# Patient Record
Sex: Female | Born: 1950 | Race: White | Hispanic: No | Smoking: Current every day smoker
Health system: Southern US, Community
[De-identification: ages and names within clinical notes are randomized; demographics above are authoritative.]

## PROBLEM LIST (undated history)

## (undated) ENCOUNTER — Emergency Department (HOSPITAL_COMMUNITY): Admission: EM | Payer: Medicare Other | Source: Home / Self Care

## (undated) DIAGNOSIS — Z22322 Carrier or suspected carrier of Methicillin resistant Staphylococcus aureus: Secondary | ICD-10-CM

## (undated) DIAGNOSIS — G4700 Insomnia, unspecified: Secondary | ICD-10-CM

## (undated) DIAGNOSIS — J410 Simple chronic bronchitis: Secondary | ICD-10-CM

## (undated) DIAGNOSIS — Z972 Presence of dental prosthetic device (complete) (partial): Secondary | ICD-10-CM

## (undated) DIAGNOSIS — R3915 Urgency of urination: Secondary | ICD-10-CM

## (undated) DIAGNOSIS — IMO0002 Reserved for concepts with insufficient information to code with codable children: Secondary | ICD-10-CM

## (undated) DIAGNOSIS — F419 Anxiety disorder, unspecified: Secondary | ICD-10-CM

## (undated) DIAGNOSIS — Z789 Other specified health status: Secondary | ICD-10-CM

## (undated) DIAGNOSIS — F1410 Cocaine abuse, uncomplicated: Secondary | ICD-10-CM

## (undated) DIAGNOSIS — K219 Gastro-esophageal reflux disease without esophagitis: Secondary | ICD-10-CM

## (undated) DIAGNOSIS — F329 Major depressive disorder, single episode, unspecified: Secondary | ICD-10-CM

## (undated) DIAGNOSIS — K259 Gastric ulcer, unspecified as acute or chronic, without hemorrhage or perforation: Secondary | ICD-10-CM

## (undated) DIAGNOSIS — K5730 Diverticulosis of large intestine without perforation or abscess without bleeding: Secondary | ICD-10-CM

## (undated) DIAGNOSIS — F39 Unspecified mood [affective] disorder: Secondary | ICD-10-CM

## (undated) DIAGNOSIS — R05 Cough: Secondary | ICD-10-CM

## (undated) DIAGNOSIS — M1990 Unspecified osteoarthritis, unspecified site: Secondary | ICD-10-CM

## (undated) DIAGNOSIS — J449 Chronic obstructive pulmonary disease, unspecified: Secondary | ICD-10-CM

## (undated) DIAGNOSIS — E785 Hyperlipidemia, unspecified: Secondary | ICD-10-CM

## (undated) DIAGNOSIS — F32A Depression, unspecified: Secondary | ICD-10-CM

## (undated) DIAGNOSIS — R300 Dysuria: Secondary | ICD-10-CM

## (undated) DIAGNOSIS — J189 Pneumonia, unspecified organism: Secondary | ICD-10-CM

## (undated) DIAGNOSIS — M87051 Idiopathic aseptic necrosis of right femur: Secondary | ICD-10-CM

## (undated) DIAGNOSIS — K2270 Barrett's esophagus without dysplasia: Secondary | ICD-10-CM

## (undated) DIAGNOSIS — R0981 Nasal congestion: Secondary | ICD-10-CM

## (undated) DIAGNOSIS — N3010 Interstitial cystitis (chronic) without hematuria: Secondary | ICD-10-CM

## (undated) DIAGNOSIS — G2581 Restless legs syndrome: Secondary | ICD-10-CM

## (undated) DIAGNOSIS — R351 Nocturia: Secondary | ICD-10-CM

## (undated) DIAGNOSIS — F1911 Other psychoactive substance abuse, in remission: Secondary | ICD-10-CM

## (undated) DIAGNOSIS — T884XXA Failed or difficult intubation, initial encounter: Secondary | ICD-10-CM

## (undated) DIAGNOSIS — Z0282 Encounter for adoption services: Secondary | ICD-10-CM

## (undated) DIAGNOSIS — E7800 Pure hypercholesterolemia, unspecified: Secondary | ICD-10-CM

## (undated) DIAGNOSIS — Z8719 Personal history of other diseases of the digestive system: Secondary | ICD-10-CM

## (undated) DIAGNOSIS — T4145XA Adverse effect of unspecified anesthetic, initial encounter: Secondary | ICD-10-CM

## (undated) DIAGNOSIS — R102 Pelvic and perineal pain: Secondary | ICD-10-CM

## (undated) DIAGNOSIS — R569 Unspecified convulsions: Secondary | ICD-10-CM

## (undated) DIAGNOSIS — R0600 Dyspnea, unspecified: Secondary | ICD-10-CM

## (undated) DIAGNOSIS — Z8711 Personal history of peptic ulcer disease: Secondary | ICD-10-CM

## (undated) DIAGNOSIS — Z915 Personal history of self-harm: Secondary | ICD-10-CM

## (undated) DIAGNOSIS — G40909 Epilepsy, unspecified, not intractable, without status epilepticus: Secondary | ICD-10-CM

## (undated) DIAGNOSIS — F1011 Alcohol abuse, in remission: Secondary | ICD-10-CM

## (undated) DIAGNOSIS — R058 Other specified cough: Secondary | ICD-10-CM

## (undated) DIAGNOSIS — Z87828 Personal history of other (healed) physical injury and trauma: Secondary | ICD-10-CM

## (undated) DIAGNOSIS — R350 Frequency of micturition: Secondary | ICD-10-CM

## (undated) DIAGNOSIS — J069 Acute upper respiratory infection, unspecified: Secondary | ICD-10-CM

## (undated) DIAGNOSIS — Z8614 Personal history of Methicillin resistant Staphylococcus aureus infection: Secondary | ICD-10-CM

## (undated) HISTORY — PX: INCISION AND DRAINAGE: SHX5863

## (undated) HISTORY — PX: MULTIPLE TOOTH EXTRACTIONS: SHX2053

## (undated) HISTORY — DX: Acute upper respiratory infection, unspecified: J06.9

## (undated) HISTORY — DX: Anxiety disorder, unspecified: F41.9

## (undated) HISTORY — PX: INTERSTIM IMPLANT REMOVAL: SHX5131

## (undated) HISTORY — DX: Gastro-esophageal reflux disease without esophagitis: K21.9

## (undated) HISTORY — DX: Pneumonia, unspecified organism: J18.9

## (undated) HISTORY — DX: Restless legs syndrome: G25.81

## (undated) HISTORY — PX: JOINT REPLACEMENT: SHX530

## (undated) HISTORY — PX: OTHER SURGICAL HISTORY: SHX169

## (undated) HISTORY — PX: SALPINGOOPHORECTOMY: SHX82

## (undated) HISTORY — PX: VAGINAL HYSTERECTOMY: SUR661

## (undated) HISTORY — PX: SINOSCOPY: SHX187

## (undated) HISTORY — PX: NEUROPLASTY BRACHIAL PLEXUS: SUR896

## (undated) HISTORY — PX: NASAL SEPTUM SURGERY: SHX37

---

## 1977-04-18 DIAGNOSIS — Z87828 Personal history of other (healed) physical injury and trauma: Secondary | ICD-10-CM

## 1977-04-18 HISTORY — DX: Personal history of other (healed) physical injury and trauma: Z87.828

## 1988-04-18 DIAGNOSIS — F519 Sleep disorder not due to a substance or known physiological condition, unspecified: Secondary | ICD-10-CM | POA: Insufficient documentation

## 1997-07-14 ENCOUNTER — Ambulatory Visit (HOSPITAL_COMMUNITY): Admission: RE | Admit: 1997-07-14 | Discharge: 1997-07-14 | Payer: Self-pay | Admitting: *Deleted

## 1997-08-29 ENCOUNTER — Other Ambulatory Visit: Admission: RE | Admit: 1997-08-29 | Discharge: 1997-08-29 | Payer: Self-pay | Admitting: *Deleted

## 1997-10-01 ENCOUNTER — Ambulatory Visit: Admission: RE | Admit: 1997-10-01 | Discharge: 1997-10-01 | Payer: Self-pay | Admitting: *Deleted

## 1997-10-12 ENCOUNTER — Inpatient Hospital Stay (HOSPITAL_COMMUNITY): Admission: AD | Admit: 1997-10-12 | Discharge: 1997-10-12 | Payer: Self-pay | Admitting: *Deleted

## 1997-12-31 ENCOUNTER — Observation Stay (HOSPITAL_COMMUNITY): Admission: RE | Admit: 1997-12-31 | Discharge: 1998-01-01 | Payer: Self-pay | Admitting: *Deleted

## 1998-05-15 ENCOUNTER — Other Ambulatory Visit: Admission: RE | Admit: 1998-05-15 | Discharge: 1998-05-15 | Payer: Self-pay | Admitting: *Deleted

## 1998-07-03 ENCOUNTER — Ambulatory Visit (HOSPITAL_COMMUNITY): Admission: RE | Admit: 1998-07-03 | Discharge: 1998-07-03 | Payer: Self-pay | Admitting: Psychiatry

## 1998-07-15 ENCOUNTER — Ambulatory Visit (HOSPITAL_COMMUNITY): Admission: RE | Admit: 1998-07-15 | Discharge: 1998-07-15 | Payer: Self-pay | Admitting: *Deleted

## 1998-07-15 ENCOUNTER — Encounter: Payer: Self-pay | Admitting: *Deleted

## 1998-08-26 ENCOUNTER — Emergency Department (HOSPITAL_COMMUNITY): Admission: EM | Admit: 1998-08-26 | Discharge: 1998-08-26 | Payer: Self-pay | Admitting: Emergency Medicine

## 1999-01-14 ENCOUNTER — Inpatient Hospital Stay (HOSPITAL_COMMUNITY): Admission: EM | Admit: 1999-01-14 | Discharge: 1999-01-14 | Payer: Self-pay | Admitting: Family Medicine

## 2000-11-08 ENCOUNTER — Encounter: Payer: Self-pay | Admitting: Emergency Medicine

## 2000-11-08 ENCOUNTER — Emergency Department (HOSPITAL_COMMUNITY): Admission: EM | Admit: 2000-11-08 | Discharge: 2000-11-08 | Payer: Self-pay | Admitting: Emergency Medicine

## 2001-01-09 ENCOUNTER — Emergency Department (HOSPITAL_COMMUNITY): Admission: EM | Admit: 2001-01-09 | Discharge: 2001-01-09 | Payer: Self-pay | Admitting: Emergency Medicine

## 2001-01-09 ENCOUNTER — Encounter: Payer: Self-pay | Admitting: Emergency Medicine

## 2001-01-22 ENCOUNTER — Emergency Department (HOSPITAL_COMMUNITY): Admission: EM | Admit: 2001-01-22 | Discharge: 2001-01-22 | Payer: Self-pay

## 2002-08-04 ENCOUNTER — Emergency Department (HOSPITAL_COMMUNITY): Admission: EM | Admit: 2002-08-04 | Discharge: 2002-08-04 | Payer: Self-pay | Admitting: Emergency Medicine

## 2002-11-01 ENCOUNTER — Emergency Department (HOSPITAL_COMMUNITY): Admission: EM | Admit: 2002-11-01 | Discharge: 2002-11-01 | Payer: Self-pay | Admitting: Emergency Medicine

## 2002-11-02 ENCOUNTER — Emergency Department (HOSPITAL_COMMUNITY): Admission: EM | Admit: 2002-11-02 | Discharge: 2002-11-02 | Payer: Self-pay | Admitting: Emergency Medicine

## 2002-11-03 ENCOUNTER — Inpatient Hospital Stay (HOSPITAL_COMMUNITY): Admission: EM | Admit: 2002-11-03 | Discharge: 2002-11-07 | Payer: Self-pay | Admitting: Emergency Medicine

## 2002-11-03 ENCOUNTER — Encounter: Payer: Self-pay | Admitting: Family Medicine

## 2002-11-12 ENCOUNTER — Emergency Department (HOSPITAL_COMMUNITY): Admission: EM | Admit: 2002-11-12 | Discharge: 2002-11-12 | Payer: Self-pay | Admitting: Emergency Medicine

## 2003-07-13 ENCOUNTER — Emergency Department (HOSPITAL_COMMUNITY): Admission: EM | Admit: 2003-07-13 | Discharge: 2003-07-13 | Payer: Self-pay | Admitting: Emergency Medicine

## 2003-07-14 ENCOUNTER — Emergency Department (HOSPITAL_COMMUNITY): Admission: EM | Admit: 2003-07-14 | Discharge: 2003-07-14 | Payer: Self-pay | Admitting: Emergency Medicine

## 2003-12-31 ENCOUNTER — Ambulatory Visit: Payer: Self-pay | Admitting: Family Medicine

## 2004-01-13 ENCOUNTER — Ambulatory Visit: Payer: Self-pay | Admitting: Family Medicine

## 2004-01-13 ENCOUNTER — Encounter (INDEPENDENT_AMBULATORY_CARE_PROVIDER_SITE_OTHER): Payer: Self-pay | Admitting: Internal Medicine

## 2004-01-13 LAB — CONVERTED CEMR LAB
HCT: 46.8 %
Hemoglobin: 15.9 g/dL

## 2004-02-08 ENCOUNTER — Emergency Department (HOSPITAL_COMMUNITY): Admission: EM | Admit: 2004-02-08 | Discharge: 2004-02-08 | Payer: Self-pay | Admitting: Emergency Medicine

## 2004-02-11 ENCOUNTER — Emergency Department (HOSPITAL_COMMUNITY): Admission: EM | Admit: 2004-02-11 | Discharge: 2004-02-11 | Payer: Self-pay | Admitting: Emergency Medicine

## 2004-02-18 ENCOUNTER — Emergency Department (HOSPITAL_COMMUNITY): Admission: EM | Admit: 2004-02-18 | Discharge: 2004-02-18 | Payer: Self-pay | Admitting: Emergency Medicine

## 2004-02-25 ENCOUNTER — Emergency Department (HOSPITAL_COMMUNITY): Admission: EM | Admit: 2004-02-25 | Discharge: 2004-02-25 | Payer: Self-pay | Admitting: Family Medicine

## 2004-04-06 ENCOUNTER — Encounter (INDEPENDENT_AMBULATORY_CARE_PROVIDER_SITE_OTHER): Payer: Self-pay | Admitting: *Deleted

## 2004-04-06 ENCOUNTER — Ambulatory Visit (HOSPITAL_COMMUNITY): Admission: RE | Admit: 2004-04-06 | Discharge: 2004-04-06 | Payer: Self-pay | Admitting: Gastroenterology

## 2004-05-05 ENCOUNTER — Encounter (INDEPENDENT_AMBULATORY_CARE_PROVIDER_SITE_OTHER): Payer: Self-pay | Admitting: Specialist

## 2004-05-05 ENCOUNTER — Ambulatory Visit (HOSPITAL_COMMUNITY): Admission: RE | Admit: 2004-05-05 | Discharge: 2004-05-05 | Payer: Self-pay | Admitting: Gastroenterology

## 2004-09-17 ENCOUNTER — Emergency Department (HOSPITAL_COMMUNITY): Admission: EM | Admit: 2004-09-17 | Discharge: 2004-09-17 | Payer: Self-pay | Admitting: Emergency Medicine

## 2004-10-25 ENCOUNTER — Emergency Department (HOSPITAL_COMMUNITY): Admission: EM | Admit: 2004-10-25 | Discharge: 2004-10-25 | Payer: Self-pay | Admitting: Emergency Medicine

## 2004-10-28 ENCOUNTER — Emergency Department (HOSPITAL_COMMUNITY): Admission: EM | Admit: 2004-10-28 | Discharge: 2004-10-28 | Payer: Self-pay | Admitting: Emergency Medicine

## 2005-02-16 ENCOUNTER — Emergency Department (HOSPITAL_COMMUNITY): Admission: EM | Admit: 2005-02-16 | Discharge: 2005-02-16 | Payer: Self-pay | Admitting: Emergency Medicine

## 2005-05-02 ENCOUNTER — Emergency Department (HOSPITAL_COMMUNITY): Admission: EM | Admit: 2005-05-02 | Discharge: 2005-05-02 | Payer: Self-pay | Admitting: Emergency Medicine

## 2005-05-05 ENCOUNTER — Emergency Department (HOSPITAL_COMMUNITY): Admission: EM | Admit: 2005-05-05 | Discharge: 2005-05-05 | Payer: Self-pay | Admitting: Emergency Medicine

## 2005-08-29 ENCOUNTER — Ambulatory Visit: Payer: Self-pay | Admitting: Internal Medicine

## 2005-09-09 ENCOUNTER — Ambulatory Visit: Payer: Self-pay | Admitting: Internal Medicine

## 2005-09-09 ENCOUNTER — Encounter: Payer: Self-pay | Admitting: Internal Medicine

## 2005-09-09 ENCOUNTER — Encounter (INDEPENDENT_AMBULATORY_CARE_PROVIDER_SITE_OTHER): Payer: Self-pay | Admitting: Internal Medicine

## 2005-09-09 LAB — CONVERTED CEMR LAB: Pap Smear: NORMAL

## 2005-10-04 ENCOUNTER — Ambulatory Visit (HOSPITAL_COMMUNITY): Admission: RE | Admit: 2005-10-04 | Discharge: 2005-10-04 | Payer: Self-pay | Admitting: Family Medicine

## 2005-10-05 ENCOUNTER — Emergency Department (HOSPITAL_COMMUNITY): Admission: EM | Admit: 2005-10-05 | Discharge: 2005-10-05 | Payer: Self-pay | Admitting: Emergency Medicine

## 2005-10-27 ENCOUNTER — Ambulatory Visit: Payer: Self-pay | Admitting: Family Medicine

## 2006-01-11 ENCOUNTER — Ambulatory Visit (HOSPITAL_BASED_OUTPATIENT_CLINIC_OR_DEPARTMENT_OTHER): Admission: RE | Admit: 2006-01-11 | Discharge: 2006-01-11 | Payer: Self-pay | Admitting: Urology

## 2006-02-13 ENCOUNTER — Emergency Department (HOSPITAL_COMMUNITY): Admission: EM | Admit: 2006-02-13 | Discharge: 2006-02-13 | Payer: Self-pay | Admitting: Emergency Medicine

## 2006-02-16 ENCOUNTER — Emergency Department (HOSPITAL_COMMUNITY): Admission: EM | Admit: 2006-02-16 | Discharge: 2006-02-16 | Payer: Self-pay | Admitting: Emergency Medicine

## 2006-05-29 ENCOUNTER — Ambulatory Visit: Payer: Self-pay | Admitting: Internal Medicine

## 2006-05-29 ENCOUNTER — Encounter (INDEPENDENT_AMBULATORY_CARE_PROVIDER_SITE_OTHER): Payer: Self-pay | Admitting: Internal Medicine

## 2006-05-29 LAB — CONVERTED CEMR LAB
Cholesterol: 239 mg/dL
Direct LDL: 160 mg/dL
HDL: 60 mg/dL
LDL Cholesterol: 125 mg/dL
Triglycerides: 268 mg/dL

## 2006-06-03 ENCOUNTER — Emergency Department (HOSPITAL_COMMUNITY): Admission: EM | Admit: 2006-06-03 | Discharge: 2006-06-03 | Payer: Self-pay | Admitting: Family Medicine

## 2006-06-13 ENCOUNTER — Emergency Department (HOSPITAL_COMMUNITY): Admission: EM | Admit: 2006-06-13 | Discharge: 2006-06-13 | Payer: Self-pay | Admitting: Emergency Medicine

## 2006-07-25 ENCOUNTER — Ambulatory Visit: Payer: Self-pay | Admitting: Family Medicine

## 2006-08-08 ENCOUNTER — Ambulatory Visit: Payer: Self-pay | Admitting: Family Medicine

## 2006-09-10 ENCOUNTER — Encounter (INDEPENDENT_AMBULATORY_CARE_PROVIDER_SITE_OTHER): Payer: Self-pay | Admitting: Internal Medicine

## 2006-09-10 LAB — CONVERTED CEMR LAB: Creatinine, Ser: 0.8 mg/dL

## 2006-10-02 ENCOUNTER — Ambulatory Visit: Payer: Self-pay | Admitting: Internal Medicine

## 2006-10-20 ENCOUNTER — Emergency Department (HOSPITAL_COMMUNITY): Admission: EM | Admit: 2006-10-20 | Discharge: 2006-10-20 | Payer: Self-pay | Admitting: Emergency Medicine

## 2006-12-11 ENCOUNTER — Encounter (INDEPENDENT_AMBULATORY_CARE_PROVIDER_SITE_OTHER): Payer: Self-pay | Admitting: Internal Medicine

## 2006-12-13 ENCOUNTER — Ambulatory Visit: Payer: Self-pay | Admitting: Internal Medicine

## 2006-12-13 DIAGNOSIS — N3010 Interstitial cystitis (chronic) without hematuria: Secondary | ICD-10-CM | POA: Insufficient documentation

## 2006-12-13 DIAGNOSIS — E782 Mixed hyperlipidemia: Secondary | ICD-10-CM | POA: Insufficient documentation

## 2006-12-13 DIAGNOSIS — F32A Depression, unspecified: Secondary | ICD-10-CM | POA: Insufficient documentation

## 2006-12-13 DIAGNOSIS — F329 Major depressive disorder, single episode, unspecified: Secondary | ICD-10-CM

## 2006-12-15 DIAGNOSIS — F1021 Alcohol dependence, in remission: Secondary | ICD-10-CM | POA: Insufficient documentation

## 2006-12-15 DIAGNOSIS — M5137 Other intervertebral disc degeneration, lumbosacral region: Secondary | ICD-10-CM | POA: Insufficient documentation

## 2006-12-15 DIAGNOSIS — Z860100 Personal history of colon polyps, unspecified: Secondary | ICD-10-CM | POA: Insufficient documentation

## 2006-12-15 DIAGNOSIS — K219 Gastro-esophageal reflux disease without esophagitis: Secondary | ICD-10-CM | POA: Insufficient documentation

## 2006-12-15 DIAGNOSIS — K5730 Diverticulosis of large intestine without perforation or abscess without bleeding: Secondary | ICD-10-CM | POA: Insufficient documentation

## 2006-12-15 DIAGNOSIS — J301 Allergic rhinitis due to pollen: Secondary | ICD-10-CM | POA: Insufficient documentation

## 2006-12-15 DIAGNOSIS — M51379 Other intervertebral disc degeneration, lumbosacral region without mention of lumbar back pain or lower extremity pain: Secondary | ICD-10-CM | POA: Insufficient documentation

## 2006-12-15 DIAGNOSIS — F17200 Nicotine dependence, unspecified, uncomplicated: Secondary | ICD-10-CM | POA: Insufficient documentation

## 2006-12-15 DIAGNOSIS — Z8719 Personal history of other diseases of the digestive system: Secondary | ICD-10-CM | POA: Insufficient documentation

## 2007-01-10 ENCOUNTER — Telehealth (INDEPENDENT_AMBULATORY_CARE_PROVIDER_SITE_OTHER): Payer: Self-pay | Admitting: Internal Medicine

## 2007-01-12 ENCOUNTER — Ambulatory Visit: Payer: Self-pay | Admitting: Internal Medicine

## 2007-01-17 ENCOUNTER — Emergency Department (HOSPITAL_COMMUNITY): Admission: EM | Admit: 2007-01-17 | Discharge: 2007-01-17 | Payer: Self-pay | Admitting: Emergency Medicine

## 2007-01-17 ENCOUNTER — Encounter (INDEPENDENT_AMBULATORY_CARE_PROVIDER_SITE_OTHER): Payer: Self-pay | Admitting: Internal Medicine

## 2007-01-21 ENCOUNTER — Telehealth (INDEPENDENT_AMBULATORY_CARE_PROVIDER_SITE_OTHER): Payer: Self-pay | Admitting: Internal Medicine

## 2007-01-22 LAB — CONVERTED CEMR LAB
ALT: 9 U/L
AST: 13 U/L
Albumin: 4.2 g/dL
Alkaline Phosphatase: 59 U/L
Bilirubin, Direct: 0.2 mg/dL
Cholesterol: 161 mg/dL
HDL: 62 mg/dL
Indirect Bilirubin: 0.2 mg/dL
LDL Cholesterol: 68 mg/dL
Total Bilirubin: 0.4 mg/dL
Total CHOL/HDL Ratio: 2.6
Total Protein: 6.7 g/dL
Triglycerides: 153 mg/dL — ABNORMAL HIGH
VLDL: 31 mg/dL

## 2007-02-06 ENCOUNTER — Encounter (INDEPENDENT_AMBULATORY_CARE_PROVIDER_SITE_OTHER): Payer: Self-pay | Admitting: Internal Medicine

## 2007-02-09 ENCOUNTER — Ambulatory Visit: Payer: Self-pay | Admitting: Internal Medicine

## 2007-02-09 DIAGNOSIS — N644 Mastodynia: Secondary | ICD-10-CM | POA: Insufficient documentation

## 2007-02-13 ENCOUNTER — Encounter (INDEPENDENT_AMBULATORY_CARE_PROVIDER_SITE_OTHER): Payer: Self-pay | Admitting: Internal Medicine

## 2007-02-14 ENCOUNTER — Ambulatory Visit (HOSPITAL_BASED_OUTPATIENT_CLINIC_OR_DEPARTMENT_OTHER): Admission: RE | Admit: 2007-02-14 | Discharge: 2007-02-14 | Payer: Self-pay | Admitting: Urology

## 2007-03-06 ENCOUNTER — Emergency Department (HOSPITAL_COMMUNITY): Admission: EM | Admit: 2007-03-06 | Discharge: 2007-03-06 | Payer: Self-pay | Admitting: Emergency Medicine

## 2007-03-07 ENCOUNTER — Telehealth (INDEPENDENT_AMBULATORY_CARE_PROVIDER_SITE_OTHER): Payer: Self-pay | Admitting: Internal Medicine

## 2007-03-08 ENCOUNTER — Emergency Department (HOSPITAL_COMMUNITY): Admission: EM | Admit: 2007-03-08 | Discharge: 2007-03-08 | Payer: Self-pay | Admitting: Emergency Medicine

## 2007-04-03 ENCOUNTER — Ambulatory Visit: Payer: Self-pay | Admitting: Internal Medicine

## 2007-04-03 DIAGNOSIS — L723 Sebaceous cyst: Secondary | ICD-10-CM | POA: Insufficient documentation

## 2007-04-03 DIAGNOSIS — L821 Other seborrheic keratosis: Secondary | ICD-10-CM | POA: Insufficient documentation

## 2007-04-06 ENCOUNTER — Telehealth (INDEPENDENT_AMBULATORY_CARE_PROVIDER_SITE_OTHER): Payer: Self-pay | Admitting: Internal Medicine

## 2007-04-10 ENCOUNTER — Encounter: Admission: RE | Admit: 2007-04-10 | Discharge: 2007-04-10 | Payer: Self-pay | Admitting: Obstetrics & Gynecology

## 2007-04-23 ENCOUNTER — Telehealth (INDEPENDENT_AMBULATORY_CARE_PROVIDER_SITE_OTHER): Payer: Self-pay | Admitting: Internal Medicine

## 2007-04-30 ENCOUNTER — Encounter (INDEPENDENT_AMBULATORY_CARE_PROVIDER_SITE_OTHER): Payer: Self-pay | Admitting: Nurse Practitioner

## 2007-05-10 ENCOUNTER — Encounter (INDEPENDENT_AMBULATORY_CARE_PROVIDER_SITE_OTHER): Payer: Self-pay | Admitting: Nurse Practitioner

## 2007-05-18 ENCOUNTER — Telehealth (INDEPENDENT_AMBULATORY_CARE_PROVIDER_SITE_OTHER): Payer: Self-pay | Admitting: *Deleted

## 2007-05-21 ENCOUNTER — Encounter (INDEPENDENT_AMBULATORY_CARE_PROVIDER_SITE_OTHER): Payer: Self-pay | Admitting: Nurse Practitioner

## 2007-05-24 ENCOUNTER — Encounter (INDEPENDENT_AMBULATORY_CARE_PROVIDER_SITE_OTHER): Payer: Self-pay | Admitting: *Deleted

## 2007-07-03 ENCOUNTER — Ambulatory Visit (HOSPITAL_BASED_OUTPATIENT_CLINIC_OR_DEPARTMENT_OTHER): Admission: RE | Admit: 2007-07-03 | Discharge: 2007-07-03 | Payer: Self-pay | Admitting: Urology

## 2007-07-17 ENCOUNTER — Ambulatory Visit (HOSPITAL_BASED_OUTPATIENT_CLINIC_OR_DEPARTMENT_OTHER): Admission: RE | Admit: 2007-07-17 | Discharge: 2007-07-17 | Payer: Self-pay | Admitting: Urology

## 2007-08-02 ENCOUNTER — Emergency Department (HOSPITAL_COMMUNITY): Admission: EM | Admit: 2007-08-02 | Discharge: 2007-08-02 | Payer: Self-pay | Admitting: Emergency Medicine

## 2008-02-14 ENCOUNTER — Inpatient Hospital Stay (HOSPITAL_COMMUNITY): Admission: EM | Admit: 2008-02-14 | Discharge: 2008-02-18 | Payer: Self-pay | Admitting: Emergency Medicine

## 2008-02-17 ENCOUNTER — Encounter (INDEPENDENT_AMBULATORY_CARE_PROVIDER_SITE_OTHER): Payer: Self-pay | Admitting: Internal Medicine

## 2008-02-18 ENCOUNTER — Ambulatory Visit: Payer: Self-pay | Admitting: Vascular Surgery

## 2008-02-18 ENCOUNTER — Encounter (INDEPENDENT_AMBULATORY_CARE_PROVIDER_SITE_OTHER): Payer: Self-pay | Admitting: Internal Medicine

## 2008-02-21 ENCOUNTER — Emergency Department (HOSPITAL_COMMUNITY): Admission: EM | Admit: 2008-02-21 | Discharge: 2008-02-22 | Payer: Self-pay | Admitting: Emergency Medicine

## 2008-03-03 ENCOUNTER — Ambulatory Visit (HOSPITAL_BASED_OUTPATIENT_CLINIC_OR_DEPARTMENT_OTHER): Admission: RE | Admit: 2008-03-03 | Discharge: 2008-03-03 | Payer: Self-pay | Admitting: Urology

## 2008-12-31 ENCOUNTER — Encounter: Admission: RE | Admit: 2008-12-31 | Discharge: 2008-12-31 | Payer: Self-pay | Admitting: Family Medicine

## 2009-05-01 ENCOUNTER — Emergency Department (HOSPITAL_COMMUNITY): Admission: EM | Admit: 2009-05-01 | Discharge: 2009-05-01 | Payer: Self-pay | Admitting: Emergency Medicine

## 2009-05-26 ENCOUNTER — Emergency Department (HOSPITAL_COMMUNITY): Admission: EM | Admit: 2009-05-26 | Discharge: 2009-05-26 | Payer: Self-pay | Admitting: Emergency Medicine

## 2009-09-19 ENCOUNTER — Emergency Department (HOSPITAL_COMMUNITY): Admission: EM | Admit: 2009-09-19 | Discharge: 2009-09-19 | Payer: Self-pay | Admitting: Emergency Medicine

## 2009-11-05 ENCOUNTER — Emergency Department (HOSPITAL_COMMUNITY): Admission: EM | Admit: 2009-11-05 | Discharge: 2009-11-05 | Payer: Self-pay | Admitting: Emergency Medicine

## 2009-11-07 ENCOUNTER — Emergency Department (HOSPITAL_COMMUNITY): Admission: EM | Admit: 2009-11-07 | Discharge: 2009-11-07 | Payer: Self-pay | Admitting: Emergency Medicine

## 2009-12-10 ENCOUNTER — Emergency Department (HOSPITAL_COMMUNITY): Admission: EM | Admit: 2009-12-10 | Discharge: 2009-12-10 | Payer: Self-pay | Admitting: Emergency Medicine

## 2009-12-20 ENCOUNTER — Emergency Department (HOSPITAL_COMMUNITY): Admission: EM | Admit: 2009-12-20 | Discharge: 2009-12-20 | Payer: Self-pay | Admitting: Emergency Medicine

## 2010-01-31 ENCOUNTER — Emergency Department (HOSPITAL_COMMUNITY): Admission: EM | Admit: 2010-01-31 | Discharge: 2010-01-31 | Payer: Self-pay | Admitting: Emergency Medicine

## 2010-04-18 HISTORY — PX: CATARACT EXTRACTION W/ INTRAOCULAR LENS  IMPLANT, BILATERAL: SHX1307

## 2010-05-09 ENCOUNTER — Encounter: Payer: Self-pay | Admitting: Obstetrics & Gynecology

## 2010-05-18 NOTE — Letter (Signed)
Summary: influenza vaccination  influenza vaccination   Imported By: Arta Bruce 02/13/2007 15:02:18  _____________________________________________________________________  External Attachment:    Type:   Image     Comment:   External Document

## 2010-05-18 NOTE — Letter (Signed)
Summary: TRIAGE CALL  TRIAGE CALL   Imported By: Arta Bruce 05/14/2007 09:24:05  _____________________________________________________________________  External Attachment:    Type:   Image     Comment:   External Document

## 2010-05-18 NOTE — Progress Notes (Signed)
  Phone Note Call from Patient   Summary of Call: On call triage nurse took call from pt.  for cough-call pt. and see if needs OV still--if still having symptoms. Initial call taken by: Julieanne Manson MD,  January 21, 2007 10:05 AM  Follow-up for Phone Call        Shanda Bumps spoke with pt.  She stated she is better.  She got a Z-pack and cough med from her doctor, Dr. Lloyd Huger.  She talked for about 20 minutes about how she was unhappy here at [facility] and she wanted her pain med refilled again.  Unable to fill as just got last week.  Pt has appt with Dr. Delrae Alfred on 10/24, encouraged her to keep that appt to address any concerns she has Follow-up by: Vesta Mixer CMA,  January 24, 2007 11:28 AM

## 2010-05-18 NOTE — Progress Notes (Signed)
Summary: cholesterol refill  Phone Note Call from Patient   Summary of Call: pt requesting refill of her cholesterol med that she forgot to ask for at her appt and did not want appt for derm.  Pt was quite rude on the phone. Initial call taken by: Vesta Mixer CMA,  April 06, 2007 4:40 PM  Follow-up for Phone Call        Patient speaking rudely to office staff--was also called to get set up with surgical consult, which she requested and pt. said she didn't want that, just wanted statin filled.  I called pt. back and discussed with her that her rudeness toward the staff was not tolerable--particularly since we were trying to set her up for something she requested.  Referral cancelled.  Will send in Rx. Follow-up by: Julieanne Manson MD,  April 06, 2007 6:10 PM  Additional Follow-up for Phone Call Additional follow up Details #1::        med called to [facility] pharmacy Additional Follow-up by: Vesta Mixer CMA,  April 09, 2007 12:00 PM      Prescriptions: SIMVASTATIN 20 MG  TABS (SIMVASTATIN) 1 tab by mouth daily  #30 x 4   Entered and Authorized by:   Julieanne Manson MD   Signed by:   Julieanne Manson MD on 04/06/2007   Method used:   Printed then faxed to ...         RxID:   [id number]

## 2010-05-18 NOTE — Letter (Signed)
Summary: [facility]  [facility]   Imported By: Arta Bruce 02/14/2007 10:50:56  _____________________________________________________________________  External Attachment:    Type:   Image     Comment:   External Document

## 2010-05-18 NOTE — Medication Information (Signed)
Summary: refill from dr. Reche Dixon  refill from dr. Reche Dixon   Imported By: Arta Bruce 01/10/2007 16:07:55  _____________________________________________________________________  External Attachment:    Type:   Image     Comment:   External Document

## 2010-05-18 NOTE — Miscellaneous (Signed)
Summary: f/u on [name]  Clinical Lists Changes  Medications: Added new medication of NEURONTIN 300 MG  CAPS (GABAPENTIN) 1 cap by mouth at bedtime for 3 days.  Increase by 300 mg q 3 days until reach dose of 600 mg two times a day and maintain. - Signed Rx of NEURONTIN 300 MG  CAPS (GABAPENTIN) 1 cap by mouth at bedtime for 3 days.  Increase by 300 mg q 3 days until reach dose of 600 mg two times a day and maintain.;  #120 x 2;  Signed;  Entered by: Julieanne Manson MD;  Authorized by: Julieanne Manson MD;  Method used: Print then Give to Patient    Prescriptions: NEURONTIN 300 MG  CAPS (GABAPENTIN) 1 cap by mouth at bedtime for 3 days.  Increase by 300 mg q 3 days until reach dose of 600 mg two times a day and maintain.  #120 x 2   Entered and Authorized by:   Julieanne Manson MD   Signed by:   Julieanne Manson MD on 02/13/2007   Method used:   Print then Give to Patient   RxID:   [id number]  After review of literature for interstitial cystitis, have found that Neurontin has been  helpful.  Will initiate pt. on Neurontin at 300 mg q hs for 3 days and increase by 300 mg q3 days to max of 600 mg two times a day. To follow up in 2 months.  Called pt. and left message to call for the information and to let us know where to fax the prescription.    Spoke with pt she uses [facility] Veterinary surgeon, however,  she wants to wait on it for now.  She has an appt with Dr. Isabel Caprice (she was on her way to him as I spoke with her) she said she was able to schedule surgery.  She also wanted to check with him to make sure she had not been on Neurontin before since she has been on so many before. ..................................................................Marland KitchenElmarie Shiley McCoy CMA  February 14, 2007 9:24 AM

## 2010-05-18 NOTE — Letter (Signed)
Summary: triage call  triage call   Imported By: Arta Bruce 01/17/2007 09:43:44  _____________________________________________________________________  External Attachment:    Type:   Image     Comment:   External Document

## 2010-05-18 NOTE — Progress Notes (Signed)
  Phone Note Call from Patient Call back at [facility] Phone [phone]   Caller: Patient Summary of Call: The pt is aware of her appoitment on Friday, but she had pneumonia twice in the past.  She is not feeling well and is contantly coughting and has chest congestion; therefore, she is requesting if the problem persit to be referral for a chest x-rays. Initial call taken by: Manon Hilding,  January 10, 2007 2:41 PM  Follow-up for Phone Call        please keep appt on friday Follow-up by: Vesta Mixer CMA,  January 10, 2007 5:14 PM

## 2010-05-18 NOTE — Progress Notes (Signed)
Summary: DERM REF  Phone Note Call from Patient   Summary of Call: [name] PT. WENT TO [place] YESTERDAY FOR A CYST ON BACK OF HER NECK AND THEY LANCED IT, ALSO THEY SAY THAT SHE HAS A MOLE ON HER LEFT SHOULDER AND THEY WANT [place] TO DO A REFERRAL TO A DERMATOLOGIST FOR BOTH ISSUES. THEY DID NOT GIVE HER ANY ANTIBIOTICS FOR THE CYST INFECTION. Initial call taken by: Leodis Rains,  March 07, 2007 10:18 AM  Follow-up for Phone Call        pt scheudle for f/u 12/5

## 2010-05-18 NOTE — Progress Notes (Signed)
  Phone Note Call from Patient Call back at Home Phone [phone]   Caller: Patient Summary of Call: [name] told me that in the past Dr. Dutch Quint suggested her to go to pain clinic but Dr Reche Dixon told her that he can prenscribed her hydrocodone for pelvic pain.  She is requesting to be transfer to contiune seen Dr. Reche Dixon but for right now she needs Dr. Delrae Alfred write down a prnescription for her.   Dr. Delrae Alfred Initial call taken by: Manon Hilding,  April 23, 2007 11:45 AM  Follow-up for Phone Call        rx for pain meds printed and readyfor pick up however pt will need to transfer if she desires to continue  seeing Dr. Reche Dixon for this rx. Will send refill for cholesterol meds to pharmacy as there is a med request in her chart however she needs lipids and LFT's. none since 2/08. Follow-up by: Lehman Prom FNP,  April 23, 2007 1:31 PM  Additional Follow-up for Phone Call Additional follow up Details #1::        Rx reprinted to include letterhead Additional Follow-up by: Lehman Prom FNP,  April 23, 2007 2:58 PM    Additional Follow-up for Phone Call Additional follow up Details #2::    pt notifed of rx's being sent in. Follow-up by: Vesta Mixer CMA,  April 23, 2007 3:28 PM  New/Updated Medications: VICODIN 5-500 MG  TABS (HYDROCODONE-ACETAMINOPHEN) One tablet every 8 hrs as needed for pain SIMVASTATIN 20 MG  TABS (SIMVASTATIN) 1 tab by mouth nightly for cholesterol   Prescriptions: VICODIN 5-500 MG  TABS (HYDROCODONE-ACETAMINOPHEN) One tablet every 8 hrs as needed for pain  #90 x 0   Entered and Authorized by:   Lehman Prom FNP   Signed by:   Lehman Prom FNP on 04/23/2007   Method used:   Printed then faxed to ...         RxID:   [id number] SIMVASTATIN 20 MG  TABS (SIMVASTATIN) 1 tab by mouth nightly for cholesterol  #30 x 1   Entered and Authorized by:   Lehman Prom FNP   Signed by:   Lehman Prom FNP on 04/23/2007   Method used:    Printed then faxed to ...         RxID:   [id number] VICODIN 5-500 MG  TABS (HYDROCODONE-ACETAMINOPHEN) One tablet every 8 hrs prn pain  #90 x 0   Entered and Authorized by:   Lehman Prom FNP   Signed by:   Lehman Prom FNP on 04/23/2007   Method used:   Print then Give to Patient   RxID:   [id number]

## 2010-05-18 NOTE — Progress Notes (Signed)
Summary: vicodin refill  Phone Note From Pharmacy   Caller: [facility] [phone] phone Summary of Call: vicodin 5/500 # 90  refill fax [phone] or  Phone [phone] Initial call taken by: Vesta Mixer CMA,  May 18, 2007 5:05 PM  Follow-up for Phone Call        To call back for refill next Thursday--too early. Follow-up by: Julieanne Manson MD,  May 19, 2007 12:40 PM  Additional Follow-up for Phone Call Additional follow up Details #1::        Recieved request from pharmacy.  It states last fill date as 05-01-07.  Pt should be getting a month supply, so she will not be able to get refills until 05/31/07. notify pt.   Additional Follow-up by: Lehman Prom FNP,  May 23, 2007 10:08 AM    Additional Follow-up for Phone Call Additional follow up Details #2::    pharmacy did mess up pt's rx last month.  She only got #20 the first time, I called pharmacy and fixed the error and they gave her the other 70 to make out the whole 90. The actual day was 04/23/07 for her script.  [facility] ..................................................................Marland KitchenElmarie Shiley McCoy CMA  May 24, 2007 3:21 PM  Follow-up by: Vesta Mixer CMA,  May 24, 2007 11:45 AM  Additional Follow-up for Phone Call Additional follow up Details #3:: Details for Additional Follow-up Action Taken: Did speak with patient about her tone and disrespect towards staff. We will not tolerate this and she has been told before by provder as well. A behavioral Guideline sheet has been mailed to patient for her to sign and return to clinic by her next appt.  rx faxed by tiffany 05/24/07 Additional Follow-up by: Mikey College CMA,  May 24, 2007 3:28 PM    Prescriptions: VICODIN 5-500 MG  TABS (HYDROCODONE-ACETAMINOPHEN) One tablet every 8 hrs as needed for pain  #90 x 0   Entered and Authorized by:   Julieanne Manson MD   Signed by:   Julieanne Manson MD on 05/24/2007   Method  used:   Printed then faxed to ...         RxID:   [id number]

## 2010-05-18 NOTE — Letter (Signed)
Summary: Generic Letter  [facility]  [address] [place], [place] [address]   Phone: [phone]  Fax: [phone]    05/24/2007     [facility] [address] [place], [place]  [address]     Dear Ms. BERHE,   We want to continue providing you with the best healthcare possible but we can only due it with your help. We have enclosed a Behavioral Guidelines form for you to sign due to verbal abuse toward our staff members at [facility]. This is your warning that our office will not tolerate verbal abuse from patients. Please return by mail or bring to the office at your next appointment. We look forward in providing you the best healthcare in the future.    Sincerely,   Mikey College CMA [facility]

## 2010-05-18 NOTE — Medication Information (Signed)
Summary[facility]   Imported By: Arta Bruce 05/01/2007 09:17:13  _____________________________________________________________________  External Attachment:    Type:   Image     Comment:   External Document

## 2010-05-18 NOTE — Assessment & Plan Note (Signed)
Summary: 1 MONTH FU PER [facility]///KT   Vital Signs:  Patient Profile:   60 Years Old Female Weight:      137 pounds Temp:     98.9 degrees F Pulse rate:   84 / minute Pulse rhythm:   regular Resp:     18 per minute BP sitting:   118 / 72  (right arm) Cuff size:   regular  Pt. in pain?   yes    Location:   due to interstical cyctitis    Intensity:   6  Vitals Entered By: Vesta Mixer CMA (February 09, 2007 2:05 PM)              Is Patient Diabetic? No     Chief Complaint:  wants pain meds refilled and to meet Dr. Delrae Alfred.  History of Present Illness: 1.  Left breast tenderness--to be seen next Tuesday with Dr. Jennette Kettle, her gynecologist.  Noted this about a month ago after having yearly breast exam in his office.  She is not due for Mammogram until January.  Just wanted to bring to out attention . No axillary soreness.  No nipple discharge.  Has 2-3 servings of caffeine daily.  No chocolate.  No erythema or swelling.    2.  Interstitial cystitis:  Followed by Dr. Isabel Caprice.  Has microscopic hematuria.  Had urodynamics 2 months ago.  Not clear exactly what this showed.  Apparently, urine cytology was negative and renal ultrasound was normal.  Had repeat cystoscopy about 1 1/2 years ago per pt.  Planning to undergo hydraulic distention of bladder shortly based on pt's description.  Pt. describes urinary pressure and chronic pelvic pain.  Has been on Flomax, Vesicare, Elmiron  and ?Centura without improvement.   We do not have any of her urology records since 8/07  Current Allergies (reviewed today): ! HALDOL DECANOATE (HALOPERIDOL DECANOATE)    Risk Factors:  Tobacco use:  current    Cigarettes:  Yes -- 1 pack(s) per day    Physical Exam  General:     Well-developed,well-nourished,in no acute distress; alert,appropriate and cooperative throughout examination Lungs:     Normal respiratory effort, chest expands symmetrically. Lungs are clear to auscultation, no crackles or  wheezes. Heart:     Normal rate and regular rhythm. S1 and S2 normal without gallop, murmur, click, rub or other extra sounds. Abdomen:     Bowel sounds positive,abdomen soft and non-tender without masses, organomegaly or hernias noted.    Impression & Recommendations:  Problem # 1:  INTERSTITIAL CYSTITIS (ICD-595.1) Discussed with pt. do not feel hydrocodone is a good long term pain control for this problem.  Will fill one more month, but need Dr. Ellin Goodie records and recommendations.  To check literature to see if Lyrica an option. Also--when distracted, does not appear to have much pelvic discomfort on exam. Encouraged her to make simple changes like discontinuing caffeinated beverages and smoking  Problem # 2:  BREAST TENDERNESS (ICD-611.71) To address with Dr. Jennette Kettle  Problem # 3:  Preventive Health Care (ICD-V70.0) Flu and Pneumovax  Complete Medication List: 1)  Lamictal 200 Mg Tabs (Lamotrigine) .Marland Kitchen.. 1 tablet by mouth once a day 2)  Premarin 0.625 Mg/gm Crea (Estrogens, conjugated) .Marland Kitchen.. 1 tab po daily 3)  Vicodin 5-500 Mg Tabs (Hydrocodone-acetaminophen) .... One tablet every 8 hrs prn pain 4)  Simvastatin 20 Mg Tabs (Simvastatin) .Marland Kitchen.. 1 tab by mouth daily 5)  Prilosec 20 Mg Cpdr (Omeprazole) .... 2 caps by mouth daily  Other Orders: Flu Vaccine 36yrs + (01027) Admin 1st Vaccine (25366) Pneumococcal Vaccine (44034) Admin of Any Addtl Vaccine (74259)   Patient Instructions: 1)  Stop smoking 2)  Stop caffeine. 3)  Push PLAIN OLD water 4)  Sign release of info for Dr. Isabel Caprice going back to 11/2005 to present.    Prescriptions: VICODIN 5-500 MG  TABS (HYDROCODONE-ACETAMINOPHEN) One tablet every 8 hrs prn pain  #90 x 0   Entered and Authorized by:   Julieanne Manson MD   Signed by:   Julieanne Manson MD on 02/09/2007   Method used:   Print then Give to Patient   RxID:   [id number]  ]  Pneumovax Vaccine    Vaccine Type: Pneumovax    Site: right deltoid     Mfr: Merck    Dose: 0.5 ml    Route: IM    Given by: Vesta Mixer CMA    Exp. Date: 09/30/2007    Lot #: [id number]    VIS given: 11/14/95 version given February 09, 2007.  Influenza Vaccine    Vaccine Type: Fluvax 3+    Site: left deltoid    Mfr: Sanofi Pasteur    Dose: 0.5 ml    Route: IM    Given by: Vesta Mixer CMA    Exp. Date: 09/15/2007    Lot #: [id number]    VIS given: 10/15/04 version given February 09, 2007.  Flu Vaccine Consent Questions    Do you have a history of severe allergic reactions to this vaccine? no    Any prior history of allergic reactions to egg and/or gelatin? no    Do you have a sensitivity to the preservative Thimersol? no    Do you have a past history of Guillan-Barre Syndrome? no    Do you currently have an acute febrile illness? no    Have you ever had a severe reaction to latex? no    Vaccine information given and explained to patient? yes    Are you currently pregnant? no

## 2010-05-18 NOTE — Medication Information (Signed)
Summary: [facility] Folder[facility]  RX Folder[facility]   Imported By: Arta Bruce 05/28/2007 15:20:03  _____________________________________________________________________  External Attachment:    Type:   Image     Comment:   External Document

## 2010-05-18 NOTE — Assessment & Plan Note (Signed)
Summary: NEEDS DERM REFERRAL//KT   Vital Signs:  Patient Profile:   60 Years Old Female Height:     66 inches Weight:      137 pounds BMI:     22.19 Temp:     97.1 degrees F Pulse rate:   80 / minute Pulse rhythm:   regular Resp:     18 per minute BP sitting:   120 / 78  (left arm) Cuff size:   regular  Pt. in pain?   yes    Location:   bladder    Intensity:   4  Vitals Entered By: Vesta Mixer CMA (April 03, 2007 8:53 AM)              Is Patient Diabetic? No  Does patient need assistance? Ambulation Normal Comments never started neurontin     Chief Complaint:  requesting derm referral for cyst on back of neck and seen in ed for it.  Also has some moles she wants checked out.Marland Kitchen  History of Present Illness: 1.  Has mole on left shoulder and one on forearm that she thinks might be darker--wonders if should be seen by derm.  2.  Had infected cyst lanced at [facility] about a month ago--wants to have removed.  3.  Dealing with stress of son going AWOL from [employer]. [place] in [place] at home.  Mom not sure what will happen.  4.  Reportedly  on PE with Dr. Jennette Kettle, breast mass found on left, she has not gone for mammogram yet--missed appt.  5.  Interstitial Cystitis--had another treatment--doesn't feel it helped too much.  Did not obtain Neurontin.  States was on way to hospital when our office called.  Current Allergies (reviewed today): ! HALDOL DECANOATE (HALOPERIDOL DECANOATE)      Physical Exam  Skin:     Several well circumsribed lesions, raised, varying in color from skin tone to light brown, on left forearm and shoulder, varying in size from 1/2 cm to 1 cm.  Healed I and D wound, right posterior neck/shoulder, with thickening beneath--pt describes a pinhole opening before I and D.    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 Observe for now--to call if significant  change.  Problem # 2:  EPIDERMOID CYST, BACK (ICD-706.2) Pt. requests removal. Orders: Surgical Referral (Surgery)   Problem # 3:  INTERSTITIAL CYSTITIS (ICD-595.1) Neurontin represcribed--to follow up in 3 months.  Complete Medication List: 1)  Lamictal 200 Mg Tabs (Lamotrigine) .Marland Kitchen.. 1 tablet by mouth once a day 2)  Premarin 0.625 Mg/gm Crea (Estrogens, conjugated) .Marland Kitchen.. 1 tab po daily 3)  Vicodin 5-500 Mg Tabs (Hydrocodone-acetaminophen) .... One tablet every 8 hrs prn pain 4)  Simvastatin 20 Mg Tabs (Simvastatin) .Marland Kitchen.. 1 tab by mouth daily 5)  Prilosec 20 Mg Cpdr (Omeprazole) .... 2 caps by mouth daily 6)  Neurontin 300 Mg Caps (Gabapentin) .Marland Kitchen.. 1 cap by mouth at bedtime for 3 days.  increase by 300 mg q 3 days until reach dose of 600 mg two times a day and maintain.   Patient Instructions: 1)  Call if you do not hear about Surgery consult in next 2-3 weeks. 2)  Start Neurontin at 1 cap at bedtime.  In 3 days, increase to 1 cap two times a day , in 3 more days increase to 1 cap three times a day .  Wait 3 more days and begin 2 caps at bedtime, the  other doses during day at 1 cap.  In 3 more days, increase morning dose to 2 caps as well and then in another 3 days increase to 3 caps three times a day and remain on that dose. 3)  Call for follow up in 3 months.    Prescriptions: NEURONTIN 300 MG  CAPS (GABAPENTIN) 1 cap by mouth at bedtime for 3 days.  Increase by 300 mg q 3 days until reach dose of 600 mg two times a day and maintain.  #120 x 2   Entered and Authorized by:   Julieanne Manson MD   Signed by:   Julieanne Manson MD on 04/03/2007   Method used:   Print then Give to Patient   RxID:   [id number] NEURONTIN 300 MG  CAPS (GABAPENTIN) 1 cap by mouth at bedtime for 3 days.  Increase by 300 mg q 3 days until reach dose of 600 mg two times a day and maintain.  #120 x 2   Entered and Authorized by:   Julieanne Manson MD   Signed by:   Julieanne Manson MD on  04/03/2007   Method used:   Print then Give to Patient   RxID:   [id number]  Reprinted on controlled paper the second time--only one prescription given.]

## 2010-06-12 ENCOUNTER — Emergency Department (HOSPITAL_COMMUNITY)
Admission: EM | Admit: 2010-06-12 | Discharge: 2010-06-12 | Disposition: A | Discharge status: Home / Self Care | Payer: Medicare Other | Attending: Emergency Medicine | Admitting: Emergency Medicine

## 2010-06-12 DIAGNOSIS — E7800 Pure hypercholesterolemia, unspecified: Secondary | ICD-10-CM | POA: Insufficient documentation

## 2010-06-12 DIAGNOSIS — N390 Urinary tract infection, site not specified: Secondary | ICD-10-CM | POA: Insufficient documentation

## 2010-06-12 DIAGNOSIS — Z8742 Personal history of other diseases of the female genital tract: Secondary | ICD-10-CM | POA: Insufficient documentation

## 2010-06-12 DIAGNOSIS — F329 Major depressive disorder, single episode, unspecified: Secondary | ICD-10-CM | POA: Insufficient documentation

## 2010-06-12 DIAGNOSIS — F3289 Other specified depressive episodes: Secondary | ICD-10-CM | POA: Insufficient documentation

## 2010-06-12 DIAGNOSIS — R300 Dysuria: Secondary | ICD-10-CM | POA: Insufficient documentation

## 2010-06-12 LAB — URINALYSIS, ROUTINE W REFLEX MICROSCOPIC
Bilirubin Urine: NEGATIVE
Ketones, ur: NEGATIVE mg/dL
Nitrite: NEGATIVE
Protein, ur: NEGATIVE mg/dL
Specific Gravity, Urine: 1.009 (ref 1.005–1.030)
Urine Glucose, Fasting: NEGATIVE mg/dL
Urobilinogen, UA: 0.2 mg/dL (ref 0.0–1.0)
pH: 6.5 (ref 5.0–8.0)

## 2010-06-12 LAB — URINE MICROSCOPIC-ADD ON

## 2010-06-14 LAB — URINE CULTURE: Culture  Setup Time: 201202252105

## 2010-07-21 ENCOUNTER — Emergency Department (HOSPITAL_COMMUNITY)
Admission: EM | Admit: 2010-07-21 | Discharge: 2010-07-21 | Disposition: A | Discharge status: Home / Self Care | Payer: Medicare Other | Attending: Emergency Medicine | Admitting: Emergency Medicine

## 2010-07-21 DIAGNOSIS — M25519 Pain in unspecified shoulder: Secondary | ICD-10-CM | POA: Insufficient documentation

## 2010-07-21 DIAGNOSIS — H269 Unspecified cataract: Secondary | ICD-10-CM | POA: Insufficient documentation

## 2010-07-21 DIAGNOSIS — E7800 Pure hypercholesterolemia, unspecified: Secondary | ICD-10-CM | POA: Insufficient documentation

## 2010-07-25 ENCOUNTER — Emergency Department (HOSPITAL_COMMUNITY): Payer: Medicare Other

## 2010-07-25 ENCOUNTER — Emergency Department (HOSPITAL_COMMUNITY)
Admission: EM | Admit: 2010-07-25 | Discharge: 2010-07-25 | Disposition: A | Discharge status: Home / Self Care | Payer: Medicare Other | Attending: Emergency Medicine | Admitting: Emergency Medicine

## 2010-07-25 DIAGNOSIS — E7800 Pure hypercholesterolemia, unspecified: Secondary | ICD-10-CM | POA: Insufficient documentation

## 2010-07-25 DIAGNOSIS — Y92009 Unspecified place in unspecified non-institutional (private) residence as the place of occurrence of the external cause: Secondary | ICD-10-CM | POA: Insufficient documentation

## 2010-07-25 DIAGNOSIS — W1789XA Other fall from one level to another, initial encounter: Secondary | ICD-10-CM | POA: Insufficient documentation

## 2010-07-25 DIAGNOSIS — S298XXA Other specified injuries of thorax, initial encounter: Secondary | ICD-10-CM | POA: Insufficient documentation

## 2010-07-25 DIAGNOSIS — R079 Chest pain, unspecified: Secondary | ICD-10-CM | POA: Insufficient documentation

## 2010-08-25 ENCOUNTER — Emergency Department (HOSPITAL_BASED_OUTPATIENT_CLINIC_OR_DEPARTMENT_OTHER)
Admission: EM | Admit: 2010-08-25 | Discharge: 2010-08-25 | Disposition: A | Discharge status: Home / Self Care | Payer: Medicare Other | Attending: Emergency Medicine | Admitting: Emergency Medicine

## 2010-08-25 ENCOUNTER — Emergency Department (INDEPENDENT_AMBULATORY_CARE_PROVIDER_SITE_OTHER): Payer: Medicare Other

## 2010-08-25 DIAGNOSIS — Z79899 Other long term (current) drug therapy: Secondary | ICD-10-CM | POA: Insufficient documentation

## 2010-08-25 DIAGNOSIS — R569 Unspecified convulsions: Secondary | ICD-10-CM

## 2010-08-25 DIAGNOSIS — M542 Cervicalgia: Secondary | ICD-10-CM

## 2010-08-25 DIAGNOSIS — R51 Headache: Secondary | ICD-10-CM

## 2010-08-25 DIAGNOSIS — W19XXXA Unspecified fall, initial encounter: Secondary | ICD-10-CM

## 2010-08-25 DIAGNOSIS — K219 Gastro-esophageal reflux disease without esophagitis: Secondary | ICD-10-CM | POA: Insufficient documentation

## 2010-08-25 DIAGNOSIS — F1410 Cocaine abuse, uncomplicated: Secondary | ICD-10-CM | POA: Insufficient documentation

## 2010-08-25 DIAGNOSIS — M549 Dorsalgia, unspecified: Secondary | ICD-10-CM

## 2010-08-25 LAB — DIFFERENTIAL
Basophils Absolute: 0 10*3/uL (ref 0.0–0.1)
Basophils Relative: 1 % (ref 0–1)
Eosinophils Absolute: 0.2 10*3/uL (ref 0.0–0.7)
Eosinophils Relative: 3 % (ref 0–5)
Lymphocytes Relative: 25 % (ref 12–46)
Lymphs Abs: 1.6 10*3/uL (ref 0.7–4.0)
Monocytes Absolute: 0.4 10*3/uL (ref 0.1–1.0)
Monocytes Relative: 6 % (ref 3–12)
Neutro Abs: 4.3 10*3/uL (ref 1.7–7.7)
Neutrophils Relative %: 66 % (ref 43–77)

## 2010-08-25 LAB — CBC
HCT: 40.5 % (ref 36.0–46.0)
Hemoglobin: 14.2 g/dL (ref 12.0–15.0)
MCH: 31.2 pg (ref 26.0–34.0)
MCHC: 35.1 g/dL (ref 30.0–36.0)
MCV: 89 fL (ref 78.0–100.0)
Platelets: 230 10*3/uL (ref 150–400)
RBC: 4.55 MIL/uL (ref 3.87–5.11)
RDW: 13.6 % (ref 11.5–15.5)
WBC: 6.5 10*3/uL (ref 4.0–10.5)

## 2010-08-25 LAB — URINALYSIS, ROUTINE W REFLEX MICROSCOPIC
Bilirubin Urine: NEGATIVE
Glucose, UA: NEGATIVE mg/dL
Ketones, ur: NEGATIVE mg/dL
Leukocytes, UA: NEGATIVE
Nitrite: NEGATIVE
Protein, ur: NEGATIVE mg/dL
Specific Gravity, Urine: 1.015 (ref 1.005–1.030)
Urobilinogen, UA: 0.2 mg/dL (ref 0.0–1.0)
pH: 6.5 (ref 5.0–8.0)

## 2010-08-25 LAB — RAPID URINE DRUG SCREEN, HOSP PERFORMED
Amphetamines: NOT DETECTED
Barbiturates: NOT DETECTED
Benzodiazepines: NOT DETECTED
Cocaine: POSITIVE — AB
Opiates: NOT DETECTED
Tetrahydrocannabinol: NOT DETECTED

## 2010-08-25 LAB — BASIC METABOLIC PANEL WITH GFR
BUN: 12 mg/dL (ref 6–23)
CO2: 24 meq/L (ref 19–32)
Calcium: 9.7 mg/dL (ref 8.4–10.5)
Chloride: 102 meq/L (ref 96–112)
Creatinine, Ser: 0.8 mg/dL (ref 0.4–1.2)
GFR calc non Af Amer: >60 mL/min
Glucose, Bld: 128 mg/dL — ABNORMAL HIGH (ref 70–99)
Potassium: 3.4 meq/L — ABNORMAL LOW (ref 3.5–5.1)
Sodium: 141 meq/L (ref 135–145)

## 2010-08-25 LAB — URINE MICROSCOPIC-ADD ON

## 2010-08-31 NOTE — Op Note (Signed)
NAMELELIA, JONS                ACCOUNT NO.:  [id number]   MEDICAL RECORD NO.:  [id number]           PATIENT TYPE:   LOCATION:                                 FACILITY:   PHYSICIAN:  Jamison Neighbor, M.D.  DATE OF BIRTH:  November 03, 1950   DATE OF PROCEDURE:  07/17/2007  DATE OF DISCHARGE:                               OPERATIVE REPORT   PREOPERATIVE DIAGNOSIS:  Urge incontinence.   POSTOPERATIVE DIAGNOSIS:  Urge incontinence.   PROCEDURE:  Second stage InterStim.   ATTENDING PHYSICIAN:  Jamison Neighbor, M.D.   RESIDENT PHYSICIAN:  Gerome [name].   ANESTHESIA:  MAC.   INDICATIONS FOR PROCEDURE:  Ms. Jarvie is a 60 year old white female  with past medical history positive for urge incontinence.  She recently  had first stage InterStim placement and has had good clinical results  with this.  She was so pleased that she elected to undergo second-stage  formal generator implantation.  Preoperatively, she was counseled on her  risks, consequences and benefits from the procedure and informed consent  was obtained.   PROCEDURE IN DETAIL:  The patient was brought to the operating room and  placed in the supine position.  She was clinically identified by wrist  band and appropriate time-out was taken.  MAC anesthesia was delivered  and then was placed in the prone position.  Her entire back was prepped  and draped sterilely.  Her temporary generator connections were in the  left upper buttock area with the temporary lead exiting the right  posterior hip area.  This was all prepped into the field.  The temporary  lead was secured with a hemostat.  This was done outside of the sterile  field.  We began our procedure by sharply incising over the previous  scar in the left posterior buttock.  This was done to the level of the  temporary lead/sleeve in the left upper buttock with electrocautery.  All bleeding sites were taken care of.  The temporary lead and sleeve  were then delivered  outside of the wound.  The protective boot was  incised as well as the securing sutures sharply.  This was removed.  The  screwdriver was employed to loosen the screws going to the permanent  lead.  The sleeve and temporary leads were then removed and discarded,  leaving the permanent lead behind.  We then dried the lead and cleaned  it thoroughly.  Being sure that it was dry, we then placed it in the  generator pack and tightened down the screw until we heard clicking.  We  then bluntly dissected a small pocket in the adipose tissue in her left  upper buttock.  We copiously irrigated this with antibiotic solution.  We placed the generator pack with righting side up into the pocket and  secured the wire underneath the generator.  We then closed the  subcutaneous/adipose layer with 2-0 Vicryl, taking great care not to  damage the lead with the needle.  Once this was closed, we then tested  our generator pack with our [employer] representative.  There  were good  results  with this and subsequently we then closed the skin with multiple  vertical mattress sutures using Ethilon.  We then recleaned,  reinspected, and dressed the wound.  This marked the end of our  procedure.  There were no complications.  She tolerated the procedure  well.  Dr. Logan Bores was present and participated in all aspects of the  case.      Jamison Neighbor, M.D.  Electronically Signed     Jamison Neighbor, M.D.  Electronically Signed    RJE/MEDQ  D:  07/17/2007  T:  07/17/2007  Job:  [id number]

## 2010-08-31 NOTE — Discharge Summary (Signed)
[name], [name]                ACCOUNT NO.:  [id number]   MEDICAL RECORD NO.:  [id number]          PATIENT TYPE:  INP   LOCATION:  3704                         FACILITY:  [facility]   PHYSICIAN:  Elliot Cousin, M.D.    DATE OF BIRTH:  12/01/1950   DATE OF ADMISSION:  02/14/2008  DATE OF DISCHARGE:  02/18/2008                               DISCHARGE SUMMARY   DISCHARGE DIAGNOSES:  1. The patient left against medical advice.  2. Altered mental status, thought to be secondary to a combination of      alcohol and cocaine, concomitant with psychotropic medications.  3. Polysubstance abuse (alcohol, tobacco, and cocaine).  4. Probable aspiration pneumonia.  5. Hypotension secondary to intravenous morphine.  6. Right upper extremity edema, consistent with contusion from fall at      home.  7. Transaminitis.  8. Mild hematuria with a history of interstitial cystitis.  9. Normocytic anemia.  (Iron studies revealed a ferritin of 223, total      iron of 44, TIBC of 238, percent saturation of 18, folate of 15.2,      and vitamin B12 of 560).  10.History of depression.   MEDICATIONS:  None were prescribed at the time of hospital discharge as  the patient left AMA.  However, she had been previously treated with  Lamictal, Premarin, Lunesta and amitriptyline.   CONSULTATIONS:  Clinical Child psychotherapist.   PROCEDURES PERFORMED:  1. Right upper extremity venous Doppler on February 18, 2008.  The      preliminary results revealed no right upper extremity DVT.  2. Chest x-ray on February 18, 2008.  The results revealed small      bilateral pleural effusions.  Bibasilar air space disease has      increased slightly in the interval.  3. CT scan of the right upper extremity on February 16, 2008.  The      results revealed negative for hematoma with edematous change about      the left shoulder and base of the right neck.  No fracture.  4. Chest x-ray on February 16, 2008.  The results revealed  improving      right basilar air space disease.  5. Ultrasound of the abdomen on February 15, 2008.  The results      revealed gallbladder distention without specific evidence of      gallstones or acute cholecystitis, common bile duct at the upper      limits of normal without intrahepatic biliary ductal dilatation,      1.6 cm hyperechoic liver lesion, most likely a hemangioma.  6. X-ray of the right shoulder on February 14, 2008.  The results      revealed no acute findings.  7. CT scan of the head without contrast on February 14, 2008.  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  No acute      intracranial abnormality.   HISTORY OF PRESENT ILLNESS:  The patient is a 60 year old woman with a  past medical history significant for interstitial cystitis and  depression,  who presented to the emergency department on February 14, 2008, with altered mental status.  According to record, the patient was  found at home by her son.  The patient was unconscious or semi-conscious  at the time.  When she arrived to the emergency department, she was  still a little confused; however, she was able to provide some history.  Accordingly, the patient only remembered drinking a couple of beers and  eating a hot dog.  She denied any illicit drug use.  She denied taking  more of her medications than prescribed.  She denied trying to harm  herself.  When the patient was evaluated in the emergency department,  her chest x-ray revealed an infiltrate in the right lung base.  Her lab  data were significant for an elevated WBC of 15.2, an elevated AST of  292, and an elevated ALT of 144.  The CT scan of her head revealed no  acute intracranial abnormality.  The patient was admitted for further  evaluation and management.   For additional details, please see the dictated history and physical.   HOSPITAL COURSE:  1. ALTERED MENTAL STATUS.  As indicated above, the CT scan of the       patient's head revealed no acute intracranial abnormalities.  For      further evaluation, a urine pregnancy test, urine drug screen,      tricyclic screen, salicylate screen, and an ammonia level were      ordered.  The urine pregnancy test was negative.  The urine drug      screen was positive for benzodiazepines and cocaine.  The tricyclic      screen was positive; however, the patient is chronically treated      with amitriptyline.  The salicylate level was less than 4.      Acetaminophen and alcohol levels were assessed as well and the      results were unremarkable.  Her ammonia level was within normal      limits at 27.  Additional studies were ordered and the results were      not remarkable.  Her RPR was nonreactive.  Her TSH was within      normal limits at 4.169.  Her vitamin B12 was 560.   The patient was monitored throughout the hospitalization.  She became  much more alert and oriented.  Because of the positive urine drug  screen, the social worker was consulted to provide counseling and  outpatient resources for assistance.  The patient felt that she did not  need assistance as she believed that there was no problem with her  alcohol and/or cocaine use.  Lamictal, Lunesta and amitriptyline were  all withheld during the hospitalization.  Prior to the patient leaving  AMA, she was neurologically intact.  She decided to leave against  medical advice because of her inability to smoke and because intravenous  morphine was withheld as a consequence of hypotension.  1. PNEUMONIA, POSSIBLY SECONDARY TO ASPIRATION.  At the time of the      initial hospital assessment, the patient's chest x-ray revealed      right basilar pneumonia.  Given her history of semi-consciousness      at home following eating the hot dog, she was started empirically      on intravenous clindamycin.  A follow-up chest x-ray revealed      partial clearing.  The patient became febrile and therefore       clindamycin was  discontinued and she was started instead on      treatment with Zosyn.  Of note, the patient's white blood cell      count was elevated at 15.2 at the time of the initial hospital      assessment.  After several days of Zosyn, the patient's WBC      normalized to 6.0 and she became afebrile.  A follow-up chest x-ray      revealed bibasilar opacities.  The patient does smoke and she was      advised to stop.  A nicotine patch was placed.  Prior to hospital      discharge, she was oxygenating 95% on room air.  2. RIGHT UPPER EXTREMITY EDEMA CONSISTENT WITH CONTUSION.  The patient      complained of right upper extremity pain and swelling.  She later      admitted becoming drunk and falling on her arm at home.  For      further evaluation, an x-ray of the right shoulder was ordered.  It      revealed no acute findings.  Subsequently, a CT scan and venous      doppler of the right arm were ordered.  The CT scan revealed no      evidence of hematoma; however, there were edematous changes about      the left shoulder.  There were no acute fractures.  The ultrasound      revealed no evidence of DVT.  The patient was treated      symptomatically with ice packs and as-needed morphine, as-needed      oxycodone, and as-needed Ultram.  There appeared to be no evidence      of compartment syndrome.  The patient was able to raise her arm and      bend her arm.  Her pulses were intact.  3. TRANSAMINITIS.  The patient's liver transaminases were elevated at      the time of the initial hospital assessment.  She did admit      drinking alcohol.  For further evaluation, an ultrasound of the      abdomen was ordered.  The ultrasound revealed gallbladder      distention without evidence of gallstones or acute cholecystitis.      There was a 1.6 cm liver lesion, consistent with a hemangioma.  Her      transaminases were monitored during the hospitalization.  Her SGOT      did improve slightly  to 139 and her SGPT decreased slightly to 113.      Given the patient's history, more than likely, the transaminitis      was secondary to chronic alcohol use.  4. NORMOCYTIC ANEMIA.  The patient's hemoglobin at the time of the      initial hospital assessment was 16.2.  With volume repletion, it      fell to a nadir of 9.6.  The patient did have some mild hematuria;      however, the hematuria did not explain the dramatic fall in her      hemoglobin.  The patient had no evidence of bright red blood per      rectum or black tarry stools.  More than likely, the patient was      volume contracted at the time of the initial hospital assessment      and with IV fluid hydration, her hemoglobin fell because of the      dilutional effect.  Iron studies were ordered and the results are      above.  5. HYPOTENSION (ASYMPTOMATIC).  The patient's systolic blood pressure      decreased to the upper 80s to low 90s.  The hypotension was felt to      be secondary to the effects of intravenous morphine.  The patient      was completely asymptomatic.  The morphine was withheld.  She was      subsequently started on treatment with as-needed Ultram along with      as-needed oxycodone.  When the decision was made to withhold      morphine, the patient became upset.  Nevertheless, she decided to      leave against medical advice.      Elliot Cousin, M.D.  Electronically Signed     DF/MEDQ  D:  02/19/2008  T:  02/19/2008  Job:  [id number]

## 2010-08-31 NOTE — Op Note (Signed)
NAMESHANAIYA, [name]                ACCOUNT NO.:  [id number]   MEDICAL RECORD NO.:  [id number]          PATIENT TYPE:  AMB   LOCATION:  [facility]                         FACILITY:  [facility]   PHYSICIAN:  Valetta Fuller, M.D.  DATE OF BIRTH:  10/02/1950   DATE OF PROCEDURE:  DATE OF DISCHARGE:                               OPERATIVE REPORT   PREOPERATIVE DIAGNOSIS:  Interstitial cystitis/chronic pelvic pain.   POSTOPERATIVE DIAGNOSIS:  Interstitial cystitis/chronic pelvic pain.   PROCEDURE PERFORMED:  Urethral dilation, cystoscopy with hydraulic  overdistention of the bladder and installation of Clorpactin and  Marcaine.   ANESTHESIA:  General.   INDICATIONS:  [name] is a 60 year old female who has had some very  longstanding problems with chronic pelvic pain and voiding dysfunction.  She has been felt to have chronic interstitial cystitis/chronic pelvic  pain syndrome.  She has also had a history of intermittent  microhematuria.  Upper tract imaging studies as well as several  cystoscopy, cytologies and retrograde pyelograms have been negative.  Previous hydraulic overdistention of the bladder was done about a year  ago.  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  She  typically has had some improvement with HODs in the past.  Recently, she  had video urodynamics which showed relatively good bladder capacity  without instability, but quite a bit of hypersensitivity.  She has  persisted in having severe pelvic pain as well as significant frequency.  We talked about several options and she elected to have a repeat  endoscopic attempt at managing things.   TECHNIQUE AND FINDINGS:  The patient was brought to the operating room  where she had successful induction of general anesthesia.  She was  placed in the mid lithotomy position and prepped and draped in the usual  manner.  The urethra was dilated to 65 [place].  The cystoscopy initially  showed no evidence of bladder  abnormalities with normal mucosa and clear  efflux of urine from both sides.  The patient underwent hydraulic  distention of her bladder for 5 minutes under 80-100 cm water pressure.  Capacity was measured at right around 1,000 mL.  The patient did,  however, have diffuse 3+ glomerular hemorrhaging.  A repeat hydraulic  overdistention for 2 minutes was performed and the bladder was then  drained.  With the red Robinson catheter, we instilled some Clorpactin  in a standard concentration a few hundred mL at a time just under low  gravity drainage.  The bladder was then irrigated clear and some  Marcaine was placed in her bladder.  She appeared to tolerate the  procedure well and was brought to recovery room in stable condition.           ______________________________  Valetta Fuller, M.D.  Electronically Signed     DSG/MEDQ  D:  02/14/2007  T:  02/14/2007  Job:  [id number]

## 2010-08-31 NOTE — Op Note (Signed)
[name], [name]                ACCOUNT NO.:  [id number]   MEDICAL RECORD NO.:  [id number]          PATIENT TYPE:  AMB   LOCATION:  [facility]                         [facility]AC[facility]   PHYSICIAN:  Jamison Neighbor, M.D.  DATE OF BIRTH:  Sep 16, 1950   DATE OF PROCEDURE:  03/03/2008  DATE OF DISCHARGE:                               OPERATIVE REPORT   PREOPERATIVE DIAGNOSIS:  1. Interstitial cystitis.  2. Urgency and frequency.   POSTOPERATIVE DIAGNOSIS:  1. Interstitial cystitis.  2. Urgency and frequency.   PROCEDURE:  Cystoscopy, urethral calibration, hydrodistention of the  bladder, Marcaine and Pyridium installation, Marcaine and Kenalog  injection, Botox injections times one.   SURGEON:  Jamison Neighbor, M.D.   ANESTHESIA:  General.   COMPLICATIONS:  None.   DRAINS:  None.   BRIEF HISTORY:  This 60 year old female has a complex situation.  She  has had problems with chronic painful bladder syndrome, felt to be  consistent with interstitial cystitis.  She also has severe urgency and  frequency caused at least in part by that condition.  The patient  previously had an InterStim implant placed which worked well, although  recently that has stopped working.  She had been offered the possibility  of having this revised as this may have become problematic when she took  a fall.  However, an x-ray shows this appears to be in good position.  The patient has requested that we do something different for the control  of her urgency and frequency.  We have used anticholinergic therapies in  various forms with her currently using Gelnique.  The patient is  interested in having a hydrodistention with Botox.  She does understand  that there is the possibility that she will have problems with retention  and is willing to do either self-catheterization or wear a catheter  short-term.  Full informed consent was obtained.   PROCEDURE:  After successful induction of general anesthesia, the  patient was placed in the dorsal lithotomy position, prepped with  Betadine and draped in the usual sterile fashion.  Careful bimanual  examination revealed we had reasonably good support for urethra with no  evidence of urethrocele, there was no cystocele.  The vault was somewhat  shortened due to previous vaginal surgery.  She has a modest rectocele  which is asymptomatic.  Urethra was calibrated 32-French with female  urethral sounds with no signs of stenosis or stricture.  The cystoscope  was inserted.  The bladder was carefully inspected.  No tumors or stones  could be seen.  Both ureteral orifices were normal in configuration and  location.  Hydrodistention of the bladder was performed.  The bladder  was distended at a pressure of 100 cmH2O for 5 minutes. When the bladder  was drained, no glomerulations could be seen in the bladder capacity was  near normal at 900 mL.  This is a major improvement from what the  patient had previously and would suggest that she has had some  improvement on the medications.  The patient underwent Botox injection.  Based on recent report from  Dr. Ernesta Amble, only 1 ampule of Botox  was injected as it has been demonstrated that that seems to work just as  well as larger dosages with less problems with retention as a side  effect.  The patient was injected across the base of bladder including  the trigone.  The bladder was drained.  A mixture of Marcaine and  Pyridium was left in the bladder.  Marcaine and Kenalog were injected  periurethral as a pudendal block.  The patient tolerated the procedure  well and was taken to recovery in good condition.  She will be sent home  with Pyridium Plus and doxycycline.  She already has Lorcet.  The  patient will return in follow-up.  If she is having retention issues,  she will go home on the Foley catheter and will be taught self-  catheterization.  If urgency and frequency are still problematic, we  will  reprogram her and if there are clear-cut problems with the  InterStim, revision can be considered although that is less likely,  given a normal appearance of the wires.      Jamison Neighbor, M.D.  Electronically Signed     RJE/MEDQ  D:  03/03/2008  T:  03/03/2008  Job:  [id number]

## 2010-08-31 NOTE — Op Note (Signed)
[name], [name]                ACCOUNT NO.:  [id number]   MEDICAL RECORD NO.:  [id number]          PATIENT TYPE:  AMB   LOCATION:  [facility]                         FACILITY:  [facility]   PHYSICIAN:  Jamison Neighbor, M.D.  DATE OF BIRTH:  1951-03-11   DATE OF PROCEDURE:  07/03/2007  DATE OF DISCHARGE:                               OPERATIVE REPORT   SERVICE:  Urology.   PREOPERATIVE DIAGNOSIS:  Urge incontinence.   POSTOPERATIVE DIAGNOSIS:  Urge incontinence.   PROCEDURES:  First stage InterStim implantation.   SURGEON:  Dr. Marcelyn Bruins.   ANESTHESIA:  Local plus IV sedation.   COMPLICATIONS:  None.   DRAINS:  None.   BRIEF HISTORY:  This 60 year old female has severe urgency incontinence  voiding approximately 30 times a day.  She has had as many as 8 voids in  1 hour with very poor control.  She has failed to respond to oral  therapy.  She is now to undergo first stage InterStim implantation to  determine if she is a candidate for permanent InterStim.  She  understands the risks and benefits of the procedure.  She had been told  that it is mandatory that she continue to keep her voiding diary.  She  also realized that there is no guarantee that this first stage approach  will work and that the second stage is necessary for definitive care.  She gave full informed consent.   PROCEDURE:  After successful induction of adequate IV sedation, the  patient was placed in the prone position.  She was prepped with Betadine  for a full 10 minutes including both scrub and paint.  The buttocks were  pulled apart with tape and the entire area was covered with a Vi-Drape.  She was then draped in the usual fashion. The patient had received  preoperative Ancef and gentamicin. Fluoroscopy is used to identify the  approximate level of the third sacral foramen, the area over top was  infiltrated. A needle was passed into the foramen and testing was  performed. Needle position was moved around  until the optimal effect was  obtained.  It was a better response on the right than on the left but a  response was obtained bilaterally. With the needle in the optimal  position, there was a good bellows effect at low amplitude as well as  dorsiflexion of the great toe.  A small incision was made and the  dilating trocar was then passed down over a guidewire.  The quadripolar  lead was placed down within the sheath and positioned so that there was  good stimulation on leads 1, 2 and 3. A small incision was made in the  upper aspect of the left buttock.  The lead was then passed over to that  small incision.  The lead extender was then connected in the usual  fashion and tied down with silk ties.  The pocket was that created was  then used to place the connecter down within the tissue. The area was  closed with 3-0 nylon and the paramedian was closed in identical  fashion. The patient tolerated the  procedure well and was taken to the recovery room in good condition.  She will be sent home with a small prescription for Tylox as well as  Keflex and return to see me in 1 week for review. If she does well at  that point she will have her second stage implant placed.      Jamison Neighbor, M.D.  Electronically Signed     RJE/MEDQ  D:  07/03/2007  T:  07/03/2007  Job:  [id number]
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
## 2010-09-03 NOTE — Discharge Summary (Signed)
NAMEKERSTIN, [name]                          ACCOUNT NO.:  [id number]   MEDICAL RECORD NO.:  [id number]                   PATIENT TYPE:  INP   LOCATION:  5041                                 FACILITY:  [facility]   PHYSICIAN:  Talmadge Coventry, M.D.            DATE OF BIRTH:  1950/12/28   DATE OF ADMISSION:  11/03/2002  DATE OF DISCHARGE:  11/07/2002                                 DISCHARGE SUMMARY   DISCHARGE DIAGNOSES:  1. Cellulitis of right foot secondary to ulcer of right heel.  2. Remote history of drug abuse.  3. History of peptic ulcer disease.  4. Remote history of psychosis.   CONSULTATIONS:  1. Orthopedics, Dr. Chaney Malling.  2. Infectious disease, Dr. Lina Sayre.   HISTORY OF PRESENT ILLNESS:  This is a 60 year old female with no local  medical doctor who presented to the emergency room on November 03, 2002, after  being seen several times in the emergency room for a blister that developed  on the right heel after wearing new footwear.  She had been seen in the  emergency room several times.  She has had a blister which a friend lanced,  developed pain and a recurrent blister had been treated orally with Keflex  and given Rocephin and subsequently developed a fever to 103.  In the  emergency room she had a temperature of 101.8, right tender inguinal lymph  nodes, a large bullous in the right heel, erythema, warmth and swelling of  the right foot and ankle.  A white count of 13.5.  Initial impression was  that of cellulitis of the right foot, not responsive to outpatient  treatment.  Plan was to admit the patient, elevate the foot and treat with  Rocephin and consult orthopedics.   HOSPITAL COURSE:  The patient was seen by orthopedics.  The area was opened.  Orthopedics recommended infectious disease consult who felt that Rocephin  should be changed to oral Ancef.  She was treated initially with p.o. pain  medications which were then switched to Demerol and subsequently  to p.o.  morphine.  The next few days were taken up with treatment of pain and  debridement and antibiotic treatment.  By November 06, 2002, the patient had  defervesced and the decision was made for her to be discharged the next day  with 14-day course total of antibiotics.   DISCHARGE MEDICATIONS:  1. Keflex 500 q.i.d. for a total of two weeks.  2. Vicodin #60 for pain.   FOLLOW UP:  Discharge follow-up with Dr. Chaney Malling.                                                Talmadge Coventry, M.D.    AM/MEDQ  D:  12/08/2002  T:  12/09/2002  Job:  [id number]  

## 2010-09-03 NOTE — Op Note (Signed)
[name], Claire                ACCOUNT NO.:  [id number]   MEDICAL RECORD NO.:  [id number]          PATIENT TYPE:  AMB   LOCATION:  ENDO                         FACILITY:  [facility]   PHYSICIAN:  John C. Madilyn Fireman, M.D.    DATE OF BIRTH:  11/14/1950   DATE OF PROCEDURE:  05/05/2004  DATE OF DISCHARGE:                                 OPERATIVE REPORT   PROCEDURE:  Colonoscopy.   INDICATIONS FOR PROCEDURE:  History of adenomatous colon polyps.   PROCEDURE:  The patient was placed in the left lateral decubitus position  and placed on the pulse monitor with continuous low-flow oxygen delivered by  nasal cannula.  She was sedated with 125 mcg IV fentanyl and 12 mg IV  Versed.  The Olympus video colonoscope was inserted into the rectum and  advanced to the cecum, confirmed by transillumination of McBurney's point  and visualization of the ileocecal valve and appendiceal orifice.  The prep  was excellent.  The cecum, ascending, transverse, descending, and sigmoid  colon all appeared normal with no masses, polyps, diverticula, or other  mucosal abnormalities.  Within the rectum, there was a 5 mm polyp that was  fulgurated by hot biopsy.  There were a few scattered diverticula seen in  the sigmoid colon.  The scope was then withdrawn, and the patient returned  to the recovery room in stable condition.  She tolerated the procedure well,  and there were no immediate complications.   IMPRESSION:  1.  Small rectal polyp  2.  Sigmoid diverticulosis.   PLAN:  Repeat colonoscopy in 5 years.      JCH/MEDQ  D:  05/05/2004  T:  05/05/2004  Job:  [id number]   cc:   Tresa Endo L. Philipp Deputy, M.D.  [phone] [address]  [place] [address]  Fax: [phone]

## 2010-09-03 NOTE — Op Note (Signed)
NAMEFAREEDAH, MAHLER                ACCOUNT NO.:  [id number]   MEDICAL RECORD NO.:  [id number]          PATIENT TYPE:  AMB   LOCATION:  NESC                         FACILITY:  [facility]   PHYSICIAN:  Valetta Fuller, M.D.  DATE OF BIRTH:  09/24/1950   DATE OF PROCEDURE:  01/11/2006  DATE OF DISCHARGE:  01/11/2006                                 OPERATIVE REPORT   ADDENDUM:  At the end of the procedure and prior to waking up the patient from  anesthesia, a B&O suppository was inserted.     ______________________________  Cornelious Bryant, MD      Valetta Fuller, M.D.  Electronically Signed    SK/MEDQ  D:  01/11/2006  T:  01/13/2006  Job:  [id number]

## 2010-09-03 NOTE — Op Note (Signed)
[name], [name]                ACCOUNT NO.:  [id number]   MEDICAL RECORD NO.:  [id number]          PATIENT TYPE:  AMB   LOCATION:  NESC                         [facility]ACILITY:  [facility]   PHYSICIAN:  Valetta Fuller, M.D.  DATE OF BIRTH:  07/09/1950   DATE OF PROCEDURE:  01/11/2006  DATE OF DISCHARGE:  01/11/2006                                 OPERATIVE REPORT   PREOPERATIVE DIAGNOSIS:  Ongoing interstitial cystitis with microscopic  hematuria.   POSTOPERATIVE DIAGNOSIS:  Ongoing interstitial cystitis with microscopic  hematuria.   PROCEDURES PERFORMED:  Cystoscopy, hydrodistention and bilateral retrograde  pyelography.   SURGEON:  Dr. Barron Alvine   ASSISTANT:  Cornelious Bryant, MD.   ANESTHESIA:  General.   SPECIMENS:  None.   ESTIMATED BLOOD LOSS:  Minimal.   COMPLICATIONS:  None.   INDICATIONS:  This is a 60 year old lady who has been evaluated and treated  for interstitial cystitis.  The patient has a longstanding history of  irritative voiding symptoms, pelvic pain and dyspareunia.  The patient  presents to Dr. Ellin Goodie office recently with persistent pelvic pain.  Microscopic urinalysis did reveal microscopic hematuria.  Due to the chronic  status of her disease and the fact that she has a 35-year pack history of  smoking with a microscopic hematuria, cystoscopy, bilateral retrograde  pyelography, and hydrodistention  are warranted at this point as a  diagnostic and therapeutic options.   DESCRIPTION OF PROCEDURE:  The patient was brought to the operating room.  Preoperative antibiotics were given.  Time-out was taken to properly  identify the patient, procedure going to be done.  The patient was placed  and the dorsal lithotomy position after general anesthesia was induced.  She  was prepped and draped in normal sterile fashion.  Care was taken to avoid  neuropathy, compartment syndrome, and DVT.  A 22-French cystoscope was then  used to access the bladder.  Pan  cystoscopy of the bladder did not reveal  any masses, lesions or stones.  There were no evidence of glomerulations  prior to hydrodistention.   Bilateral retrograde pyelography:  A cone tip ureteral catheter was inserted  into the right ureteral orifice and contrast was injected.  Retrograde  pyelography was obtained on the right side.  The entire right collecting  system was opacified with no evidence of any suspicious masses or filling  defects.  The cone-tip was then removed and there was inserted into the left  ureteral orifice.  Contrast was injected and retrograde pyelography was  obtained on the left entire collecting system.  There were no evidence of  any suspicious masses or filling defects.   Hydrodistention:  Following the bilateral retrograde pyelography, the  patient was then hydrodistended with saline up to 1 liter.  The  hydrodistended bladder was kept at its capacity for about 5 minutes.  Following emptying of the bladder, there were numerous bleeding  glomerulations scattered throughout the bladder.  Multiple pictures were  taken and were put in the chart.  The bladder was then distended again for  about 30 seconds.  It was emptied  again.  There was no evidence of Hunter's  ulcers anywhere.  The bladder was then emptied and the scope was then taken  out.  Please note that the urethral mucosa was devoid of any masses, lesions  or stones.  A red rubber catheter was then inserted into the bladder and  Clorpactin was irrigated into the bladder.  About 300 mL of Clorpactin was  used.  The bladder was then emptied and was rinsed off with some saline.  50  mL of Marcaine were then injected into the bladder after the bladder was  emptied.  The red rubber was then taken out.  The procedure terminated with  no complication.  The patient was extubated in the operating room and taken  in stable condition to PACU.  Please note that Dr. Isabel Caprice was present and  participated in the  entire procedure as he was the responsible surgeon.     ______________________________  Terie Purser, MD      Valetta Fuller, M.D.  Electronically Signed    JH/MEDQ  D:  01/11/2006  T:  01/13/2006  Job:  [id number]

## 2010-09-03 NOTE — Op Note (Signed)
[name], [name]                ACCOUNT NO.:  [id number]   MEDICAL RECORD NO.:  [id number]          PATIENT TYPE:  AMB   LOCATION:  ENDO                         FACILITY:  [facility]   PHYSICIAN:  John C. Madilyn Fireman, M.D.    DATE OF BIRTH:  Aug 25, 1950   DATE OF PROCEDURE:  04/06/2004  DATE OF DISCHARGE:                                 OPERATIVE REPORT   PROCEDURE:  Esophagogastroduodenoscopy with biopsy.   INDICATIONS FOR PROCEDURE:  Reported history of Barrett's esophagus with  last surveillance endoscopy about five years ago.   DESCRIPTION OF PROCEDURE:  The patient was placed in the left lateral  decubitus position then placed on the pulse monitor with continuous low flow  oxygen delivered by nasal cannula. She was sedated with 100 mcg IV fentanyl  and 10 mg IV Versed. The Olympus video endoscope was advanced under direct  vision into the oropharynx and esophagus. The esophagus was straight and of  normal caliber with the squamocolumnar line at 38 cm. There was no visible  hiatal hernia, ring or stricture, however, the squamocolumnar line was  somewhat irregular and did appear to be on average about 3-4 cm above the  true lower esophageal sphincter consistent with Barrett's esophagus. There  were no focal erosions, ulcers or any visible suspicion of neoplasm. Four  biopsy specimens were taken. The stomach was entered and a small amount of  liquid secretions were suctioned from the fundus. Retroflexed view of the  cardia was unremarkable. The fundus, body, antrum and pylorus all appeared  normal. The duodenum was entered and both the bulb and second portion are  well inspected and appear to be within normal limits. The scope was then  withdrawn and the patient returned to the recovery room in stable condition.  She tolerated the procedure well and there were no immediate complications.   IMPRESSION:  Short segment Barrett's esophagus.   PLAN:  Await biopsy results to determine interval  for next surveillance  endoscopy.      JCH/MEDQ  D:  04/06/2004  T:  04/06/2004  Job:  [id number]   cc:   Tresa Endo L. Philipp Deputy, M.D.  [phone] [address] [place]  [place] [address]  Fax: [phone]

## 2010-12-15 ENCOUNTER — Ambulatory Visit
Admission: RE | Admit: 2010-12-15 | Discharge: 2010-12-15 | Disposition: A | Discharge status: Home / Self Care | Payer: Medicare Other | Source: Ambulatory Visit | Attending: Family Medicine | Admitting: Family Medicine

## 2010-12-15 ENCOUNTER — Other Ambulatory Visit: Payer: Self-pay | Admitting: Family Medicine

## 2010-12-15 DIAGNOSIS — R059 Cough, unspecified: Secondary | ICD-10-CM

## 2010-12-15 DIAGNOSIS — R05 Cough: Secondary | ICD-10-CM

## 2010-12-25 ENCOUNTER — Emergency Department (HOSPITAL_COMMUNITY)
Admission: EM | Admit: 2010-12-25 | Discharge: 2010-12-25 | Disposition: A | Discharge status: Home / Self Care | Payer: Medicare Other | Attending: Emergency Medicine | Admitting: Emergency Medicine

## 2010-12-25 DIAGNOSIS — G40909 Epilepsy, unspecified, not intractable, without status epilepticus: Secondary | ICD-10-CM | POA: Insufficient documentation

## 2010-12-25 DIAGNOSIS — L0231 Cutaneous abscess of buttock: Secondary | ICD-10-CM | POA: Insufficient documentation

## 2010-12-25 DIAGNOSIS — L03317 Cellulitis of buttock: Secondary | ICD-10-CM | POA: Insufficient documentation

## 2010-12-25 DIAGNOSIS — E7800 Pure hypercholesterolemia, unspecified: Secondary | ICD-10-CM | POA: Insufficient documentation

## 2011-01-10 LAB — POCT I-STAT 4, (NA,K, GLUC, HGB,HCT)
Glucose, Bld: 89
HCT: 44
Hemoglobin: 15
Operator id: 268271
Potassium: 4.2
Sodium: 138

## 2011-01-10 LAB — POCT HEMOGLOBIN-HEMACUE
Hemoglobin: 15.7 — ABNORMAL HIGH
Operator id: 268271

## 2011-01-17 LAB — COMPREHENSIVE METABOLIC PANEL WITH GFR
ALT: 117 — ABNORMAL HIGH
ALT: 143 — ABNORMAL HIGH
AST: 162 — ABNORMAL HIGH
AST: 237 — ABNORMAL HIGH
Albumin: 2.7 — ABNORMAL LOW
Albumin: 3.1 — ABNORMAL LOW
Alkaline Phosphatase: 45
Alkaline Phosphatase: 58
BUN: 12
BUN: 20
CO2: 22
CO2: 23
Calcium: 8 — ABNORMAL LOW
Calcium: 8.2 — ABNORMAL LOW
Chloride: 103
Chloride: 105
Creatinine, Ser: 1
Creatinine, Ser: 1.14
GFR calc non Af Amer: 49 — ABNORMAL LOW
GFR calc non Af Amer: 58 — ABNORMAL LOW
Glucose, Bld: 118 — ABNORMAL HIGH
Glucose, Bld: 123 — ABNORMAL HIGH
Potassium: 4
Potassium: 4.8
Sodium: 133 — ABNORMAL LOW
Sodium: 135
Total Bilirubin: 0.3
Total Bilirubin: 0.6
Total Protein: 5.2 — ABNORMAL LOW
Total Protein: 5.9 — ABNORMAL LOW

## 2011-01-17 LAB — CBC
HCT: 39.9
HCT: 46.9 — ABNORMAL HIGH
HCT: 48.4 — ABNORMAL HIGH
Hemoglobin: 13.4
Hemoglobin: 16.2 — ABNORMAL HIGH
Hemoglobin: 16.4 — ABNORMAL HIGH
MCHC: 33.7
MCHC: 33.9
MCHC: 34.6
MCV: 96.1
MCV: 96.4
MCV: 97.3
Platelets: 171
Platelets: 251
Platelets: 261
RBC: 4.1
RBC: 4.86
RBC: 5.04
RDW: 13.6
RDW: 13.7
RDW: 14
WBC: 11.6 — ABNORMAL HIGH
WBC: 15.1 — ABNORMAL HIGH
WBC: 15.2 — ABNORMAL HIGH

## 2011-01-17 LAB — URINE MICROSCOPIC-ADD ON

## 2011-01-17 LAB — BLOOD GAS, ARTERIAL
Acid-base deficit: 2
Bicarbonate: 21.5
FIO2: 0.21
Patient temperature: 98.6
TCO2: 22.5
pCO2 arterial: 32.3 — ABNORMAL LOW
pH, Arterial: 7.438 — ABNORMAL HIGH
pO2, Arterial: 69.2 — ABNORMAL LOW

## 2011-01-17 LAB — HEPATIC FUNCTION PANEL
ALT: 144 — ABNORMAL HIGH
AST: 292 — ABNORMAL HIGH
Albumin: 3.8
Alkaline Phosphatase: 61
Bilirubin, Direct: 0.1
Indirect Bilirubin: 0.8
Total Bilirubin: 0.9
Total Protein: 6.9

## 2011-01-17 LAB — CULTURE, BLOOD (ROUTINE X 2): Culture: NO GROWTH

## 2011-01-17 LAB — URINALYSIS, ROUTINE W REFLEX MICROSCOPIC
Glucose, UA: NEGATIVE
Ketones, ur: 15 — AB
Nitrite: NEGATIVE
Protein, ur: 100 — AB
Specific Gravity, Urine: 1.022
Urobilinogen, UA: 0.2
pH: 5

## 2011-01-17 LAB — AMMONIA: Ammonia: 27

## 2011-01-17 LAB — DIFFERENTIAL
Basophils Absolute: 0.1
Basophils Relative: 1
Eosinophils Absolute: 0
Eosinophils Relative: 0
Lymphocytes Relative: 9 — ABNORMAL LOW
Lymphs Abs: 1.3
Monocytes Absolute: 1.6 — ABNORMAL HIGH
Monocytes Relative: 10
Neutro Abs: 12.2 — ABNORMAL HIGH
Neutrophils Relative %: 81 — ABNORMAL HIGH

## 2011-01-17 LAB — URINE CULTURE
Colony Count: NO GROWTH
Culture: NO GROWTH

## 2011-01-17 LAB — TSH: TSH: 4.169

## 2011-01-17 LAB — POCT I-STAT, CHEM 8
BUN: 21
Calcium, Ion: 1.07 — ABNORMAL LOW
Chloride: 109
Creatinine, Ser: 1.1
Glucose, Bld: 131 — ABNORMAL HIGH
HCT: 50 — ABNORMAL HIGH
Hemoglobin: 17 — ABNORMAL HIGH
Potassium: 5.3 — ABNORMAL HIGH
Sodium: 138
TCO2: 23

## 2011-01-17 LAB — TRICYCLICS SCREEN, URINE: TCA Scrn: POSITIVE — AB

## 2011-01-17 LAB — POCT PREGNANCY, URINE: Preg Test, Ur: NEGATIVE

## 2011-01-17 LAB — RAPID URINE DRUG SCREEN, HOSP PERFORMED
Amphetamines: NOT DETECTED
Barbiturates: NOT DETECTED
Benzodiazepines: POSITIVE — AB
Cocaine: POSITIVE — AB
Opiates: NOT DETECTED
Tetrahydrocannabinol: NOT DETECTED

## 2011-01-17 LAB — FOLATE: Folate: 15.2

## 2011-01-17 LAB — SALICYLATE LEVEL: Salicylate Lvl: <4

## 2011-01-17 LAB — SYPHILIS: RPR W/REFLEX TO RPR TITER AND TREPONEMAL ANTIBODIES, TRADITIONAL SCREENING AND DIAGNOSIS ALGORITHM: RPR Ser Ql: NONREACTIVE

## 2011-01-17 LAB — ACETAMINOPHEN LEVEL

## 2011-01-17 LAB — ETHANOL

## 2011-01-17 LAB — GLUCOSE, CAPILLARY: Glucose-Capillary: 128 — ABNORMAL HIGH

## 2011-01-17 LAB — VITAMIN B12: Vitamin B-12: 560 (ref 211–911)

## 2011-01-18 LAB — POCT I-STAT, CHEM 8
BUN: 4 — ABNORMAL LOW
Calcium, Ion: 1.16
Chloride: 99
Creatinine, Ser: 1
Glucose, Bld: 90
HCT: 34 — ABNORMAL LOW
Hemoglobin: 11.6 — ABNORMAL LOW
Potassium: 3.7
Sodium: 136
TCO2: 28

## 2011-01-18 LAB — BASIC METABOLIC PANEL WITH GFR
BUN: 10
CO2: 23
Calcium: 8.4
Chloride: 101
Creatinine, Ser: 0.81
GFR calc non Af Amer: >60
Glucose, Bld: 111 — ABNORMAL HIGH
Potassium: 4.3
Sodium: 132 — ABNORMAL LOW

## 2011-01-18 LAB — RAPID URINE DRUG SCREEN, HOSP PERFORMED
Amphetamines: NOT DETECTED
Barbiturates: NOT DETECTED
Benzodiazepines: POSITIVE — AB
Cocaine: POSITIVE — AB
Opiates: NOT DETECTED
Tetrahydrocannabinol: NOT DETECTED

## 2011-01-18 LAB — URINALYSIS, ROUTINE W REFLEX MICROSCOPIC
Bilirubin Urine: NEGATIVE
Bilirubin Urine: NEGATIVE
Glucose, UA: NEGATIVE
Glucose, UA: NEGATIVE
Ketones, ur: NEGATIVE
Ketones, ur: NEGATIVE
Leukocytes, UA: NEGATIVE
Leukocytes, UA: NEGATIVE
Nitrite: NEGATIVE
Nitrite: NEGATIVE
Protein, ur: NEGATIVE
Protein, ur: NEGATIVE
Specific Gravity, Urine: 1.008
Specific Gravity, Urine: 1.004 — ABNORMAL LOW
Urobilinogen, UA: 0.2
Urobilinogen, UA: 0.2
pH: 6
pH: 6.5

## 2011-01-18 LAB — COMPREHENSIVE METABOLIC PANEL WITH GFR
ALT: 113 — ABNORMAL HIGH
AST: 139 — ABNORMAL HIGH
Albumin: 2.2 — ABNORMAL LOW
Alkaline Phosphatase: 57
BUN: 11
CO2: 26
Calcium: 8.1 — ABNORMAL LOW
Chloride: 109
Creatinine, Ser: 0.71
GFR calc non Af Amer: >60
Glucose, Bld: 94
Potassium: 4
Sodium: 139
Total Bilirubin: 0.3
Total Protein: 4.5 — ABNORMAL LOW

## 2011-01-18 LAB — URINE MICROSCOPIC-ADD ON

## 2011-01-18 LAB — CULTURE, BLOOD (ROUTINE X 2)
Culture: NO GROWTH
Culture: NO GROWTH

## 2011-01-18 LAB — CBC
HCT: 27.9 — ABNORMAL LOW
HCT: 30.2 — ABNORMAL LOW
Hemoglobin: 10.4 — ABNORMAL LOW
Hemoglobin: 9.6 — ABNORMAL LOW
MCHC: 34.4
MCHC: 34.4
MCV: 97.5
MCV: 97.9
Platelets: 157
Platelets: 177
RBC: 2.85 — ABNORMAL LOW
RBC: 3.1 — ABNORMAL LOW
RDW: 13.5
RDW: 13.5
WBC: 6
WBC: 9.8

## 2011-01-18 LAB — URINE CULTURE
Colony Count: NO GROWTH
Culture: NO GROWTH
Special Requests: NEGATIVE

## 2011-01-18 LAB — IRON AND TIBC
Iron: 44
Saturation Ratios: 18 — ABNORMAL LOW
TIBC: 238 — ABNORMAL LOW
UIBC: 194

## 2011-01-18 LAB — POCT HEMOGLOBIN-HEMACUE: Hemoglobin: 14.5

## 2011-01-18 LAB — FERRITIN: Ferritin: 223 (ref 10–291)

## 2011-01-18 LAB — CORTISOL: Cortisol, Plasma: 7.3

## 2011-01-26 LAB — POCT HEMOGLOBIN-HEMACUE
Hemoglobin: 15.1 — ABNORMAL HIGH
Operator id: 268271

## 2011-02-11 ENCOUNTER — Emergency Department (HOSPITAL_COMMUNITY)
Admission: EM | Admit: 2011-02-11 | Discharge: 2011-02-11 | Disposition: A | Discharge status: Home / Self Care | Payer: Medicare Other | Attending: Emergency Medicine | Admitting: Emergency Medicine

## 2011-02-11 DIAGNOSIS — T23029A Burn of unspecified degree of unspecified single finger (nail) except thumb, initial encounter: Secondary | ICD-10-CM | POA: Insufficient documentation

## 2011-02-11 DIAGNOSIS — X19XXXA Contact with other heat and hot substances, initial encounter: Secondary | ICD-10-CM | POA: Insufficient documentation

## 2011-02-11 DIAGNOSIS — T310 Burns involving less than 10% of body surface: Secondary | ICD-10-CM | POA: Insufficient documentation

## 2011-03-13 ENCOUNTER — Encounter: Payer: Self-pay | Admitting: *Deleted

## 2011-03-13 ENCOUNTER — Emergency Department (HOSPITAL_COMMUNITY)
Admission: EM | Admit: 2011-03-13 | Discharge: 2011-03-14 | Disposition: A | Discharge status: Home / Self Care | Payer: Medicare Other | Attending: Emergency Medicine | Admitting: Emergency Medicine

## 2011-03-13 DIAGNOSIS — M79609 Pain in unspecified limb: Secondary | ICD-10-CM | POA: Insufficient documentation

## 2011-03-13 DIAGNOSIS — M79646 Pain in unspecified finger(s): Secondary | ICD-10-CM

## 2011-03-13 HISTORY — DX: Unspecified convulsions: R56.9

## 2011-03-13 MED ORDER — OXYCODONE-ACETAMINOPHEN 5-325 MG PO TABS
1.0000 | ORAL_TABLET | Freq: Once | ORAL | Status: AC
  Administered 2011-03-13: 1 via ORAL
  Filled 2011-03-13: qty 1
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
## 2011-03-14 ENCOUNTER — Encounter (HOSPITAL_COMMUNITY): Payer: Self-pay | Admitting: *Deleted

## 2011-03-14 ENCOUNTER — Emergency Department (HOSPITAL_COMMUNITY)
Admission: EM | Admit: 2011-03-14 | Discharge: 2011-03-14 | Discharge status: Against Medical Advice | Payer: Medicare Other | Attending: Emergency Medicine | Admitting: Emergency Medicine

## 2011-03-14 DIAGNOSIS — M79609 Pain in unspecified limb: Secondary | ICD-10-CM | POA: Insufficient documentation

## 2011-03-14 MED ORDER — HYDROCODONE-ACETAMINOPHEN 5-500 MG PO TABS: 1.0000 | ORAL_TABLET | Freq: Four times a day (QID) | ORAL | Status: AC | PRN

## 2011-03-14 MED ORDER — DOXYCYCLINE HYCLATE 100 MG PO CAPS: 100.0000 mg | ORAL_CAPSULE | Freq: Two times a day (BID) | ORAL | Status: AC

## 2011-03-14 NOTE — Discharge Instructions (Signed)
 Take antibiotic in completion keeping finger clean with mild soap and water. Use Vicodin for pain. Follow up with Dr. Shari in the next few days for recheck of finger but return to ER for emergent changing or worsening of symptoms.
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
## 2011-03-14 NOTE — ED Provider Notes (Signed)
Medical screening examination/treatment/procedure(s) were performed by non-physician practitioner and as supervising physician I was immediately available for consultation/collaboration. 60 year old female complains of painful lesion on her index finger.  She states that she injured it yesterday.  On examination, on the medial aspect of her index finger.  She has a 1/2 cm vesicle.  There is no surrounding erythema or signs of infection.  We will aspirate fluid and release  Nicholes Stairs, MD 03/14/11 2306

## 2011-03-14 NOTE — ED Provider Notes (Signed)
Medical screening examination/treatment/procedure(s) were conducted as a shared visit with non-physician practitioner(s) and myself.  I personally evaluMedical screening examination/treatment/procedure(s) were performed by non-physician practitioner and as supervising physician I was immediately available for consultation/collaboration. 60 year old female complains of painful lesion on her index finger.  She states that she injured it yesterday.  On examination, on the medial aspect of her index finger.  She has a 1/2 cm vesicle.  There is no surrounding erythema or signs of infection.  We will aspirate fluid and release ated the patient during the encounter  Nicholes Stairs, MD 03/14/11 2258

## 2011-03-23 DIAGNOSIS — G540 Brachial plexus disorders: Secondary | ICD-10-CM | POA: Insufficient documentation

## 2011-04-19 DIAGNOSIS — Z8614 Personal history of Methicillin resistant Staphylococcus aureus infection: Secondary | ICD-10-CM

## 2011-04-19 HISTORY — DX: Personal history of Methicillin resistant Staphylococcus aureus infection: Z86.14

## 2011-05-13 ENCOUNTER — Encounter (HOSPITAL_COMMUNITY): Payer: Self-pay | Admitting: Emergency Medicine

## 2011-05-13 ENCOUNTER — Emergency Department (HOSPITAL_COMMUNITY)
Admission: EM | Admit: 2011-05-13 | Discharge: 2011-05-14 | Disposition: A | Discharge status: Home / Self Care | Payer: Medicare Other | Attending: Psychiatry | Admitting: Psychiatry

## 2011-05-13 DIAGNOSIS — T8140XA Infection following a procedure, unspecified, initial encounter: Secondary | ICD-10-CM | POA: Insufficient documentation

## 2011-05-13 DIAGNOSIS — Z79899 Other long term (current) drug therapy: Secondary | ICD-10-CM | POA: Insufficient documentation

## 2011-05-13 DIAGNOSIS — Y838 Other surgical procedures as the cause of abnormal reaction of the patient, or of later complication, without mention of misadventure at the time of the procedure: Secondary | ICD-10-CM | POA: Insufficient documentation

## 2011-05-13 DIAGNOSIS — T8149XA Infection following a procedure, other surgical site, initial encounter: Secondary | ICD-10-CM

## 2011-05-13 LAB — POCT I-STAT, CHEM 8
BUN: 13 mg/dL (ref 6–23)
Calcium, Ion: 1.16 mmol/L (ref 1.12–1.32)
Chloride: 106 meq/L (ref 96–112)
Creatinine, Ser: 0.8 mg/dL (ref 0.50–1.10)
Glucose, Bld: 86 mg/dL (ref 70–99)
HCT: 38 % (ref 36.0–46.0)
Hemoglobin: 12.9 g/dL (ref 12.0–15.0)
Potassium: 4 meq/L (ref 3.5–5.1)
Sodium: 141 meq/L (ref 135–145)
TCO2: 26 mmol/L (ref 0–100)

## 2011-05-13 LAB — CBC
HCT: 37.7 % (ref 36.0–46.0)
Hemoglobin: 13.1 g/dL (ref 12.0–15.0)
MCH: 31.2 pg (ref 26.0–34.0)
MCHC: 34.7 g/dL (ref 30.0–36.0)
MCV: 89.8 fL (ref 78.0–100.0)
Platelets: 297 10*3/uL (ref 150–400)
RBC: 4.2 MIL/uL (ref 3.87–5.11)
RDW: 13.9 % (ref 11.5–15.5)
WBC: 8.7 10*3/uL (ref 4.0–10.5)

## 2011-05-13 MED ORDER — SODIUM CHLORIDE 0.9 % IV SOLN
Freq: Once | INTRAVENOUS | Status: AC
  Administered 2011-05-13: 22:00:00 via INTRAVENOUS

## 2011-05-13 MED ORDER — MORPHINE SULFATE 4 MG/ML IJ SOLN
4.0000 mg | Freq: Once | INTRAMUSCULAR | Status: AC
  Administered 2011-05-13: 4 mg via INTRAVENOUS
  Filled 2011-05-13: qty 1

## 2011-05-13 MED ORDER — CLINDAMYCIN PHOSPHATE 600 MG/50ML IV SOLN
600.0000 mg | Freq: Once | INTRAVENOUS | Status: AC
  Administered 2011-05-13: 600 mg via INTRAVENOUS
  Filled 2011-05-13: qty 50

## 2011-05-13 MED ORDER — ONDANSETRON HCL 4 MG/2ML IJ SOLN
4.0000 mg | Freq: Once | INTRAMUSCULAR | Status: AC
  Administered 2011-05-13: 4 mg via INTRAVENOUS
  Filled 2011-05-13: qty 2

## 2011-05-13 NOTE — ED Notes (Signed)
Patient CO pain and itching to right shoulder. Hx of rotator cuff surgery 2 weeks ago. Patient has swollen, red incision approx. 6x8cm with yellow pus draining from right shoulder. Site redressed at this time with new ABD pad.

## 2011-05-13 NOTE — ED Notes (Signed)
Pt presents to ED, c/o wound infection to right upper back, brought per EMS, ambulates to triage.

## 2011-05-13 NOTE — ED Provider Notes (Addendum)
Medical screening examination/treatment/procedure(s) were performed by non-physician practitioner and as supervising physician I was immediately available for consultation/collaboration.   Benny Lennert, MD                                                    Pt has an infection in her incission in right shoulder.  pe swollen tender right shoulder.  Medical screening examination/treatment/procedure(s) were conducted as a shared visit with non-physician practitioner(s) and myself.  I personally evaluated the patient during the encounter  05/13/11 2132  Benny Lennert, MD 05/13/11 2308

## 2011-05-13 NOTE — ED Provider Notes (Addendum)
History     CSN: [id number]  Arrival date & time 05/13/11  1958   First MD Initiated Contact with Patient 05/13/11 2108      Chief Complaint  Patient presents with  . Drainage from Incision    Right    (Consider location/radiation/quality/duration/timing/severity/associated sxs/prior treatment) HPI Comments: Patient had rotator cuff surgery at [facility] 3 weeks ago.  Wound was healing well until 2 days ago, when she had increased pain, who is changing the dressings that last night.  It looked red and swollen.  Today.  There was a large amount of purulent drainage on the dressing.  She has not contacted her surgeon, Dr. Nedra Hai  at [facility]  The history is provided by the patient.    Past Medical History  Diagnosis Date  . Seizures     Past Surgical History  Procedure Date  . Shoulder arthroscopy     No family history on file.  History  Substance Use Topics  . Smoking status: Not on file  . Smokeless tobacco: Not on file  . Alcohol Use: No    OB History    Grav Para Term Preterm Abortions TAB SAB Ect Mult Living                  Review of Systems  Constitutional: Negative for fever and chills.  Respiratory: Negative for shortness of breath.   Gastrointestinal: Negative for nausea and vomiting.  Musculoskeletal: Negative for joint swelling.  Neurological: Negative for dizziness and weakness.    Allergies  Haloperidol decanoate  Home Medications   Current Outpatient Rx  Name Route Sig Dispense Refill  . AMITRIPTYLINE HCL 150 MG PO TABS Oral Take 150 mg by mouth at bedtime.    Marland Kitchen HYDROCODONE-ACETAMINOPHEN 10-500 MG PO TABS Oral Take 1 tablet by mouth every 6 (six) hours as needed. pain    . HYDROXYZINE HCL 25 MG PO TABS Oral Take 25 mg by mouth 3 (three) times daily as needed. itching    . LAMOTRIGINE 100 MG PO TABS Oral Take 100 mg by mouth daily.      Marland Kitchen SIMVASTATIN 20 MG PO TABS Oral Take 20 mg by mouth every evening.      BP 111/68  Pulse 79  Temp(Src)  98.3 F (36.8 C) (Oral)  Resp 18  Wt 130 lb (58.968 kg)  SpO2 98%  Physical Exam  Constitutional: She is oriented to person, place, and time. She appears well-developed and well-nourished.  HENT:  Head: Normocephalic.  Neck: Normal range of motion.  Cardiovascular: Normal rate.   Pulmonary/Chest: Effort normal.  Musculoskeletal:       Right shoulder: She exhibits tenderness and swelling. She exhibits normal range of motion, no effusion and no crepitus.       Arms: Neurological: She is alert and oriented to person, place, and time.  Skin: Skin is warm and dry.    ED Course  Procedures (including critical care time)   Labs Reviewed  WOUND CULTURE  CBC  I-STAT, CHEM 8   No results found.   No diagnosis found.    MDM  Wound, infection.  We'll culture the site.  Check CBC i-STAT 8 IV clindamycin and reevaluate Spoke with Dr. Tera Helper at [facility] orthopedic resident, who agrees patient most likely will need to be admitted for IV antibiotics.  He asked me to please call Dr. Josefine Class, his attending physician for official accepting this is up with Dr. Andrey Campanile who instructed me  to send the patient to the emergency department. Transported to [facility] has been delayed to to increment weather.  She is on the list for transport first thing in the morning        Arman Filter, NP 05/13/11 2122  Arman Filter, NP 05/13/11 4098  Arman Filter, NP 05/14/11 0400

## 2011-05-13 NOTE — ED Notes (Signed)
Patient provided with sandwich tray and soda at this time.

## 2011-05-13 NOTE — ED Notes (Signed)
Pt alert, presents to ED, c/o drainage from wound, right shoulder, "pinch nerve", sx performed by Dr Nedra Hai, resp even unlabored, skin pwd

## 2011-05-14 DIAGNOSIS — T8149XA Infection following a procedure, other surgical site, initial encounter: Secondary | ICD-10-CM | POA: Insufficient documentation

## 2011-05-14 MED ORDER — MORPHINE SULFATE 4 MG/ML IJ SOLN
4.0000 mg | Freq: Once | INTRAMUSCULAR | Status: AC
  Administered 2011-05-14: 4 mg via INTRAVENOUS
  Filled 2011-05-14: qty 1

## 2011-05-14 MED ORDER — NICOTINE 21 MG/24HR TD PT24
21.0000 mg | MEDICATED_PATCH | Freq: Once | TRANSDERMAL | Status: DC
  Administered 2011-05-14: 21 mg via TRANSDERMAL
  Filled 2011-05-14: qty 1

## 2011-05-14 MED ORDER — CLINDAMYCIN PHOSPHATE 600 MG/50ML IV SOLN: 600.0000 mg | Freq: Once | INTRAVENOUS | Status: DC

## 2011-05-14 MED ORDER — MORPHINE SULFATE 4 MG/ML IJ SOLN
4.0000 mg | Freq: Once | INTRAMUSCULAR | Status: AC
  Administered 2011-05-14: 4 mg via INTRAVENOUS

## 2011-05-14 MED ORDER — ONDANSETRON HCL 4 MG/2ML IJ SOLN
INTRAMUSCULAR | Status: AC
  Filled 2011-05-14: qty 2

## 2011-05-14 MED ORDER — MORPHINE SULFATE 4 MG/ML IJ SOLN
INTRAMUSCULAR | Status: AC
  Administered 2011-05-14: 4 mg via INTRAVENOUS
  Filled 2011-05-14: qty 1

## 2011-05-14 NOTE — ED Notes (Signed)
Pt discharged with carelink to [facility] Emergency Department. Pt received 4mg  morphine for pain control before leaving. Pt in NAD, VSS.

## 2011-05-14 NOTE — ED Notes (Signed)
Pt. Given a snack and 2 sodas.

## 2011-05-15 NOTE — ED Provider Notes (Signed)
Medical screening examination/treatment/procedure(s) were performed by non-physician practitioner and as supervising physician I was immediately available for consultation/collaboration.   Elleana Stillson L Lakita Sahlin, MD 05/15/11 1545 

## 2011-05-16 LAB — WOUND CULTURE: Special Requests: NORMAL

## 2011-05-16 NOTE — ED Notes (Signed)
Lab called wound culture + MRSA. Per chart patient was transferred to [facility]. Called [facility] and spoke with patient nurse Alleen Borne, RN and informed her of positive result. Copy of wound culture result faxed to [phone] attention Alleen Borne, RN.

## 2011-06-15 DIAGNOSIS — R350 Frequency of micturition: Secondary | ICD-10-CM | POA: Insufficient documentation

## 2011-07-10 ENCOUNTER — Encounter (HOSPITAL_COMMUNITY): Payer: Self-pay | Admitting: *Deleted

## 2011-07-10 ENCOUNTER — Emergency Department (HOSPITAL_COMMUNITY)
Admission: EM | Admit: 2011-07-10 | Discharge: 2011-07-10 | Disposition: A | Discharge status: Home / Self Care | Payer: Medicare Other | Attending: Emergency Medicine | Admitting: Emergency Medicine

## 2011-07-10 DIAGNOSIS — Z760 Encounter for issue of repeat prescription: Secondary | ICD-10-CM | POA: Insufficient documentation

## 2011-07-10 DIAGNOSIS — M25519 Pain in unspecified shoulder: Secondary | ICD-10-CM | POA: Insufficient documentation

## 2011-07-10 MED ORDER — HYDROCODONE-ACETAMINOPHEN 10-500 MG PO TABS: 1.0000 | ORAL_TABLET | Freq: Four times a day (QID) | ORAL | Status: AC | PRN

## 2011-07-10 MED ORDER — HYDROCODONE-ACETAMINOPHEN 5-325 MG PO TABS
2.0000 | ORAL_TABLET | Freq: Once | ORAL | Status: AC
  Administered 2011-07-10: 2 via ORAL
  Filled 2011-07-10: qty 2

## 2011-07-10 NOTE — ED Notes (Signed)
Patient is alert and oriented x3.  She was given DC instructions and follow up visit instructions.  Patient gave verbal understanding. She was DC ambulatory under his own power to home.  V/S stable.  He was not showing any signs of distress on DC 

## 2011-07-10 NOTE — ED Provider Notes (Signed)
History     CSN: [id number]  Arrival date & time 07/10/11  0114   First MD Initiated Contact with Patient 07/10/11 0159      Chief Complaint  Patient presents with  . Shoulder Pain    Rotator cuff repair 05/13/11, R side    (Consider location/radiation/quality/duration/timing/severity/associated sxs/prior treatment) HPI Comments: Patient history of right shoulder reconstruction in January consultation for MRSA infection is followed at [facility] for this.  At this time is well-healed, but she is out of her Vicodin, which she uses on a regular basis  Patient is a 61 y.o. female presenting with shoulder pain. The history is provided by the patient.  Shoulder Pain This is a chronic problem. The current episode started today. The problem occurs constantly. The problem has been unchanged. Associated symptoms include arthralgias. Pertinent negatives include no fever, rash or weakness.    Past Medical History  Diagnosis Date  . Seizures     Past Surgical History  Procedure Date  . Shoulder arthroscopy   . Rotator cuff repair     R side    History reviewed. No pertinent family history.  History  Substance Use Topics  . Smoking status: Not on file  . Smokeless tobacco: Not on file  . Alcohol Use: No    OB History    Grav Para Term Preterm Abortions TAB SAB Ect Mult Living                  Review of Systems  Constitutional: Negative for fever.  Musculoskeletal: Positive for arthralgias.  Skin: Negative for rash and wound.  Neurological: Negative for weakness.    Allergies  Haloperidol decanoate  Home Medications   Current Outpatient Rx  Name Route Sig Dispense Refill  . AMITRIPTYLINE HCL 150 MG PO TABS Oral Take 150 mg by mouth at bedtime.    Marland Kitchen CLONAZEPAM 0.5 MG PO TABS Oral Take 0.5 mg by mouth 2 (two) times daily as needed.    Marland Kitchen HYDROCODONE-ACETAMINOPHEN 10-500 MG PO TABS Oral Take 1 tablet by mouth every 6 (six) hours as needed. pain    . HYDROXYZINE HCL 25 MG  PO TABS Oral Take 25 mg by mouth 3 (three) times daily as needed. itching    . LAMOTRIGINE 100 MG PO TABS Oral Take 100 mg by mouth daily.     Marland Kitchen SIMVASTATIN 20 MG PO TABS Oral Take 20 mg by mouth every evening.    Marland Kitchen HYDROCODONE-ACETAMINOPHEN 10-500 MG PO TABS Oral Take 1 tablet by mouth every 6 (six) hours as needed for pain. 20 tablet 0    BP 112/56  Pulse 87  Temp(Src) 98.5 F (36.9 C) (Oral)  Resp 16  SpO2 96%  Physical Exam  Constitutional: She appears well-developed and well-nourished.  HENT:  Head: Normocephalic.  Eyes: Pupils are equal, round, and reactive to light.  Cardiovascular: Normal rate.   Pulmonary/Chest: Effort normal.  Musculoskeletal: She exhibits no edema and no tenderness.       Well-healed suture line, posterior right shoulder, full range of motion of the shoulder joint.  Distal pulses strong and bounding.  Color and temperature of extremity within normal limits  Neurological: She is alert.  Skin: Skin is warm.    ED Course  Procedures (including critical care time)  Labs Reviewed - No data to display No results found.   1. Medication refill       MDM  Medication refill        Arman Filter,  NP 07/10/11 0309  Arman Filter, NP 07/10/11 [phone]

## 2011-07-10 NOTE — ED Notes (Signed)
Pt c/o chronic R shoulder pain, unchanged since surgical repair in Jan of this year. Pt is out of vicodin.

## 2011-07-10 NOTE — Discharge Instructions (Signed)
Please make an appointment to follow up with Dr. Bruna Potter or your surgeons at [facility] for further refills of your Vicodin

## 2011-07-10 NOTE — ED Provider Notes (Signed)
Medical screening examination/treatment/procedure(s) were performed by non-physician practitioner and as supervising physician I was immediately available for consultation/collaboration.   Simran Bomkamp, MD 07/10/11 0704 

## 2011-08-17 ENCOUNTER — Other Ambulatory Visit: Payer: Self-pay | Admitting: Cardiovascular Disease

## 2011-08-17 ENCOUNTER — Ambulatory Visit: Payer: Medicare Other

## 2011-08-22 ENCOUNTER — Ambulatory Visit
Admission: RE | Admit: 2011-08-22 | Discharge: 2011-08-22 | Disposition: A | Discharge status: Home / Self Care | Payer: Medicare Other | Source: Ambulatory Visit | Attending: Cardiovascular Disease | Admitting: Cardiovascular Disease

## 2011-09-06 ENCOUNTER — Emergency Department (HOSPITAL_COMMUNITY)
Admission: EM | Admit: 2011-09-06 | Discharge: 2011-09-07 | Disposition: A | Discharge status: Home / Self Care | Payer: PRIVATE HEALTH INSURANCE | Attending: Emergency Medicine | Admitting: Emergency Medicine

## 2011-09-06 ENCOUNTER — Encounter (HOSPITAL_COMMUNITY): Payer: Self-pay

## 2011-09-06 DIAGNOSIS — F329 Major depressive disorder, single episode, unspecified: Secondary | ICD-10-CM | POA: Insufficient documentation

## 2011-09-06 DIAGNOSIS — F3289 Other specified depressive episodes: Secondary | ICD-10-CM | POA: Insufficient documentation

## 2011-09-06 DIAGNOSIS — F1410 Cocaine abuse, uncomplicated: Secondary | ICD-10-CM | POA: Insufficient documentation

## 2011-09-06 DIAGNOSIS — R4182 Altered mental status, unspecified: Secondary | ICD-10-CM

## 2011-09-06 DIAGNOSIS — Z79899 Other long term (current) drug therapy: Secondary | ICD-10-CM | POA: Insufficient documentation

## 2011-09-06 LAB — BLOOD GAS, ARTERIAL
Acid-Base Excess: 2.3 mmol/L — ABNORMAL HIGH (ref 0.0–2.0)
Bicarbonate: 26.1 meq/L — ABNORMAL HIGH (ref 20.0–24.0)
Drawn by: 331471
O2 Content: 2 L/min
O2 Saturation: 93.4 %
Patient temperature: 37
TCO2: 22.2 mmol/L (ref 0–100)
pCO2 arterial: 39.6 mmHg (ref 35.0–45.0)
pH, Arterial: 7.434 — ABNORMAL HIGH (ref 7.350–7.400)
pO2, Arterial: 61.2 mmHg — ABNORMAL LOW (ref 80.0–100.0)

## 2011-09-06 LAB — URINALYSIS, ROUTINE W REFLEX MICROSCOPIC
Bilirubin Urine: NEGATIVE
Glucose, UA: NEGATIVE mg/dL
Hgb urine dipstick: NEGATIVE
Ketones, ur: NEGATIVE mg/dL
Nitrite: NEGATIVE
Protein, ur: NEGATIVE mg/dL
Specific Gravity, Urine: 1.01 (ref 1.005–1.030)
Urobilinogen, UA: 0.2 mg/dL (ref 0.0–1.0)
pH: 7 (ref 5.0–8.0)

## 2011-09-06 LAB — DIFFERENTIAL
Basophils Absolute: 0.1 10*3/uL (ref 0.0–0.1)
Basophils Relative: 1 % (ref 0–1)
Eosinophils Absolute: 0.3 10*3/uL (ref 0.0–0.7)
Eosinophils Relative: 5 % (ref 0–5)
Lymphocytes Relative: 34 % (ref 12–46)
Lymphs Abs: 2.2 10*3/uL (ref 0.7–4.0)
Monocytes Absolute: 0.6 10*3/uL (ref 0.1–1.0)
Monocytes Relative: 9 % (ref 3–12)
Neutro Abs: 3.2 10*3/uL (ref 1.7–7.7)
Neutrophils Relative %: 51 % (ref 43–77)

## 2011-09-06 LAB — CBC
HCT: 36 % (ref 36.0–46.0)
Hemoglobin: 12.4 g/dL (ref 12.0–15.0)
MCH: 30.9 pg (ref 26.0–34.0)
MCHC: 34.4 g/dL (ref 30.0–36.0)
MCV: 89.8 fL (ref 78.0–100.0)
Platelets: 208 10*3/uL (ref 150–400)
RBC: 4.01 MIL/uL (ref 3.87–5.11)
RDW: 13.4 % (ref 11.5–15.5)
WBC: 6.4 10*3/uL (ref 4.0–10.5)

## 2011-09-06 LAB — COMPREHENSIVE METABOLIC PANEL WITH GFR
ALT: 15 U/L (ref 0–35)
AST: 16 U/L (ref 0–37)
Albumin: 3.3 g/dL — ABNORMAL LOW (ref 3.5–5.2)
Alkaline Phosphatase: 74 U/L (ref 39–117)
BUN: 7 mg/dL (ref 6–23)
CO2: 23 meq/L (ref 19–32)
Calcium: 8.8 mg/dL (ref 8.4–10.5)
Chloride: 100 meq/L (ref 96–112)
Creatinine, Ser: 0.94 mg/dL (ref 0.50–1.10)
GFR calc Af Amer: 76 mL/min — ABNORMAL LOW
GFR calc non Af Amer: 66 mL/min — ABNORMAL LOW
Glucose, Bld: 151 mg/dL — ABNORMAL HIGH (ref 70–99)
Potassium: 3.3 meq/L — ABNORMAL LOW (ref 3.5–5.1)
Sodium: 135 meq/L (ref 135–145)
Total Bilirubin: 0.2 mg/dL — ABNORMAL LOW (ref 0.3–1.2)
Total Protein: 6.1 g/dL (ref 6.0–8.3)

## 2011-09-06 LAB — RAPID URINE DRUG SCREEN, HOSP PERFORMED
Amphetamines: NOT DETECTED
Barbiturates: NOT DETECTED
Benzodiazepines: NOT DETECTED
Cocaine: POSITIVE — AB
Opiates: NOT DETECTED
Tetrahydrocannabinol: NOT DETECTED

## 2011-09-06 LAB — ACETAMINOPHEN LEVEL: Acetaminophen (Tylenol), Serum: <15 ug/mL (ref 10–30)

## 2011-09-06 LAB — SALICYLATE LEVEL: Salicylate Lvl: <2 mg/dL — ABNORMAL LOW (ref 2.8–20.0)

## 2011-09-06 LAB — URINE MICROSCOPIC-ADD ON

## 2011-09-06 LAB — LACTIC ACID, PLASMA: Lactic Acid, Venous: 1.4 mmol/L (ref 0.5–2.2)

## 2011-09-06 LAB — GLUCOSE, CAPILLARY: Glucose-Capillary: 115 mg/dL — ABNORMAL HIGH (ref 70–99)

## 2011-09-06 LAB — PROCALCITONIN: Procalcitonin: <0.1 ng/mL

## 2011-09-06 LAB — AMMONIA: Ammonia: 45 umol/L (ref 11–60)

## 2011-09-06 LAB — ETHANOL: Alcohol, Ethyl (B): <11 mg/dL (ref 0–11)

## 2011-09-06 MED ORDER — NALOXONE HCL 1 MG/ML IJ SOLN
1.0000 mg | Freq: Once | INTRAMUSCULAR | Status: AC
  Administered 2011-09-06: 1 mg via INTRAVENOUS

## 2011-09-06 MED ORDER — AMMONIA AROMATIC IN INHA
RESPIRATORY_TRACT | Status: AC
  Administered 2011-09-06: 18:00:00
  Filled 2011-09-06: qty 20

## 2011-09-06 MED ORDER — NALOXONE HCL 1 MG/ML IJ SOLN
INTRAMUSCULAR | Status: AC
  Administered 2011-09-06: 1 mg via INTRAVENOUS
  Filled 2011-09-06: qty 2

## 2011-09-06 MED ORDER — SODIUM CHLORIDE 0.9 % IV SOLN
1000.0000 mL | INTRAVENOUS | Status: DC
  Administered 2011-09-06 – 2011-09-07 (×2): 1000 mL via INTRAVENOUS

## 2011-09-06 NOTE — ED Notes (Signed)
Pt went to sleep while talking with me several times

## 2011-09-06 NOTE — ED Notes (Addendum)
Pt unable to follow commands. Will answer basic questions. States she takes Hydrocodone.  Clonazepam 0.5 mg,  Simvastatin 20 mg, Lamotrigine 100 mg in patient's bag (open) 4 Klonopin remaining in container (90 pill prescription) filled on 08/27/11. MD notified.

## 2011-09-06 NOTE — ED Provider Notes (Signed)
History     CSN: [id number]  Arrival date & time 09/06/11  1631   First MD Initiated Contact with Patient 09/06/11 1710      Chief Complaint  Patient presents with  . Altered Mental Status    (Consider location/radiation/quality/duration/timing/severity/associated sxs/prior treatment) HPI Comments: Naydene Kamrowski is a 61 y.o. Female who presents byprivate vehicle with altered mental status. Initial vital signs were normal. The patient was unwilling or unable to give history.  Level V Caveat-  Altered Mental Status  Patient is a 61 y.o. female presenting with altered mental status. The history is provided by the patient.  Altered Mental Status    Past Medical History  Diagnosis Date  . Seizures     Past Surgical History  Procedure Date  . Shoulder arthroscopy   . Rotator cuff repair     R side    History reviewed. No pertinent family history.  History  Substance Use Topics  . Smoking status: Not on file  . Smokeless tobacco: Not on file  . Alcohol Use: No    OB History    Grav Para Term Preterm Abortions TAB SAB Ect Mult Living                  Review of Systems  Unable to perform ROS Psychiatric/Behavioral: Positive for altered mental status.  All other systems reviewed and are negative.    Allergies  Haloperidol decanoate  Home Medications   Current Outpatient Rx  Name Route Sig Dispense Refill  . AMITRIPTYLINE HCL 150 MG PO TABS Oral Take 150 mg by mouth at bedtime.    Marland Kitchen CLONAZEPAM 0.5 MG PO TABS Oral Take 0.5 mg by mouth 2 (two) times daily as needed.    Marland Kitchen HYDROCODONE-ACETAMINOPHEN 10-500 MG PO TABS Oral Take 1 tablet by mouth every 6 (six) hours as needed. pain    . HYDROXYZINE HCL 25 MG PO TABS Oral Take 25 mg by mouth 3 (three) times daily as needed. itching    . LAMOTRIGINE 100 MG PO TABS Oral Take 100 mg by mouth daily.     Marland Kitchen SIMVASTATIN 20 MG PO TABS Oral Take 20 mg by mouth every evening.      BP 106/68  Pulse 67  Temp(Src) 98.6 F  (37 C) (Oral)  Resp 18  SpO2 96%  Physical Exam  Nursing note and vitals reviewed. Constitutional: She appears well-developed and well-nourished.  HENT:  Head: Normocephalic and atraumatic.       Mucous membranes are dry  Eyes: Conjunctivae and EOM are normal. Pupils are equal, round, and reactive to light.       Pupils 2-3 mm bilaterally, and minimally reactive  Neck: Normal range of motion and phonation normal. Neck supple.  Cardiovascular: Normal rate, regular rhythm and intact distal pulses.   Pulmonary/Chest: Effort normal and breath sounds normal. She exhibits no tenderness.  Abdominal: Soft. She exhibits no distension. There is no tenderness. There is no guarding.  Musculoskeletal: Normal range of motion.  Neurological: She is alert. She has normal strength. No cranial nerve deficit. She exhibits normal muscle tone. Coordination normal.       Patient somnolent; after ammonia stimulation she was able to talk, but had dysarthria. She then began to fall back asleep after 5 minutes  Skin: Skin is warm and dry.  Psychiatric:       Appears depressed    ED Course  Procedures (including critical care time)  Lethargy, treated first with ammonia challenge to  the bilateral nares. This was transiently helpful. Narcan 1 mg was given with improvement, and lasting wakeful state of 20 minutes as of 17:45  Further evaluation for sources of altered mental status is undertaken  CRITICAL CARE Performed by: Flint Melter   Total critical care time: 40  Critical care time was exclusive of separately billable procedures and treating other patients.  Critical care was necessary to treat or prevent imminent or life-threatening deterioration.  Critical care was time spent personally by me on the following activities: development of treatment plan with patient and/or surrogate as well as nursing, discussions with consultants, evaluation of patient's response to treatment, examination of  patient, obtaining history from patient or surrogate, ordering and performing treatments and interventions, ordering and review of laboratory studies, ordering and review of radiographic studies, pulse oximetry and re-evaluation of patient's condition.  23:50- patient is now asleep, but arouses easily and is alert, calm, cooperative. She states that she took too much cocaine earlier today. She denies an intent to harm herself. She states she lives alone in an apartment. Repeat vital signs are normal.    Labs Reviewed  GLUCOSE, CAPILLARY - Abnormal; Notable for the following:    Glucose-Capillary 115 (*)    All other components within normal limits  COMPREHENSIVE METABOLIC PANEL - Abnormal; Notable for the following:    Potassium 3.3 (*)    Glucose, Bld 151 (*)    Albumin 3.3 (*)    Total Bilirubin 0.2 (*)    GFR calc non Af Amer 66 (*)    GFR calc Af Amer 76 (*)    All other components within normal limits  URINE RAPID DRUG SCREEN (HOSP PERFORMED) - Abnormal; Notable for the following:    Cocaine POSITIVE (*)    All other components within normal limits  URINALYSIS, ROUTINE W REFLEX MICROSCOPIC - Abnormal; Notable for the following:    Leukocytes, UA TRACE (*)    All other components within normal limits  BLOOD GAS, ARTERIAL - Abnormal; Notable for the following:    pH, Arterial 7.434 (*)    pO2, Arterial 61.2 (*)    Bicarbonate 26.1 (*)    Acid-Base Excess 2.3 (*)    All other components within normal limits  SALICYLATE LEVEL - Abnormal; Notable for the following:    Salicylate Lvl <2.0 (*)    All other components within normal limits  AMMONIA  CBC  DIFFERENTIAL  ETHANOL  LACTIC ACID, PLASMA  PROCALCITONIN  ACETAMINOPHEN LEVEL  URINE MICROSCOPIC-ADD ON  URINE CULTURE   No results found.  Date: 09/07/2011  Rate: 77  Rhythm: normal sinus rhythm  QRS Axis: normal  Intervals: normal  ST/T Wave abnormalities: normal  Conduction Disutrbances:nonspecific intraventricular  conduction delay  Narrative Interpretation:   Old EKG Reviewed: unchanged   1. Cocaine abuse   2. Altered mental status       MDM  Altered mental status, secondary to substance abuse. Doubt suicidal drug overdose, metabolic instability serious bacterial infection or impending vascular collapse.   Plan: Home Medications- usual; Home Treatments- none; Recommended follow up- PCP  of choice prn        Flint Melter, MD 09/09/11 1640

## 2011-09-06 NOTE — ED Notes (Addendum)
Pt in presenting with altered mental status, pt with slurred speech and falling asleep while being assessed, pt answers to questions are inappropriate, pt alert to person, not to place or time, unsure how patient arrived or how long symptoms have been going on, pt denies ETOH or drug use, unsure of original complaint that brought patient to ED (Assessment and triage completed by Nathaniel Man RN)

## 2011-09-06 NOTE — ED Notes (Signed)
MD Effie Shy notified of patient status

## 2011-09-06 NOTE — ED Notes (Signed)
Pt reports using cocaine

## 2011-09-06 NOTE — ED Notes (Signed)
Into assess vitals, the patient voiced needing to to go the restroom. Pt ambulatory to restroom with standby assist. Pt voices that she had not slept in days and took the pills "Narcotics" to help her sleep. She voices falling at some point.

## 2011-09-06 NOTE — ED Notes (Signed)
Pt able to state her name and that she is at the hospital. States she "fell down stairs" today

## 2011-09-07 LAB — POCT I-STAT, CHEM 8
BUN: 7 mg/dL (ref 6–23)
Calcium, Ion: 1.14 mmol/L (ref 1.12–1.32)
Chloride: 105 meq/L (ref 96–112)
Creatinine, Ser: 1.1 mg/dL (ref 0.50–1.10)
Glucose, Bld: 95 mg/dL (ref 70–99)
HCT: 36 % (ref 36.0–46.0)
Hemoglobin: 12.2 g/dL (ref 12.0–15.0)
Potassium: 3.8 meq/L (ref 3.5–5.1)
Sodium: 142 meq/L (ref 135–145)
TCO2: 27 mmol/L (ref 0–100)

## 2011-09-07 MED ORDER — CLONAZEPAM 0.5 MG PO TABS
0.5000 mg | ORAL_TABLET | Freq: Two times a day (BID) | ORAL | Status: DC
  Administered 2011-09-07: 0.5 mg via ORAL
  Filled 2011-09-07: qty 1

## 2011-09-07 NOTE — Discharge Instructions (Signed)
Cocaine Abuse  PROBLEMS FROM USING COCAINE:    Highly addictive.   Illegal.   Risk of sudden death.   Heart disease.   Irregular heart beat.   High blood pressure.   Damage to nose and lungs.   Severe agitation.   Hallucinations.   Violent behavior.   Paranoia.   Sexual dysfunction.  Most cocaine users deny that they have a problem with addiction. The biggest problem is admitting that you are dependent on cocaine. Those trying to quit using it may experience depression and withdrawal symptoms.  Other withdrawal symptoms include fatigue, suicidal thoughts, sleepiness, restlessness, anxiety, and increased craving for cocaine.  There are medications available to help prevent depression associated with stopping cocaine. Most users will find a support group or treatment program helpful in coming off and staying off cocaine. The best chance to cure cocaine addiction is to go into group therapy and to be in a drug-free environment. It is very important to develop healthy relationships and avoid socializing with people who use or deal drugs.  Eat well, and give your body the proper rest and healthy exercise it needs. You may need medication to help treat withdrawal symptoms. Call your caregiver or a drug treatment center for more help.   You may also want to call the National Institute on Drug Abuse at 800-662-HELP in the [place]  SEEK IMMEDIATE MEDICAL CARE IF:   You develop severe chest pain.   You develop shortness of breath.   You develop extreme agitation.  Document Released: 05/12/2004 Document Revised: 03/24/2011 Document Reviewed: 02/04/2009  [employer] Patient Information 2012 [employer].

## 2011-09-07 NOTE — ED Provider Notes (Signed)
History     CSN: [id number]  Arrival date & time 09/06/11  1631   First MD Initiated Contact with Patient 09/06/11 1710      Chief Complaint  Patient presents with  . Altered Mental Status    (Consider location/radiation/quality/duration/timing/severity/associated sxs/prior treatment) HPI Patient with history of mrsa after rotator cuff surgery living at home alone.  Patient unclear how she got here but arrived here via ems with altered mental status.  She states smoked cocaine two days ago and taking hydrocodone and klonopin.  Thinks she took an accidental overdose of klonopin.  Denies suicidal ideation.  Denies alcohol intake.  History of depression and states history of suicidal attempts last at age 43 and due to alcohol abuse.  Seizure three days ago- states history of seizures.  Trated by Dr. Demetrius Charity at [facility].  PMD is Dr.Blount.   Past Medical History  Diagnosis Date  . Seizures     Past Surgical History  Procedure Date  . Shoulder arthroscopy   . Rotator cuff repair     R side    History reviewed. No pertinent family history.  History  Substance Use Topics  . Smoking status: Not on file  . Smokeless tobacco: Not on file  . Alcohol Use: No    OB History    Grav Para Term Preterm Abortions TAB SAB Ect Mult Living                  Review of Systems  Allergies  Haloperidol decanoate  Home Medications   Current Outpatient Rx  Name Route Sig Dispense Refill  . AMITRIPTYLINE HCL 150 MG PO TABS Oral Take 150 mg by mouth at bedtime.    Marland Kitchen VITAMIN D 1000 UNITS PO TABS Oral Take 2,000 Units by mouth See admin instructions. Patient states that she takes up to 3 times per day.    Marland Kitchen CLONAZEPAM 0.5 MG PO TABS Oral Take 0.5 mg by mouth 2 (two) times daily as needed. For anxiety    . HYDROCODONE-ACETAMINOPHEN 10-500 MG PO TABS Oral Take 1 tablet by mouth every 6 (six) hours as needed. pain    . HYDROXYZINE HCL 25 MG PO TABS Oral Take 25 mg by mouth 3 (three) times  daily as needed. itching    . LAMOTRIGINE 100 MG PO TABS Oral Take 100 mg by mouth daily.     Marland Kitchen SIMVASTATIN 20 MG PO TABS Oral Take 20 mg by mouth every evening.      BP 109/72  Pulse 74  Temp(Src) 98.7 F (37.1 C) (Oral)  Resp 18  SpO2 95%  Physical Exam  ED Course  Procedures (including critical care time)  Labs Reviewed  GLUCOSE, CAPILLARY - Abnormal; Notable for the following:    Glucose-Capillary 115 (*)    All other components within normal limits  COMPREHENSIVE METABOLIC PANEL - Abnormal; Notable for the following:    Potassium 3.3 (*)    Glucose, Bld 151 (*)    Albumin 3.3 (*)    Total Bilirubin 0.2 (*)    GFR calc non Af Amer 66 (*)    GFR calc Af Amer 76 (*)    All other components within normal limits  URINE RAPID DRUG SCREEN (HOSP PERFORMED) - Abnormal; Notable for the following:    Cocaine POSITIVE (*)    All other components within normal limits  URINALYSIS, ROUTINE W REFLEX MICROSCOPIC - Abnormal; Notable for the following:    Leukocytes, UA TRACE (*)  All other components within normal limits  BLOOD GAS, ARTERIAL - Abnormal; Notable for the following:    pH, Arterial 7.434 (*)    pO2, Arterial 61.2 (*)    Bicarbonate 26.1 (*)    Acid-Base Excess 2.3 (*)    All other components within normal limits  SALICYLATE LEVEL - Abnormal; Notable for the following:    Salicylate Lvl <2.0 (*)    All other components within normal limits  AMMONIA  CBC  DIFFERENTIAL  ETHANOL  LACTIC ACID, PLASMA  PROCALCITONIN  ACETAMINOPHEN LEVEL  URINE MICROSCOPIC-ADD ON  URINE CULTURE   No results found.   1. Cocaine abuse   2. Altered mental status       MDM  Patient awake and alert. She denies having had any history of suicidal ideation. She requests to go home. She will be discharged to home and advised to followup for substance abuse.       Hilario Quarry, MD 09/10/11 [phone]

## 2011-09-08 LAB — URINE CULTURE
Colony Count: NO GROWTH
Culture  Setup Time: 201305220408
Culture: NO GROWTH

## 2011-09-23 DIAGNOSIS — M7550 Bursitis of unspecified shoulder: Secondary | ICD-10-CM | POA: Insufficient documentation

## 2011-09-23 DIAGNOSIS — M25519 Pain in unspecified shoulder: Secondary | ICD-10-CM | POA: Insufficient documentation

## 2011-11-01 ENCOUNTER — Emergency Department (HOSPITAL_COMMUNITY)
Admission: EM | Admit: 2011-11-01 | Discharge: 2011-11-01 | Disposition: A | Discharge status: Home / Self Care | Payer: PRIVATE HEALTH INSURANCE | Attending: Emergency Medicine | Admitting: Emergency Medicine

## 2011-11-01 ENCOUNTER — Encounter (HOSPITAL_COMMUNITY): Payer: Self-pay | Admitting: *Deleted

## 2011-11-01 DIAGNOSIS — F1410 Cocaine abuse, uncomplicated: Secondary | ICD-10-CM | POA: Insufficient documentation

## 2011-11-01 DIAGNOSIS — G40909 Epilepsy, unspecified, not intractable, without status epilepticus: Secondary | ICD-10-CM

## 2011-11-01 DIAGNOSIS — Z79899 Other long term (current) drug therapy: Secondary | ICD-10-CM | POA: Insufficient documentation

## 2011-11-01 LAB — URINALYSIS, ROUTINE W REFLEX MICROSCOPIC
Bilirubin Urine: NEGATIVE
Glucose, UA: NEGATIVE mg/dL
Hgb urine dipstick: NEGATIVE
Ketones, ur: NEGATIVE mg/dL
Nitrite: NEGATIVE
Protein, ur: NEGATIVE mg/dL
Specific Gravity, Urine: 1.014 (ref 1.005–1.030)
Urobilinogen, UA: 0.2 mg/dL (ref 0.0–1.0)
pH: 6.5 (ref 5.0–8.0)

## 2011-11-01 LAB — POCT I-STAT, CHEM 8
BUN: 6 mg/dL (ref 6–23)
Calcium, Ion: 1.13 mmol/L (ref 1.12–1.23)
Chloride: 99 meq/L (ref 96–112)
Creatinine, Ser: 0.9 mg/dL (ref 0.50–1.10)
Glucose, Bld: 97 mg/dL (ref 70–99)
HCT: 37 % (ref 36.0–46.0)
Hemoglobin: 12.6 g/dL (ref 12.0–15.0)
Potassium: 3.4 meq/L — ABNORMAL LOW (ref 3.5–5.1)
Sodium: 139 meq/L (ref 135–145)
TCO2: 23 mmol/L (ref 0–100)

## 2011-11-01 LAB — RAPID URINE DRUG SCREEN, HOSP PERFORMED
Amphetamines: NOT DETECTED
Barbiturates: NOT DETECTED
Benzodiazepines: POSITIVE — AB
Cocaine: POSITIVE — AB
Opiates: NOT DETECTED
Tetrahydrocannabinol: NOT DETECTED

## 2011-11-01 LAB — URINE MICROSCOPIC-ADD ON

## 2011-11-01 NOTE — ED Notes (Addendum)
Pt admitted to taking Klonopin and Cocaine when she realized that we were going to take urine sample. Stated that she would pee when she got done eating.

## 2011-11-01 NOTE — ED Provider Notes (Signed)
History     CSN: [id number]  Arrival date & time 11/01/11  1554   First MD Initiated Contact with Patient 11/01/11 1711      Chief Complaint  Patient presents with  . Seizures    (Consider location/radiation/quality/duration/timing/severity/associated sxs/prior treatment) HPI Comments: [name] is a 61 y.o. Female was here for evaluation of seizures. She states that she has had 2 seizures in the last 3 days. She denies using cocaine recently, and states that she has been sleeping well. She has no weakness, dizziness, nausea, vomiting, chest pain, shortness of breath, abdominal pain, or change in bowel and urinary habits; there are no aggravating or palliative factors.  Patient is a 61 y.o. female presenting with seizures. The history is provided by the patient.  Seizures     Past Medical History  Diagnosis Date  . Seizures     Past Surgical History  Procedure Date  . Shoulder arthroscopy   . Rotator cuff repair     R side    No family history on file.  History  Substance Use Topics  . Smoking status: Not on file  . Smokeless tobacco: Not on file  . Alcohol Use: No    OB History    Grav Para Term Preterm Abortions TAB SAB Ect Mult Living                  Review of Systems  Neurological: Positive for seizures.  All other systems reviewed and are negative.    Allergies  Haloperidol decanoate  Home Medications   Current Outpatient Rx  Name Route Sig Dispense Refill  . AMITRIPTYLINE HCL 150 MG PO TABS Oral Take 150 mg by mouth at bedtime.    Marland Kitchen VITAMIN D 1000 UNITS PO TABS Oral Take 2,000 Units by mouth See admin instructions.     . CLONAZEPAM 0.5 MG PO TABS Oral Take 0.5 mg by mouth 2 (two) times daily as needed. For anxiety    . HYDROXYZINE HCL 25 MG PO TABS Oral Take 25 mg by mouth 3 (three) times daily as needed. itching    . LAMOTRIGINE 100 MG PO TABS Oral Take 100 mg by mouth 2 (two) times daily.     Marland Kitchen SIMVASTATIN 20 MG PO TABS Oral Take 20 mg by  mouth every evening.      BP 102/59  Pulse 78  Temp 98 F (36.7 C) (Oral)  Resp 16  SpO2 94%  Physical Exam  Nursing note and vitals reviewed. Constitutional: She is oriented to person, place, and time. She appears well-developed and well-nourished.       Patient sleepy, required, shaking, to arouse her.  HENT:  Head: Normocephalic and atraumatic.  Eyes: Conjunctivae and EOM are normal. Pupils are equal, round, and reactive to light.  Neck: Normal range of motion and phonation normal. Neck supple.  Cardiovascular: Normal rate, regular rhythm and intact distal pulses.   Pulmonary/Chest: Effort normal and breath sounds normal. She exhibits no tenderness.  Abdominal: Soft. She exhibits no distension. There is no tenderness. There is no guarding.  Musculoskeletal: Normal range of motion.  Neurological: She is alert and oriented to person, place, and time. She has normal strength. She exhibits normal muscle tone.  Skin: Skin is warm and dry.  Psychiatric: She has a normal mood and affect. Her behavior is normal. Judgment and thought content normal.    ED Course  Procedures (including critical care time)  Labs Reviewed  URINALYSIS, ROUTINE W REFLEX MICROSCOPIC -  Abnormal; Notable for the following:    APPearance CLOUDY (*)     Leukocytes, UA SMALL (*)     All other components within normal limits  URINE RAPID DRUG SCREEN (HOSP PERFORMED) - Abnormal; Notable for the following:    Cocaine POSITIVE (*)     Benzodiazepines POSITIVE (*)     All other components within normal limits  POCT I-STAT, CHEM 8 - Abnormal; Notable for the following:    Potassium 3.4 (*)     All other components within normal limits  URINE MICROSCOPIC-ADD ON - Abnormal; Notable for the following:    Squamous Epithelial / LPF FEW (*)     Bacteria, UA FEW (*)     All other components within normal limits   No results found.   1. Cocaine abuse   2. Seizure disorder       MDM   Possible seizure, with  known seizure disorder. Abuse of cocaine raises her potential for seizures. No significant metabolic abnormality. Patient stable for discharge.   Plan: Home Medications- usual; Home Treatments- rest, stop cocaine; Recommended follow up- PCP prn      Flint Melter, MD 11/01/11 2341

## 2011-11-01 NOTE — Discharge Instructions (Signed)
 Stop using cocaine. It will cause you to have seizures.    Cocaine Abuse and Chemical Dependency WHEN IS DRUG USE A PROBLEM? Anytime drug use is interfering with normal living activities it has become abuse. This includes problems with family and friends. Psychological dependence has developed when your mind tells you that the drug is needed. This is usually followed by physical dependence which has developed when continuing increases of drug are required to get the same feeling or high. This is known as addiction or chemical dependency. A person's risk is much higher if there is a history of chemical dependency in the family. SIGNS OF CHEMICAL DEPENDENCY:  Been told by friends or family that drugs have become a problem.   Fighting when using drugs.   Having blackouts (not remembering what you do while using).   Feel sick from using drugs but continue using.   Lie about use or amounts of drugs (chemicals) used.   Need chemicals to get you going.   Suffer in work international aid/development worker or school because of drug use.   Get sick from use of drugs but continue to use anyway.   Need drugs to relate to people or feel comfortable in social situations.   Use drugs to forget problems.  Yes answered to any of the above signs of chemical dependency indicates there are problems. The longer the use of drugs continues, the greater the problems will become. If there is a family history of drug or alcohol  use it is best not to experiment with these drugs. Experimentation leads to tolerance and needing to use more of the drug to get the same feeling. This is followed by addiction where drugs become the most important part of life. It becomes more important to take drugs than participate in the other usual activities of life including relating to friends and family. Addiction is followed by dependency where drugs are now needed not just to get high but to feel normal. Addiction cannot be cured but it can be  stopped. This often requires outside help and the care of professionals. Treatment centers are listed in the yellow pages under: Cocaine, Narcotics, and Alcoholics Anonymous. Most hospitals and clinics can refer you to a specialized care center. WHAT IS COCAINE? Cocaine is a strong nervous system stimulant which speeds up the body and gives the user the feeling that they have increased energy, loss of appetite and feelings of great pleasure. This high which begins within several minutes and lasts for less than an hour is followed by a crash. The crash and depressed feelings that come with it cause a craving for the drug to regain the high. HOW IS COCANINE USED? Cocaine is snorted, injected, and smoked as free- base or crack. Because smoking the drug produces a greater high it is also associated with a greater low. It is therefore more rapidly addicting. WHAT ARE THE EFFECTS OF COCAINE? It is an anesthetic (pain killer) and a stimulant (it causes a high which gives a false feeling of well being). It increases heart and breathing rates with increases in body temperature and blood pressure. It removes appetite. It causes seizures (convulsions) along with nausea (feeling sick to your stomach), vomiting and stomach pain. This dangerous combination can lead to death. Trying to keep the high feeling leads to greater and greater drug use and this leads to addiction. Addiction can only be helped by stopping use of all chemicals. This is hard but may save your life. If the addiction is  continued, the only possible outcome is loss of self respect and self esteem, violence, death, and eventually prison if the addict is fortunate enough to be caught and able to receive help prior to this last life ending event. OTHER HEALTH RISKS OF COCAINE AND ALL DRUG USE ARE:  The increased possibility of getting AIDS or hepatitis (liver inflammation).   Having a baby born which is addicted to cocaine and must go through  painful withdrawal including shaking, jerking, and crying in pain. Many of the babies die. Other babies go through life with lifelong disabilities and learning problems.  HOW TO STAY DRUG FREE ONCE YOU HAVE QUIT USING:  Develop healthy activities and form friends who do not use drugs.   Stay away from the drug scene.   Tell the pusher or former friend you have other better things to do.   Have ready excuses available about why you cannot use.  For more help or information contact your local physician, clinic, hospital or dial 1-800-cocaine (763)870-4300). Document Released: 04/01/2000 Document Revised: 03/24/2011 Document Reviewed: 11/21/2007 [facility] Patient Information 2012 [place], [place].

## 2011-11-01 NOTE — ED Notes (Signed)
Pt reports here due to seizures. States had seizure yesterday and day before. Reports pain to left eye. A&O x 4.

## 2012-01-03 ENCOUNTER — Emergency Department (HOSPITAL_COMMUNITY)
Admission: EM | Admit: 2012-01-03 | Discharge: 2012-01-03 | Disposition: A | Discharge status: Home / Self Care | Payer: PRIVATE HEALTH INSURANCE | Attending: Emergency Medicine | Admitting: Emergency Medicine

## 2012-01-03 ENCOUNTER — Encounter (HOSPITAL_COMMUNITY): Payer: Self-pay | Admitting: Family Medicine

## 2012-01-03 DIAGNOSIS — X58XXXA Exposure to other specified factors, initial encounter: Secondary | ICD-10-CM | POA: Insufficient documentation

## 2012-01-03 DIAGNOSIS — S0502XA Injury of conjunctiva and corneal abrasion without foreign body, left eye, initial encounter: Secondary | ICD-10-CM

## 2012-01-03 DIAGNOSIS — S058X9A Other injuries of unspecified eye and orbit, initial encounter: Secondary | ICD-10-CM | POA: Insufficient documentation

## 2012-01-03 DIAGNOSIS — G40909 Epilepsy, unspecified, not intractable, without status epilepticus: Secondary | ICD-10-CM | POA: Insufficient documentation

## 2012-01-03 MED ORDER — TETRACAINE HCL 0.5 % OP SOLN
2.0000 [drp] | Freq: Once | OPHTHALMIC | Status: DC
  Filled 2012-01-03: qty 2

## 2012-01-03 MED ORDER — HYDROCODONE-ACETAMINOPHEN 5-325 MG PO TABS: 1.0000 | ORAL_TABLET | ORAL | Status: DC | PRN

## 2012-01-03 MED ORDER — ERYTHROMYCIN 5 MG/GM OP OINT: TOPICAL_OINTMENT | OPHTHALMIC | Status: DC

## 2012-01-03 NOTE — ED Notes (Signed)
Pt states she woke up this morning and having left eye irritation/pain. States she thinks she scratched her eye due to she sleeps with her eyes not all the way closed.

## 2012-01-03 NOTE — Discharge Instructions (Signed)
 Read the information below.  Please follow up with your eye doctor or the eye doctor listed above.  If you develop worsening eye pain, new eye discharge, or change in your vision, see the eye doctor or return to the ER for a recheck.  Use the prescribed medication as directed.  Please discuss all new medications with your pharmacist.  Do not take additional tylenol  while taking the prescribed pain medication to avoid overdose.  You may return to the Emergency Department at any time for worsening condition or any new symptoms that concern you.    Corneal Abrasion The cornea is the clear covering at the front and center of the eye. It is a thin tissue made up of layers. The top layer is the most sensitive layer. A corneal abrasion happens if this layer is scratched or an injury causes it to come off.  HOME CARE  You may be given drops or a medicated cream. Use the medicine as told by your doctor.   A pressure patch may be put over the eye. If this is done, follow your doctor's instructions for when to remove the patch. Do not drive or use machines while the eye patch is on. Judging distances is hard to do with a patch on.   See your doctor for a follow-up exam if you are told to do so.  GET HELP RIGHT AWAY IF:   The pain is getting worse or is very bad.   The eye is very sensitive to light.   Any liquid comes out of the injured eye after treatment.   Your vision suddenly gets worse.   You have a sudden loss of vision or blindness.  MAKE SURE YOU:   Understand these instructions.   Will watch your condition.   Will get help right away if you are not doing well or get worse.  Document Released: 09/21/2007 Document Revised: 03/24/2011 Document Reviewed: 09/21/2007 [facility] Patient Information 2012 [place], [place].

## 2012-01-04 NOTE — ED Provider Notes (Signed)
Medical screening examination/treatment/procedure(s) were performed by non-physician practitioner and as supervising physician I was immediately available for consultation/collaboration.  Sharlette Jansma, MD 01/04/12 1505 

## 2012-01-04 NOTE — ED Provider Notes (Signed)
History     CSN: [id number]  Arrival date & time 01/03/12  1549   First MD Initiated Contact with Patient 01/03/12 1902      Chief Complaint  Patient presents with  . Eye Pain    (Consider location/radiation/quality/duration/timing/severity/associated sxs/prior treatment) HPI Comments: Patient reports she has had pain in her left eye since she woke up this morning that feels like prior corneal abrasion.  States that she has had corneal abrasions in the past because she sleeps with her eyes partially open and gets scratched by her bedding and pillows.  Reports her left eye is red, feels "like it has sand in it."  Denies fevers, drainage, change in vision, pain with EOMs.  No known trauma to the eye.  Pt has remote hx cataract surgery in both eyes.   Patient is a 61 y.o. female presenting with eye pain. The history is provided by the patient.  Eye Pain Pertinent negatives include no chills or fever.    Past Medical History  Diagnosis Date  . Seizures     Past Surgical History  Procedure Date  . Shoulder arthroscopy   . Rotator cuff repair     R side    History reviewed. No pertinent family history.  History  Substance Use Topics  . Smoking status: Not on file  . Smokeless tobacco: Not on file  . Alcohol Use: No    OB History    Grav Para Term Preterm Abortions TAB SAB Ect Mult Living                  Review of Systems  Constitutional: Negative for fever and chills.  Eyes: Positive for photophobia, pain and redness. Negative for discharge and visual disturbance.    Allergies  Haloperidol decanoate  Home Medications   Current Outpatient Rx  Name Route Sig Dispense Refill  . AMITRIPTYLINE HCL 150 MG PO TABS Oral Take 150 mg by mouth at bedtime.    Marland Kitchen VITAMIN D 1000 UNITS PO TABS Oral Take 2,000 Units by mouth See admin instructions.     . CLONAZEPAM 0.5 MG PO TABS Oral Take 0.5 mg by mouth 2 (two) times daily as needed. For anxiety    . HYDROXYZINE HCL 25 MG  PO TABS Oral Take 25 mg by mouth 3 (three) times daily as needed. itching    . LAMOTRIGINE 100 MG PO TABS Oral Take 100 mg by mouth 2 (two) times daily. TAKES ONE IN THE MORNING AND TWO AND NIGHT    . SIMVASTATIN 20 MG PO TABS Oral Take 20 mg by mouth every evening.    . ERYTHROMYCIN 5 MG/GM OP OINT  Place a 1/2 inch ribbon of ointment into the lower eyelid. Left eye, four times daily x 5 days. 1 g 0  . HYDROCODONE-ACETAMINOPHEN 5-325 MG PO TABS Oral Take 1 tablet by mouth every 4 (four) hours as needed for pain. 8 tablet 0    BP 101/69  Pulse 88  Temp 97.4 F (36.3 C) (Oral)  Resp 18  SpO2 98%  Physical Exam  Nursing note and vitals reviewed. Constitutional: She appears well-developed and well-nourished. No distress.  HENT:  Head: Normocephalic and atraumatic.  Eyes: EOM and lids are normal. Pupils are equal, round, and reactive to light. No foreign bodies found. Right eye exhibits no discharge. Left eye exhibits no discharge. No foreign body present in the left eye. Right conjunctiva is not injected. Left conjunctiva is injected. No scleral icterus.  Slit  lamp exam:      The left eye shows corneal abrasion. The left eye shows no corneal flare, no corneal ulcer, no foreign body, no hyphema and no hypopyon.    Neck: Neck supple.  Pulmonary/Chest: Effort normal.  Neurological: She is alert.  Skin: She is not diaphoretic.  Visual acuity 20/50 in left eye, 20/50 in right eye  ED Course  Procedures (including critical care time)  Labs Reviewed - No data to display No results found.   1. Corneal abrasion, left     MDM  Pt with mild corneal abrasion of left eye, hx same states from bed linens.  Pt does not wear contact lenses.  No FB found, no e/o ulcer. No cellulitis.  Pt d/c home with erythromycin ointment and vicodin, ophthlamology follow up.  Discussed findings and care plan with patient.  Pt given return precautions.  Pt verbalizes understanding and agrees with plan.            [place], [place] 01/04/12 [phone]

## 2012-01-15 ENCOUNTER — Encounter (HOSPITAL_COMMUNITY): Payer: Self-pay

## 2012-01-15 ENCOUNTER — Emergency Department (HOSPITAL_COMMUNITY)
Admission: EM | Admit: 2012-01-15 | Discharge: 2012-01-15 | Disposition: A | Discharge status: Home / Self Care | Payer: PRIVATE HEALTH INSURANCE | Attending: Emergency Medicine | Admitting: Emergency Medicine

## 2012-01-15 DIAGNOSIS — J069 Acute upper respiratory infection, unspecified: Secondary | ICD-10-CM | POA: Insufficient documentation

## 2012-01-15 DIAGNOSIS — F17200 Nicotine dependence, unspecified, uncomplicated: Secondary | ICD-10-CM | POA: Insufficient documentation

## 2012-01-15 DIAGNOSIS — Z888 Allergy status to other drugs, medicaments and biological substances status: Secondary | ICD-10-CM | POA: Insufficient documentation

## 2012-01-15 HISTORY — DX: Carrier or suspected carrier of methicillin resistant Staphylococcus aureus: Z22.322

## 2012-01-15 HISTORY — DX: Gastric ulcer, unspecified as acute or chronic, without hemorrhage or perforation: K25.9

## 2012-01-15 MED ORDER — AZITHROMYCIN 250 MG PO TABS: 250.0000 mg | ORAL_TABLET | Freq: Every day | ORAL | Status: DC

## 2012-01-15 NOTE — ED Notes (Signed)
Symptoms x 1 month, nasal congestion, productive cough with green mucus, and general feeling of sickness. No N/V, has good appetite.

## 2012-01-15 NOTE — ED Provider Notes (Signed)
History     CSN: [id number]  Arrival date & time 01/15/12  1256   First MD Initiated Contact with Patient 01/15/12 1307      Chief Complaint  Patient presents with  . Nasal Congestion  . Cough    (Consider location/radiation/quality/duration/timing/severity/associated sxs/prior treatment) Patient is a 61 y.o. female presenting with cough. The history is provided by the patient. No language interpreter was used.  Cough This is a chronic problem. The current episode started more than 1 week ago. The problem occurs constantly. The problem has been gradually worsening. The cough is productive of sputum. Associated symptoms include headaches and rhinorrhea. Pertinent negatives include no chest pain, no chills, no ear congestion, no ear pain, no sore throat, no shortness of breath and no wheezing. The treatment provided mild relief. She is a smoker. Her past medical history does not include bronchitis, pneumonia or COPD.   61 year old female with complaint of her respiratory symptoms times more than 3 weeks. The patient states that she does have a productive cough but no fever. He denies shortness of breath, chest pain.   Past Medical History  Diagnosis Date  . Seizures   . Gastric ulcer   . MRSA (methicillin resistant staph aureus) culture positive     Past Surgical History  Procedure Date  . Shoulder arthroscopy   . Rotator cuff repair     R side    No family history on file.  History  Substance Use Topics  . Smoking status: Current Every Day Smoker  . Smokeless tobacco: Not on file  . Alcohol Use: No    OB History    Grav Para Term Preterm Abortions TAB SAB Ect Mult Living                  Review of Systems  Constitutional: Negative.  Negative for chills.  HENT: Positive for congestion, rhinorrhea, postnasal drip and sinus pressure. Negative for ear pain, sore throat, neck pain and neck stiffness.   Eyes: Negative.   Respiratory: Positive for cough. Negative for  shortness of breath and wheezing.   Cardiovascular: Negative.  Negative for chest pain.  Gastrointestinal: Negative.  Negative for nausea and vomiting.  Musculoskeletal: Negative for gait problem.  Neurological: Positive for headaches.  Psychiatric/Behavioral: Negative.   All other systems reviewed and are negative.    Allergies  Haloperidol decanoate  Home Medications   Current Outpatient Rx  Name Route Sig Dispense Refill  . AMITRIPTYLINE HCL 150 MG PO TABS Oral Take 150 mg by mouth at bedtime.    Marland Kitchen VITAMIN D 1000 UNITS PO TABS Oral Take 2,000 Units by mouth daily.     Marland Kitchen CLONAZEPAM 0.5 MG PO TABS Oral Take 0.5 mg by mouth 2 (two) times daily as needed. For anxiety    . HYDROXYZINE HCL 25 MG PO TABS Oral Take 25 mg by mouth 3 (three) times daily as needed. itching    . LAMOTRIGINE 100 MG PO TABS Oral Take 100-200 mg by mouth 2 (two) times daily. 1 tab in am, 2 tab in pm    . ADULT MULTIVITAMIN W/MINERALS CH Oral Take 1 tablet by mouth daily.    Marland Kitchen SIMVASTATIN 20 MG PO TABS Oral Take 20 mg by mouth every evening.    Marland Kitchen TRAMADOL HCL 50 MG PO TABS Oral Take 50 mg by mouth every 6 (six) hours as needed. For pain.    Marland Kitchen AZITHROMYCIN 250 MG PO TABS Oral Take 1 tablet (250 mg total)  by mouth daily. 6 tablet 0    BP 126/80  Pulse 78  Temp 98.1 F (36.7 C) (Oral)  Resp 16  SpO2 100%  Physical Exam  Nursing note and vitals reviewed. Constitutional: She is oriented to person, place, and time. She appears well-developed and well-nourished.  HENT:  Head: Normocephalic and atraumatic.  Right Ear: Tympanic membrane normal.  Left Ear: Tympanic membrane normal.  Nose: Rhinorrhea present. No sinus tenderness.  Mouth/Throat: Uvula is midline, oropharynx is clear and moist and mucous membranes are normal.  Eyes: Conjunctivae normal and EOM are normal. Pupils are equal, round, and reactive to light.  Neck: Normal range of motion. Neck supple.  Cardiovascular: Normal rate.   Pulmonary/Chest:  Effort normal and breath sounds normal. No respiratory distress. She has no wheezes. She has no rales.  Abdominal: Soft.  Musculoskeletal: Normal range of motion. She exhibits no edema and no tenderness.  Neurological: She is alert and oriented to person, place, and time. She has normal reflexes.  Skin: Skin is warm and dry.  Psychiatric: She has a normal mood and affect.    ED Course  Procedures (including critical care time)  Labs Reviewed - No data to display No results found.   1. URI (upper respiratory infection)       MDM  61 year old smoker with complaint of upper respiratory symptoms and productive cough for more than 2 weeks. Patient was put on a Z-Pak and instructed to take over-the-counter medications such as Benadryl Mucinex. She will follow with PCP of her choice. She will return to the ER if she has uncontrolled fever nausea vomiting or any other concerns.       Remi Haggard, NP 01/15/12 1614

## 2012-01-15 NOTE — Discharge Instructions (Signed)
 Ms Nowakowski we will put you on a Z-Pak since you've had this infection for over 2 weeks. You can take Benadryl  at night to help you sleep and help head congestion. Also take ibuprofen  600 mg every 6 hours with foodnfor 24 hours since and obstain from smoking. Return to the ER for uncontrolled fever, nausea vomiting, or shortness of breath.

## 2012-01-16 NOTE — ED Provider Notes (Signed)
Medical screening examination/treatment/procedure(s) were performed by non-physician practitioner and as supervising physician I was immediately available for consultation/collaboration.  Cheri Guppy, MD 01/16/12 [id number]

## 2012-01-20 DIAGNOSIS — F419 Anxiety disorder, unspecified: Secondary | ICD-10-CM | POA: Insufficient documentation

## 2012-01-20 DIAGNOSIS — T8859XA Other complications of anesthesia, initial encounter: Secondary | ICD-10-CM

## 2012-01-20 DIAGNOSIS — Z9189 Other specified personal risk factors, not elsewhere classified: Secondary | ICD-10-CM | POA: Insufficient documentation

## 2012-01-20 HISTORY — DX: Other complications of anesthesia, initial encounter: T88.59XA

## 2012-01-28 ENCOUNTER — Encounter (HOSPITAL_COMMUNITY): Payer: Self-pay | Admitting: *Deleted

## 2012-01-28 ENCOUNTER — Emergency Department (HOSPITAL_COMMUNITY)
Admission: EM | Admit: 2012-01-28 | Discharge: 2012-01-28 | Disposition: A | Discharge status: Home / Self Care | Payer: PRIVATE HEALTH INSURANCE | Attending: Emergency Medicine | Admitting: Emergency Medicine

## 2012-01-28 DIAGNOSIS — Z8614 Personal history of Methicillin resistant Staphylococcus aureus infection: Secondary | ICD-10-CM | POA: Insufficient documentation

## 2012-01-28 DIAGNOSIS — Z4889 Encounter for other specified surgical aftercare: Secondary | ICD-10-CM

## 2012-01-28 DIAGNOSIS — Z888 Allergy status to other drugs, medicaments and biological substances status: Secondary | ICD-10-CM | POA: Insufficient documentation

## 2012-01-28 DIAGNOSIS — F17200 Nicotine dependence, unspecified, uncomplicated: Secondary | ICD-10-CM | POA: Insufficient documentation

## 2012-01-28 DIAGNOSIS — Z09 Encounter for follow-up examination after completed treatment for conditions other than malignant neoplasm: Secondary | ICD-10-CM | POA: Insufficient documentation

## 2012-01-28 MED ORDER — TRAMADOL HCL 50 MG PO TABS: 50.0000 mg | ORAL_TABLET | Freq: Four times a day (QID) | ORAL | Status: DC | PRN

## 2012-01-28 MED ORDER — SULFAMETHOXAZOLE-TRIMETHOPRIM 800-160 MG PO TABS: 1.0000 | ORAL_TABLET | Freq: Two times a day (BID) | ORAL | Status: AC

## 2012-01-28 MED ORDER — MUPIROCIN CALCIUM 2 % EX CREA: TOPICAL_CREAM | Freq: Three times a day (TID) | CUTANEOUS | Status: DC

## 2012-01-28 MED ORDER — HYDROCODONE-ACETAMINOPHEN 5-325 MG PO TABS
2.0000 | ORAL_TABLET | Freq: Once | ORAL | Status: AC
  Administered 2012-01-28: 2 via ORAL
  Filled 2012-01-28: qty 2

## 2012-01-28 NOTE — ED Notes (Signed)
Patient given discharge instructions, information, prescriptions, and diet order. Patient states that they adequately understand discharge information given and to return to ED if symptoms return or worsen.     

## 2012-01-28 NOTE — ED Provider Notes (Signed)
Medical screening examination/treatment/procedure(s) were performed by non-physician practitioner and as supervising physician I was immediately available for consultation/collaboration.  Jones Skene, M.D.     Jones Skene, MD 01/28/12 2308

## 2012-01-28 NOTE — ED Notes (Signed)
Pt reports Dr. Conley Rolls placed sutures in her right shoulder in Jan for a pinched nerve which came loose a week after the procedure.  Pt is concerned about the same thing for her incision due to pressure placed when placing and removing clothing.

## 2012-01-28 NOTE — ED Notes (Signed)
Patient is alert and oriented x3.  She is complaining of lower left flank pain post surgery For stent removal.  She states her pain is rated at 5 of 10.  She denies any nausea or vomiting.

## 2012-01-28 NOTE — Discharge Instructions (Signed)
 Apply bactroban  topically to the wound. Keep wound covered. Bactrim  to prevent infection. Ultram  for pain. Continue oxycodone , if too strong cut in half. Follow up with your doctor on Tuesday as scheduled.   Incision Care An incision is when a surgeon cuts into your body tissues. After surgery, the incision needs to be cared for properly to prevent infection.  HOME CARE INSTRUCTIONS   Take all medicine as directed by your caregiver. Only take over-the-counter or prescription medicines for pain, discomfort, or fever as directed by your caregiver.  Do not remove your bandage (dressing) or get your incision wet until your surgeon gives you permission. In the event that your dressing becomes wet, dirty, or starts to smell, change the dressing and call your surgeon for instructions as soon as possible.  Take showers. Do not take tub baths, swim, or do anything that may soak the wound until it is healed.  Resume your normal diet and activities as directed or allowed.  Avoid lifting any weight until you are instructed otherwise.  Use anti-itch antihistamine medicine as directed by your caregiver. The wound may itch when it is healing. Do not pick or scratch at the wound.  Follow up with your caregiver for stitch (suture) or staple removal as directed.  Drink enough fluids to keep your urine clear or pale yellow. SEEK MEDICAL CARE IF:   You have redness, swelling, or increasing pain in the wound that is not controlled with medicine.  You have drainage, blood, or pus coming from the wound that lasts longer than 1 day.  You develop muscle aches, chills, or a general ill feeling.  You notice a bad smell coming from the wound or dressing.  Your wound edges separate after the sutures, staples, or skin adhesive strips have been removed.  You develop persistent nausea or vomiting. SEEK IMMEDIATE MEDICAL CARE IF:   You have a fever.  You develop a rash.  You develop dizzy episodes or faint  while standing.  You have difficulty breathing.  You develop any reaction or side effects to medicine given. MAKE SURE YOU:   Understand these instructions.  Will watch your condition.  Will get help right away if you are not doing well or get worse. Document Released: 10/22/2004 Document Revised: 06/27/2011 Document Reviewed: 08/08/2010 [facility] Patient Information 2013 [place], [place].

## 2012-01-28 NOTE — ED Notes (Signed)
Pt has incision in posterior left lower back for Interstem performed last Friday. Area of incision is reddened, but has no drainage or foul odor. Pt reports left flank pain that started last night. Pt denies hx of kidney stones. Denies blood in urine.

## 2012-01-28 NOTE — ED Provider Notes (Signed)
History     CSN: [id number]  Arrival date & time 01/28/12  2057   First MD Initiated Contact with Patient 01/28/12 2127      Chief Complaint  Patient presents with  . Wound Check    lower left flank   . Flank Pain    (Consider location/radiation/quality/duration/timing/severity/associated sxs/prior treatment) HPI Comments: [name] is a 61 y.o. Female who presents for recheck of a surgical incision. States 1 week ago, had a "?stent/stimulator"? Removed at [facility]. States it was a device that would help her go to the bathroom. States she now has a surgical incision to the back. States area is irritated, red, since yesterday. States worrried that it may be infected. 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 Pt denies fever, chills, nausea, vomiting, malaise.    Past Medical History  Diagnosis Date  . Seizures   . Gastric ulcer   . MRSA (methicillin resistant staph aureus) culture positive     Past Surgical History  Procedure Date  . Shoulder arthroscopy   . Rotator cuff repair     R side    History reviewed. No pertinent family history.  History  Substance Use Topics  . Smoking status: Current Every Day Smoker  . Smokeless tobacco: Not on file  . Alcohol Use: No    OB History    Grav Para Term Preterm Abortions TAB SAB Ect Mult Living                  Review of Systems  Skin: Positive for wound.  All other systems reviewed and are negative.    Allergies  Haloperidol decanoate  Home Medications   Current Outpatient Rx  Name Route Sig Dispense Refill  . AMITRIPTYLINE HCL 150 MG PO TABS Oral Take 150 mg by mouth at bedtime.    Marland Kitchen VITAMIN D 1000 UNITS PO TABS Oral Take 2,000 Units by mouth daily.     Marland Kitchen CLONAZEPAM 0.5 MG PO TABS Oral Take 0.5 mg by mouth 2 (two) times daily as needed. For anxiety    . HYDROXYZINE HCL 25 MG PO TABS Oral Take 25 mg by mouth 3 (three) times daily as needed. itching    . IBUPROFEN  200 MG PO TABS Oral Take 400 mg by mouth every 6 (six) hours as needed. Mild pain    . LAMOTRIGINE 100 MG PO TABS Oral Take 100-200 mg by mouth 2 (two) times daily. 1 tab in am, 2 tab in pm    . ADULT MULTIVITAMIN W/MINERALS CH Oral Take 1 tablet by mouth daily.    Marland Kitchen SIMVASTATIN 20 MG PO TABS Oral Take 20 mg by mouth every evening.    Marland Kitchen TRAMADOL HCL 50 MG PO TABS Oral Take 50 mg by mouth every 6 (six) hours as needed. For pain.      BP 106/78  Pulse 81  Temp 98.1 F (36.7 C) (Oral)  Resp 18  SpO2 96%  Physical Exam  Nursing note and vitals reviewed. Constitutional: She is oriented to person, place, and time. She appears well-developed and well-nourished. No distress.       Pt appears intoxicated/under influence, with poor coordination  Eyes: Conjunctivae normal are normal.  Neck: Neck supple.  Cardiovascular: Normal rate, regular rhythm and normal heart sounds.   Pulmonary/Chest: Effort normal and breath sounds normal. No respiratory distress. She has no wheezes. She has no rales.  Abdominal: Soft. Bowel sounds are normal. She  exhibits no distension. There is no tenderness. There is no rebound.  Neurological: She is alert and oriented to person, place, and time.  Skin: Skin is warm.       4cm post surgical incision to the left lower back. Edges approximated, with sub Q stitches. No drainage, swelling noted. There is surrounding erythema and irritation with excoriation marks. Pt is scratching the area  As she is speaking with me    ED Course  Procedures (including critical care time)  Pt with normal appearing post surgical wound to the left lower back with exception of some irritation and excoriations around the area. Wound covered with sterile dressing, bacitracin. Will start on bactrim apophylactically and topical Bactroban given pt has hx of MRSA infection in the past requiring more surgeries. Pt otherwise non toxic. She is asking for pain medications. Explained not comfortable  providing her with those given she still has percocet and ultram. Pt states she ran out of ultram, will give enough till Tuesday when she has a follow up with her doctor at [facility].   1. Encounter for postoperative wound check       MDM         Lottie Mussel, PA 01/28/12 2252

## 2012-02-06 ENCOUNTER — Emergency Department (HOSPITAL_COMMUNITY): Payer: PRIVATE HEALTH INSURANCE

## 2012-02-06 ENCOUNTER — Emergency Department (HOSPITAL_COMMUNITY)
Admission: EM | Admit: 2012-02-06 | Discharge: 2012-02-06 | Disposition: A | Discharge status: Home / Self Care | Payer: PRIVATE HEALTH INSURANCE | Attending: Emergency Medicine | Admitting: Emergency Medicine

## 2012-02-06 ENCOUNTER — Encounter (HOSPITAL_COMMUNITY): Payer: Self-pay | Admitting: *Deleted

## 2012-02-06 DIAGNOSIS — S93402A Sprain of unspecified ligament of left ankle, initial encounter: Secondary | ICD-10-CM

## 2012-02-06 DIAGNOSIS — F17200 Nicotine dependence, unspecified, uncomplicated: Secondary | ICD-10-CM | POA: Insufficient documentation

## 2012-02-06 DIAGNOSIS — Y9301 Activity, walking, marching and hiking: Secondary | ICD-10-CM | POA: Insufficient documentation

## 2012-02-06 DIAGNOSIS — Y929 Unspecified place or not applicable: Secondary | ICD-10-CM | POA: Insufficient documentation

## 2012-02-06 DIAGNOSIS — S93409A Sprain of unspecified ligament of unspecified ankle, initial encounter: Secondary | ICD-10-CM | POA: Insufficient documentation

## 2012-02-06 DIAGNOSIS — W1789XA Other fall from one level to another, initial encounter: Secondary | ICD-10-CM | POA: Insufficient documentation

## 2012-02-06 DIAGNOSIS — Z79899 Other long term (current) drug therapy: Secondary | ICD-10-CM | POA: Insufficient documentation

## 2012-02-06 DIAGNOSIS — Z8614 Personal history of Methicillin resistant Staphylococcus aureus infection: Secondary | ICD-10-CM | POA: Insufficient documentation

## 2012-02-06 MED ORDER — OXYCODONE-ACETAMINOPHEN 5-325 MG PO TABS
1.0000 | ORAL_TABLET | Freq: Once | ORAL | Status: AC
  Administered 2012-02-06: 1 via ORAL
  Filled 2012-02-06: qty 1

## 2012-02-06 MED ORDER — HYDROCODONE-ACETAMINOPHEN 5-500 MG PO TABS: 1.0000 | ORAL_TABLET | Freq: Four times a day (QID) | ORAL | Status: DC | PRN

## 2012-02-06 NOTE — ED Provider Notes (Signed)
Medical screening examination/treatment/procedure(s) were performed by non-physician practitioner and as supervising physician I was immediately available for consultation/collaboration.  Ryla Cauthon, MD 02/06/12 2204 

## 2012-02-06 NOTE — Discharge Instructions (Signed)
Ankle Sprain  An ankle sprain is an injury to the strong, fibrous tissues (ligaments) that hold the bones of your ankle joint together.   CAUSES  An ankle sprain is usually caused by a fall or by twisting your ankle. Ankle sprains most commonly occur when you step on the outer edge of your foot, and your ankle turns inward. People who participate in sports are more prone to these types of injuries.   SYMPTOMS    Pain in your ankle. The pain may be present at rest or only when you are trying to stand or walk.   Swelling.   Bruising. Bruising may develop immediately or within 1 to 2 days after your injury.   Difficulty standing or walking, particularly when turning corners or changing directions.  DIAGNOSIS   Your caregiver will ask you details about your injury and perform a physical exam of your ankle to determine if you have an ankle sprain. During the physical exam, your caregiver will press on and apply pressure to specific areas of your foot and ankle. Your caregiver will try to move your ankle in certain ways. An X-ray exam may be done to be sure a bone was not broken or a ligament did not separate from one of the bones in your ankle (avulsion fracture).   TREATMENT   Certain types of braces can help stabilize your ankle. Your caregiver can make a recommendation for this. Your caregiver may recommend the use of medicine for pain. If your sprain is severe, your caregiver may refer you to a surgeon who helps to restore function to parts of your skeletal system (orthopedist) or a physical therapist.  HOME CARE INSTRUCTIONS    Apply ice to your injury for 1 to 2 days or as directed by your caregiver. Applying ice helps to reduce inflammation and pain.   Put ice in a plastic bag.   Place a towel between your skin and the bag.   Leave the ice on for 15 to 20 minutes at a time, every 2 hours while you are awake.   Only take over-the-counter or prescription medicines for pain, discomfort, or fever as directed  by your caregiver.   Keep your injured leg elevated, when possible, to lessen swelling.   If your caregiver recommends crutches, use them as instructed. Gradually put weight on the affected ankle. Continue to use crutches or a cane until you can walk without feeling pain in your ankle.   If you have a plaster splint, wear the splint as directed by your caregiver. Do not rest it on anything harder than a pillow for the first 24 hours. Do not put weight on it. Do not get it wet. You may take it off to take a shower or bath.   You may have been given an elastic bandage to wear around your ankle to provide support. If the elastic bandage is too tight (you have numbness or tingling in your foot or your foot becomes cold and blue), adjust the bandage to make it comfortable.   If you have an air splint, you may blow more air into it or let air out to make it more comfortable. You may take your splint off at night and before taking a shower or bath.   Wiggle your toes in the splint several times per day to decrease swelling.  SEEK MEDICAL CARE IF:    You have an increase in bruising, swelling, or pain.   Your toes feel extremely cold   or you lose feeling in your foot.   Your pain is not relieved with medicine.  SEEK IMMEDIATE MEDICAL CARE IF:   Your toes are numb or blue.   You have severe pain.  MAKE SURE YOU:    Understand these instructions.   Will watch your condition.   Will get help right away if you are not doing well or get worse.  Document Released: 04/04/2005 Document Revised: 06/27/2011 Document Reviewed: 04/16/2011  [employer] Patient Information 2013 [employer].

## 2012-02-06 NOTE — ED Notes (Signed)
Pt states that she tripped and twisted left ankle 4 days ago. States that she saw Dr. Bruna Potter and he prescribed pain medicine. States that she is out of pain medicine and never received an xray of her ankle. States pain is 7/10. Pt able to move ankle with some difficulty. Ankle is swollen and painful to touch. Good pedal pulse.

## 2012-02-06 NOTE — ED Provider Notes (Signed)
History     CSN: [id number]  Arrival date & time 02/06/12  1349   First MD Initiated Contact with Patient 02/06/12 1356      Chief Complaint  Patient presents with  . Ankle Pain    (Consider location/radiation/quality/duration/timing/severity/associated sxs/prior treatment) HPI  61 year old female presents complaining of left ankle injury. Patient reports she was walking on some uneven surface 4 days ago and twisted her ankle inward.  Pt fell to the ground.  Denies hitting head or LOC.  Was having difficulty standing up but was able to ambulate afterward.  C/o acute onset of sharp throbbing pain to L ankle and L foot with moderate severity, worse with walking and improves with rest.  Was seen by her PCP and was given pain medication which has helped but she ran out of pain meds.  Sts she did not received an xray and is afraid she may have broken her bone.  Pt request for xray.  Pt denies numbness or weakness.  Denies knee or hip pain.  Has tried RICE therapy which has helped.    Past Medical History  Diagnosis Date  . Seizures   . Gastric ulcer   . MRSA (methicillin resistant staph aureus) culture positive     Past Surgical History  Procedure Date  . Shoulder arthroscopy   . Rotator cuff repair     R side    No family history on file.  History  Substance Use Topics  . Smoking status: Current Every Day Smoker  . Smokeless tobacco: Not on file  . Alcohol Use: No    OB History    Grav Para Term Preterm Abortions TAB SAB Ect Mult Living                  Review of Systems  Constitutional: Negative for fever.  Musculoskeletal: Positive for joint swelling. Negative for back pain.  Skin: Negative for rash and wound.  Neurological: Negative for numbness.    Allergies  Haloperidol decanoate  Home Medications   Current Outpatient Rx  Name Route Sig Dispense Refill  . AMITRIPTYLINE HCL 150 MG PO TABS Oral Take 150 mg by mouth at bedtime.    Marland Kitchen VITAMIN D 1000 UNITS  PO TABS Oral Take 2,000 Units by mouth daily.     Marland Kitchen CLONAZEPAM 0.5 MG PO TABS Oral Take 0.5 mg by mouth 2 (two) times daily as needed. For anxiety    . HYDROXYZINE HCL 25 MG PO TABS Oral Take 25 mg by mouth 3 (three) times daily as needed. itching    . IBUPROFEN 200 MG PO TABS Oral Take 400 mg by mouth every 6 (six) hours as needed. Mild pain    . LAMOTRIGINE 100 MG PO TABS Oral Take 100-200 mg by mouth 2 (two) times daily. 1 tab in am, 2 tab in pm    . ADULT MULTIVITAMIN W/MINERALS CH Oral Take 1 tablet by mouth daily.    Marland Kitchen MUPIROCIN CALCIUM 2 % EX CREA Topical Apply topically 3 (three) times daily. 15 g 0  . SIMVASTATIN 20 MG PO TABS Oral Take 20 mg by mouth every evening.    Marland Kitchen TRAMADOL HCL 50 MG PO TABS Oral Take 50 mg by mouth every 6 (six) hours as needed. For pain.    Marland Kitchen TRAMADOL HCL 50 MG PO TABS Oral Take 1 tablet (50 mg total) by mouth every 6 (six) hours as needed for pain. 15 tablet 0    BP 141/69  Pulse  90  Temp 98.8 F (37.1 C) (Oral)  Resp 16  Ht 5\' 6"  (1.676 m)  Wt 147 lb (66.679 kg)  BMI 23.73 kg/m2  SpO2 97%  Physical Exam  Nursing note and vitals reviewed. Constitutional: She is oriented to person, place, and time. She appears well-developed and well-nourished. No distress.  HENT:  Head: Atraumatic.  Eyes: Conjunctivae normal are normal.  Neck: Neck supple.  Musculoskeletal:       Left hip: Normal.       Left knee: Normal.       Left ankle: She exhibits decreased range of motion and swelling. She exhibits no ecchymosis, no deformity, no laceration and normal pulse. tenderness. Lateral malleolus and head of 5th metatarsal tenderness found. No medial malleolus, no AITFL, no CF ligament, no posterior TFL and no proximal fibula tenderness found. Achilles tendon normal.       Left foot: She exhibits decreased range of motion, tenderness and bony tenderness. She exhibits no swelling, normal capillary refill, no crepitus, no deformity and no laceration.  Neurological: She  is alert and oriented to person, place, and time.  Skin: Skin is warm. No rash noted.  Psychiatric: She has a normal mood and affect.    ED Course  Procedures (including critical care time)  Dg Ankle Complete Left  02/06/2012  *RADIOLOGY REPORT*  Clinical Data: Twisting injury with lateral pain and swelling.  LEFT ANKLE COMPLETE - 3+ VIEW  Comparison: 02/13/2006.  Findings: There is mild soft tissue swelling over the lateral malleolus.  Remote fracture of the distal fibula is seen without evidence of an acute superimposed fracture.  IMPRESSION: Lateral soft tissue swelling without acute fracture.   Original Report Authenticated By: Reyes Ivan, M.D.    Dg Foot Complete Left  02/06/2012  *RADIOLOGY REPORT*  Clinical Data: Fall with pain.  LEFT FOOT - COMPLETE 3+ VIEW  Comparison: None.  Findings: No acute osseous or joint abnormality.  There is some flattening of the heads of the third, fourth and fifth proximal phalanges.  Tiny calcaneal spur.  IMPRESSION:  1.  No acute osseous or joint abnormality. 2.  Question post-traumatic versus chronic inflammatory arthritic changes involving the heads of the third, fourth and fifth proximal phalanges.   Original Report Authenticated By: Reyes Ivan, M.D.    1. L ankle sprain   MDM  L ankle and L foot injury.  Xray ordered.    3:01 PM Xray shows no acute fx or dislocation.  Will offer ASO for support.  Crutches offered, pt declined.  Pain medication prescribed.  Ortho referral given.  Continue RICE therapy.  Pt voice understanding and agrees with plan.  Pt able to ambulate.  BP 141/69  Pulse 90  Temp 98.8 F (37.1 C) (Oral)  Resp 16  Ht 5\' 6"  (1.676 m)  Wt 147 lb (66.679 kg)  BMI 23.73 kg/m2  SpO2 97%   I have reviewed nursing notes and vital signs. I personally reviewed the imaging tests through PACS system  I reviewed available ER/hospitalization records thought the EMR        Fayrene Helper, [place] 02/06/12 1503

## 2012-02-28 ENCOUNTER — Encounter (HOSPITAL_COMMUNITY): Payer: Self-pay | Admitting: *Deleted

## 2012-02-28 ENCOUNTER — Emergency Department (HOSPITAL_COMMUNITY)
Admission: EM | Admit: 2012-02-28 | Discharge: 2012-02-28 | Disposition: A | Discharge status: Home / Self Care | Payer: PRIVATE HEALTH INSURANCE | Attending: Emergency Medicine | Admitting: Emergency Medicine

## 2012-02-28 DIAGNOSIS — G8929 Other chronic pain: Secondary | ICD-10-CM

## 2012-02-28 DIAGNOSIS — N949 Unspecified condition associated with female genital organs and menstrual cycle: Secondary | ICD-10-CM | POA: Insufficient documentation

## 2012-02-28 DIAGNOSIS — F17200 Nicotine dependence, unspecified, uncomplicated: Secondary | ICD-10-CM | POA: Insufficient documentation

## 2012-02-28 DIAGNOSIS — Z8614 Personal history of Methicillin resistant Staphylococcus aureus infection: Secondary | ICD-10-CM | POA: Insufficient documentation

## 2012-02-28 DIAGNOSIS — Z8719 Personal history of other diseases of the digestive system: Secondary | ICD-10-CM | POA: Insufficient documentation

## 2012-02-28 DIAGNOSIS — G40909 Epilepsy, unspecified, not intractable, without status epilepticus: Secondary | ICD-10-CM | POA: Insufficient documentation

## 2012-02-28 DIAGNOSIS — Z79899 Other long term (current) drug therapy: Secondary | ICD-10-CM | POA: Insufficient documentation

## 2012-02-28 DIAGNOSIS — F1410 Cocaine abuse, uncomplicated: Secondary | ICD-10-CM

## 2012-02-28 HISTORY — DX: Interstitial cystitis (chronic) without hematuria: N30.10

## 2012-02-28 LAB — RAPID URINE DRUG SCREEN, HOSP PERFORMED
Amphetamines: NOT DETECTED
Barbiturates: NOT DETECTED
Benzodiazepines: NOT DETECTED
Cocaine: POSITIVE — AB
Opiates: NOT DETECTED
Tetrahydrocannabinol: NOT DETECTED

## 2012-02-28 LAB — URINALYSIS, ROUTINE W REFLEX MICROSCOPIC
Bilirubin Urine: NEGATIVE
Glucose, UA: NEGATIVE mg/dL
Hgb urine dipstick: NEGATIVE
Ketones, ur: NEGATIVE mg/dL
Leukocytes, UA: NEGATIVE
Nitrite: NEGATIVE
Protein, ur: NEGATIVE mg/dL
Specific Gravity, Urine: 1.006 (ref 1.005–1.030)
Urobilinogen, UA: 0.2 mg/dL (ref 0.0–1.0)
pH: 7 (ref 5.0–8.0)

## 2012-02-28 NOTE — ED Notes (Signed)
Accompanied Dr. Read Drivers in room to discuss urine drug screen. Positive for cocaine, informed pt she would not be prescribed additional narcotics d/t abuse of other drugs. Pt told to get dressed she would be discharged.

## 2012-02-28 NOTE — ED Notes (Signed)
Pt c/o chronic pain; worse x 2 hours; pelvic pain; also c/o left ankle sprain that needs to be checked

## 2012-02-28 NOTE — ED Notes (Signed)
Left dept w/o receiving discharge instructions.

## 2012-02-28 NOTE — Discharge Instructions (Signed)

## 2012-02-28 NOTE — ED Provider Notes (Signed)
History     CSN: [id number]  Arrival date & time 02/28/12  3086   First MD Initiated Contact with Patient 02/28/12 0510      Chief Complaint  Patient presents with  . Abdominal Pain    (Consider location/radiation/quality/duration/timing/severity/associated sxs/prior treatment) HPI This is a 61 year old female who states she has a history of interstitial cystitis which she is treated with amitriptyline and hydroxyzine. She states she had been on hydrocodone but her Urologist has discontinued writing this for her, having referred her to pain management. She states she also had a spinal stimulator removed a month ago. She states she has yet to meet with pain management. She states the amitriptyline and hydroxyzine do not adequately treat her pain and make her constipated. She is here because her chronic interstitial cystitis pain worsened this morning.  The symptoms are moderate. She states she is having difficulty voiding and voiding small amounts. She denies fever or chills. She denies burning with urination. She states she's never had a urinary tract infection. She states she is not currently taking hydrocodone. She states she sprained her left ankle about a month ago and that continues to be swollen despite use of an ASO; she states x-ray showed no fracture. She admits she has not been using the ASO regularly.  Past Medical History  Diagnosis Date  . Seizures   . Gastric ulcer   . MRSA (methicillin resistant staph aureus) culture positive     Past Surgical History  Procedure Date  . Shoulder arthroscopy   . Rotator cuff repair     R side    No family history on file.  History  Substance Use Topics  . Smoking status: Current Every Day Smoker  . Smokeless tobacco: Not on file  . Alcohol Use: No    OB History    Grav Para Term Preterm Abortions TAB SAB Ect Mult Living                  Review of Systems  All other systems reviewed and are negative.    Allergies    Haloperidol decanoate  Home Medications   Current Outpatient Rx  Name  Route  Sig  Dispense  Refill  . AMITRIPTYLINE HCL 150 MG PO TABS   Oral   Take 150 mg by mouth at bedtime.         Marland Kitchen VITAMIN D 1000 UNITS PO TABS   Oral   Take 2,000 Units by mouth daily.          Marland Kitchen CLONAZEPAM 0.5 MG PO TABS   Oral   Take 0.5 mg by mouth 2 (two) times daily as needed. For anxiety         . HYDROCODONE-ACETAMINOPHEN 5-500 MG PO TABS   Oral   Take 1-2 tablets by mouth every 6 (six) hours as needed for pain.   15 tablet   0   . HYDROXYZINE HCL 25 MG PO TABS   Oral   Take 25 mg by mouth 3 (three) times daily as needed. itching         . IBUPROFEN 200 MG PO TABS   Oral   Take 400 mg by mouth every 6 (six) hours as needed. Mild pain         . LAMOTRIGINE 100 MG PO TABS   Oral   Take 100-200 mg by mouth 2 (two) times daily. 1 tab in am, 2 tab in pm         .  ADULT MULTIVITAMIN W/MINERALS CH   Oral   Take 1 tablet by mouth daily.         Marland Kitchen MUPIROCIN CALCIUM 2 % EX CREA   Topical   Apply topically 3 (three) times daily.   15 g   0   . SIMVASTATIN 20 MG PO TABS   Oral   Take 20 mg by mouth every evening.         Marland Kitchen TRAMADOL HCL 50 MG PO TABS   Oral   Take 50 mg by mouth every 6 (six) hours as needed. For pain.           BP 114/69  Pulse 85  Temp 98.5 F (36.9 C)  Resp 20  SpO2 97%  Physical Exam General: Well-developed, well-nourished female in no acute distress; appears comfortable; appearance consistent with age of record HENT: normocephalic, atraumatic Eyes: pupils equal round and reactive to light; extraocular muscles intact Neck: supple Heart: regular rate and rhythm Lungs: clear to auscultation bilaterally Abdomen: soft; nondistended; nontender; no masses or hepatosplenomegaly; bowel sounds present Extremities: No deformity; full range of motion; pulses normal; swelling of left lateral malleolus Neurologic: Awake, alert; motor function intact  in all extremities and symmetric; no facial droop; ambulates without difficulty Skin: Warm and dry     ED Course  Procedures (including critical care time)     MDM   Nursing notes and vitals signs, including pulse oximetry, reviewed.  Summary of this visit's results, reviewed by myself:  Labs:  Results for orders placed during the hospital encounter of 02/28/12  URINALYSIS, ROUTINE W REFLEX MICROSCOPIC      Component Value Range   Color, Urine YELLOW  YELLOW   APPearance CLEAR  CLEAR   Specific Gravity, Urine 1.006  1.005 - 1.030   pH 7.0  5.0 - 8.0   Glucose, UA NEGATIVE  NEGATIVE mg/dL   Hgb urine dipstick NEGATIVE  NEGATIVE   Bilirubin Urine NEGATIVE  NEGATIVE   Ketones, ur NEGATIVE  NEGATIVE mg/dL   Protein, ur NEGATIVE  NEGATIVE mg/dL   Urobilinogen, UA 0.2  0.0 - 1.0 mg/dL   Nitrite NEGATIVE  NEGATIVE   Leukocytes, UA NEGATIVE  NEGATIVE  URINE RAPID DRUG SCREEN (HOSP PERFORMED)      Component Value Range   Opiates NONE DETECTED  NONE DETECTED   Cocaine POSITIVE (*) NONE DETECTED   Benzodiazepines NONE DETECTED  NONE DETECTED   Amphetamines NONE DETECTED  NONE DETECTED   Tetrahydrocannabinol NONE DETECTED  NONE DETECTED   Barbiturates NONE DETECTED  NONE DETECTED   6:46 AM Patient sleeping comfortably, arousable. Patient advised of her urine drug screen findings. She admits to using cocaine about a week ago. She was advised that I do not feel comfortable prescribing narcotics for people abusing drugs. She was advised to pursue her pain management plan.        Hanley Seamen, MD 02/28/12 [phone]

## 2012-04-30 ENCOUNTER — Emergency Department (HOSPITAL_COMMUNITY): Payer: PRIVATE HEALTH INSURANCE

## 2012-04-30 ENCOUNTER — Encounter (HOSPITAL_COMMUNITY): Payer: Self-pay | Admitting: Emergency Medicine

## 2012-04-30 ENCOUNTER — Emergency Department (HOSPITAL_COMMUNITY)
Admission: EM | Admit: 2012-04-30 | Discharge: 2012-05-01 | Disposition: A | Discharge status: Home / Self Care | Payer: PRIVATE HEALTH INSURANCE | Attending: Emergency Medicine | Admitting: Emergency Medicine

## 2012-04-30 DIAGNOSIS — IMO0001 Reserved for inherently not codable concepts without codable children: Secondary | ICD-10-CM | POA: Insufficient documentation

## 2012-04-30 DIAGNOSIS — F1410 Cocaine abuse, uncomplicated: Secondary | ICD-10-CM | POA: Insufficient documentation

## 2012-04-30 DIAGNOSIS — R5383 Other fatigue: Secondary | ICD-10-CM | POA: Insufficient documentation

## 2012-04-30 DIAGNOSIS — M79609 Pain in unspecified limb: Secondary | ICD-10-CM | POA: Insufficient documentation

## 2012-04-30 DIAGNOSIS — Z8614 Personal history of Methicillin resistant Staphylococcus aureus infection: Secondary | ICD-10-CM | POA: Insufficient documentation

## 2012-04-30 DIAGNOSIS — R51 Headache: Secondary | ICD-10-CM | POA: Insufficient documentation

## 2012-04-30 DIAGNOSIS — Z79899 Other long term (current) drug therapy: Secondary | ICD-10-CM | POA: Insufficient documentation

## 2012-04-30 DIAGNOSIS — R5381 Other malaise: Secondary | ICD-10-CM | POA: Insufficient documentation

## 2012-04-30 DIAGNOSIS — M791 Myalgia, unspecified site: Secondary | ICD-10-CM

## 2012-04-30 DIAGNOSIS — R059 Cough, unspecified: Secondary | ICD-10-CM | POA: Insufficient documentation

## 2012-04-30 DIAGNOSIS — R42 Dizziness and giddiness: Secondary | ICD-10-CM | POA: Insufficient documentation

## 2012-04-30 DIAGNOSIS — Z87448 Personal history of other diseases of urinary system: Secondary | ICD-10-CM | POA: Insufficient documentation

## 2012-04-30 DIAGNOSIS — Z8719 Personal history of other diseases of the digestive system: Secondary | ICD-10-CM | POA: Insufficient documentation

## 2012-04-30 DIAGNOSIS — G40909 Epilepsy, unspecified, not intractable, without status epilepticus: Secondary | ICD-10-CM | POA: Insufficient documentation

## 2012-04-30 DIAGNOSIS — R05 Cough: Secondary | ICD-10-CM | POA: Insufficient documentation

## 2012-04-30 DIAGNOSIS — R6883 Chills (without fever): Secondary | ICD-10-CM | POA: Insufficient documentation

## 2012-04-30 MED ORDER — SODIUM CHLORIDE 0.9 % IV BOLUS (SEPSIS)
1000.0000 mL | Freq: Once | INTRAVENOUS | Status: AC
  Administered 2012-05-01: 1000 mL via INTRAVENOUS

## 2012-04-30 NOTE — ED Notes (Signed)
Brought in by EMS from home with c/o chills and leg pain.  Pt presents to ED with c/o of same but now c/o "headache and blurred vision".  Pt reports that she took "crack" 3 hours ago.

## 2012-04-30 NOTE — ED Notes (Signed)
NWG:[id number]> Expected date:04/30/12<BR> Expected time: 9:34 PM<BR> Means of arrival:Ambulance<BR> Comments:<BR> Chills, leg pain

## 2012-05-01 LAB — URINALYSIS, ROUTINE W REFLEX MICROSCOPIC
Bilirubin Urine: NEGATIVE
Glucose, UA: NEGATIVE mg/dL
Ketones, ur: NEGATIVE mg/dL
Leukocytes, UA: NEGATIVE
Nitrite: NEGATIVE
Protein, ur: NEGATIVE mg/dL
Specific Gravity, Urine: 1.012 (ref 1.005–1.030)
Urobilinogen, UA: 0.2 mg/dL (ref 0.0–1.0)
pH: 5.5 (ref 5.0–8.0)

## 2012-05-01 LAB — COMPREHENSIVE METABOLIC PANEL WITH GFR
ALT: 11 U/L (ref 0–35)
AST: 17 U/L (ref 0–37)
Albumin: 3.8 g/dL (ref 3.5–5.2)
Alkaline Phosphatase: 73 U/L (ref 39–117)
BUN: 16 mg/dL (ref 6–23)
CO2: 23 meq/L (ref 19–32)
Calcium: 9.1 mg/dL (ref 8.4–10.5)
Chloride: 98 meq/L (ref 96–112)
Creatinine, Ser: 0.91 mg/dL (ref 0.50–1.10)
GFR calc Af Amer: 78 mL/min — ABNORMAL LOW
GFR calc non Af Amer: 68 mL/min — ABNORMAL LOW
Glucose, Bld: 102 mg/dL — ABNORMAL HIGH (ref 70–99)
Potassium: 4.1 meq/L (ref 3.5–5.1)
Sodium: 134 meq/L — ABNORMAL LOW (ref 135–145)
Total Bilirubin: 0.5 mg/dL (ref 0.3–1.2)
Total Protein: 6.7 g/dL (ref 6.0–8.3)

## 2012-05-01 LAB — CBC WITH DIFFERENTIAL/PLATELET
Basophils Absolute: 0 10*3/uL (ref 0.0–0.1)
Basophils Relative: 0 % (ref 0–1)
Eosinophils Absolute: 0.1 10*3/uL (ref 0.0–0.7)
Eosinophils Relative: 1 % (ref 0–5)
HCT: 36.7 % (ref 36.0–46.0)
Hemoglobin: 12.8 g/dL (ref 12.0–15.0)
Lymphocytes Relative: 8 % — ABNORMAL LOW (ref 12–46)
Lymphs Abs: 1.1 10*3/uL (ref 0.7–4.0)
MCH: 31.9 pg (ref 26.0–34.0)
MCHC: 34.9 g/dL (ref 30.0–36.0)
MCV: 91.5 fL (ref 78.0–100.0)
Monocytes Absolute: 0.8 10*3/uL (ref 0.1–1.0)
Monocytes Relative: 6 % (ref 3–12)
Neutro Abs: 11 10*3/uL — ABNORMAL HIGH (ref 1.7–7.7)
Neutrophils Relative %: 85 % — ABNORMAL HIGH (ref 43–77)
Platelets: 216 10*3/uL (ref 150–400)
RBC: 4.01 MIL/uL (ref 3.87–5.11)
RDW: 14 % (ref 11.5–15.5)
WBC: 13 10*3/uL — ABNORMAL HIGH (ref 4.0–10.5)

## 2012-05-01 LAB — URINE MICROSCOPIC-ADD ON

## 2012-05-01 LAB — LACTIC ACID, PLASMA: Lactic Acid, Venous: 1.8 mmol/L (ref 0.5–2.2)

## 2012-05-01 MED ORDER — ACETAMINOPHEN 325 MG PO TABS
650.0000 mg | ORAL_TABLET | Freq: Once | ORAL | Status: AC
  Administered 2012-05-01: 650 mg via ORAL
  Filled 2012-05-01: qty 2

## 2012-05-01 NOTE — ED Notes (Signed)
Pt has been ambulating well down the hall, has also been ambulating well  to the BR.

## 2012-05-01 NOTE — ED Notes (Signed)
Pt ambulated well to the bathroom without assistance. 

## 2012-05-01 NOTE — ED Provider Notes (Signed)
History     CSN: [id number]  Arrival date & time 04/30/12  2143   First MD Initiated Contact with Patient 04/30/12 2319      Chief Complaint  Patient presents with  . Chills  . Leg Pain  . Headache    (Consider location/radiation/quality/duration/timing/severity/associated sxs/prior treatment) HPI 62 year old female presents emergency department via EMS with complaints of chills, bilateral lower leg pain, persistent cough, headache, and vertigo and difficulties walking. Patient reports she has been smoking crack this afternoon, and cannot specifically say when her symptoms started. She reports she has a history of head injury and seizures, reports she's been seizure-free for 5 months. She is followed by Dr. Danae Orleans.  Patient is requesting help with getting off crack in addition to her medical complaints today. Patient reports she was smoking crack around 5 PM and felt fine. She got up to make herself a snack, and then began to feel unwell with chills and body aches. She reports she did get a flu shot this year. She has not checked her temperature at home. She reports she's had ongoing coughing for the last 3 or 4 months. She is a heavy smoker of tobacco as well as crack.  Past Medical History  Diagnosis Date  . Seizures   . Gastric ulcer   . MRSA (methicillin resistant staph aureus) culture positive   . Interstitial cystitis     Past Surgical History  Procedure Date  . Shoulder arthroscopy   . Rotator cuff repair     R side    History reviewed. No pertinent family history.  History  Substance Use Topics  . Smoking status: Current Every Day Smoker  . Smokeless tobacco: Not on file  . Alcohol Use: No    OB History    Grav Para Term Preterm Abortions TAB SAB Ect Mult Living                  Review of Systems  Constitutional: Positive for chills, activity change, appetite change and fatigue. Negative for fever and unexpected weight change.  Respiratory: Positive for  cough.   Gastrointestinal: Negative.   Musculoskeletal: Positive for myalgias, arthralgias and gait problem.  Skin: Positive for wound.  Neurological: Positive for dizziness, weakness and headaches.    Allergies  Haloperidol decanoate  Home Medications   Current Outpatient Rx  Name  Route  Sig  Dispense  Refill  . AMITRIPTYLINE HCL 100 MG PO TABS   Oral   Take 100 mg by mouth at bedtime as needed. sleep         . CLONAZEPAM 0.5 MG PO TABS   Oral   Take 0.5 mg by mouth 2 (two) times daily as needed. For anxiety         . IBUPROFEN 200 MG PO TABS   Oral   Take 400 mg by mouth every 6 (six) hours as needed. Pain         . LAMOTRIGINE 100 MG PO TABS   Oral   Take 50 mg by mouth 4 (four) times daily.          . ADULT MULTIVITAMIN W/MINERALS CH   Oral   Take 1 tablet by mouth daily.         Marland Kitchen RISPERIDONE 1 MG PO TABS   Oral   Take 2 mg by mouth 2 (two) times daily.         Marland Kitchen SIMVASTATIN 20 MG PO TABS   Oral   Take 20  mg by mouth every evening.         Marland Kitchen TRAMADOL HCL 50 MG PO TABS   Oral   Take 50 mg by mouth every 6 (six) hours as needed. For pain.           BP 95/58  Pulse 94  Temp 99 F (37.2 C) (Oral)  Resp 18  SpO2 96%  Physical Exam  Constitutional: She is oriented to person, place, and time. No distress.       Patient is chronically ill-appearing, older than stated age  HENT:  Head: Normocephalic and atraumatic.  Right Ear: External ear normal.  Left Ear: External ear normal.  Nose: Nose normal.  Mouth/Throat: Oropharynx is clear and moist. No oropharyngeal exudate.  Eyes: Conjunctivae normal and EOM are normal. Pupils are equal, round, and reactive to light. Right eye exhibits no discharge. Left eye exhibits no discharge. No scleral icterus.  Neck: Normal range of motion. Neck supple. No JVD present. No tracheal deviation present. No thyromegaly present.  Cardiovascular: Normal rate and regular rhythm.  Exam reveals no gallop and no  friction rub.   Murmur (loud systolic murmur noted) heard. Pulmonary/Chest: Effort normal and breath sounds normal. No stridor. No respiratory distress. She has no wheezes. She has no rales. She exhibits no tenderness.  Abdominal: Soft. Bowel sounds are normal. She exhibits no distension and no mass. There is no tenderness. There is no rebound and no guarding.  Musculoskeletal: She exhibits no edema and no tenderness.       Abrasion to left shin  Lymphadenopathy:    She has no cervical adenopathy.  Neurological: She is alert and oriented to person, place, and time. She displays normal reflexes. No cranial nerve deficit. She exhibits normal muscle tone. 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 Romberg is normal) abnormal.  Skin: Skin is warm and dry. No rash noted. She is not diaphoretic. No erythema. No pallor.  Psychiatric: She has a normal mood and affect. Her behavior is normal. Judgment and thought content normal.    ED Course  Procedures (including critical care time)  Labs Reviewed  CBC WITH DIFFERENTIAL - Abnormal; Notable for the following:    WBC 13.0 (*)     Neutrophils Relative 85 (*)     Neutro Abs 11.0 (*)     Lymphocytes Relative 8 (*)     All other components within normal limits  COMPREHENSIVE METABOLIC PANEL - Abnormal; Notable for the following:    Sodium 134 (*)     Glucose, Bld 102 (*)     GFR calc non Af Amer 68 (*)     GFR calc Af Amer 78 (*)     All other components within normal limits  URINALYSIS, ROUTINE W REFLEX MICROSCOPIC - Abnormal; Notable for the following:    Hgb urine dipstick SMALL (*)     All other components within normal limits  LACTIC ACID, PLASMA  URINE MICROSCOPIC-ADD ON  CULTURE, BLOOD (ROUTINE X 2)  CULTURE, BLOOD (ROUTINE X 2)   Dg Chest 2 View  05/01/2012  *RADIOLOGY REPORT*  Clinical Data: Cough, headache.  CHEST - 2 VIEW  Comparison: 12/15/2010  Findings: New coarse linear subsegmental  atelectasis, infiltrate, or scarring laterally in both lung bases.  Mild perihilar and bibasilar interstitial edema or infiltrate.  There is stable bilateral infrahilar scarring or atelectasis.  No effusion.  Heart size normal.  Regional bones unremarkable.  IMPRESSION:  1.  Mild perihilar and bibasilar interstitial  edema or infiltrates.   Original Report Authenticated By: D. Andria Rhein, MD    Ct Head Wo Contrast  05/01/2012  *RADIOLOGY REPORT*  Clinical Data: Headache, vertigo.  CT HEAD WITHOUT CONTRAST  Technique:  Contiguous axial images were obtained from the base of the skull through the vertex without contrast.  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 There is no evidence of acute intracranial hemorrhage, brain edema, mass lesion, acute infarction,   mass effect, or midline shift. Acute infarct may be inapparent on noncontrast CT.  No other intra-axial abnormalities are seen, and the ventricles and sulci are within normal limits in size and symmetry.   No abnormal extra-axial fluid collections or masses are identified.  No significant calvarial abnormality.  IMPRESSION: 1. Negative for bleed or other acute intracranial process. 2.  Stable left frontal encephalomalacia.   Original Report Authenticated By: D. Andria Rhein, MD      1. Cocaine abuse   2. Headache   3. Chills (without fever)   4. Myalgia       MDM  62 year old female with vertigo, headache, myalgias and new murmur on today's exam with recent use of crack, reported chills and myalgias.  Pt is very unreliable historian, cannot gauge a time of onset of vertigo/ataxia.  Will get labs, CT head, chest xray.    2:51 AM Patient has had resolution of all of her symptoms, no longer has dizziness or ataxia. I do not feel this is secondary to a TIA, most likely secondary to her crack cocaine use earlier in the evening, possible atypical migraine. Workup has been unremarkable. We'll refer her to her  primary care Dr. she no longer wants help for her crack cocaine abuse        Olivia Mackie, MD 05/01/12 [phone]

## 2012-05-01 NOTE — Discharge Instructions (Signed)
 Try to avoid using cocaine. Drink plan fluids. Rest. Return emergency room for worsening condition or new concerning symptoms  Chemical Dependency Chemical dependency is an addiction to drugs or alcohol . It is characterized by the repeated behavior of seeking out and using drugs and alcohol  despite harmful consequences to the health and safety of ones self and others.  RISK FACTORS There are certain situations or behaviors that increase a person's risk for chemical dependency. These include:  A family history of chemical dependency.  A history of mental health issues, including depression and anxiety.  A home environment where drugs and alcohol  are easily available to you.  Drug or alcohol  use at a young age. SYMPTOMS  The following symptoms can indicate chemical dependency:  Inability to limit the use of drugs or alcohol .  Nausea, sweating, shakiness, and anxiety that occurs when alcohol  or drugs are not being used.  An increase in amount of drugs or alcohol  that is necessary to get drunk or high. People who experience these symptoms can assess their use of drugs and alcohol  by asking themselves the following questions:  Have you been told by friends or family that they are worried about your use of alcohol  or drugs?  Do friends and family ever tell you about things you did while drinking alcohol  or using drugs that you do not remember?  Do you lie about using alcohol  or drugs or about the amounts you use?  Do you have difficulty completing daily tasks unless you use alcohol  or drugs?  Is the level of your work or school performance lower because of your drug or alcohol  use?  Do you get sick from using drugs or alcohol  but keep using anyway?  Do you feel uncomfortable in social situations unless you use alcohol  or drugs?  Do you use drugs or alcohol  to help forget problems? An answer of yes to any of these questions may indicate chemical dependency. Professional evaluation is  suggested. Document Released: 03/29/2001 Document Revised: 06/27/2011 Document Reviewed: 06/10/2010 [facility] Patient Information 2013 [place], [place]. Cocaine Abuse PROBLEMS FROM USING COCAINE:   Highly addictive.  Illegal.  Risk of sudden death.  Heart disease.  Irregular heart beat.  High blood pressure.  Damage to nose and lungs.  Severe agitation.  Hallucinations.  Violent behavior.  Paranoia.  Sexual dysfunction. Most cocaine users deny that they have a problem with addiction. The biggest problem is admitting that you are dependent on cocaine. Those trying to quit using it may experience depression and withdrawal symptoms. Other withdrawal symptoms include fatigue, suicidal thoughts, sleepiness, restlessness, anxiety, and increased craving for cocaine. There are medications available to help prevent depression associated with stopping cocaine. Most users will find a support group or treatment program helpful in coming off and staying off cocaine. The best chance to cure cocaine addiction is to go into group therapy and to be in a drug-free environment. It is very important to develop healthy relationships and avoid socializing with people who use or deal drugs. Eat well, and give your body the proper rest and healthy exercise it needs. You may need medication to help treat withdrawal symptoms. Call your caregiver or a drug treatment center for more help.  You may also want to call the [facility] on Drug Abuse at 800-662-HELP in the [place] SEEK IMMEDIATE MEDICAL CARE IF:  You develop severe chest pain.  You develop shortness of breath.  You develop extreme agitation. Document Released: 05/12/2004 Document Revised: 06/27/2011 Document Reviewed: 02/04/2009 [employer] Patient  Information 2013 [place], [place]. General Headache Without Cause A headache is pain or discomfort felt around the head or neck area. The specific cause of a headache may not be found. There are  many causes and types of headaches. A few common ones are:  Tension headaches.  Migraine headaches.  Cluster headaches.  Chronic daily headaches. HOME CARE INSTRUCTIONS   Keep all follow-up appointments with your caregiver or any specialist referral.  Only take over-the-counter or prescription medicines for pain or discomfort as directed by your caregiver.  Lie down in a dark, quiet room when you have a headache.  Keep a headache journal to find out what may trigger your migraine headaches. For example, write down:  What you eat and drink.  How much sleep you get.  Any change to your diet or medicines.  Try massage or other relaxation techniques.  Put ice packs or heat on the head and neck. Use these 3 to 4 times per day for 15 to 20 minutes each time, or as needed.  Limit stress.  Sit up straight, and do not tense your muscles.  Quit smoking if you smoke.  Limit alcohol  use.  Decrease the amount of caffeine  you drink, or stop drinking caffeine .  Eat and sleep on a regular schedule.  Get 7 to 9 hours of sleep, or as recommended by your caregiver.  Keep lights dim if bright lights bother you and make your headaches worse. SEEK MEDICAL CARE IF:   You have problems with the medicines you were prescribed.  Your medicines are not working.  You have a change from the usual headache.  You have nausea or vomiting. SEEK IMMEDIATE MEDICAL CARE IF:   Your headache becomes severe.  You have a fever.  You have a stiff neck.  You have loss of vision.  You have muscular weakness or loss of muscle control.  You start losing your balance or have trouble walking.  You feel faint or pass out.  You have severe symptoms that are different from your first symptoms. MAKE SURE YOU:   Understand these instructions.  Will watch your condition.  Will get help right away if you are not doing well or get worse. Document Released: 04/04/2005 Document Revised: 06/27/2011  Document Reviewed: 04/20/2011 [facility] Patient Information 2013 [place], [place]. Musculoskeletal Pain Musculoskeletal pain is muscle and boney aches and pains. These pains can occur in any part of the body. Your caregiver may treat you without knowing the cause of the pain. They may treat you if blood or urine tests, X-rays, and other tests were normal.  CAUSES There is often not a definite cause or reason for these pains. These pains may be caused by a type of germ (virus). The discomfort may also come from overuse. Overuse includes working out too hard when your body is not fit. Boney aches also come from weather changes. Bone is sensitive to atmospheric pressure changes. HOME CARE INSTRUCTIONS   Ask when your test results will be ready. Make sure you get your test results.  Only take over-the-counter or prescription medicines for pain, discomfort, or fever as directed by your caregiver. If you were given medications for your condition, do not drive, operate machinery or power tools, or sign legal documents for 24 hours. Do not drink alcohol . Do not take sleeping pills or other medications that may interfere with treatment.  Continue all activities unless the activities cause more pain. When the pain lessens, slowly resume normal activities. Gradually increase the intensity  and duration of the activities or exercise.  During periods of severe pain, bed rest may be helpful. Lay or sit in any position that is comfortable.  Putting ice on the injured area.  Put ice in a bag.  Place a towel between your skin and the bag.  Leave the ice on for 15 to 20 minutes, 3 to 4 times a day.  Follow up with your caregiver for continued problems and no reason can be found for the pain. If the pain becomes worse or does not go away, it may be necessary to repeat tests or do additional testing. Your caregiver may need to look further for a possible cause. SEEK IMMEDIATE MEDICAL CARE IF:  You have pain that  is getting worse and is not relieved by medications.  You develop chest pain that is associated with shortness or breath, sweating, feeling sick to your stomach (nauseous), or throw up (vomit).  Your pain becomes localized to the abdomen.  You develop any new symptoms that seem different or that concern you. MAKE SURE YOU:   Understand these instructions.  Will watch your condition.  Will get help right away if you are not doing well or get worse. Document Released: 04/04/2005 Document Revised: 06/27/2011 Document Reviewed: 11/23/2007 [facility] Patient Information 2013 [place], [place].

## 2012-05-01 NOTE — ED Notes (Signed)
Blood cultures x 2 obtained from right forearm and left hand.

## 2012-05-07 LAB — CULTURE, BLOOD (ROUTINE X 2)
Culture: NO GROWTH
Culture: NO GROWTH

## 2012-07-07 ENCOUNTER — Other Ambulatory Visit: Payer: Self-pay | Admitting: Diagnostic Neuroimaging

## 2012-08-15 ENCOUNTER — Encounter (HOSPITAL_COMMUNITY): Payer: Self-pay | Admitting: *Deleted

## 2012-08-15 ENCOUNTER — Emergency Department (HOSPITAL_COMMUNITY)
Admission: EM | Admit: 2012-08-15 | Discharge: 2012-08-15 | Disposition: A | Discharge status: Home / Self Care | Payer: PRIVATE HEALTH INSURANCE | Attending: Emergency Medicine | Admitting: Emergency Medicine

## 2012-08-15 DIAGNOSIS — F17200 Nicotine dependence, unspecified, uncomplicated: Secondary | ICD-10-CM | POA: Insufficient documentation

## 2012-08-15 DIAGNOSIS — F329 Major depressive disorder, single episode, unspecified: Secondary | ICD-10-CM | POA: Insufficient documentation

## 2012-08-15 DIAGNOSIS — F1910 Other psychoactive substance abuse, uncomplicated: Secondary | ICD-10-CM

## 2012-08-15 DIAGNOSIS — G40909 Epilepsy, unspecified, not intractable, without status epilepticus: Secondary | ICD-10-CM | POA: Insufficient documentation

## 2012-08-15 DIAGNOSIS — Z87448 Personal history of other diseases of urinary system: Secondary | ICD-10-CM | POA: Insufficient documentation

## 2012-08-15 DIAGNOSIS — Z8614 Personal history of Methicillin resistant Staphylococcus aureus infection: Secondary | ICD-10-CM | POA: Insufficient documentation

## 2012-08-15 DIAGNOSIS — F1410 Cocaine abuse, uncomplicated: Secondary | ICD-10-CM | POA: Insufficient documentation

## 2012-08-15 DIAGNOSIS — F1310 Sedative, hypnotic or anxiolytic abuse, uncomplicated: Secondary | ICD-10-CM | POA: Insufficient documentation

## 2012-08-15 DIAGNOSIS — Z8659 Personal history of other mental and behavioral disorders: Secondary | ICD-10-CM | POA: Insufficient documentation

## 2012-08-15 DIAGNOSIS — Z8711 Personal history of peptic ulcer disease: Secondary | ICD-10-CM | POA: Insufficient documentation

## 2012-08-15 DIAGNOSIS — F32A Depression, unspecified: Secondary | ICD-10-CM

## 2012-08-15 DIAGNOSIS — F3289 Other specified depressive episodes: Secondary | ICD-10-CM | POA: Insufficient documentation

## 2012-08-15 DIAGNOSIS — Z79899 Other long term (current) drug therapy: Secondary | ICD-10-CM | POA: Insufficient documentation

## 2012-08-15 HISTORY — DX: Unspecified mood (affective) disorder: F39

## 2012-08-15 HISTORY — DX: Depression, unspecified: F32.A

## 2012-08-15 HISTORY — DX: Major depressive disorder, single episode, unspecified: F32.9

## 2012-08-15 LAB — COMPREHENSIVE METABOLIC PANEL WITH GFR
ALT: 14 U/L (ref 0–35)
AST: 16 U/L (ref 0–37)
Albumin: 3.5 g/dL (ref 3.5–5.2)
Alkaline Phosphatase: 70 U/L (ref 39–117)
BUN: 16 mg/dL (ref 6–23)
CO2: 26 meq/L (ref 19–32)
Calcium: 9.4 mg/dL (ref 8.4–10.5)
Chloride: 99 meq/L (ref 96–112)
Creatinine, Ser: 1.04 mg/dL (ref 0.50–1.10)
GFR calc Af Amer: 67 mL/min — ABNORMAL LOW
GFR calc non Af Amer: 58 mL/min — ABNORMAL LOW
Glucose, Bld: 55 mg/dL — ABNORMAL LOW (ref 70–99)
Potassium: 3.2 meq/L — ABNORMAL LOW (ref 3.5–5.1)
Sodium: 136 meq/L (ref 135–145)
Total Bilirubin: 0.3 mg/dL (ref 0.3–1.2)
Total Protein: 6.3 g/dL (ref 6.0–8.3)

## 2012-08-15 LAB — URINALYSIS, ROUTINE W REFLEX MICROSCOPIC
Bilirubin Urine: NEGATIVE
Glucose, UA: NEGATIVE mg/dL
Hgb urine dipstick: NEGATIVE
Ketones, ur: NEGATIVE mg/dL
Leukocytes, UA: NEGATIVE
Nitrite: NEGATIVE
Protein, ur: NEGATIVE mg/dL
Specific Gravity, Urine: 1.012 (ref 1.005–1.030)
Urobilinogen, UA: 0.2 mg/dL (ref 0.0–1.0)
pH: 5 (ref 5.0–8.0)

## 2012-08-15 LAB — CBC WITH DIFFERENTIAL/PLATELET
Basophils Absolute: 0 10*3/uL (ref 0.0–0.1)
Basophils Relative: 0 % (ref 0–1)
Eosinophils Absolute: 0.3 10*3/uL (ref 0.0–0.7)
Eosinophils Relative: 3 % (ref 0–5)
HCT: 36.5 % (ref 36.0–46.0)
Hemoglobin: 12.7 g/dL (ref 12.0–15.0)
Lymphocytes Relative: 33 % (ref 12–46)
Lymphs Abs: 2.5 10*3/uL (ref 0.7–4.0)
MCH: 31.5 pg (ref 26.0–34.0)
MCHC: 34.8 g/dL (ref 30.0–36.0)
MCV: 90.6 fL (ref 78.0–100.0)
Monocytes Absolute: 0.3 10*3/uL (ref 0.1–1.0)
Monocytes Relative: 4 % (ref 3–12)
Neutro Abs: 4.7 10*3/uL (ref 1.7–7.7)
Neutrophils Relative %: 60 % (ref 43–77)
Platelets: 236 10*3/uL (ref 150–400)
RBC: 4.03 MIL/uL (ref 3.87–5.11)
RDW: 13.4 % (ref 11.5–15.5)
WBC: 7.8 10*3/uL (ref 4.0–10.5)

## 2012-08-15 LAB — RAPID URINE DRUG SCREEN, HOSP PERFORMED
Amphetamines: NOT DETECTED
Barbiturates: NOT DETECTED
Benzodiazepines: NOT DETECTED
Cocaine: POSITIVE — AB
Opiates: NOT DETECTED
Tetrahydrocannabinol: NOT DETECTED

## 2012-08-15 LAB — ETHANOL: Alcohol, Ethyl (B): 15 mg/dL — ABNORMAL HIGH (ref 0–11)

## 2012-08-15 LAB — ACETAMINOPHEN LEVEL: Acetaminophen (Tylenol), Serum: <15 ug/mL (ref 10–30)

## 2012-08-15 MED ORDER — SIMVASTATIN 20 MG PO TABS
20.0000 mg | ORAL_TABLET | Freq: Every evening | ORAL | Status: DC
  Filled 2012-08-15: qty 1

## 2012-08-15 MED ORDER — LORAZEPAM 1 MG PO TABS: 1.0000 mg | ORAL_TABLET | Freq: Four times a day (QID) | ORAL | Status: DC | PRN

## 2012-08-15 MED ORDER — ADULT MULTIVITAMIN W/MINERALS CH
1.0000 | ORAL_TABLET | Freq: Every day | ORAL | Status: DC
  Administered 2012-08-15: 1 via ORAL
  Filled 2012-08-15: qty 1

## 2012-08-15 MED ORDER — LAMOTRIGINE 200 MG PO TABS
200.0000 mg | ORAL_TABLET | Freq: Every day | ORAL | Status: DC
  Filled 2012-08-15: qty 1

## 2012-08-15 MED ORDER — FOLIC ACID 1 MG PO TABS
1.0000 mg | ORAL_TABLET | Freq: Every day | ORAL | Status: DC
  Administered 2012-08-15: 1 mg via ORAL
  Filled 2012-08-15: qty 1

## 2012-08-15 MED ORDER — LORAZEPAM 2 MG/ML IJ SOLN: 1.0000 mg | Freq: Four times a day (QID) | INTRAMUSCULAR | Status: DC | PRN

## 2012-08-15 MED ORDER — AMITRIPTYLINE HCL 25 MG PO TABS: 100.0000 mg | ORAL_TABLET | Freq: Every evening | ORAL | Status: DC | PRN

## 2012-08-15 MED ORDER — ALUM & MAG HYDROXIDE-SIMETH 200-200-20 MG/5ML PO SUSP: 30.0000 mL | ORAL | Status: DC | PRN

## 2012-08-15 MED ORDER — THIAMINE HCL 100 MG/ML IJ SOLN: 100.0000 mg | Freq: Every day | INTRAMUSCULAR | Status: DC

## 2012-08-15 MED ORDER — LORAZEPAM 1 MG PO TABS: 0.0000 mg | ORAL_TABLET | Freq: Two times a day (BID) | ORAL | Status: DC

## 2012-08-15 MED ORDER — LAMOTRIGINE 100 MG PO TABS
100.0000 mg | ORAL_TABLET | Freq: Every morning | ORAL | Status: DC
  Administered 2012-08-15: 100 mg via ORAL
  Filled 2012-08-15 (×2): qty 1

## 2012-08-15 MED ORDER — VITAMIN B-1 100 MG PO TABS
100.0000 mg | ORAL_TABLET | Freq: Every day | ORAL | Status: DC
  Administered 2012-08-15: 100 mg via ORAL
  Filled 2012-08-15: qty 1

## 2012-08-15 MED ORDER — RISPERIDONE 2 MG PO TABS
2.0000 mg | ORAL_TABLET | Freq: Two times a day (BID) | ORAL | Status: DC
  Administered 2012-08-15: 2 mg via ORAL
  Filled 2012-08-15: qty 1

## 2012-08-15 MED ORDER — NICOTINE 21 MG/24HR TD PT24: 21.0000 mg | MEDICATED_PATCH | Freq: Every day | TRANSDERMAL | Status: DC

## 2012-08-15 MED ORDER — LORAZEPAM 1 MG PO TABS
0.0000 mg | ORAL_TABLET | Freq: Four times a day (QID) | ORAL | Status: DC
  Administered 2012-08-15: 1 mg via ORAL
  Filled 2012-08-15: qty 1

## 2012-08-15 NOTE — ED Notes (Signed)
Patient is alert and oriented x3.  She is complaining of depression and being off her regular medication. She states that she has been "bouncing off the walls"  And this started about a week and a half ago.   She states that she has been unable to sleep during this time.  She states she has a bad headache but  No nausea or vomiting.  She denies SI/HI

## 2012-08-15 NOTE — BH Assessment (Signed)
Assessment Note   [name] is an 62 y.o. female with history of depression and mood disorder. She presents to [facility] today with complaints of depression. She denies any specific stressors. Also says that she feels increasingly anxious "bouncing off the walls". Patient also unable to sleep. Symptoms started approx. 2 weeks ago. Patient today denies SI, HI, and AVH's. Sts that she has a history of self mutilating (cutting) as a teenager. No current or recent issues with self mutilating. Patient does not have any outpatient providers-psychiatrist/therapist at this time. However, sts that her PCP was prescribing medications to her in the past to help with anxiety and depression.   Patient using opiates-vicodin, alcohol, and crack cocaine starting at age 37. Patient explains that her opiates are prescribed as "take 1 pill every 4 hours". Pt sts, "I purposely take them the opposite .Marland KitchenMarland Kitchen"I take 4 pills every 1 hour". She last took opiates several weeks ago.   Patient reports alcohol use approximately 2x's per week. She typically drinks a 24oz can of fruit flavored beer with 12% alcohol. She drank alcohol last night. The BAL is 15.   Patient also using crack cocaine. Sts that the use varies with her social environment stating that her neighbors come over periodically and bring the drugs. The amount varies but averages $20 worth with each use.   Patient denies withdrawal symptoms at this time. She denies history of black outs. She does have a history of seizures. Her reported last seizure was 8-9 months ago.   Patient has received treatment in the past at the following facilities: [facility], [facility] in [facility], [facility] in [place]-1999, and [facility] [phone]. Patient was hospitalized for cutting, depression, anxiety, and mostly substance related issues.   Patient referred to CD-IOP at [facility]. She will meet with Daneil Dolin Thursday 08/16/2012 at 10 am for orientation. She  will then officially start the program Friday 08/17/12 @ 1pm-4pm.   Axis I: Polysubstance abuse, Depressive Disorder and Anxiety Disorder NOS Axis II: Deferred Axis III:  Past Medical History  Diagnosis Date  . Seizures   . Gastric ulcer   . MRSA (methicillin resistant staph aureus) culture positive   . Interstitial cystitis   . Depression   . Mood disorder    Axis IV: other psychosocial or environmental problems, problems related to social environment, problems with access to health care services and problems with primary support group Axis V: 31-40 impairment in reality testing   Past Medical History:  Past Medical History  Diagnosis Date  . Seizures   . Gastric ulcer   . MRSA (methicillin resistant staph aureus) culture positive   . Interstitial cystitis   . Depression   . Mood disorder     Past Surgical History  Procedure Laterality Date  . Shoulder arthroscopy    . Rotator cuff repair      R side    Family History: History reviewed. No pertinent family history.  Social History:  reports that she has been smoking.  She does not have any smokeless tobacco history on file. She reports that she uses illicit drugs (Cocaine). She reports that she does not drink alcohol.  Additional Social History:  Alcohol / Drug Use Pain Medications: SEE MAR Prescriptions: SEE MAR Over the Counter: SEE MAR History of alcohol / drug use?: Yes Negative Consequences of Use: Personal relationships Substance #1 Name of Substance 1: Opiates-Vicodin 1 - Age of First Use: 62 yrs old  1 -  Amount (size/oz): Patient takes 4 pills every hour, however; the prescription reads 1 pill every 4 hours 1 - Frequency: daily  1 - Duration: on-going since age 6 1 - Last Use / Amount: "Several weeks ago" Substance #2 Name of Substance 2: Alcohol  2 - Age of First Use: 62 yrs old  2 - Amount (size/oz): (1-2) 24oz "fruit flavored beers" 12% alcohol  2 - Frequency: 2x's per week 2 - Duration: on-going  since age 75 2 - Last Use / Amount: 08/14/2012 "last night" Substance #3 Name of Substance 3: Crack Cocaine 3 - Age of First Use: "I started in 1999" 3 - Amount (size/oz): varies-averages $20 with each use 3 - Frequency: varies; "socially uses with neighbors and friends" 3 - Duration: since 1999 3 - Last Use / Amount: 08/14/12  CIWA: CIWA-Ar BP: 108/71 mmHg Pulse Rate: 72 Nausea and Vomiting: no nausea and no vomiting Tactile Disturbances: none Tremor: no tremor Auditory Disturbances: not present Paroxysmal Sweats: no sweat visible Visual Disturbances: not present Anxiety: three Headache, Fullness in Head: none present Agitation: three Orientation and Clouding of Sensorium: oriented and can do serial additions CIWA-Ar Total: 6 COWS:    Allergies:  Allergies  Allergen Reactions  . Haloperidol Decanoate Anaphylaxis    Home Medications:  (Not in a hospital admission)  OB/GYN Status:  No LMP recorded. Patient is postmenopausal.  General Assessment Data Location of Assessment: [facility] Living Arrangements: Alone Can pt return to current living arrangement?: Yes Admission Status: Voluntary Is patient capable of signing voluntary admission?: Yes Transfer from: Acute Hospital Referral Source: Self/Family/Friend     Risk to self Suicidal Ideation: No Suicidal Intent: No Is patient at risk for suicide?: No Suicidal Plan?: No Access to Means: No What has been your use of drugs/alcohol within the last 12 months?:  (patient reports narcotic-Vicodin, THC, alcohol, crack cocain) Previous Attempts/Gestures: No How many times?:  (n/a) Other Self Harm Risks:  (n/a) Triggers for Past Attempts: Other (Comment) (no previous attempts and/or gestures) Intentional Self Injurious Behavior: None (no current self injurious behaviors; hx of cutting -teenager) Family Suicide History: No Recent stressful life event(s): Other (Comment) (on-going substance abuse issues) Persecutory  voices/beliefs?: No Depression: Yes Depression Symptoms: Feeling angry/irritable;Feeling worthless/self pity;Loss of interest in usual pleasures;Fatigue;Isolating;Tearfulness;Despondent Substance abuse history and/or treatment for substance abuse?: No Suicide prevention information given to non-admitted patients: Not applicable  Risk to Others Homicidal Ideation: No Thoughts of Harm to Others: No Current Homicidal Intent: No Current Homicidal Plan: No Access to Homicidal Means: No Identified Victim:  (n/a) History of harm to others?: No Assessment of Violence: None Noted Violent Behavior Description:  (patient is calm and cooperative) Does patient have access to weapons?: No Criminal Charges Pending?: No Does patient have a court date: No  Psychosis Hallucinations: None noted Delusions: None noted  Mental Status Report Appear/Hygiene: Disheveled Eye Contact: Good Motor Activity: Freedom of movement Speech: Logical/coherent Level of Consciousness: Alert Mood: Depressed Affect: Appropriate to circumstance Anxiety Level: None Thought Processes: Coherent Judgement: Unimpaired Orientation: Person;Place;Time;Situation Obsessive Compulsive Thoughts/Behaviors: None  Cognitive Functioning Concentration: Decreased Memory: Recent Intact;Remote Intact IQ: Average Insight: Good Impulse Control: Fair Appetite: Fair Weight Loss:  (none reported) Weight Gain:  (none reported) Sleep: Decreased Total Hours of Sleep:  (unk) Vegetative Symptoms: None  ADLScreening [facility] Assessment Services) Patient's cognitive ability adequate to safely complete daily activities?: Yes Patient able to express need for assistance with ADLs?: Yes Independently performs ADLs?: Yes (appropriate for developmental age)  Abuse/Neglect [facility])  Physical Abuse: Denies Verbal Abuse: Denies Sexual Abuse: Denies  Prior Inpatient Therapy Prior Inpatient Therapy: Yes Prior Therapy Dates:  Loleta Dicker  y/o, [address] [facility], [facility] Prior Therapy Facilty/Provider(s):  (John Umstead, [address] [facility], [facility] ) Reason for Treatment:  (substance abuse, depression, cutting (self mutilation))  Prior Outpatient Therapy Prior Outpatient Therapy: No Prior Therapy Dates:  (n/a) Prior Therapy Facilty/Provider(s):  (n/a) Reason for Treatment:  (n/a)  ADL Screening (condition at time of admission) Patient's cognitive ability adequate to safely complete daily activities?: Yes Patient able to express need for assistance with ADLs?: Yes Independently performs ADLs?: Yes (appropriate for developmental age) Weakness of Legs: None Weakness of Arms/Hands: None  Home Assistive Devices/Equipment Home Assistive Devices/Equipment: None    Abuse/Neglect Assessment (Assessment to be complete while patient is alone) Physical Abuse: Denies Verbal Abuse: Denies Sexual Abuse: Denies Exploitation of patient/patient's resources: Denies Self-Neglect: Denies Values / Beliefs Cultural Requests During Hospitalization: None Spiritual Requests During Hospitalization: None   Advance Directives (For Healthcare) Advance Directive: Patient does not have advance directive Nutrition Screen- [facility] Adult/[facility] Patient's home diet: Regular  Additional Information 1:1 In Past 12 Months?: Yes CIRT Risk: No Elopement Risk: No Does patient have medical clearance?: No     Disposition:  Disposition Initial Assessment Completed for this Encounter: Yes Disposition of Patient: Referred to;Outpatient treatment (CD[facility]IOP at [facility] ) Type of inpatient treatment program: Adult Patient referred to: Other (Comment) (CD-IOP at [facility] starts 10am 5/1 & 5/2 starts group)  On Site Evaluation by:   Reviewed with Physician:     Melynda Ripple [facility] 08/15/2012 11:49 AM

## 2012-08-15 NOTE — ED Provider Notes (Signed)
History     CSN: [id number]  Arrival date & time 08/15/12  0027   First MD Initiated Contact with Patient 08/15/12 0345      Chief Complaint  Patient presents with  .  Polysubstance Abuse     (Consider location/radiation/quality/duration/timing/severity/associated sxs/prior treatment) HPI This is a 62 year old female with a long-standing history of polysubstance abuse. She has had periods of sobriety in the past. She has been abusing cocaine, alcohol and tranquilizers. She was with some friends last night who were drinking and she became depressed over the state of her life. She decided that she wanted to make another attempt at sobriety. She denies suicidal or homicidal ideation. She denies withdrawal symptoms at this time. Her symptoms are moderate. She denies tremulousness, nausea, vomiting, diarrhea, abdominal pain, lightheadedness or dizziness.   Past Medical History  Diagnosis Date  . Seizures   . Gastric ulcer   . MRSA (methicillin resistant staph aureus) culture positive   . Interstitial cystitis   . Depression   . Mood disorder     Past Surgical History  Procedure Laterality Date  . Shoulder arthroscopy    . Rotator cuff repair      R side    History reviewed. No pertinent family history.  History  Substance Use Topics  . Smoking status: Current Every Day Smoker  . Smokeless tobacco: Not on file  . Alcohol Use: No    OB History   Grav Para Term Preterm Abortions TAB SAB Ect Mult Living                  Review of Systems  All other systems reviewed and are negative.    Allergies  Haloperidol decanoate  Home Medications   Current Outpatient Rx  Name  Route  Sig  Dispense  Refill  . amitriptyline (ELAVIL) 100 MG tablet   Oral   Take 100 mg by mouth at bedtime as needed. sleep         . clonazePAM (KLONOPIN) 0.5 MG tablet   Oral   Take 0.5 mg by mouth 2 (two) times daily as needed. For anxiety         . ibuprofen (ADVIL,MOTRIN) 200 MG  tablet   Oral   Take 400 mg by mouth every 6 (six) hours as needed. Pain         . lamoTRIgine (LAMICTAL) 100 MG tablet      take 1 tablet by mouth every morning and 2 tablets every evening   90 tablet   5   . Multiple Vitamin (MULTIVITAMIN WITH MINERALS) TABS   Oral   Take 1 tablet by mouth daily.         . risperiDONE (RISPERDAL) 1 MG tablet   Oral   Take 2 mg by mouth 2 (two) times daily.         . simvastatin (ZOCOR) 20 MG tablet   Oral   Take 20 mg by mouth every evening.         . traMADol (ULTRAM) 50 MG tablet   Oral   Take 50 mg by mouth every 6 (six) hours as needed. For pain.           BP 93/64  Pulse 86  Temp(Src) 98.1 F (36.7 C) (Oral)  SpO2 94%  Physical Exam General: Well-developed, well-nourished female in no acute distress; appearance consistent with age of record HENT: normocephalic, atraumatic Eyes: pupils equal round and reactive to light; extraocular muscles intact Neck: supple  Heart: regular rate and rhythm Lungs: clear to auscultation bilaterally Abdomen: soft; nondistended; nontender; no masses or hepatosplenomegaly; bowel sounds present Extremities: No deformity; full range of motion Neurologic: Awake, alert and oriented; motor function intact in all extremities and symmetric; no facial droop; tremulous Skin: Warm and dry Psychiatric: Depressed mood with congruent affect    ED Course  Procedures (including critical care time)     MDM   Nursing notes and vitals signs, including pulse oximetry, reviewed.  Summary of this visit's results, reviewed by myself:  Labs:  Results for orders placed during the hospital encounter of 08/15/12 (from the past 24 hour(s))  URINALYSIS, ROUTINE W REFLEX MICROSCOPIC     Status: None   Collection Time    08/15/12  2:17 AM      Result Value Range   Color, Urine YELLOW  YELLOW   APPearance CLEAR  CLEAR   Specific Gravity, Urine 1.012  1.005 - 1.030   pH 5.0  5.0 - 8.0   Glucose, UA  NEGATIVE  NEGATIVE mg/dL   Hgb urine dipstick NEGATIVE  NEGATIVE   Bilirubin Urine NEGATIVE  NEGATIVE   Ketones, ur NEGATIVE  NEGATIVE mg/dL   Protein, ur NEGATIVE  NEGATIVE mg/dL   Urobilinogen, UA 0.2  0.0 - 1.0 mg/dL   Nitrite NEGATIVE  NEGATIVE   Leukocytes, UA NEGATIVE  NEGATIVE  CBC WITH DIFFERENTIAL     Status: None   Collection Time    08/15/12  2:20 AM      Result Value Range   WBC 7.8  4.0 - 10.5 K/uL   RBC 4.03  3.87 - 5.11 MIL/uL   Hemoglobin 12.7  12.0 - 15.0 g/dL   HCT 30.8  65.7 - 84.6 %   MCV 90.6  78.0 - 100.0 fL   MCH 31.5  26.0 - 34.0 pg   MCHC 34.8  30.0 - 36.0 g/dL   RDW 96.2  95.2 - 84.1 %   Platelets 236  150 - 400 K/uL   Neutrophils Relative 60  43 - 77 %   Neutro Abs 4.7  1.7 - 7.7 K/uL   Lymphocytes Relative 33  12 - 46 %   Lymphs Abs 2.5  0.7 - 4.0 K/uL   Monocytes Relative 4  3 - 12 %   Monocytes Absolute 0.3  0.1 - 1.0 K/uL   Eosinophils Relative 3  0 - 5 %   Eosinophils Absolute 0.3  0.0 - 0.7 K/uL   Basophils Relative 0  0 - 1 %   Basophils Absolute 0.0  0.0 - 0.1 K/uL  COMPREHENSIVE METABOLIC PANEL     Status: Abnormal   Collection Time    08/15/12  2:20 AM      Result Value Range   Sodium 136  135 - 145 mEq/L   Potassium 3.2 (*) 3.5 - 5.1 mEq/L   Chloride 99  96 - 112 mEq/L   CO2 26  19 - 32 mEq/L   Glucose, Bld 55 (*) 70 - 99 mg/dL   BUN 16  6 - 23 mg/dL   Creatinine, Ser 3.24  0.50 - 1.10 mg/dL   Calcium 9.4  8.4 - 40.1 mg/dL   Total Protein 6.3  6.0 - 8.3 g/dL   Albumin 3.5  3.5 - 5.2 g/dL   AST 16  0 - 37 U/L   ALT 14  0 - 35 U/L   Alkaline Phosphatase 70  39 - 117 U/L   Total Bilirubin 0.3  0.3 - 1.2 mg/dL   GFR calc non Af Amer 58 (*) >90 mL/min   GFR calc Af Amer 67 (*) >90 mL/min  ACETAMINOPHEN LEVEL     Status: None   Collection Time    08/15/12  2:20 AM      Result Value Range   Acetaminophen (Tylenol), Serum <15.0  10 - 30 ug/mL  ETHANOL     Status: Abnormal   Collection Time    08/15/12  2:20 AM      Result Value  Range   Alcohol, Ethyl (B) 15 (*) 0 - 11 mg/dL           Hanley Seamen, MD 08/15/12 [phone]

## 2012-08-15 NOTE — [facility] Suicide Risk Assessment (Signed)
Suicide Risk Assessment  Discharge Assessment     Demographic Factors:  Adolescent or young adult, Caucasian, Low socioeconomic status, Living alone and Unemployed  Mental Status Per Nursing Assessment::   On Admission:     Current Mental Status by Physician: NA  Loss Factors: Financial problems/change in socioeconomic status  Historical Factors: Impulsivity  Risk Reduction Factors:   Sense of responsibility to family, Religious beliefs about death, Positive social support and Positive therapeutic relationship  Continued Clinical Symptoms:  Depression:   Comorbid alcohol abuse/dependence Impulsivity Insomnia Recent sense of peace/wellbeing Severe Alcohol/Substance Abuse/Dependencies Previous Psychiatric Diagnoses and Treatments Medical Diagnoses and Treatments/Surgeries  Cognitive Features That Contribute To Risk:  Closed-mindedness Polarized thinking    Suicide Risk:  Minimal: No identifiable suicidal ideation.  Patients presenting with no risk factors but with morbid ruminations; may be classified as minimal risk based on the severity of the depressive symptoms  Discharge Diagnoses:   AXIS I:  Alcohol Abuse and Substance Induced Mood Disorder AXIS II:  Borderline Personality Dis. AXIS III:   Past Medical History  Diagnosis Date  . Seizures   . Gastric ulcer   . MRSA (methicillin resistant staph aureus) culture positive   . Interstitial cystitis   . Depression   . Mood disorder    AXIS IV:  economic problems, occupational problems, other psychosocial or environmental problems, problems related to social environment and problems with primary support group AXIS V:  41-50 serious symptoms  Plan Of Care/Follow-up recommendations:  Activity:  as tolerated Diet:  regular  Is patient on multiple antipsychotic therapies at discharge:  No   Has Patient had three or more failed trials of antipsychotic monotherapy by history:  No  Recommended Plan for Multiple  Antipsychotic Therapies: Not applicable  Myan Locatelli,JANARDHAHA R. 08/15/2012, 12:23 PM

## 2012-08-15 NOTE — Discharge Instructions (Signed)
 Date Medication as prescribed and [facility]

## 2012-08-15 NOTE — Consult Note (Signed)
Reason for Consult: cocaine intoxication and polysubstance abuse Referring Physician: Dr. Clayborn Bigness [name] is an 62 y.o. female.  HPI: Patient was seen and chart reviewed. Patient reported that she need treatment for polysubstance abuse especially crack cocaine, alcohol and narcotic prescription drugs. Patient stated she has been feeling tired off getting high and feels unhappy about lies to her family about getting high and also said that she is spending too much money on drugs even though she is on disability. Patient brother who lives in [place] has been helping her with her budget as per the Department of Social Security arranged. Patient has been in disagreemetnt with that arrangement. She has a 89 years old son in [place] but not seen or contacted over 4 years. The patient reportedly was adopted when she was 72 days old baby and her adopted mother (100) was in a friend's home which a nursing home. Patient has been taking medication management from a [facility]. Patient has a previous treatment in [facility] [place] [place] in [facility] for 28 days program and also [facility] in [place], [place] for 28 days program in 1995. Patient reported she has no substance abuse treatment over several years. 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 She has been seizure free over seven months.  Mental Status Examination: Patient appeared as per his stated age and fairly groomed, and maintaining good eye contact. Patient has anxious mood and his affect was constricted. He has normal rate, rhythm, and volume of speech. His thought process is linear and goal directed. Patient has denied suicidal, homicidal ideations, intentions or plans. Patient has no evidence of auditory or visual hallucinations, delusions, and paranoia. Patient has fair to poor insight judgment and impulse control.  Past Medical History  Diagnosis Date  . Seizures   .  Gastric ulcer   . MRSA (methicillin resistant staph aureus) culture positive   . Interstitial cystitis   . Depression   . Mood disorder     Past Surgical History  Procedure Laterality Date  . Shoulder arthroscopy    . Rotator cuff repair      R side    History reviewed. No pertinent family history.  Social History:  reports that she has been smoking.  She does not have any smokeless tobacco history on file. She reports that she uses illicit drugs (Cocaine). She reports that she does not drink alcohol.  Allergies:  Allergies  Allergen Reactions  . Haloperidol Decanoate Anaphylaxis    Medications: I have reviewed the patient's current medications.  Results for orders placed during the hospital encounter of 08/15/12 (from the past 48 hour(s))  URINALYSIS, ROUTINE W REFLEX MICROSCOPIC     Status: None   Collection Time    08/15/12  2:17 AM      Result Value Range   Color, Urine YELLOW  YELLOW   APPearance CLEAR  CLEAR   Specific Gravity, Urine 1.012  1.005 - 1.030   pH 5.0  5.0 - 8.0   Glucose, UA NEGATIVE  NEGATIVE mg/dL   Hgb urine dipstick NEGATIVE  NEGATIVE   Bilirubin Urine NEGATIVE  NEGATIVE   Ketones, ur NEGATIVE  NEGATIVE mg/dL   Protein, ur NEGATIVE  NEGATIVE mg/dL   Urobilinogen, UA 0.2  0.0 - 1.0 mg/dL   Nitrite NEGATIVE  NEGATIVE   Leukocytes, UA NEGATIVE  NEGATIVE   Comment: MICROSCOPIC NOT DONE ON URINES WITH NEGATIVE PROTEIN, BLOOD, LEUKOCYTES,  NITRITE, OR GLUCOSE <1000 mg/dL.  URINE RAPID DRUG SCREEN (HOSP PERFORMED)     Status: Abnormal   Collection Time    08/15/12  2:17 AM      Result Value Range   Opiates NONE DETECTED  NONE DETECTED   Cocaine POSITIVE (*) NONE DETECTED   Benzodiazepines NONE DETECTED  NONE DETECTED   Amphetamines NONE DETECTED  NONE DETECTED   Tetrahydrocannabinol NONE DETECTED  NONE DETECTED   Barbiturates NONE DETECTED  NONE DETECTED   Comment:            DRUG SCREEN FOR MEDICAL PURPOSES     ONLY.  IF CONFIRMATION IS NEEDED      FOR ANY PURPOSE, NOTIFY LAB     WITHIN 5 DAYS.                LOWEST DETECTABLE LIMITS     FOR URINE DRUG SCREEN     Drug Class       Cutoff (ng/mL)     Amphetamine      1000     Barbiturate      200     Benzodiazepine   200     Tricyclics       300     Opiates          300     Cocaine          300     THC              50  CBC WITH DIFFERENTIAL     Status: None   Collection Time    08/15/12  2:20 AM      Result Value Range   WBC 7.8  4.0 - 10.5 K/uL   RBC 4.03  3.87 - 5.11 MIL/uL   Hemoglobin 12.7  12.0 - 15.0 g/dL   HCT 16.1  09.6 - 04.5 %   MCV 90.6  78.0 - 100.0 fL   MCH 31.5  26.0 - 34.0 pg   MCHC 34.8  30.0 - 36.0 g/dL   RDW 40.9  81.1 - 91.4 %   Platelets 236  150 - 400 K/uL   Neutrophils Relative 60  43 - 77 %   Neutro Abs 4.7  1.7 - 7.7 K/uL   Lymphocytes Relative 33  12 - 46 %   Lymphs Abs 2.5  0.7 - 4.0 K/uL   Monocytes Relative 4  3 - 12 %   Monocytes Absolute 0.3  0.1 - 1.0 K/uL   Eosinophils Relative 3  0 - 5 %   Eosinophils Absolute 0.3  0.0 - 0.7 K/uL   Basophils Relative 0  0 - 1 %   Basophils Absolute 0.0  0.0 - 0.1 K/uL  COMPREHENSIVE METABOLIC PANEL     Status: Abnormal   Collection Time    08/15/12  2:20 AM      Result Value Range   Sodium 136  135 - 145 mEq/L   Potassium 3.2 (*) 3.5 - 5.1 mEq/L   Chloride 99  96 - 112 mEq/L   CO2 26  19 - 32 mEq/L   Glucose, Bld 55 (*) 70 - 99 mg/dL   BUN 16  6 - 23 mg/dL   Creatinine, Ser 7.82  0.50 - 1.10 mg/dL   Calcium 9.4  8.4 - 95.6 mg/dL   Total Protein 6.3  6.0 - 8.3 g/dL   Albumin 3.5  3.5 - 5.2 g/dL   AST 16  0 -  37 U/L   ALT 14  0 - 35 U/L   Alkaline Phosphatase 70  39 - 117 U/L   Total Bilirubin 0.3  0.3 - 1.2 mg/dL   GFR calc non Af Amer 58 (*) >90 mL/min   GFR calc Af Amer 67 (*) >90 mL/min   Comment:            The eGFR has been calculated     using the CKD EPI equation.     This calculation has not been     validated in all clinical     situations.     eGFR's persistently     <90  mL/min signify     possible Chronic Kidney Disease.  ACETAMINOPHEN LEVEL     Status: None   Collection Time    08/15/12  2:20 AM      Result Value Range   Acetaminophen (Tylenol), Serum <15.0  10 - 30 ug/mL   Comment:            THERAPEUTIC CONCENTRATIONS VARY     SIGNIFICANTLY. A RANGE OF 10-30     ug/mL MAY BE AN EFFECTIVE     CONCENTRATION FOR MANY PATIENTS.     HOWEVER, SOME ARE BEST TREATED     AT CONCENTRATIONS OUTSIDE THIS     RANGE.     ACETAMINOPHEN CONCENTRATIONS     >150 ug/mL AT 4 HOURS AFTER     INGESTION AND >50 ug/mL AT 12     HOURS AFTER INGESTION ARE     OFTEN ASSOCIATED WITH TOXIC     REACTIONS.  ETHANOL     Status: Abnormal   Collection Time    08/15/12  2:20 AM      Result Value Range   Alcohol, Ethyl (B) 15 (*) 0 - 11 mg/dL   Comment:            LOWEST DETECTABLE LIMIT FOR     SERUM ALCOHOL IS 11 mg/dL     FOR MEDICAL PURPOSES ONLY    No results found.  Positive for bad mood, behavior problems, borderline personality disorder, excessive alcohol consumption, illegal drug usage, mood swings and sleep disturbance Blood pressure 108/71, pulse 72, temperature 98.2 F (36.8 C), temperature source Oral, resp. rate 16, SpO2 100.00%.   Assessment/Plan: Polysubstance abuse Cocaine intoxication   Recommendation: Referred to [facility] at [facility] and has made scheduled intake tomorrow morning and start treatment on Friday. No medication changes in [facility].  Truman Aceituno,JANARDHAHA R. 08/15/2012, 10:58 AM

## 2012-08-15 NOTE — ED Notes (Signed)
Pt changed into scrubs.  Seen by security.  3 bags of belongings.

## 2012-11-07 IMAGING — CR DG RIBS W/ CHEST 3+V*L*
4 series · 4 of 4 positions shown · non-contrast
Comparison: 05/26/2009.

CLINICAL DATA: Chest pain.  Left lower rib pain after fall.  Prior
rib fractures.

LEFT RIBS AND CHEST - 3+ VIEW

[w chest pa]
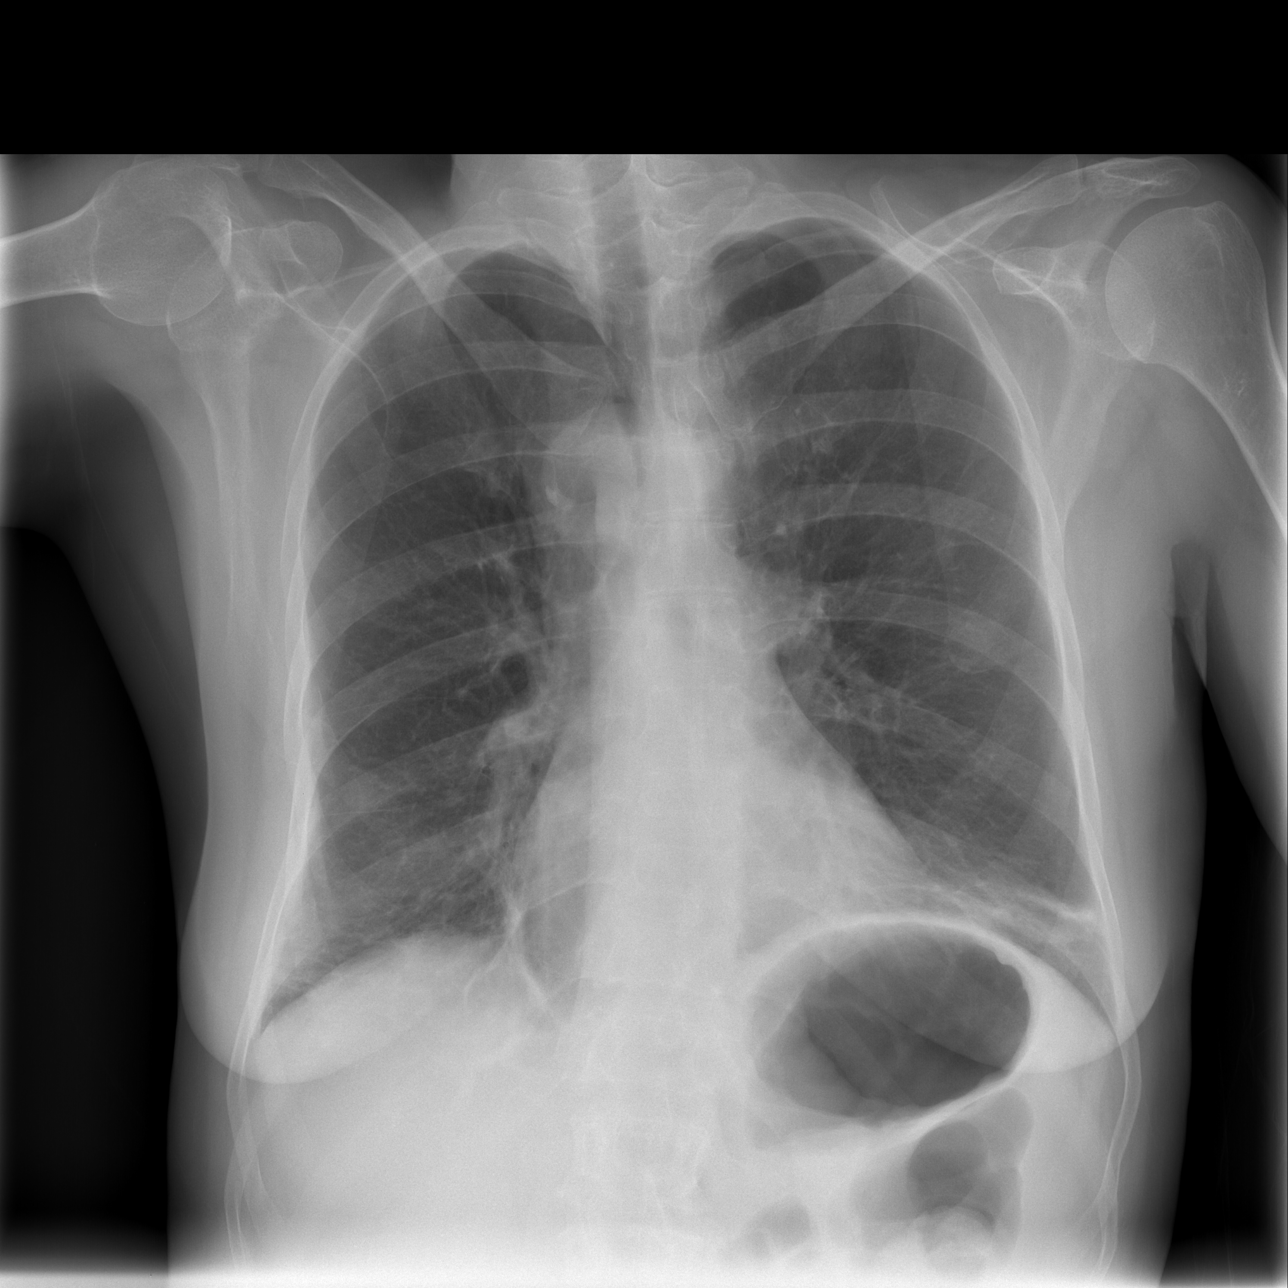

[w ribs ap/pa upper left *]
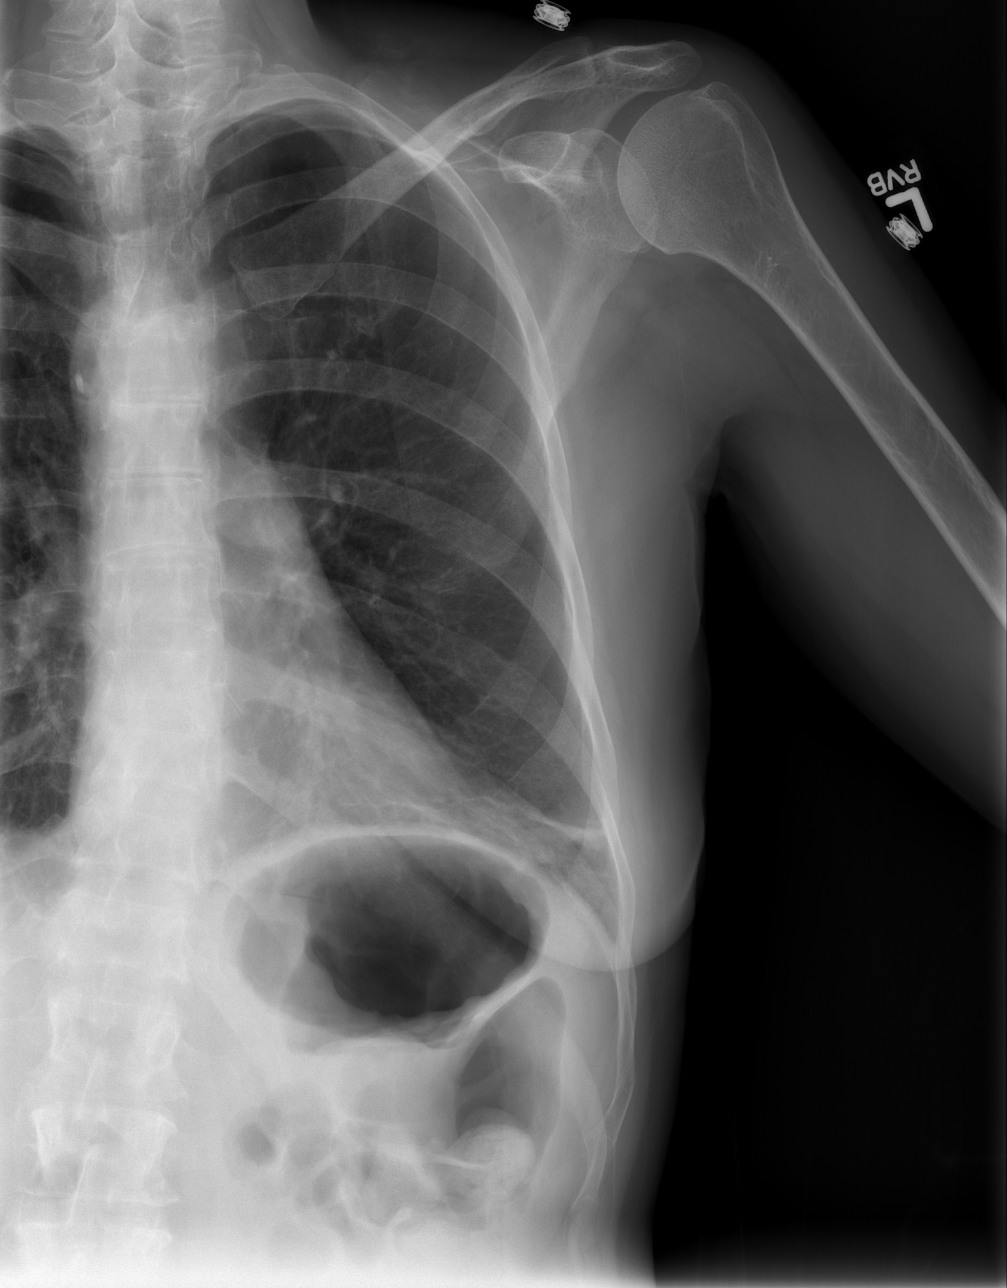

[w ribs ap/pa lower left *]
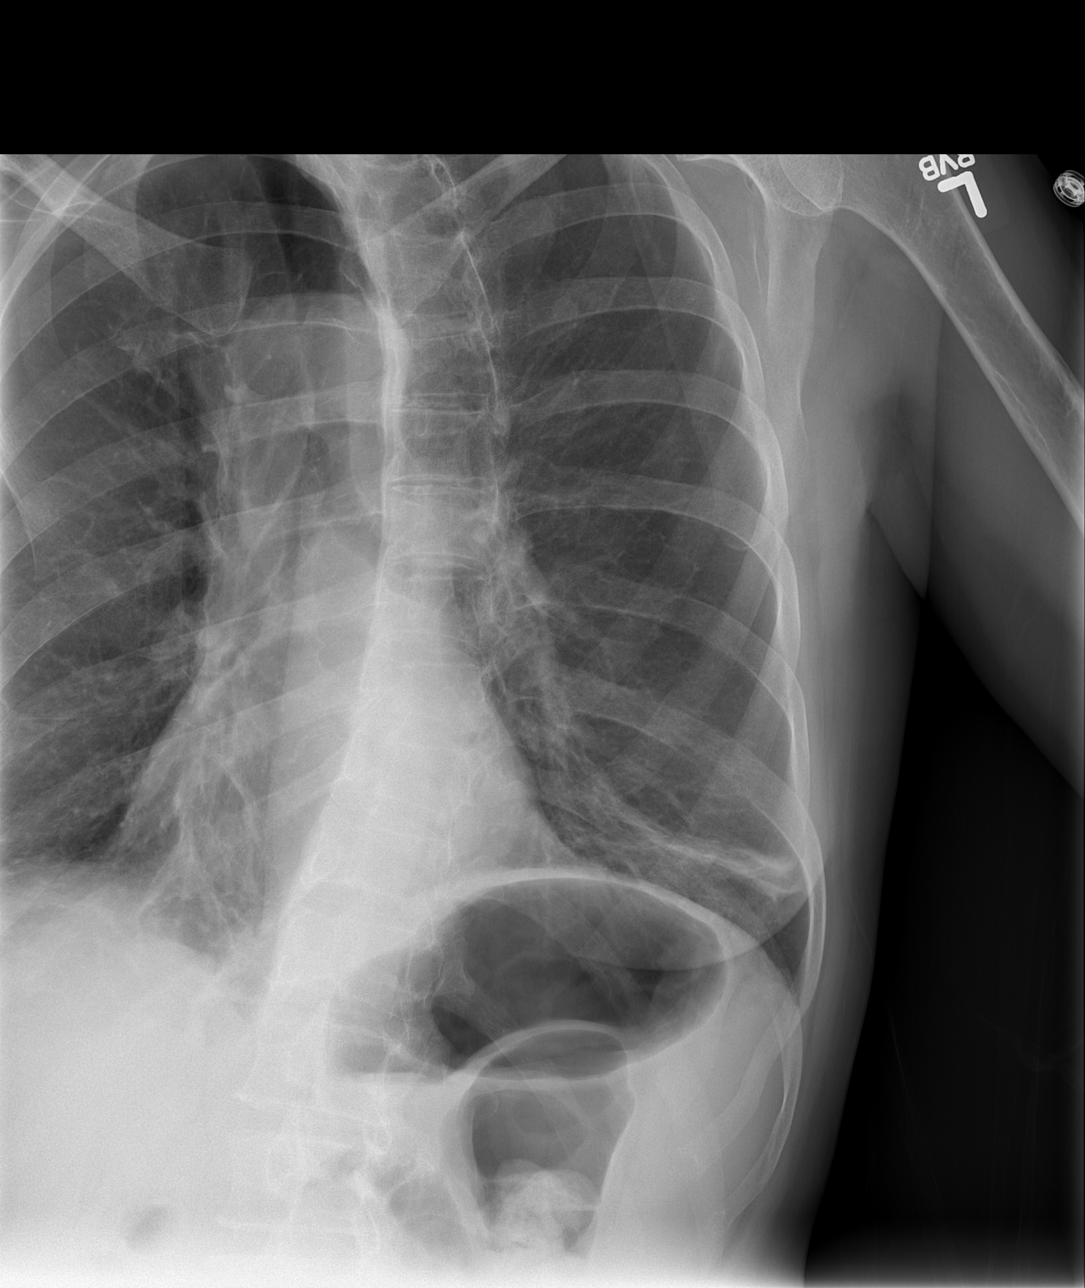

[w ribs oblique left *]
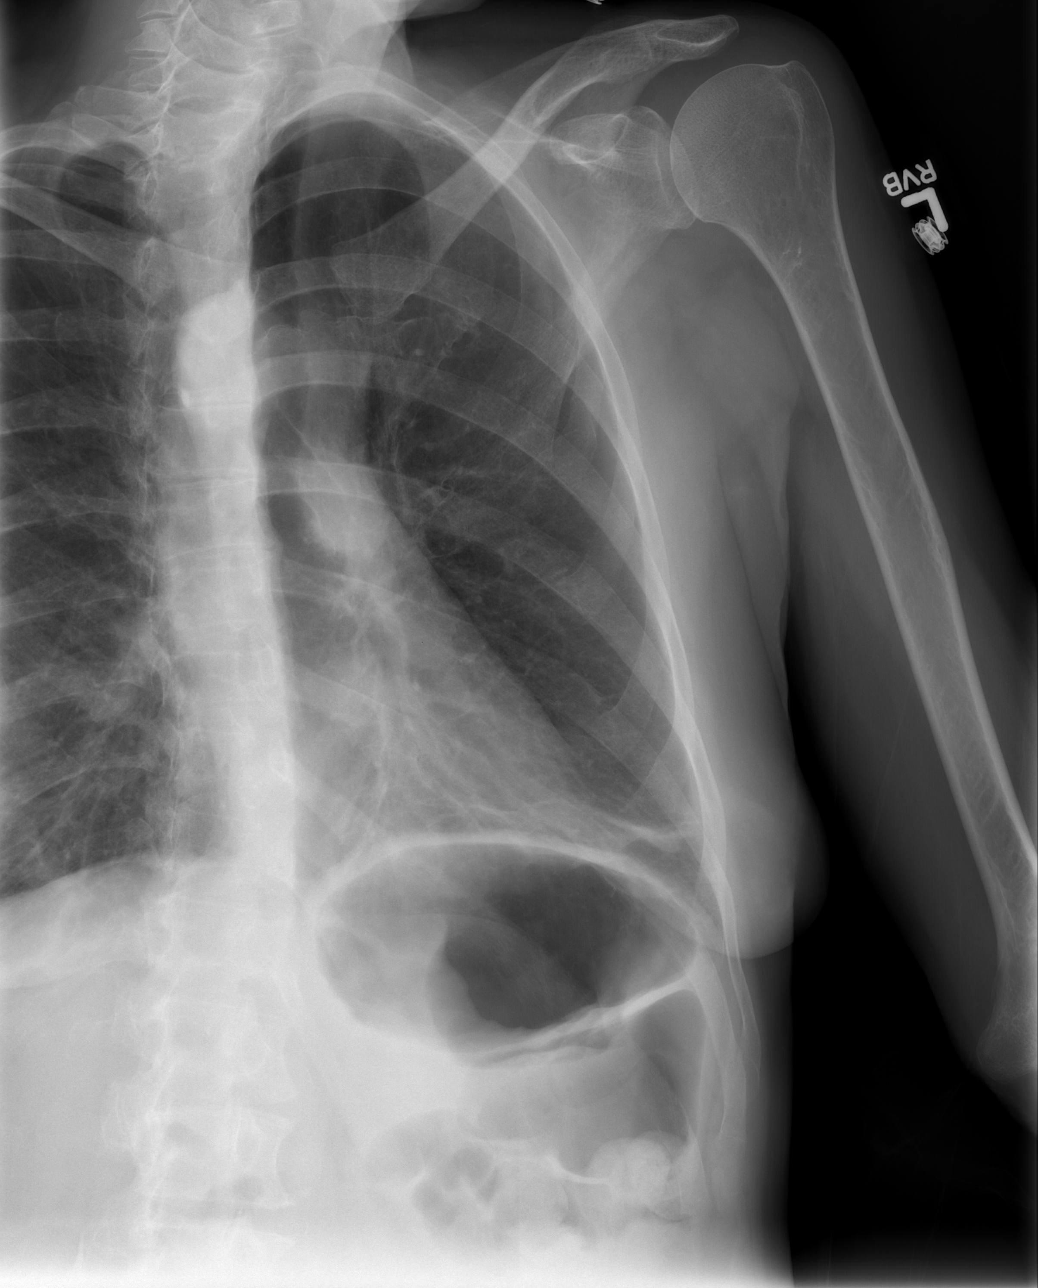

[4 of 4 positions shown; findings below may reference images not displayed]

FINDINGS: Healed right-sided rib fractures are present.  No acute
left-sided rib fractures are identified.  No pneumothorax.
Emphysema.  Left greater than right basilar scarring and / or
atelectasis.  Please note that plain film rib series have limited
sensitivity for nondisplaced rib fractures.  If the patient
continues to have pain, consider repeat examination in 1-2 weeks
with marker over area of maximal tenderness.  Often periosteal
reaction will be present at the site of occult rib fracture.
IMPRESSION: No acute abnormality.  Emphysema and bilateral basilar scarring and
/ or atelectasis.  Healed right rib fractures.

## 2012-11-27 ENCOUNTER — Emergency Department (HOSPITAL_COMMUNITY)
Admission: EM | Admit: 2012-11-27 | Discharge: 2012-11-27 | Disposition: A | Discharge status: Home / Self Care | Payer: PRIVATE HEALTH INSURANCE | Attending: Emergency Medicine | Admitting: Emergency Medicine

## 2012-11-27 ENCOUNTER — Encounter (HOSPITAL_COMMUNITY): Payer: Self-pay | Admitting: *Deleted

## 2012-11-27 DIAGNOSIS — Z79899 Other long term (current) drug therapy: Secondary | ICD-10-CM | POA: Insufficient documentation

## 2012-11-27 DIAGNOSIS — F3289 Other specified depressive episodes: Secondary | ICD-10-CM | POA: Insufficient documentation

## 2012-11-27 DIAGNOSIS — Z8742 Personal history of other diseases of the female genital tract: Secondary | ICD-10-CM | POA: Insufficient documentation

## 2012-11-27 DIAGNOSIS — Z8711 Personal history of peptic ulcer disease: Secondary | ICD-10-CM | POA: Insufficient documentation

## 2012-11-27 DIAGNOSIS — W503XXA Accidental bite by another person, initial encounter: Secondary | ICD-10-CM | POA: Insufficient documentation

## 2012-11-27 DIAGNOSIS — F329 Major depressive disorder, single episode, unspecified: Secondary | ICD-10-CM | POA: Insufficient documentation

## 2012-11-27 DIAGNOSIS — S99929A Unspecified injury of unspecified foot, initial encounter: Secondary | ICD-10-CM | POA: Insufficient documentation

## 2012-11-27 DIAGNOSIS — S01502A Unspecified open wound of oral cavity, initial encounter: Secondary | ICD-10-CM | POA: Insufficient documentation

## 2012-11-27 DIAGNOSIS — F39 Unspecified mood [affective] disorder: Secondary | ICD-10-CM | POA: Insufficient documentation

## 2012-11-27 DIAGNOSIS — S8990XA Unspecified injury of unspecified lower leg, initial encounter: Secondary | ICD-10-CM | POA: Insufficient documentation

## 2012-11-27 DIAGNOSIS — S6990XA Unspecified injury of unspecified wrist, hand and finger(s), initial encounter: Secondary | ICD-10-CM | POA: Insufficient documentation

## 2012-11-27 DIAGNOSIS — Y929 Unspecified place or not applicable: Secondary | ICD-10-CM | POA: Insufficient documentation

## 2012-11-27 DIAGNOSIS — S01512A Laceration without foreign body of oral cavity, initial encounter: Secondary | ICD-10-CM

## 2012-11-27 DIAGNOSIS — F17200 Nicotine dependence, unspecified, uncomplicated: Secondary | ICD-10-CM | POA: Insufficient documentation

## 2012-11-27 DIAGNOSIS — Y9389 Activity, other specified: Secondary | ICD-10-CM | POA: Insufficient documentation

## 2012-11-27 MED ORDER — MAGIC MOUTHWASH W/LIDOCAINE: 5.0000 mL | Freq: Three times a day (TID) | ORAL | Status: DC | PRN

## 2012-11-27 MED ORDER — HYDROCODONE-ACETAMINOPHEN 7.5-325 MG/15ML PO SOLN
10.0000 mL | Freq: Once | ORAL | Status: AC
  Administered 2012-11-27: 10 mL via ORAL
  Filled 2012-11-27: qty 15

## 2012-11-27 MED ORDER — MAGIC MOUTHWASH
5.0000 mL | Freq: Once | ORAL | Status: AC
  Administered 2012-11-27: 5 mL via ORAL
  Filled 2012-11-27: qty 5

## 2012-11-27 MED ORDER — HYDROCODONE-ACETAMINOPHEN 7.5-325 MG/15ML PO SOLN: 15.0000 mL | Freq: Four times a day (QID) | ORAL | Status: DC | PRN

## 2012-11-27 NOTE — ED Notes (Signed)
Pt has a ride home.  

## 2012-11-27 NOTE — ED Provider Notes (Signed)
CSN: [id number]     Arrival date & time 11/27/12  2021 History    This chart was scribed for a non-physician practitioner, Fayrene Helper, PA-C, working with Claudean Kinds, MD by Frederik Pear, ED Scribe. This patient was seen in room WTR7/WTR7 and the patient's care was started at 2108.   First MD Initiated Contact with Patient 11/27/12 2108     Chief Complaint  Patient presents with  . Mouth Injury   (Consider location/radiation/quality/duration/timing/severity/associated sxs/prior Treatment) The history is provided by the patient and medical records. No language interpreter was used.    HPI Comments: [name] is a 62 y.o. female with a h/o of seizures and ulcers who presents to the Emergency Department complaining of a mouth injury that occurred 2-3 days after she had a breakthrough seizure during which she bit her tongue. She states she takes Lamictal as prescribed by her neurologist Dr. Marjory Lies. She was not evaluated  following the seizure. In the ED, she complains of constant, severe swelling to her upper and lower lips and right-sided mouth pain with swelling that is aggravated with drinking and eating and alleviated by nothing. She has treated the pain at home with Anbesol and 600 mg of ibuprofen every 3 hours without relief. She reports decreased food and fluid intake over the past 2 days due to the pain. Allergic to haldol  Past Medical History  Diagnosis Date  . Seizures   . Gastric ulcer   . MRSA (methicillin resistant staph aureus) culture positive   . Interstitial cystitis   . Depression   . Mood disorder    Past Surgical History  Procedure Laterality Date  . Shoulder arthroscopy    . Rotator cuff repair      R side   No family history on file. History  Substance Use Topics  . Smoking status: Current Every Day Smoker  . Smokeless tobacco: Not on file  . Alcohol Use: No   OB History   Grav Para Term Preterm Abortions TAB SAB Ect Mult Living                  Review of Systems  HENT: Positive for facial swelling.        Mouth pain  All other systems reviewed and are negative.   Allergies  Haloperidol decanoate  Home Medications   Current Outpatient Rx  Name  Route  Sig  Dispense  Refill  . amitriptyline (ELAVIL) 100 MG tablet   Oral   Take 100 mg by mouth at bedtime as needed for sleep.          . clonazePAM (KLONOPIN) 0.5 MG tablet   Oral   Take 0.5 mg by mouth 2 (two) times daily as needed for anxiety.          Marland Kitchen ibuprofen (ADVIL,MOTRIN) 200 MG tablet   Oral   Take 600 mg by mouth every 2 (two) hours as needed for pain.          Marland Kitchen lamoTRIgine (LAMICTAL) 100 MG tablet   Oral   Take 100-200 mg by mouth 2 (two) times daily. 1 tab in am, 2 tabs in pm         . Multiple Vitamin (MULTIVITAMIN WITH MINERALS) TABS   Oral   Take 1 tablet by mouth daily.         . risperiDONE (RISPERDAL) 1 MG tablet   Oral   Take 2 mg by mouth 2 (two) times daily.         Marland Kitchen  simvastatin (ZOCOR) 20 MG tablet   Oral   Take 20 mg by mouth every evening.         . traMADol (ULTRAM) 50 MG tablet   Oral   Take 50 mg by mouth every 6 (six) hours as needed for pain.           BP 111/77  Pulse 85  Temp(Src) 99.7 F (37.6 C) (Oral)  Resp 16  Ht 5\' 6"  (1.676 m)  Wt 150 lb (68.04 kg)  BMI 24.22 kg/m2  SpO2 95% Physical Exam  Nursing note and vitals reviewed. Constitutional: She is oriented to person, place, and time. She appears well-developed and well-nourished. No distress.  HENT:  Head: Normocephalic and atraumatic.  Bite marks on lateral aspect of left side of tongue. No deep lacerations. Several bite marks on the mucus mucosal on the right side of mouth.   Eyes: EOM are normal.  Neck: Neck supple. No tracheal deviation present.  Cardiovascular: Normal rate.   Pulmonary/Chest: Effort normal. No respiratory distress.  Musculoskeletal: Normal range of motion.  Neurological: She is alert and oriented to person, place, and  time.  Skin: Skin is warm and dry.  Psychiatric: She has a normal mood and affect. Her behavior is normal.    ED Course   Procedures (including critical care time)  DIAGNOSTIC STUDIES: Oxygen Saturation is 95% on room air, adequate by my interpretation.    COORDINATION OF CARE:  21:23- Discussed planned course of treatment in the ED with the patient, including magic mouthwash and Hycet, who is agreeable at this time. Will discharge the pt with a prescription for magic mouthwash. She is agreeable at this time.  Labs Reviewed - No data to display No results found. 1. Laceration of tongue without complication, initial encounter     MDM  BP 111/77  Pulse 85  Temp(Src) 99.7 F (37.6 C) (Oral)  Resp 16  Ht 5\' 6"  (1.676 m)  Wt 150 lb (68.04 kg)  BMI 24.22 kg/m2  SpO2 95%  I personally performed the services described in this documentation, which was scribed in my presence. The recorded information has been reviewed and is accurate.     Fayrene Helper, PA-C 11/27/12 2132

## 2012-11-27 NOTE — ED Notes (Signed)
Pt reports that she had a seizure 2-3 days ago and bit her tongue; pt reports that her mouth is sore and she has been unable to eat due to discomfort.

## 2012-11-27 NOTE — Discharge Instructions (Signed)
Mouth Injury  Cuts and scrapes inside the mouth are common from falls or bites. They tend to bleed a lot. Most mouth injuries heal quickly.   HOME CARE   See your dentist right away if teeth are broken. Take all broken pieces with you to the dentist.   Press on the bleeding site with a germ free (sterile) gauze or piece of clean cloth. This will help stop the bleeding.   Cold drinks or ice will help keep the puffiness (swelling) down.   Gargle with warm salt water after 1 day. Put 1 teaspoon of salt into 1 cup of warm water.   Only take medicine as told by your doctor.   Eat soft foods until healing is complete.   Avoid any salty or citrus foods. They may sting your mouth.   Rinse your mouth with warm water after meals.  GET HELP RIGHT AWAY IF:    You have a large amount of bleeding that will not stop.   You have severe pain.   You have trouble swallowing.   Your mouth becomes infected.   You have a fever.  MAKE SURE YOU:    Understand these instructions.   Will watch your condition.   Will get help right away if you are not doing well or get worse.  Document Released: 06/29/2009 Document Revised: 06/27/2011 Document Reviewed: 06/29/2009  [employer] Patient Information 2014 [employer].

## 2012-11-30 ENCOUNTER — Emergency Department (HOSPITAL_COMMUNITY)
Admission: EM | Admit: 2012-11-30 | Discharge: 2012-11-30 | Disposition: A | Discharge status: Home / Self Care | Payer: PRIVATE HEALTH INSURANCE | Attending: Emergency Medicine | Admitting: Emergency Medicine

## 2012-11-30 DIAGNOSIS — F3289 Other specified depressive episodes: Secondary | ICD-10-CM | POA: Insufficient documentation

## 2012-11-30 DIAGNOSIS — Z79899 Other long term (current) drug therapy: Secondary | ICD-10-CM | POA: Insufficient documentation

## 2012-11-30 DIAGNOSIS — S199XXA Unspecified injury of neck, initial encounter: Secondary | ICD-10-CM | POA: Insufficient documentation

## 2012-11-30 DIAGNOSIS — F17200 Nicotine dependence, unspecified, uncomplicated: Secondary | ICD-10-CM | POA: Insufficient documentation

## 2012-11-30 DIAGNOSIS — Z8614 Personal history of Methicillin resistant Staphylococcus aureus infection: Secondary | ICD-10-CM | POA: Insufficient documentation

## 2012-11-30 DIAGNOSIS — Z8719 Personal history of other diseases of the digestive system: Secondary | ICD-10-CM | POA: Insufficient documentation

## 2012-11-30 DIAGNOSIS — W503XXA Accidental bite by another person, initial encounter: Secondary | ICD-10-CM | POA: Insufficient documentation

## 2012-11-30 DIAGNOSIS — Y9389 Activity, other specified: Secondary | ICD-10-CM | POA: Insufficient documentation

## 2012-11-30 DIAGNOSIS — Y929 Unspecified place or not applicable: Secondary | ICD-10-CM | POA: Insufficient documentation

## 2012-11-30 DIAGNOSIS — F329 Major depressive disorder, single episode, unspecified: Secondary | ICD-10-CM | POA: Insufficient documentation

## 2012-11-30 DIAGNOSIS — Z8742 Personal history of other diseases of the female genital tract: Secondary | ICD-10-CM | POA: Insufficient documentation

## 2012-11-30 DIAGNOSIS — G40909 Epilepsy, unspecified, not intractable, without status epilepticus: Secondary | ICD-10-CM | POA: Insufficient documentation

## 2012-11-30 DIAGNOSIS — S0993XA Unspecified injury of face, initial encounter: Secondary | ICD-10-CM | POA: Insufficient documentation

## 2012-11-30 MED ORDER — HYDROCODONE-ACETAMINOPHEN 5-325 MG PO TABS: 1.0000 | ORAL_TABLET | ORAL | Status: DC | PRN

## 2012-11-30 MED ORDER — PENICILLIN V POTASSIUM 500 MG PO TABS: 500.0000 mg | ORAL_TABLET | Freq: Four times a day (QID) | ORAL | Status: AC

## 2012-11-30 NOTE — ED Notes (Signed)
Pt c/o injury to tongue after having a seizure about one week ago. Pt states she has pain on the R side of her tongue. Pt seen for same in ED last week. Pt states she ran out of magic mouthwash. Pt also states she is out of Hycet also. Pt states she wants some stronger pain medicine. Pt with no acute distress. Pt ambulatory to exam room with steady gait.

## 2012-11-30 NOTE — Discharge Instructions (Signed)
 Please followup with your doctor for a dental specialist for continued evaluation of your tongue or mouth injuries. Continue to use normal oral hygiene with regular brushing of your teeth and mouth wash.    Mouth Injury, Generic Cuts and scrapes inside the mouth are common from bites and falls. They often look much worse than they really are and tend to bleed a lot. Small cuts and scrapes inside the mouth usually heal in 3 or 4 days.  HOME CARE INSTRUCTIONS   If any of your teeth are broken, see your dentist. If baby teeth are knocked out, ask your dentist if further treatment is needed. If permanent teeth are knocked out, put them into a glass of cold milk until they can be immediately re-implanted. This should be done as soon as possible.  Cold drinks or popsicles will help keep swelling down and lessen discomfort.  After 1 day, gargle with warm salt water. Put  teaspoon (tsp) of salt into 8 ounces (oz) of warm water. Cuts in the mouth often look very grey or whitish and infected and that is because they are. The mouth is full of bacteria but injuries heal very well. Cuts that look quite bad cannot be noticed after a week or so.  For bleeding of the inner lip or tissue that connects it to the gum, press the bleeding site against the teeth or jaw for 10 minutes. Once bleeding from inside the lip stops, do not pull the lip out again or the bleeding will start again.  For bleeding from the tongue, squeeze or press the bleeding site with a sterile gauze or piece of clean cloth for 10 minutes.  Only take over-the-counter or prescription medicines for pain, discomfort, or fever as directed by your caregiver. Do not take aspirin  or you may bleed more.  Eat a soft diet until healing is complete.  Avoid any salty or citrus foods. They may sting your mouth.  Rinse the wound with warm water immediately after meals. SEEK MEDICAL CARE IF:  You have increasing pain or swelling. SEEK IMMEDIATE MEDICAL  CARE IF:   You have a large amount of bleeding that cannot be stopped.  You have minor bleeding that will not stop after 10 minutes of direct pressure.  You have severe pain.  You cannot swallow, or you start to drool.  You have a fever. MAKE SURE YOU:   Understand these instructions.  Will watch your condition.  Will get help right away if you are not doing well or get worse. Document Released: 11/20/2003 Document Revised: 06/27/2011 Document Reviewed: 08/09/2007 [facility] Patient Information 2014 [place], [place].

## 2012-11-30 NOTE — ED Provider Notes (Signed)
Medical screening examination/treatment/procedure(s) were performed by non-physician practitioner and as supervising physician I was immediately available for consultation/collaboration.   Vondra Aldredge Joseph Charlotta Lapaglia, MD 11/30/12 1106 

## 2012-11-30 NOTE — ED Provider Notes (Signed)
CSN: [id number]     Arrival date & time 11/30/12  1955 History  This chart was scribed for non-physician practitioner, Ivonne Andrew, PA-C working with Audree Camel, MD by Greggory Stallion, ED scribe. This patient was seen in room WTR5/WTR5 and the patient's care was started at 8:42 PM.   Chief Complaint  Patient presents with  . Tongue Injury    The history is provided by the patient. No language interpreter was used.    HPI Comments: [name] is a 62 y.o. female with h/o seizures who presents to the Emergency Department complaining of tongue injury that occurred about one week ago when she was having a seizure. She states she has pain on the right side of her tongue. Pt states eating worsens the pain. Pt was seen in the ED last week for the same symptoms and was given magic mouthwash with some relief but is now out of it. Pt states she was also given liquid Vicodin with some relief but is now out of it as well. She denies numbness, tingling, fever, chills and sweats as associated symptoms.   Past Medical History  Diagnosis Date  . Seizures   . Gastric ulcer   . MRSA (methicillin resistant staph aureus) culture positive   . Interstitial cystitis   . Depression   . Mood disorder    Past Surgical History  Procedure Laterality Date  . Shoulder arthroscopy    . Rotator cuff repair      R side   No family history on file. History  Substance Use Topics  . Smoking status: Current Every Day Smoker  . Smokeless tobacco: Not on file  . Alcohol Use: No   OB History   Grav Para Term Preterm Abortions TAB SAB Ect Mult Living                 Review of Systems  Constitutional: Negative for fever and chills.  HENT:       Tongue pain  Neurological: Negative for numbness.  All other systems reviewed and are negative.    Allergies  Haloperidol decanoate  Home Medications   Current Outpatient Rx  Name  Route  Sig  Dispense  Refill  . Alum & Mag Hydroxide-Simeth (MAGIC  MOUTHWASH W/LIDOCAINE) SOLN   Oral   Take 5 mLs by mouth 3 (three) times daily as needed.   120 mL   0     DIPHENHYDRAMINE HCL (ANTIHISTAMINES - ETHANOLAMINE .Marland Kitchen.   . amitriptyline (ELAVIL) 100 MG tablet   Oral   Take 100 mg by mouth at bedtime as needed for sleep.          . clonazePAM (KLONOPIN) 0.5 MG tablet   Oral   Take 0.5 mg by mouth 2 (two) times daily as needed for anxiety.          Marland Kitchen HYDROcodone-acetaminophen (HYCET) 7.5-325 mg/15 ml solution   Oral   Take 15 mLs by mouth 4 (four) times daily as needed for pain.   120 mL   0   . ibuprofen (ADVIL,MOTRIN) 200 MG tablet   Oral   Take 600 mg by mouth every 2 (two) hours as needed for pain.          Marland Kitchen lamoTRIgine (LAMICTAL) 100 MG tablet   Oral   Take 100-200 mg by mouth 2 (two) times daily. 1 tab in am, 2 tabs in pm         . Multiple Vitamin (MULTIVITAMIN WITH MINERALS) TABS  Oral   Take 1 tablet by mouth daily.         . risperiDONE (RISPERDAL) 1 MG tablet   Oral   Take 2 mg by mouth 2 (two) times daily.         . simvastatin (ZOCOR) 20 MG tablet   Oral   Take 20 mg by mouth every evening.          BP 108/83  Pulse 75  Temp(Src) 98.5 F (36.9 C) (Oral)  Resp 16  SpO2 96%  Physical Exam  Nursing note and vitals reviewed. Constitutional: She is oriented to person, place, and time. She appears well-developed and well-nourished.  HENT:  Head: Normocephalic and atraumatic.  Mouth/Throat: Oropharynx is clear and moist.  Small healing laceration to buccal mucosa of left cheek. 2 cm area of right lateral tongue that is healing. No pustules noted.   Eyes: Conjunctivae and EOM are normal. Pupils are equal, round, and reactive to light.  Neck: Normal range of motion.  Cardiovascular: Normal rate, regular rhythm and normal heart sounds.   Pulmonary/Chest: Effort normal and breath sounds normal.  Abdominal: Soft. Bowel sounds are normal.  Musculoskeletal: Normal range of motion.   Lymphadenopathy:    She has no cervical adenopathy.  Neurological: She is alert and oriented to person, place, and time.  Skin: Skin is warm and dry.  Psychiatric: She has a normal mood and affect.    ED Course   Procedures   DIAGNOSTIC STUDIES: Oxygen Saturation is 96% on RA, normal by my interpretation.    COORDINATION OF CARE: 9:16 PM-Discussed treatment plan which includes topical numbing medication, antibiotic and a short course of pain medication with pt at bedside and pt agreed to plan.     1. Tongue injury, initial encounter     MDM  Patient seen and evaluated. She appears well in no acute distress discomforts patient tongue. Injury appears to be healing well. No swelling.       I personally performed the services described in this documentation, which was scribed in my presence. The recorded information has been reviewed and is accurate.   Angus Seller, PA-C 11/30/12 2127

## 2012-12-01 NOTE — ED Provider Notes (Signed)
Medical screening examination/treatment/procedure(s) were performed by non-physician practitioner and as supervising physician I was immediately available for consultation/collaboration.   Audree Camel, MD 12/01/12 1130

## 2012-12-09 ENCOUNTER — Emergency Department (HOSPITAL_COMMUNITY): Admission: EM | Admit: 2012-12-09 | Payer: PRIVATE HEALTH INSURANCE | Source: Home / Self Care

## 2012-12-13 ENCOUNTER — Encounter (HOSPITAL_COMMUNITY): Payer: Self-pay | Admitting: *Deleted

## 2012-12-13 ENCOUNTER — Emergency Department (HOSPITAL_COMMUNITY)
Admission: EM | Admit: 2012-12-13 | Discharge: 2012-12-13 | Disposition: A | Discharge status: Home / Self Care | Payer: PRIVATE HEALTH INSURANCE | Attending: Emergency Medicine | Admitting: Emergency Medicine

## 2012-12-13 DIAGNOSIS — F3289 Other specified depressive episodes: Secondary | ICD-10-CM | POA: Insufficient documentation

## 2012-12-13 DIAGNOSIS — K1370 Unspecified lesions of oral mucosa: Secondary | ICD-10-CM | POA: Insufficient documentation

## 2012-12-13 DIAGNOSIS — Z79899 Other long term (current) drug therapy: Secondary | ICD-10-CM | POA: Insufficient documentation

## 2012-12-13 DIAGNOSIS — Z8711 Personal history of peptic ulcer disease: Secondary | ICD-10-CM | POA: Insufficient documentation

## 2012-12-13 DIAGNOSIS — K148 Other diseases of tongue: Secondary | ICD-10-CM

## 2012-12-13 DIAGNOSIS — Z8659 Personal history of other mental and behavioral disorders: Secondary | ICD-10-CM | POA: Insufficient documentation

## 2012-12-13 DIAGNOSIS — Z8614 Personal history of Methicillin resistant Staphylococcus aureus infection: Secondary | ICD-10-CM | POA: Insufficient documentation

## 2012-12-13 DIAGNOSIS — G40909 Epilepsy, unspecified, not intractable, without status epilepticus: Secondary | ICD-10-CM | POA: Insufficient documentation

## 2012-12-13 DIAGNOSIS — F17200 Nicotine dependence, unspecified, uncomplicated: Secondary | ICD-10-CM | POA: Insufficient documentation

## 2012-12-13 DIAGNOSIS — F329 Major depressive disorder, single episode, unspecified: Secondary | ICD-10-CM | POA: Insufficient documentation

## 2012-12-13 DIAGNOSIS — Z8719 Personal history of other diseases of the digestive system: Secondary | ICD-10-CM | POA: Insufficient documentation

## 2012-12-13 MED ORDER — MAGIC MOUTHWASH
5.0000 mL | Freq: Four times a day (QID) | ORAL | Status: DC
  Administered 2012-12-13: 5 mL via ORAL
  Filled 2012-12-13: qty 5

## 2012-12-13 MED ORDER — MAGIC MOUTHWASH W/LIDOCAINE: 5.0000 mL | Freq: Three times a day (TID) | ORAL | Status: DC | PRN

## 2012-12-13 NOTE — Discharge Instructions (Signed)
 Use the Magic mouthwash prescribed, and provided the emergency department.  3 times a day Minshew mouth with water after all try to avoid the foods that cause pain, such as citrus or with grainy, seeds, and continue taking Tylenol  or ibuprofen  for any discomfort

## 2012-12-13 NOTE — ED Provider Notes (Signed)
Medical screening examination/treatment/procedure(s) were performed by non-physician practitioner and as supervising physician I was immediately available for consultation/collaboration.   Anetra Czerwinski M Kaiyah Eber, MD 12/13/12 0420 

## 2012-12-13 NOTE — ED Provider Notes (Signed)
CSN: [id number]     Arrival date & time 12/13/12  0008 History   First MD Initiated Contact with Patient 12/13/12 0020     Chief Complaint  Patient presents with  . tongue pain    (Consider location/radiation/quality/duration/timing/severity/associated sxs/prior Treatment) HPI Comments: Patient states, about a month ago.  She had a seizure, and she to the side of her tongue, and sore ever since, but healing.  She initially was given Percocet for the pain, but she since, used all of that and is been taking ibuprofen on a regular basis.  She states, when she eats certain foods, like citrus or things with feeds.  It hurts, also, she is having difficulty brushing her teeth because she gets bristles caught in her tongue.  She is here tonight requesting additional narcotics for pain relief  The history is provided by the patient.    Past Medical History  Diagnosis Date  . Seizures   . Gastric ulcer   . MRSA (methicillin resistant staph aureus) culture positive   . Interstitial cystitis   . Depression   . Mood disorder    Past Surgical History  Procedure Laterality Date  . Shoulder arthroscopy    . Rotator cuff repair      R side   No family history on file. History  Substance Use Topics  . Smoking status: Current Every Day Smoker  . Smokeless tobacco: Not on file  . Alcohol Use: No   OB History   Grav Para Term Preterm Abortions TAB SAB Ect Mult Living                 Review of Systems  Constitutional: Negative for fever and chills.  HENT: Positive for mouth sores. Negative for sore throat, drooling and trouble swallowing.   Skin: Positive for wound.  All other systems reviewed and are negative.    Allergies  Haloperidol decanoate  Home Medications   Current Outpatient Rx  Name  Route  Sig  Dispense  Refill  . Alum & Mag Hydroxide-Simeth (MAGIC MOUTHWASH W/LIDOCAINE) SOLN   Oral   Take 5 mLs by mouth 3 (three) times daily as needed.   120 mL   0    DIPHENHYDRAMINE HCL (ANTIHISTAMINES - ETHANOLAMINE .Marland Kitchen.   . amitriptyline (ELAVIL) 100 MG tablet   Oral   Take 100 mg by mouth at bedtime as needed for sleep.          . clonazePAM (KLONOPIN) 0.5 MG tablet   Oral   Take 0.5 mg by mouth 2 (two) times daily as needed for anxiety.          Marland Kitchen HYDROcodone-acetaminophen (HYCET) 7.5-325 mg/15 ml solution   Oral   Take 15 mLs by mouth 4 (four) times daily as needed for pain.   120 mL   0   . HYDROcodone-acetaminophen (NORCO) 5-325 MG per tablet   Oral   Take 1 tablet by mouth every 4 (four) hours as needed for pain.   10 tablet   0   . ibuprofen (ADVIL,MOTRIN) 200 MG tablet   Oral   Take 600 mg by mouth every 2 (two) hours as needed for pain.          Marland Kitchen lamoTRIgine (LAMICTAL) 100 MG tablet   Oral   Take 100-200 mg by mouth 2 (two) times daily. 1 tab in am, 2 tabs in pm         . Multiple Vitamin (MULTIVITAMIN WITH MINERALS) TABS   Oral  Take 1 tablet by mouth daily.         . risperiDONE (RISPERDAL) 1 MG tablet   Oral   Take 2 mg by mouth 2 (two) times daily.         . simvastatin (ZOCOR) 20 MG tablet   Oral   Take 20 mg by mouth every evening.          BP 120/82  Pulse 95  Temp(Src) 98 F (36.7 C) (Oral)  Resp 18  Ht 5\' 6"  (1.676 m)  Wt 140 lb (63.504 kg)  BMI 22.61 kg/m2  SpO2 98% Physical Exam  Nursing note and vitals reviewed. Constitutional: She is oriented to person, place, and time. She appears well-developed and well-nourished.  HENT:  Head: Normocephalic.  Mouth/Throat: Uvula is midline.  Patient has a 3 mm x 2 mm superficial reddened area to the lateral aspect on the right side of her there is no active bleeding  Eyes: Pupils are equal, round, and reactive to light.  Neck: Normal range of motion.  Cardiovascular: Normal rate.   Pulmonary/Chest: Effort normal.  Musculoskeletal: Normal range of motion.  Neurological: She is alert and oriented to person, place, and time.  Skin: Skin is  warm and dry.    ED Course  Procedures (including critical care time) Labs Review Labs Reviewed - No data to display Imaging Review No results found.  MDM   1. Tongue lesion    Have not prescribed any narcotic pain relief, as this injury happened a month ago, and she has a history of polysubstance abuse.  She does have a primary care physician, that she can followup with    Arman Filter, NP 12/13/12 0056  Arman Filter, NP 12/13/12 0057  Arman Filter, NP 12/13/12 [phone]

## 2012-12-13 NOTE — ED Notes (Signed)
Pt states had a seizure about a month ago when she bit her tongue, states it's healing fine, but when she eats/drinks anything acidic or eats/chews ice it hurts on that side of her tongue.

## 2012-12-25 ENCOUNTER — Other Ambulatory Visit: Payer: Self-pay | Admitting: Diagnostic Neuroimaging

## 2013-01-15 ENCOUNTER — Telehealth: Payer: Self-pay | Admitting: Diagnostic Neuroimaging

## 2013-01-16 NOTE — Telephone Encounter (Signed)
I called pt and she c/o progressive memory loss, but then stated having 2 mo ago breakthru sz. (bit thru tongue).  Needs lamictal refill.  Made f/u with LL/NP 01-22-13 at 1130.  Pt stated ok.

## 2013-01-17 ENCOUNTER — Encounter: Payer: Self-pay | Admitting: Nurse Practitioner

## 2013-01-23 ENCOUNTER — Ambulatory Visit: Payer: Self-pay | Admitting: Nurse Practitioner

## 2013-02-06 ENCOUNTER — Other Ambulatory Visit: Payer: Self-pay | Admitting: Diagnostic Neuroimaging

## 2013-02-06 ENCOUNTER — Telehealth: Payer: Self-pay | Admitting: Diagnostic Neuroimaging

## 2013-02-06 NOTE — Telephone Encounter (Signed)
Rx has been sent  

## 2013-02-06 NOTE — Telephone Encounter (Signed)
Rx has already been sent

## 2013-02-09 ENCOUNTER — Observation Stay (HOSPITAL_COMMUNITY)
Admission: EM | Admit: 2013-02-09 | Discharge: 2013-02-10 | Disposition: A | Discharge status: Home / Self Care | Payer: PRIVATE HEALTH INSURANCE | Attending: Internal Medicine | Admitting: Internal Medicine

## 2013-02-09 ENCOUNTER — Encounter (HOSPITAL_COMMUNITY): Payer: Self-pay | Admitting: Emergency Medicine

## 2013-02-09 ENCOUNTER — Emergency Department (HOSPITAL_COMMUNITY): Payer: PRIVATE HEALTH INSURANCE

## 2013-02-09 DIAGNOSIS — K5730 Diverticulosis of large intestine without perforation or abscess without bleeding: Secondary | ICD-10-CM

## 2013-02-09 DIAGNOSIS — E782 Mixed hyperlipidemia: Secondary | ICD-10-CM

## 2013-02-09 DIAGNOSIS — F1010 Alcohol abuse, uncomplicated: Secondary | ICD-10-CM | POA: Insufficient documentation

## 2013-02-09 DIAGNOSIS — F1021 Alcohol dependence, in remission: Secondary | ICD-10-CM

## 2013-02-09 DIAGNOSIS — Z8673 Personal history of transient ischemic attack (TIA), and cerebral infarction without residual deficits: Secondary | ICD-10-CM | POA: Insufficient documentation

## 2013-02-09 DIAGNOSIS — F17200 Nicotine dependence, unspecified, uncomplicated: Secondary | ICD-10-CM | POA: Diagnosis present

## 2013-02-09 DIAGNOSIS — R5381 Other malaise: Secondary | ICD-10-CM | POA: Insufficient documentation

## 2013-02-09 DIAGNOSIS — G40909 Epilepsy, unspecified, not intractable, without status epilepticus: Secondary | ICD-10-CM | POA: Insufficient documentation

## 2013-02-09 DIAGNOSIS — R410 Disorientation, unspecified: Secondary | ICD-10-CM

## 2013-02-09 DIAGNOSIS — N3010 Interstitial cystitis (chronic) without hematuria: Secondary | ICD-10-CM

## 2013-02-09 DIAGNOSIS — F32A Depression, unspecified: Secondary | ICD-10-CM | POA: Diagnosis present

## 2013-02-09 DIAGNOSIS — F1410 Cocaine abuse, uncomplicated: Secondary | ICD-10-CM | POA: Insufficient documentation

## 2013-02-09 DIAGNOSIS — E785 Hyperlipidemia, unspecified: Secondary | ICD-10-CM | POA: Insufficient documentation

## 2013-02-09 DIAGNOSIS — L821 Other seborrheic keratosis: Secondary | ICD-10-CM

## 2013-02-09 DIAGNOSIS — Z79899 Other long term (current) drug therapy: Secondary | ICD-10-CM | POA: Insufficient documentation

## 2013-02-09 DIAGNOSIS — M5137 Other intervertebral disc degeneration, lumbosacral region: Secondary | ICD-10-CM

## 2013-02-09 DIAGNOSIS — N644 Mastodynia: Secondary | ICD-10-CM

## 2013-02-09 DIAGNOSIS — L723 Sebaceous cyst: Secondary | ICD-10-CM

## 2013-02-09 DIAGNOSIS — Z860100 Personal history of colon polyps, unspecified: Secondary | ICD-10-CM

## 2013-02-09 DIAGNOSIS — F519 Sleep disorder not due to a substance or known physiological condition, unspecified: Secondary | ICD-10-CM

## 2013-02-09 DIAGNOSIS — I6789 Other cerebrovascular disease: Secondary | ICD-10-CM | POA: Insufficient documentation

## 2013-02-09 DIAGNOSIS — J301 Allergic rhinitis due to pollen: Secondary | ICD-10-CM

## 2013-02-09 DIAGNOSIS — R404 Transient alteration of awareness: Principal | ICD-10-CM | POA: Insufficient documentation

## 2013-02-09 DIAGNOSIS — Z8719 Personal history of other diseases of the digestive system: Secondary | ICD-10-CM

## 2013-02-09 DIAGNOSIS — R5383 Other fatigue: Secondary | ICD-10-CM

## 2013-02-09 DIAGNOSIS — F329 Major depressive disorder, single episode, unspecified: Secondary | ICD-10-CM | POA: Insufficient documentation

## 2013-02-09 DIAGNOSIS — K219 Gastro-esophageal reflux disease without esophagitis: Secondary | ICD-10-CM | POA: Diagnosis present

## 2013-02-09 DIAGNOSIS — F411 Generalized anxiety disorder: Secondary | ICD-10-CM | POA: Insufficient documentation

## 2013-02-09 DIAGNOSIS — F3289 Other specified depressive episodes: Secondary | ICD-10-CM

## 2013-02-09 DIAGNOSIS — M51379 Other intervertebral disc degeneration, lumbosacral region without mention of lumbar back pain or lower extremity pain: Secondary | ICD-10-CM

## 2013-02-09 DIAGNOSIS — F1491 Cocaine use, unspecified, in remission: Secondary | ICD-10-CM

## 2013-02-09 LAB — COMPREHENSIVE METABOLIC PANEL WITH GFR
ALT: 18 U/L (ref 0–35)
AST: 16 U/L (ref 0–37)
Albumin: 4.3 g/dL (ref 3.5–5.2)
Alkaline Phosphatase: 86 U/L (ref 39–117)
BUN: 13 mg/dL (ref 6–23)
CO2: 23 meq/L (ref 19–32)
Calcium: 9.8 mg/dL (ref 8.4–10.5)
Chloride: 101 meq/L (ref 96–112)
Creatinine, Ser: 0.87 mg/dL (ref 0.50–1.10)
GFR calc Af Amer: 82 mL/min — ABNORMAL LOW
GFR calc non Af Amer: 71 mL/min — ABNORMAL LOW
Glucose, Bld: 97 mg/dL (ref 70–99)
Potassium: 4 meq/L (ref 3.5–5.1)
Sodium: 136 meq/L (ref 135–145)
Total Bilirubin: 0.3 mg/dL (ref 0.3–1.2)
Total Protein: 7.6 g/dL (ref 6.0–8.3)

## 2013-02-09 LAB — URINALYSIS, ROUTINE W REFLEX MICROSCOPIC
Bilirubin Urine: NEGATIVE
Glucose, UA: NEGATIVE mg/dL
Hgb urine dipstick: NEGATIVE
Ketones, ur: NEGATIVE mg/dL
Leukocytes, UA: NEGATIVE
Nitrite: NEGATIVE
Protein, ur: NEGATIVE mg/dL
Specific Gravity, Urine: 1.013 (ref 1.005–1.030)
Urobilinogen, UA: 0.2 mg/dL (ref 0.0–1.0)
pH: 6.5 (ref 5.0–8.0)

## 2013-02-09 LAB — RAPID URINE DRUG SCREEN, HOSP PERFORMED
Amphetamines: NOT DETECTED
Barbiturates: NOT DETECTED
Benzodiazepines: NOT DETECTED
Cocaine: POSITIVE — AB
Opiates: NOT DETECTED
Tetrahydrocannabinol: NOT DETECTED

## 2013-02-09 LAB — CBC WITH DIFFERENTIAL/PLATELET
Basophils Absolute: 0 10*3/uL (ref 0.0–0.1)
Basophils Relative: 1 % (ref 0–1)
Eosinophils Absolute: 0.2 10*3/uL (ref 0.0–0.7)
Eosinophils Relative: 3 % (ref 0–5)
HCT: 39.1 % (ref 36.0–46.0)
Hemoglobin: 13.5 g/dL (ref 12.0–15.0)
Lymphocytes Relative: 22 % (ref 12–46)
Lymphs Abs: 1.5 10*3/uL (ref 0.7–4.0)
MCH: 32.1 pg (ref 26.0–34.0)
MCHC: 34.5 g/dL (ref 30.0–36.0)
MCV: 92.9 fL (ref 78.0–100.0)
Monocytes Absolute: 0.3 10*3/uL (ref 0.1–1.0)
Monocytes Relative: 4 % (ref 3–12)
Neutro Abs: 4.7 10*3/uL (ref 1.7–7.7)
Neutrophils Relative %: 70 % (ref 43–77)
Platelets: 320 10*3/uL (ref 150–400)
RBC: 4.21 MIL/uL (ref 3.87–5.11)
RDW: 13.8 % (ref 11.5–15.5)
WBC: 6.7 10*3/uL (ref 4.0–10.5)

## 2013-02-09 LAB — SALICYLATE LEVEL: Salicylate Lvl: <2 mg/dL — ABNORMAL LOW (ref 2.8–20.0)

## 2013-02-09 LAB — AMMONIA: Ammonia: 32 umol/L (ref 11–60)

## 2013-02-09 LAB — ETHANOL: Alcohol, Ethyl (B): <11 mg/dL (ref 0–11)

## 2013-02-09 LAB — MRSA PCR SCREENING: MRSA by PCR: NEGATIVE

## 2013-02-09 LAB — ACETAMINOPHEN LEVEL: Acetaminophen (Tylenol), Serum: <15 ug/mL (ref 10–30)

## 2013-02-09 MED ORDER — ONDANSETRON HCL 4 MG/2ML IJ SOLN: 4.0000 mg | Freq: Four times a day (QID) | INTRAMUSCULAR | Status: DC | PRN

## 2013-02-09 MED ORDER — THIAMINE HCL 100 MG/ML IJ SOLN
100.0000 mg | Freq: Every day | INTRAMUSCULAR | Status: DC
  Filled 2013-02-09: qty 1

## 2013-02-09 MED ORDER — KETOROLAC TROMETHAMINE 30 MG/ML IJ SOLN: 30.0000 mg | Freq: Four times a day (QID) | INTRAMUSCULAR | Status: DC | PRN

## 2013-02-09 MED ORDER — ONDANSETRON HCL 4 MG PO TABS: 4.0000 mg | ORAL_TABLET | Freq: Four times a day (QID) | ORAL | Status: DC | PRN

## 2013-02-09 MED ORDER — FOLIC ACID 5 MG/ML IJ SOLN
1.0000 mg | Freq: Every day | INTRAMUSCULAR | Status: DC
  Administered 2013-02-10: 1 mg via INTRAVENOUS
  Filled 2013-02-09 (×3): qty 0.2

## 2013-02-09 MED ORDER — ENOXAPARIN SODIUM 40 MG/0.4ML ~~LOC~~ SOLN
40.0000 mg | SUBCUTANEOUS | Status: DC
  Administered 2013-02-09: 40 mg via SUBCUTANEOUS
  Filled 2013-02-09 (×2): qty 0.4

## 2013-02-09 MED ORDER — NICOTINE 21 MG/24HR TD PT24
21.0000 mg | MEDICATED_PATCH | Freq: Every day | TRANSDERMAL | Status: DC
  Administered 2013-02-10 (×2): 21 mg via TRANSDERMAL
  Filled 2013-02-09 (×2): qty 1

## 2013-02-09 MED ORDER — NALOXONE HCL 1 MG/ML IJ SOLN
1.0000 mg | Freq: Once | INTRAMUSCULAR | Status: AC
  Administered 2013-02-09: 2 mg via INTRAVENOUS
  Filled 2013-02-09: qty 2

## 2013-02-09 MED ORDER — ACETAMINOPHEN 650 MG RE SUPP: 650.0000 mg | Freq: Four times a day (QID) | RECTAL | Status: DC | PRN

## 2013-02-09 MED ORDER — SODIUM CHLORIDE 0.9 % IV BOLUS (SEPSIS)
1000.0000 mL | Freq: Once | INTRAVENOUS | Status: AC
  Administered 2013-02-09: 1000 mL via INTRAVENOUS

## 2013-02-09 MED ORDER — THIAMINE HCL 100 MG/ML IJ SOLN
Freq: Once | INTRAVENOUS | Status: AC
  Administered 2013-02-09: 17:00:00 via INTRAVENOUS
  Filled 2013-02-09: qty 1000

## 2013-02-09 MED ORDER — LORAZEPAM 2 MG/ML IJ SOLN
0.0000 mg | Freq: Two times a day (BID) | INTRAMUSCULAR | Status: DC
  Administered 2013-02-10: 1 mg via INTRAVENOUS

## 2013-02-09 MED ORDER — SODIUM CHLORIDE 0.9 % IV SOLN
500.0000 mg | Freq: Two times a day (BID) | INTRAVENOUS | Status: DC
  Administered 2013-02-09 – 2013-02-10 (×2): 500 mg via INTRAVENOUS
  Filled 2013-02-09 (×2): qty 5

## 2013-02-09 MED ORDER — SODIUM CHLORIDE 0.9 % IV SOLN
INTRAVENOUS | Status: AC
  Administered 2013-02-09: 19:00:00 via INTRAVENOUS

## 2013-02-09 MED ORDER — SODIUM CHLORIDE 0.9 % IJ SOLN
3.0000 mL | Freq: Two times a day (BID) | INTRAMUSCULAR | Status: DC
  Administered 2013-02-10: 3 mL via INTRAVENOUS

## 2013-02-09 MED ORDER — VITAMIN B-1 100 MG PO TABS
100.0000 mg | ORAL_TABLET | Freq: Every day | ORAL | Status: DC
  Administered 2013-02-10: 100 mg via ORAL
  Filled 2013-02-09: qty 1

## 2013-02-09 MED ORDER — LORAZEPAM 2 MG/ML IJ SOLN
0.0000 mg | Freq: Four times a day (QID) | INTRAMUSCULAR | Status: DC
  Administered 2013-02-10: 1 mg via INTRAVENOUS
  Filled 2013-02-09 (×2): qty 1

## 2013-02-09 MED ORDER — SODIUM CHLORIDE 0.9 % IV SOLN
INTRAVENOUS | Status: DC
  Administered 2013-02-09 (×2): via INTRAVENOUS

## 2013-02-09 NOTE — ED Notes (Signed)
Up to bathroom steady on feet alert but doesn't remember what happen fluids offered ( h20) taken well

## 2013-02-09 NOTE — ED Notes (Signed)
Pt lethargic, difficult historian. When asked pt what she took, she reported taking "a whole bottle of Ambien" last night. While continuing to question pt, she states she took "two [place] last night." Pt will wake up with verbal questioning, then immediately falls back to sleep. Pt is calm and cooperative.

## 2013-02-09 NOTE — H&P (Addendum)
[facility] History and Physical  [name] ZOX:[id number] DOB: [birth year] DOA: 02/09/2013  Referring physician:  Dr. Mancel Bale PCP:  Burtis Junes, MD   Chief Complaint:  Found wandering the street barefoot by EMS, somnolent, confused  HPI:  The patient is a 62 y.o. year-old female with history of seizure disordere, MDD, anxiety, self-injurious behaviors, polysubstance abuse, gastric ulcers, who presents with confusion, lethargy.  The patient was last at their baseline health unknown number of days ago.  She has had several presentations to the ER over the last couple of months due to biting her tongue after increased frequency of seizures.   Patient currently difficult to arouse and not speaking sensibly, level V caveat, so history obtained from records.  EMS found the patient around noon today about one block from her home, without shoes on, walking up to cars.  She admitted to taking ambien.  She told the nurse that she took a "whole bottle of ambien" last night, however, she later reported she only took two pills.  She slept during her time in the ER and was not combative.  She would awake briefly for questions but would answer nonsensically and then fall immediately back to sleep.  She currently denies chest pain, SOB, nausea, vomiting.  She endorses lower abdomen pain without dysuria.    In the ER, CBC and CMP were wnl.  Ammonia level was 32, salicylate lvl < 2, tylenol lvl < 15, UA neg, UDS positive for cocaine.  Head CT demonstrated mild small vessel disease, chronic inferior left frontal lobe infarct, no acute infarct or hemorrhage.    Review of Systems:  Patient confused, level V caveat.  See HPI.   Past Medical History  Diagnosis Date  . Seizures   . Gastric ulcer   . MRSA (methicillin resistant staph aureus) culture positive   . Interstitial cystitis   . Depression   . Mood disorder   . Anxiety    Past Surgical History  Procedure Laterality Date  .  Shoulder arthroscopy    . Rotator cuff repair      R side  . Cataract extraction    . Multiple tooth extractions     Social History:  reports that she has been smoking.  She does not have any smokeless tobacco history on file. She reports that she uses illicit drugs (Cocaine). She reports that she does not drink alcohol.   Allergies  Allergen Reactions  . Haloperidol Decanoate Anaphylaxis  . Zithromax [Azithromycin] Anaphylaxis    No family history on file.   Unable to obtain.  Patient confused.    Prior to Admission medications   Medication Sig Start Date End Date Taking? Authorizing Provider  Alum & Mag Hydroxide-Simeth (MAGIC MOUTHWASH W/LIDOCAINE) SOLN Take 5 mLs by mouth 3 (three) times daily as needed. 12/13/12   Arman Filter, NP  amitriptyline (ELAVIL) 100 MG tablet Take 100 mg by mouth at bedtime as needed for sleep.     Historical Provider, MD  clonazePAM (KLONOPIN) 0.5 MG tablet Take 0.5 mg by mouth 2 (two) times daily as needed for anxiety.     Historical Provider, MD  HYDROcodone-acetaminophen (HYCET) 7.5-325 mg/15 ml solution Take 15 mLs by mouth 4 (four) times daily as needed for pain. 11/27/12 11/27/13  Fayrene Helper, PA-C  HYDROcodone-acetaminophen (NORCO) 5-325 MG per tablet Take 1 tablet by mouth every 4 (four) hours as needed for pain. 11/30/12   Phill Mutter Dammen, PA-C  ibuprofen (ADVIL,MOTRIN) 200 MG tablet Take  600 mg by mouth every 2 (two) hours as needed for pain.     Historical Provider, MD  lamoTRIgine (LAMICTAL) 100 MG tablet Take one tablet po qam and take two tabs po qpm 02/06/13   Suanne Marker, MD  Multiple Vitamin (MULTIVITAMIN WITH MINERALS) TABS Take 1 tablet by mouth daily.    Historical Provider, MD  risperiDONE (RISPERDAL) 1 MG tablet Take 2 mg by mouth 2 (two) times daily.    Historical Provider, MD  simvastatin (ZOCOR) 20 MG tablet Take 20 mg by mouth every evening.    Historical Provider, MD   Physical Exam: Filed Vitals:   02/09/13 1348 02/09/13  1400 02/09/13 1500 02/09/13 1530  BP: 135/87 140/88 141/86 152/87  Pulse: 86 87 84 84  Resp: 18 16 15 19   SpO2: 100% 99% 99% 97%     General:  CF, snoring when I walk in room, but without respiratory distress.  Awakes somewhat to loud voice but does not open eyes.  Awake for only a few seconds before falling back to sleep.    Eyes:  PERRL, anicteric, non-injected.  ENT:  Nares clear.  Refuses to open mouth for exam, but has dark brown material around lips and mouth.  MMM  Neck:  Supple without TM or JVD.    Lymph:  No cervical, supraclavicular, or submandibular LAD.  Cardiovascular:  RRR, normal S1, S2, without m/r/g.  2+ pulses, warm extremities  Respiratory:  CTA bilaterally without increased WOB.  Abdomen:  NABS.  Soft, ND/NT.    Skin:  No rashes.  Left foot was bandaged so removed bandaged.  One two is pinned and two toes have sutures in place.    Musculoskeletal:  Normal bulk and tone.  No LE edema.  Psychiatric:  A & O x 4.  Appropriate affect.  Neurologic:  CN 3-12 intact.  5/5 strength.  Sensation intact.  Labs on Admission:  Basic Metabolic Panel:  Recent Labs Lab 02/09/13 1330  NA 136  K 4.0  CL 101  CO2 23  GLUCOSE 97  BUN 13  CREATININE 0.87  CALCIUM 9.8   Liver Function Tests:  Recent Labs Lab 02/09/13 1330  AST 16  ALT 18  ALKPHOS 86  BILITOT 0.3  PROT 7.6  ALBUMIN 4.3   No results found for this basename: LIPASE, AMYLASE,  in the last 168 hours  Recent Labs Lab 02/09/13 1345  AMMONIA 32   CBC:  Recent Labs Lab 02/09/13 1330  WBC 6.7  NEUTROABS 4.7  HGB 13.5  HCT 39.1  MCV 92.9  PLT 320   Cardiac Enzymes: No results found for this basename: CKTOTAL, CKMB, CKMBINDEX, TROPONINI,  in the last 168 hours  BNP (last 3 results) No results found for this basename: PROBNP,  in the last 8760 hours CBG: No results found for this basename: GLUCAP,  in the last 168 hours  Radiological Exams on Admission: Ct Head Wo  Contrast  02/09/2013   CLINICAL DATA:  Altered mental status  EXAM: CT HEAD WITHOUT CONTRAST  TECHNIQUE: Contiguous axial images were obtained from the base of the skull through the vertex without intravenous contrast. Study was obtained within 24 hr of patient's arrival at the emergency department.  COMPARISON:  May 01, 2012  FINDINGS: Ventricles are normal in size and configuration. There is no mass, hemorrhage, extra-axial fluid collection, or midline shift.  Mild periventricular small vessel disease in the posterior centra semiovale bilaterally is stable. There is a prior inferior left  frontal lobe infarct which is stable. There is no new gray-white compartment lesion. No acute infarct apparent.  Bony calvarium appears intact. The mastoid air cells are clear. There is right-sided ethmoid and sphenoid sinus disease.  IMPRESSION: Mild small vessel disease, stable. Inferior left frontal lobe infarct, chronic and stable. No acute appearing infarct. No mass or hemorrhage. There is right ethmoid and sphenoid sinus disease.   Electronically Signed   By: Bretta Bang M.D.   On: 02/09/2013 13:44   ECG:  NSR with borderline interventricular conduction delay.  QTc wnl  Assessment/Plan Active Problems:   TOBACCO ABUSE   DEPRESSION   GERD   ALCOHOL ABUSE, HX OF   Cocaine abuse   Lethargy  ---  Lethargy and delirium:  DDx includes drug overdose.  Given obvious recent foot surgery, she may have been given additional pain medication post-operatively which may not be detected by our rapid UDS.  She could be recovering from a cocaine high, or perhaps she did overdose on [place].  Alternatively, she may have had another seizure which is suggested by the appearance of blood around her mouth.   -  Admit to telemetry -  Telemetry and continuous pulse oximetry  -  ECG:  Borderline intraventricular delay, -  Trial of narcan -  If benzodiazepine OD, given seizure hx would be hesitant to reverse -  EEG -   Neurology consult  -  This patient should NOT be prescribed addictive medications including benzodiazepines or narcotics.  -  One patient more alert, start CIWA -  Hold elavil, benzos, and narcotics  Seizure disorder, possibly having a post-ictal state vs. Seizure.   -  Stat EEG -  Hold lamictal until patient awake enough to tolerate oral medications -  Neurology recommending keppra IV  Left foot recent surgery, toe pinning.   -  Dry dressing once daily -  Will ask more details once more awake, probably already has follow up arranged.  MDD/anxiety.  May have overdosed intentionally.  Patient unable to report whether this was a suicide attempt -  Sitter at bedside -  Hold risperidone until more awake, but would restart ASAP.   -  Patient has listed allergy to haldol.    HLD, stable.  Hold statin  Cocaine abuse, UDS positive.  SW consult  EtOH abuse.  CIWA,  once more alert -  Start thiamine and folate IV  Cigarette nicotine abuse and dependence.   -  Will start nicotine patch once patient more awake  Diet:  NPO until more alert Access:  PIV IVF:  yes Proph:  lovenox  Code Status: full Family Communication: attempted to call brother, but no answer and no other numbers listed.  Disposition Plan: Admit to telemetry  Time spent: 60 min Renae Fickle [facility] Pager [phone]  If 7PM-7AM, please contact night-coverage [email or web address] Password [email or web address] 02/09/2013, 4:20 PM

## 2013-02-09 NOTE — ED Notes (Signed)
Bed: WA20 Expected date: 02/09/13 Expected time: 12:10 PM Means of arrival: Ambulance Comments: Wandering around/ may have taken Ambien

## 2013-02-09 NOTE — ED Notes (Signed)
Patient unable to be awaken for urine sample - RN Darl Pikes aware

## 2013-02-09 NOTE — ED Provider Notes (Signed)
CSN: [id number]     Arrival date & time 02/09/13  1216 History   First MD Initiated Contact with Patient 02/09/13 1230     Chief Complaint  Patient presents with  . Altered Mental Status  . Drug Problem   (Consider location/radiation/quality/duration/timing/severity/associated sxs/prior Treatment) HPI Comments: [name] is a 62 y.o. Female who is brought in today for evaluation of confusion. She came by EMS. She is reported to have been found outside wandering and unable to give good history. Patient relates having trouble sleeping last night and taking several Ambien pills. She has an erratic history. She is unable to respond to all questions asked.  Level V caveat- confusion   Patient is a 62 y.o. female presenting with altered mental status and drug problem. The history is provided by the patient.  Altered Mental Status Drug Problem    Past Medical History  Diagnosis Date  . Seizures   . Gastric ulcer   . MRSA (methicillin resistant staph aureus) culture positive   . Interstitial cystitis   . Depression   . Mood disorder   . Anxiety    Past Surgical History  Procedure Laterality Date  . Shoulder arthroscopy    . Rotator cuff repair      R side  . Cataract extraction    . Multiple tooth extractions     No family history on file. History  Substance Use Topics  . Smoking status: Current Every Day Smoker  . Smokeless tobacco: Not on file  . Alcohol Use: No   OB History   Grav Para Term Preterm Abortions TAB SAB Ect Mult Living                 Review of Systems  Unable to perform ROS   Allergies  Haloperidol decanoate and Zithromax  Home Medications   Current Outpatient Rx  Name  Route  Sig  Dispense  Refill  . Alum & Mag Hydroxide-Simeth (MAGIC MOUTHWASH W/LIDOCAINE) SOLN   Oral   Take 5 mLs by mouth 3 (three) times daily as needed.   120 mL   0     DIPHENHYDRAMINE HCL (ANTIHISTAMINES - ETHANOLAMINE .Marland Kitchen.   . amitriptyline (ELAVIL) 100 MG  tablet   Oral   Take 100 mg by mouth at bedtime as needed for sleep.          . clonazePAM (KLONOPIN) 0.5 MG tablet   Oral   Take 0.5 mg by mouth 2 (two) times daily as needed for anxiety.          Marland Kitchen HYDROcodone-acetaminophen (HYCET) 7.5-325 mg/15 ml solution   Oral   Take 15 mLs by mouth 4 (four) times daily as needed for pain.   120 mL   0   . HYDROcodone-acetaminophen (NORCO) 5-325 MG per tablet   Oral   Take 1 tablet by mouth every 4 (four) hours as needed for pain.   10 tablet   0   . ibuprofen (ADVIL,MOTRIN) 200 MG tablet   Oral   Take 600 mg by mouth every 2 (two) hours as needed for pain.          Marland Kitchen lamoTRIgine (LAMICTAL) 100 MG tablet      Take one tablet po qam and take two tabs po qpm   90 tablet   2   . Multiple Vitamin (MULTIVITAMIN WITH MINERALS) TABS   Oral   Take 1 tablet by mouth daily.         . risperiDONE (  RISPERDAL) 1 MG tablet   Oral   Take 2 mg by mouth 2 (two) times daily.         . simvastatin (ZOCOR) 20 MG tablet   Oral   Take 20 mg by mouth every evening.          BP 141/86  Pulse 84  Resp 15  SpO2 99% Physical Exam  Nursing note and vitals reviewed. Constitutional: She appears well-developed.  Appears undernourished, older than stated age, and unkempt.  HENT:  Head: Normocephalic and atraumatic.  Eyes: Conjunctivae and EOM are normal. Pupils are equal, round, and reactive to light.  Neck: Normal range of motion and phonation normal. Neck supple.  Cardiovascular: Normal rate, regular rhythm and intact distal pulses.   Pulmonary/Chest: Effort normal and breath sounds normal. She exhibits no tenderness.  Abdominal: Soft. She exhibits no distension. There is no tenderness. There is no guarding.  Musculoskeletal: Normal range of motion.  Neurological: She is alert. She exhibits normal muscle tone.  She is oriented to person, place, and day. Her attention is diminished.  Skin: Skin is warm and dry.  Psychiatric:   Somewhat agitated. Thought process is unstable, she is unable to express multifactorial statements. No overt psychosis, hallucinations or dangerous behaviors.    ED Course  Procedures (including critical care time)  Medications  0.9 %  sodium chloride infusion (not administered)  sodium chloride 0.9 % bolus 1,000 mL (0 mLs Intravenous Stopped 02/09/13 1510)    Patient Vitals for the past 24 hrs:  BP Pulse Resp SpO2  02/09/13 1500 141/86 mmHg 84 15 99 %  02/09/13 1400 140/88 mmHg 87 16 99 %  02/09/13 1348 135/87 mmHg 86 18 100 %  02/09/13 1223 119/71 mmHg 85 20 97 %  02/09/13 1219 - - - 96 %    3:15 PM Reevaluation with update and discussion. After initial assessment and treatment, an updated evaluation reveals she is still delirious and sleepy. Sherlin Sonier L   3:19 PM-Consult complete with Hospitalist, Dr. Malachi Bonds. Patient case explained and discussed. She agrees to admit patient for further evaluation and treatment. Call ended at 1543    Labs Review Labs Reviewed  COMPREHENSIVE METABOLIC PANEL - Abnormal; Notable for the following:    GFR calc non Af Amer 71 (*)    GFR calc Af Amer 82 (*)    All other components within normal limits  SALICYLATE LEVEL - Abnormal; Notable for the following:    Salicylate Lvl <2.0 (*)    All other components within normal limits  CBC WITH DIFFERENTIAL  AMMONIA  ETHANOL  ACETAMINOPHEN LEVEL  URINALYSIS, ROUTINE W REFLEX MICROSCOPIC  URINE RAPID DRUG SCREEN (HOSP PERFORMED)   Imaging Review Ct Head Wo Contrast  02/09/2013   CLINICAL DATA:  Altered mental status  EXAM: CT HEAD WITHOUT CONTRAST  TECHNIQUE: Contiguous axial images were obtained from the base of the skull through the vertex without intravenous contrast. Study was obtained within 24 hr of patient's arrival at the emergency department.  COMPARISON:  May 01, 2012  FINDINGS: Ventricles are normal in size and configuration. There is no mass, hemorrhage, extra-axial fluid  collection, or midline shift.  Mild periventricular small vessel disease in the posterior centra semiovale bilaterally is stable. There is a prior inferior left frontal lobe infarct which is stable. There is no new gray-white compartment lesion. No acute infarct apparent.  Bony calvarium appears intact. The mastoid air cells are clear. There is right-sided ethmoid and sphenoid sinus disease.  IMPRESSION: Mild small vessel disease, stable. Inferior left frontal lobe infarct, chronic and stable. No acute appearing infarct. No mass or hemorrhage. There is right ethmoid and sphenoid sinus disease.   Electronically Signed   By: Bretta Bang M.D.   On: 02/09/2013 13:44    EKG Interpretation   None       MDM  No diagnosis found.   Initial evaluation is consistent with delirium. There is no overt psychosis. She is no overt evident cause for lethargy or altered mental status. He is to be admitted for observation and further management, based on her progression.  Nursing Notes Reviewed/ Care Coordinated, and agree without changes. Applicable Imaging Reviewed.  Interpretation of Laboratory Data incorporated into ED treatment   Flint Melter, MD 02/09/13 [phone]

## 2013-02-09 NOTE — ED Notes (Addendum)
Per EMS, Pt found wandering approx 1 block from her home with no shoes, walking up to cars talking to people she did not know. Pt admits to have take Ambien. Pt A&O and in NAD.

## 2013-02-09 NOTE — ED Notes (Signed)
Pt states that she has not been sleeping lately and she took [place]. Pt reports that she took two Ambien last pm the patient awakens to verbal stimuli, answers some questions but is confused, using word salad. Pt denies wanting to harm herself or anyone else. Pt in NAD, will monitor

## 2013-02-09 NOTE — Consult Note (Signed)
NEURO HOSPITALIST CONSULT NOTE    Reason for Consult: confusion.  HPI:                                                                                                                                          [name] is an 62 y.o. female, right handed, with a past medical history significant for depression, anxiety, polysubstance abuse, gastric ulcer, interstitial cystitis, seizure disorder ( as per patient, post head trauma seizures), who was admitted to [facility] earlier today after being found by EMS wandering in the street barefoot, confused, and walking up to cars. She has not recollection of the event and thus all information is gathered from the medical record. Upon arrival to [facility] ED she was not agitated but difficult to arouse and making no sense when speaking. She gave conflicting information to the nursing staff regarding the amount of sleeping pill (Ambien)  She took with the purpose of "overdosing myself". She takes Lamictal but admits to " forgetting to take it" once in a while. Work up in the hospital significant for urine drug screen positive for cocaine. Unremarkable CT brain, CBC, electrolytes, ammonia, and UA. Denies recent fever, infection, or falls. No HA, vertigo, double vision, difficulty swallowing, focal weakness or numbness, slurred speech, language or vision impairment. She was started on IV keppra as she was not able to take PO. At this moment, she is alert, awake, oriented, and following commands.    Past Medical History  Diagnosis Date  . Seizures   . Gastric ulcer   . MRSA (methicillin resistant staph aureus) culture positive   . Interstitial cystitis   . Depression   . Mood disorder   . Anxiety     Past Surgical History  Procedure Laterality Date  . Shoulder arthroscopy    . Rotator cuff repair      R side  . Cataract extraction    . Multiple tooth extractions      No family history on file.   Social History:  reports that she  has been smoking.  She does not have any smokeless tobacco history on file. She reports that she uses illicit drugs (Cocaine). She reports that she does not drink alcohol.  Allergies  Allergen Reactions  . Haloperidol Decanoate Anaphylaxis  . Zithromax [Azithromycin] Anaphylaxis    MEDICATIONS:  I have reviewed the patient's current medications.   ROS:                                                                                                                                       History obtained from the patient and chart review.  General ROS: negative for - chills, fatigue, fever, night sweats, weight gain or weight loss Psychological ROS: negative for - hallucinations but reports short term memory difficulties, mood swings. Ophthalmic ROS: negative for - blurry vision, double vision, eye pain or loss of vision ENT ROS: negative for - epistaxis, nasal discharge, oral lesions, sore throat, tinnitus or vertigo Allergy and Immunology ROS: negative for - hives or itchy/watery eyes Hematological and Lymphatic ROS: negative for - bleeding problems, bruising or swollen lymph nodes Endocrine ROS: negative for - galactorrhea, hair pattern changes, polydipsia/polyuria or temperature intolerance Respiratory ROS: negative for - cough, hemoptysis, shortness of breath or wheezing Cardiovascular ROS: negative for - chest pain, dyspnea on exertion, edema or irregular heartbeat Gastrointestinal ROS: negative for - abdominal pain, diarrhea, hematemesis, nausea/vomiting or stool incontinence Genito-Urinary ROS: negative for - dysuria, hematuria, incontinence or urinary frequency/urgency Musculoskeletal ROS: negative for - joint swelling or muscular weakness Neurological ROS: as noted in HPI Dermatological ROS: negative for rash and skin lesion changes   Physical exam: pleasant female  in no apparent distress.Blood pressure 114/75, pulse 80, temperature 97.4 F (36.3 C), temperature source Axillary, resp. rate 19, height 5\' 6"  (1.676 m), weight 65.9 kg (145 lb 4.5 oz), SpO2 96.00%.  Head: normocephalic. Neck: supple, no bruits, no JVD. Cardiac: no murmurs. Lungs: clear. Abdomen: soft, no tender, no mass. Extremities: no edema.   Neurologic Examination:                                                                                                      Mental Status: Alert, awake, oriented x 4, thought content appropriate. Comprehension, naming, and repetition intact. Speech fluent without evidence of aphasia.  Able to follow 3 step commands without difficulty. Cranial Nerves: II: Discs flat bilaterally; Visual fields grossly normal, pupils equal, round, reactive to light and accommodation III,IV, VI: ptosis not present, extra-ocular motions intact bilaterally V,VII: smile symmetric, facial light touch sensation normal bilaterally VIII: hearing normal bilaterally IX,X: gag reflex present XI: bilateral shoulder shrug XII: midline tongue extension without atrophy or fasciculations  Motor: Right : Upper extremity   5/5    Left:     Upper extremity  5/5  Lower extremity   5/5     Lower extremity   5/5 Tone and bulk:normal tone throughout; no atrophy noted Sensory: Pinprick and light touch intact throughout, bilaterally Deep Tendon Reflexes:  Right: Upper Extremity   Left: Upper extremity   biceps (C-5 to C-6) 2/4   biceps (C-5 to C-6) 2/4 tricep (C7) 2/4    triceps (C7) 2/4 Brachioradialis (C6) 2/4  Brachioradialis (C6) 2/4  Lower Extremity Lower Extremity  quadriceps (L-2 to L-4) 2/4   quadriceps (L-2 to L-4) 2/4 Achilles (S1) 2/4   Achilles (S1) 2/4  Plantars: Right: downgoing   Left: downgoing Cerebellar: normal finger-to-nose,  normal heel-to-shin test Gait:  No ataxia. CV: pulses palpable throughout    Lab Results  Component Value Date/Time   CHOL  161 01/12/2007  9:40 PM    Results for orders placed during the hospital encounter of 02/09/13 (from the past 48 hour(s))  CBC WITH DIFFERENTIAL     Status: None   Collection Time    02/09/13  1:30 PM      Result Value Range   WBC 6.7  4.0 - 10.5 K/uL   RBC 4.21  3.87 - 5.11 MIL/uL   Hemoglobin 13.5  12.0 - 15.0 g/dL   HCT 52.8  41.3 - 24.4 %   MCV 92.9  78.0 - 100.0 fL   MCH 32.1  26.0 - 34.0 pg   MCHC 34.5  30.0 - 36.0 g/dL   RDW 01.0  27.2 - 53.6 %   Platelets 320  150 - 400 K/uL   Neutrophils Relative % 70  43 - 77 %   Neutro Abs 4.7  1.7 - 7.7 K/uL   Lymphocytes Relative 22  12 - 46 %   Lymphs Abs 1.5  0.7 - 4.0 K/uL   Monocytes Relative 4  3 - 12 %   Monocytes Absolute 0.3  0.1 - 1.0 K/uL   Eosinophils Relative 3  0 - 5 %   Eosinophils Absolute 0.2  0.0 - 0.7 K/uL   Basophils Relative 1  0 - 1 %   Basophils Absolute 0.0  0.0 - 0.1 K/uL  COMPREHENSIVE METABOLIC PANEL     Status: Abnormal   Collection Time    02/09/13  1:30 PM      Result Value Range   Sodium 136  135 - 145 mEq/L   Potassium 4.0  3.5 - 5.1 mEq/L   Chloride 101  96 - 112 mEq/L   CO2 23  19 - 32 mEq/L   Glucose, Bld 97  70 - 99 mg/dL   BUN 13  6 - 23 mg/dL   Creatinine, Ser 6.44  0.50 - 1.10 mg/dL   Calcium 9.8  8.4 - 03.4 mg/dL   Total Protein 7.6  6.0 - 8.3 g/dL   Albumin 4.3  3.5 - 5.2 g/dL   AST 16  0 - 37 U/L   ALT 18  0 - 35 U/L   Alkaline Phosphatase 86  39 - 117 U/L   Total Bilirubin 0.3  0.3 - 1.2 mg/dL   GFR calc non Af Amer 71 (*) >90 mL/min   GFR calc Af Amer 82 (*) >90 mL/min   Comment: (NOTE)     The eGFR has been calculated using the CKD EPI equation.     This calculation has not been validated in all clinical situations.     eGFR's persistently <90 mL/min signify possible Chronic Kidney     Disease.  ETHANOL     Status: None   Collection Time    02/09/13  1:30 PM      Result Value Range   Alcohol, Ethyl (B) <11  0 - 11 mg/dL   Comment:            LOWEST DETECTABLE LIMIT FOR      SERUM ALCOHOL IS 11 mg/dL     FOR MEDICAL PURPOSES ONLY  SALICYLATE LEVEL     Status: Abnormal   Collection Time    02/09/13  1:30 PM      Result Value Range   Salicylate Lvl <2.0 (*) 2.8 - 20.0 mg/dL  ACETAMINOPHEN LEVEL     Status: None   Collection Time    02/09/13  1:30 PM      Result Value Range   Acetaminophen (Tylenol), Serum <15.0  10 - 30 ug/mL   Comment:            THERAPEUTIC CONCENTRATIONS VARY     SIGNIFICANTLY. A RANGE OF 10-30     ug/mL MAY BE AN EFFECTIVE     CONCENTRATION FOR MANY PATIENTS.     HOWEVER, SOME ARE BEST TREATED     AT CONCENTRATIONS OUTSIDE THIS     RANGE.     ACETAMINOPHEN CONCENTRATIONS     >150 ug/mL AT 4 HOURS AFTER     INGESTION AND >50 ug/mL AT 12     HOURS AFTER INGESTION ARE     OFTEN ASSOCIATED WITH TOXIC     REACTIONS.  AMMONIA     Status: None   Collection Time    02/09/13  1:45 PM      Result Value Range   Ammonia 32  11 - 60 umol/L  URINALYSIS, ROUTINE W REFLEX MICROSCOPIC     Status: None   Collection Time    02/09/13  3:05 PM      Result Value Range   Color, Urine YELLOW  YELLOW   APPearance CLEAR  CLEAR   Specific Gravity, Urine 1.013  1.005 - 1.030   pH 6.5  5.0 - 8.0   Glucose, UA NEGATIVE  NEGATIVE mg/dL   Hgb urine dipstick NEGATIVE  NEGATIVE   Bilirubin Urine NEGATIVE  NEGATIVE   Ketones, ur NEGATIVE  NEGATIVE mg/dL   Protein, ur NEGATIVE  NEGATIVE mg/dL   Urobilinogen, UA 0.2  0.0 - 1.0 mg/dL   Nitrite NEGATIVE  NEGATIVE   Leukocytes, UA NEGATIVE  NEGATIVE   Comment: MICROSCOPIC NOT DONE ON URINES WITH NEGATIVE PROTEIN, BLOOD, LEUKOCYTES, NITRITE, OR GLUCOSE <1000 mg/dL.  URINE RAPID DRUG SCREEN (HOSP PERFORMED)     Status: Abnormal   Collection Time    02/09/13  3:06 PM      Result Value Range   Opiates NONE DETECTED  NONE DETECTED   Cocaine POSITIVE (*) NONE DETECTED   Benzodiazepines NONE DETECTED  NONE DETECTED   Amphetamines NONE DETECTED  NONE DETECTED   Tetrahydrocannabinol NONE DETECTED  NONE  DETECTED   Barbiturates NONE DETECTED  NONE DETECTED   Comment:            DRUG SCREEN FOR MEDICAL PURPOSES     ONLY.  IF CONFIRMATION IS NEEDED     FOR ANY PURPOSE, NOTIFY LAB     WITHIN 5 DAYS.                LOWEST DETECTABLE LIMITS     FOR URINE DRUG SCREEN     Drug Class  Cutoff (ng/mL)     Amphetamine      1000     Barbiturate      200     Benzodiazepine   200     Tricyclics       300     Opiates          300     Cocaine          300     THC              50  MRSA PCR SCREENING     Status: None   Collection Time    02/09/13  6:14 PM      Result Value Range   MRSA by PCR NEGATIVE  NEGATIVE   Comment:            The GeneXpert MRSA Assay (FDA     approved for NASAL specimens     only), is one component of a     comprehensive MRSA colonization     surveillance program. It is not     intended to diagnose MRSA     infection nor to guide or     monitor treatment for     MRSA infections.    Ct Head Wo Contrast  02/09/2013   CLINICAL DATA:  Altered mental status  EXAM: CT HEAD WITHOUT CONTRAST  TECHNIQUE: Contiguous axial images were obtained from the base of the skull through the vertex without intravenous contrast. Study was obtained within 24 hr of patient's arrival at the emergency department.  COMPARISON:  May 01, 2012  FINDINGS: Ventricles are normal in size and configuration. There is no mass, hemorrhage, extra-axial fluid collection, or midline shift.  Mild periventricular small vessel disease in the posterior centra semiovale bilaterally is stable. There is a prior inferior left frontal lobe infarct which is stable. There is no new gray-white compartment lesion. No acute infarct apparent.  Bony calvarium appears intact. The mastoid air cells are clear. There is right-sided ethmoid and sphenoid sinus disease.  IMPRESSION: Mild small vessel disease, stable. Inferior left frontal lobe infarct, chronic and stable. No acute appearing infarct. No mass or hemorrhage. There  is right ethmoid and sphenoid sinus disease.   Electronically Signed   By: Bretta Bang M.D.   On: 02/09/2013 13:44     Assessment/Plan: 62 y/o admitted with altered mental status that now seems to be resolved. Seizure with post ictal state (resolved)?. Cocaine related transient confusional state? EEG has been requested. Can resume Lamictal when considered safe to take PO. Will follow up.   Wyatt Portela, MD [facility] [phone]  02/09/2013, 10:15 PM

## 2013-02-10 DIAGNOSIS — F329 Major depressive disorder, single episode, unspecified: Secondary | ICD-10-CM

## 2013-02-10 DIAGNOSIS — F3289 Other specified depressive episodes: Secondary | ICD-10-CM

## 2013-02-10 LAB — TSH: TSH: 1.504 u[IU]/mL (ref 0.350–4.500)

## 2013-02-10 MED ORDER — SIMVASTATIN 20 MG PO TABS
20.0000 mg | ORAL_TABLET | Freq: Every day | ORAL | Status: DC
  Filled 2013-02-10: qty 1

## 2013-02-10 MED ORDER — RISPERIDONE 2 MG PO TABS
2.0000 mg | ORAL_TABLET | Freq: Two times a day (BID) | ORAL | Status: DC
  Filled 2013-02-10 (×2): qty 1

## 2013-02-10 MED ORDER — RISPERIDONE 2 MG PO TABS: 2.0000 mg | ORAL_TABLET | Freq: Once | ORAL | Status: DC

## 2013-02-10 MED ORDER — CLONAZEPAM 0.5 MG PO TABS
0.5000 mg | ORAL_TABLET | Freq: Once | ORAL | Status: AC
  Administered 2013-02-10: 0.5 mg via ORAL
  Filled 2013-02-10: qty 1

## 2013-02-10 MED ORDER — LAMOTRIGINE 100 MG PO TABS
100.0000 mg | ORAL_TABLET | Freq: Every day | ORAL | Status: DC
  Administered 2013-02-10: 100 mg via ORAL
  Filled 2013-02-10: qty 1

## 2013-02-10 MED ORDER — LAMOTRIGINE 200 MG PO TABS
200.0000 mg | ORAL_TABLET | Freq: Every day | ORAL | Status: DC
  Filled 2013-02-10: qty 1

## 2013-02-10 NOTE — Progress Notes (Signed)
Pt left with Security at 1525 this afternoon after being discharged to bus stop with free bus ticket given to her by facility. Discharge instructions given/explained with pt verbalizing understanding.  Pt states that "the first thing I will do is get to AA meeting". No s/s of distress noted. Pt alert, oriented, and ambulatory.

## 2013-02-10 NOTE — Progress Notes (Signed)
At 2000 last night pt was withdrawn, lethargic, not following commands.  Then in the 9 o'clock hour pt suddenly became alert, oriented, social, talkative, requesting to eat, and cooperative.  She slept well thru night and woke up this AM and continues to be in good spirits with lots of energy.  States she still doesn't recall how much medication she took at home but states she definitely was not trying to harm herself.  Made verbal contract c nurse to contact someone if she feels like SI.  Sitter remains at bedside and all SI precautions remain in place at this time.  Will continue to monitor

## 2013-02-10 NOTE — Discharge Summary (Signed)
Physician Discharge Summary  Naraly Fritcher NWG:[id number] DOB: [birth year] DOA: 02/09/2013  PCP: Burtis Junes, MD  Admit date: 02/09/2013 Discharge date: 02/10/2013  Recommendations for Outpatient Follow-up:  1. Patient to please follow up with neurology within 1 month if possible 2. Encouraged abstinence from cocaine and to avoid sedating medications such as ambien and narcotics.  Discharge Diagnoses:  Active Problems:   TOBACCO ABUSE   DEPRESSION   GERD   ALCOHOL ABUSE, HX OF   Cocaine abuse   Lethargy   Discharge Condition: stable, improved  Diet recommendation: healthy heart  Wt Readings from Last 3 Encounters:  02/09/13 65.9 kg (145 lb 4.5 oz)  12/14/11 63.504 kg (140 lb)  12/13/12 63.504 kg (140 lb)    History of present illness:  The patient is a 62 y.o. year-old female with history of seizure disordere, MDD, anxiety, self-injurious behaviors, polysubstance abuse, gastric ulcers, who presents with confusion, lethargy. The patient was last at their baseline health unknown number of days ago. She has had several presentations to the ER over the last couple of months due to biting her tongue after increased frequency of seizures. Patient currently difficult to arouse and not speaking sensibly, level V caveat, so history obtained from records. EMS found the patient around noon today about one block from her home, without shoes on, walking up to cars. She admitted to taking ambien. She told the nurse that she took a "whole bottle of ambien" last night, however, she later reported she only took two pills. She slept during her time in the ER and was not combative. She would awake briefly for questions but would answer nonsensically and then fall immediately back to sleep. She currently denies chest pain, SOB, nausea, vomiting. She endorses lower abdomen pain without dysuria.  In the ER, CBC and CMP were wnl. Ammonia level was 32, salicylate lvl < 2, tylenol lvl < 15, UA neg, UDS  positive for cocaine. Head CT demonstrated mild small vessel disease, chronic inferior left frontal lobe infarct, no acute infarct or hemorrhage.   Hospital Course:   Lethargy and delirium:  Most likely due to cocaine use in the setting of ambien.  She states she may have taken too many [place] and she has recently been using more cocaine and when she uses cocaine she has seizurse.  She had blood in and around her mouth suggesting she may have had a seizure prior to admission. ECG: Borderline intraventricular delay.  Telemetry:  NSR.  She did not awake with narcan.  She was monitored overnight and awoke.  She was able to eat and drink without difficulty and stated she would like to go home.  She plans to stop using cocaine she states.  I explained that she has small vessel disease of the brain and she has had a stroke, both of which may have been caused by cocaine and if she continues to use cocaine, her memory and writing difficulties may worsen.  She may have life-threatening seizures or progressive dementia.  She voiced understanding.    Seizure disorder, possibly having a post-ictal state vs. Seizure.  Given IV keppra while lethargic, but restarted on lamictal at previous dose and advised not to use cocaine.  Left foot recent surgery, toe pinning.  Dry dressing once daily.  Has follow up arranged for later this week.   MDD/anxiety. Denies SI.  Continue risperidone.    HLD, stable. Hold statin   Cocaine abuse, UDS positive. SW consult   EtOH abuse. Denies use  in many years.    Cigarette nicotine abuse and dependence.    Procedures:  CT head  Consultations:  Neurology  Discharge Exam: Filed Vitals:   02/10/13 0600  BP: 103/68  Pulse: 72  Temp:   Resp:    Filed Vitals:   02/09/13 2115 02/09/13 2359 02/10/13 0404 02/10/13 0600  BP: 114/75 120/79 103/65 103/68  Pulse: 80 80 72 72  Temp: 97.4 F (36.3 C)  98.1 F (36.7 C)   TempSrc: Axillary  Oral   Resp: 19  18   Height:       Weight:      SpO2: 96%  96%     General: Thin CF, no acute distress, awake, alert and pleasant, oriented to person, place, and date HEENT:  Poor dentition, PERRL Cardiovascular: RRR, no mrg, 2+  pulses Respiratory: CTAB ABD:  NABS, soft, ND/NT MSK:  No LEE NEURO:  Grossly intact  Discharge Instructions      Discharge Orders   Future Appointments Provider Department Dept Phone   03/20/2013 11:00 AM Ronal Fear, NP [facility] [phone]   Future Orders Complete By Expires   Call MD for:  difficulty breathing, headache or visual disturbances  As directed    Call MD for:  extreme fatigue  As directed    Call MD for:  hives  As directed    Call MD for:  persistant dizziness or light-headedness  As directed    Call MD for:  persistant nausea and vomiting  As directed    Call MD for:  severe uncontrolled pain  As directed    Call MD for:  temperature >100.4  As directed    Diet - low sodium heart healthy  As directed    Discharge instructions  As directed    Comments:     You were hospitalized with cocaine overdose.  You may have had a seizure from using cocaine.  Please continue taking your lamictal and clonazepam to prevent having a seizure.  Throw away your Martin and narcotic medications.  Stop using cocaine and go to meetings to help yourself quit.  Move if you have to to get away from cocaine.  You have brain damage including small vessel disease and a stroke in the left front part of your brain, and now you have memory problems and difficulty with writing because of it.  If you keep using cocaine these problems will worsen and you will develop dementia.  Please see your neurologist.   Driving Restrictions  As directed    Comments:     No driving, operating heavy machinery, swimming unsupervised, climbing, or babysitting.   Increase activity slowly  As directed        Medication List    STOP taking these medications       zolpidem 10 MG tablet   Commonly known as:  AMBIEN      TAKE these medications       amitriptyline 100 MG tablet  Commonly known as:  ELAVIL  Take 100 mg by mouth at bedtime as needed for sleep.     clonazePAM 0.5 MG tablet  Commonly known as:  KLONOPIN  Take 0.5 mg by mouth 2 (two) times daily as needed for anxiety.     lamoTRIgine 100 MG tablet  Commonly known as:  LAMICTAL  Take 100-200 mg by mouth 2 (two) times daily. Take 100mg  in the morning and 200mg  in the evening     multivitamin with minerals Tabs  tablet  Take 1 tablet by mouth daily.     risperiDONE 1 MG tablet  Commonly known as:  RISPERDAL  Take 2 mg by mouth 2 (two) times daily.     simvastatin 20 MG tablet  Commonly known as:  ZOCOR  Take 20 mg by mouth every evening.       Follow-up Information   Schedule an appointment as soon as possible for a visit with [facility]. (walk in)    Specialty:  Behavioral Health   Contact information:   [address] [place] [address] [phone]       The results of significant diagnostics from this hospitalization (including imaging, microbiology, ancillary and laboratory) are listed below for reference.    Significant Diagnostic Studies: Ct Head Wo Contrast  02/09/2013   CLINICAL DATA:  Altered mental status  EXAM: CT HEAD WITHOUT CONTRAST  TECHNIQUE: Contiguous axial images were obtained from the base of the skull through the vertex without intravenous contrast. Study was obtained within 24 hr of patient's arrival at the emergency department.  COMPARISON:  May 01, 2012  FINDINGS: Ventricles are normal in size and configuration. There is no mass, hemorrhage, extra-axial fluid collection, or midline shift.  Mild periventricular small vessel disease in the posterior centra semiovale bilaterally is stable. There is a prior inferior left frontal lobe infarct which is stable. There is no new gray-white compartment lesion. No acute infarct  apparent.  Bony calvarium appears intact. The mastoid air cells are clear. There is right-sided ethmoid and sphenoid sinus disease.  IMPRESSION: Mild small vessel disease, stable. Inferior left frontal lobe infarct, chronic and stable. No acute appearing infarct. No mass or hemorrhage. There is right ethmoid and sphenoid sinus disease.   Electronically Signed   By: Bretta Bang M.D.   On: 02/09/2013 13:44    Microbiology: Recent Results (from the past 240 hour(s))  MRSA PCR SCREENING     Status: None   Collection Time    02/09/13  6:14 PM      Result Value Range Status   MRSA by PCR NEGATIVE  NEGATIVE Final   Comment:            The GeneXpert MRSA Assay (FDA     approved for NASAL specimens     only), is one component of a     comprehensive MRSA colonization     surveillance program. It is not     intended to diagnose MRSA     infection nor to guide or     monitor treatment for     MRSA infections.     Labs: Basic Metabolic Panel:  Recent Labs Lab 02/09/13 1330  NA 136  K 4.0  CL 101  CO2 23  GLUCOSE 97  BUN 13  CREATININE 0.87  CALCIUM 9.8   Liver Function Tests:  Recent Labs Lab 02/09/13 1330  AST 16  ALT 18  ALKPHOS 86  BILITOT 0.3  PROT 7.6  ALBUMIN 4.3   No results found for this basename: LIPASE, AMYLASE,  in the last 168 hours  Recent Labs Lab 02/09/13 1345  AMMONIA 32   CBC:  Recent Labs Lab 02/09/13 1330  WBC 6.7  NEUTROABS 4.7  HGB 13.5  HCT 39.1  MCV 92.9  PLT 320   Cardiac Enzymes: No results found for this basename: CKTOTAL, CKMB, CKMBINDEX, TROPONINI,  in the last 168 hours BNP: BNP (last 3 results) No results found for this basename: PROBNP,  in the last 8760 hours CBG: No results found for this basename: GLUCAP,  in the last 168 hours  Time coordinating discharge: 45 minutes  Signed:  Shamaria Kavan  [facility] 02/10/2013, 11:41 AM

## 2013-02-10 NOTE — Progress Notes (Signed)
UR Completed.  [name], [name] [phone] 02/10/2013  

## 2013-03-20 ENCOUNTER — Ambulatory Visit: Payer: Self-pay | Admitting: Nurse Practitioner

## 2013-04-26 ENCOUNTER — Ambulatory Visit: Payer: Self-pay | Admitting: Nurse Practitioner

## 2013-05-04 ENCOUNTER — Other Ambulatory Visit: Payer: Self-pay | Admitting: Diagnostic Neuroimaging

## 2013-07-27 ENCOUNTER — Encounter (HOSPITAL_COMMUNITY): Payer: Self-pay | Admitting: Emergency Medicine

## 2013-07-27 ENCOUNTER — Emergency Department (HOSPITAL_COMMUNITY)
Admission: EM | Admit: 2013-07-27 | Discharge: 2013-07-28 | Disposition: A | Discharge status: Home / Self Care | Payer: Medicare Other | Attending: Emergency Medicine | Admitting: Emergency Medicine

## 2013-07-27 DIAGNOSIS — G8929 Other chronic pain: Secondary | ICD-10-CM | POA: Insufficient documentation

## 2013-07-27 DIAGNOSIS — F3289 Other specified depressive episodes: Secondary | ICD-10-CM | POA: Insufficient documentation

## 2013-07-27 DIAGNOSIS — Z8719 Personal history of other diseases of the digestive system: Secondary | ICD-10-CM | POA: Insufficient documentation

## 2013-07-27 DIAGNOSIS — M545 Low back pain, unspecified: Secondary | ICD-10-CM | POA: Insufficient documentation

## 2013-07-27 DIAGNOSIS — M549 Dorsalgia, unspecified: Secondary | ICD-10-CM

## 2013-07-27 DIAGNOSIS — F411 Generalized anxiety disorder: Secondary | ICD-10-CM | POA: Insufficient documentation

## 2013-07-27 DIAGNOSIS — F329 Major depressive disorder, single episode, unspecified: Secondary | ICD-10-CM | POA: Insufficient documentation

## 2013-07-27 DIAGNOSIS — Z79899 Other long term (current) drug therapy: Secondary | ICD-10-CM | POA: Insufficient documentation

## 2013-07-27 DIAGNOSIS — G40909 Epilepsy, unspecified, not intractable, without status epilepticus: Secondary | ICD-10-CM | POA: Insufficient documentation

## 2013-07-27 DIAGNOSIS — Z8614 Personal history of Methicillin resistant Staphylococcus aureus infection: Secondary | ICD-10-CM | POA: Insufficient documentation

## 2013-07-27 DIAGNOSIS — F17200 Nicotine dependence, unspecified, uncomplicated: Secondary | ICD-10-CM | POA: Insufficient documentation

## 2013-07-27 DIAGNOSIS — Z87448 Personal history of other diseases of urinary system: Secondary | ICD-10-CM | POA: Insufficient documentation

## 2013-07-27 MED ORDER — OXYCODONE-ACETAMINOPHEN 5-325 MG PO TABS
1.0000 | ORAL_TABLET | Freq: Once | ORAL | Status: AC
  Administered 2013-07-27: 1 via ORAL
  Filled 2013-07-27: qty 1

## 2013-07-27 NOTE — ED Notes (Signed)
Per EMS pt coming from home with c/o sudden onset of lower back pain 2 hrs ago, that gets worse with movement. Denies injury and/or trauma. Per EMS pt ambulatory on scene. Pain is 10/10.

## 2013-07-27 NOTE — ED Notes (Signed)
Bed: WA10 Expected date: 07/27/13 Expected time: 9:05 PM Means of arrival: Ambulance Comments: Back pain

## 2013-07-27 NOTE — ED Provider Notes (Signed)
CSN: [id number]     Arrival date & time 07/27/13  2120 History   First MD Initiated Contact with Patient 07/27/13 2128     Chief Complaint  Patient presents with  . Back Pain   HPI  [name] is a 63 y.o. female with a PMH of seizures, gastric ulcer, MRSA, interstitial cystitis, depression, and anxiety who presents to the ED for evaluation of back pain. History was provided by the patient. Patient states she has had back pain for the past 5-6 years. Patient states that she went to an orthopedic doctor years ago and was told she has DDD and had a cortisone injection in her back, but "I am never doing that again." She states that a few hours PTA she was lifting a heavy vacuum cleaner to clean the house. A short while later, she had left lower back pain without radiation. Pain is similar to her chronic back pain. Pain is sharp and worse with movement. Improved at rest. Took Ibuprofen with no relief. She denies any weakness, loss of sensation, numbness/tingling, loss of bowel/bladder function, dysuria, hematuria, abdominal pain, chest pain, SOB, fatigue, change in appetite/activity. Patient has been able to ambulate but this has been difficulty due to pain. Patient denies any hx of cancer or IV drug use. No recent fevers, weight loss or night sweats.    Past Medical History  Diagnosis Date  . Seizures   . Gastric ulcer   . MRSA (methicillin resistant staph aureus) culture positive   . Interstitial cystitis   . Depression   . Mood disorder   . Anxiety    Past Surgical History  Procedure Laterality Date  . Shoulder arthroscopy    . Rotator cuff repair      R side  . Cataract extraction    . Multiple tooth extractions     No family history on file. History  Substance Use Topics  . Smoking status: Current Every Day Smoker  . Smokeless tobacco: Not on file  . Alcohol Use: No   OB History   Grav Para Term Preterm Abortions TAB SAB Ect Mult Living                  Review of Systems   Constitutional: Negative for fever, chills, diaphoresis, activity change, appetite change and fatigue.  Respiratory: Negative for cough and shortness of breath.   Cardiovascular: Negative for chest pain and leg swelling.  Gastrointestinal: Negative for nausea, vomiting and abdominal pain.  Genitourinary: Negative for dysuria and difficulty urinating.  Musculoskeletal: Positive for back pain. Negative for arthralgias, gait problem, myalgias and neck pain.  Skin: Negative for color change.  Neurological: Negative for weakness, numbness and headaches.    Allergies  Haloperidol decanoate and Zithromax  Home Medications   Current Outpatient Rx  Name  Route  Sig  Dispense  Refill  . amitriptyline (ELAVIL) 100 MG tablet   Oral   Take 100 mg by mouth at bedtime as needed for sleep.          . clonazePAM (KLONOPIN) 0.5 MG tablet   Oral   Take 0.5 mg by mouth 2 (two) times daily as needed for anxiety.          . lamoTRIgine (LAMICTAL) 100 MG tablet   Oral   Take 100-200 mg by mouth 2 (two) times daily. Take 100mg  in the morning and 200mg  in the evening         . lamoTRIgine (LAMICTAL) 100 MG tablet  take 1 tablet by mouth every morning and take 2 tablets by mouth every evening   90 tablet   3   . Multiple Vitamin (MULTIVITAMIN WITH MINERALS) TABS   Oral   Take 1 tablet by mouth daily.         . risperiDONE (RISPERDAL) 1 MG tablet   Oral   Take 2 mg by mouth 2 (two) times daily.         . simvastatin (ZOCOR) 20 MG tablet   Oral   Take 20 mg by mouth every evening.          BP 93/56  Pulse 87  Temp(Src) 97.9 F (36.6 C) (Oral)  Resp 18  SpO2 95%  Filed Vitals:   07/27/13 2126 07/27/13 2313 07/28/13 0007 07/28/13 0252  BP: 93/56 101/64 124/82 130/81  Pulse: 87 76 78 88  Temp: 97.9 F (36.6 C)   98.4 F (36.9 C)  TempSrc: Oral   Oral  Resp: 18 18 15 18   SpO2: 95% 95% 98% 95%    Physical Exam  Nursing note and vitals reviewed. Constitutional:  She is oriented to person, place, and time. She appears well-developed and well-nourished. No distress.  HENT:  Head: Normocephalic and atraumatic.  Right Ear: External ear normal.  Left Ear: External ear normal.  Nose: Nose normal.  Mouth/Throat: Oropharynx is clear and moist.  Eyes: Conjunctivae are normal. Right eye exhibits no discharge. Left eye exhibits no discharge.  Neck: Normal range of motion. Neck supple.  Cardiovascular: Normal rate, regular rhythm, normal heart sounds and intact distal pulses.  Exam reveals no gallop and no friction rub.   No murmur heard. Dorsalis pedis pulses present and equal bilaterally  Pulmonary/Chest: Effort normal and breath sounds normal. No respiratory distress. She has no wheezes. She has no rales. She exhibits no tenderness.  Abdominal: Soft. She exhibits no distension and no mass. There is no tenderness. There is no rebound and no guarding.  Musculoskeletal: Normal range of motion. She exhibits tenderness. She exhibits no edema.       Arms: Tenderness to palpation to the left lumbar paraspinal muscles diffusely. Pain worse with sitting up. Negative straight leg raise. Strength 5/5 in the upper and lower extremities bilaterally. Patient able to ambulate without difficulty or ataxia.   Neurological: She is alert and oriented to person, place, and time.  Patellar reflexes intact bilaterally. Sensation intact in the lower extremities bilaterally  Skin: Skin is warm and dry. She is not diaphoretic.    ED Course  Procedures (including critical care time) Labs Review Labs Reviewed - No data to display Imaging Review No results found.   EKG Interpretation None      Results for orders placed during the hospital encounter of 07/27/13  URINALYSIS, ROUTINE W REFLEX MICROSCOPIC      Result Value Ref Range   Color, Urine YELLOW  YELLOW   APPearance CLEAR  CLEAR   Specific Gravity, Urine 1.018  1.005 - 1.030   pH 7.5  5.0 - 8.0   Glucose, UA  NEGATIVE  NEGATIVE mg/dL   Hgb urine dipstick NEGATIVE  NEGATIVE   Bilirubin Urine NEGATIVE  NEGATIVE   Ketones, ur NEGATIVE  NEGATIVE mg/dL   Protein, ur NEGATIVE  NEGATIVE mg/dL   Urobilinogen, UA 0.2  0.0 - 1.0 mg/dL   Nitrite NEGATIVE  NEGATIVE   Leukocytes, UA NEGATIVE  NEGATIVE    MDM   [name] is a 63 y.o. female with a PMH of  seizures, gastric ulcer, MRSA, interstitial cystitis, depression, and anxiety who presents to the ED for evaluation of back pain  Rechecks  2:15 AM = patient resting comfortably. States pain is improving.    Etiology of back pain likely musculoskeletal in nature. Possibly muscle spasm/strain and or chronic back pain exacerbation. Back pain similar to chronic back pain in the past with no acute changes. Pain reproducible and worse with movement. No injuries or trauma. Patient had improvement in her pain throughout her ED visit. Patient neurovascularly intact with no focal neurological deficits. UA negative for UTI. No warning signs or symptoms of back pain including loss of bowel or bladder control, night sweats, waking from sleep with back pain, unexplained fevers or weight loss, history of cancer, or IV drug use. No concern for cauda equina, epidural abscess, or other serious/life threatening cause of back pain. Patient has a history of polysubstance abuse and self injury. I am hesitant to prescribe narcotics or other sedating medications at this time. Inform patient to followup with her primary doctor for further evaluation and management. Take ibuprofen or Tylenol at home as needed at home. Return precautions, discharge instructions, and follow-up was discussed with the patient before discharge.     Discharge Medication List as of 07/28/2013  2:40 AM       Final impressions: 1. Back pain       [address]       ,    Lucila [place], PA-C 07/29/13 [phone]

## 2013-07-27 NOTE — ED Notes (Signed)
History of L4 L5 DJD

## 2013-07-28 LAB — URINALYSIS, ROUTINE W REFLEX MICROSCOPIC
Bilirubin Urine: NEGATIVE
Glucose, UA: NEGATIVE mg/dL
Hgb urine dipstick: NEGATIVE
Ketones, ur: NEGATIVE mg/dL
Leukocytes, UA: NEGATIVE
Nitrite: NEGATIVE
Protein, ur: NEGATIVE mg/dL
Specific Gravity, Urine: 1.018 (ref 1.005–1.030)
Urobilinogen, UA: 0.2 mg/dL (ref 0.0–1.0)
pH: 7.5 (ref 5.0–8.0)

## 2013-07-28 NOTE — Discharge Instructions (Signed)
- Return to the emergency department if you develop any changing/worsening condition, loss of bowel/bladder function, weakness, fever, loss of sensation or any other concerns (please read additional information regarding your condition below)  Back Pain, Adult Low back pain is very common. About 1 in 5 people have back pain.The cause of low back pain is rarely dangerous. The pain often gets better over time.About half of people with a sudden onset of back pain feel better in just 2 weeks. About 8 in 10 people feel better by 6 weeks.  CAUSES Some common causes of back pain include:  Strain of the muscles or ligaments supporting the spine.  Wear and tear (degeneration) of the spinal discs.  Arthritis.  Direct injury to the back. DIAGNOSIS Most of the time, the direct cause of low back pain is not known.However, back pain can be treated effectively even when the exact cause of the pain is unknown.Answering your caregiver's questions about your overall health and symptoms is one of the most accurate ways to make sure the cause of your pain is not dangerous. If your caregiver needs more information, he or she may order lab work or imaging tests (X-rays or MRIs).However, even if imaging tests show changes in your back, this usually does not require surgery. HOME CARE INSTRUCTIONS For many people, back pain returns.Since low back pain is rarely dangerous, it is often a condition that people can learn to [facility] their own.   Remain active. It is stressful on the back to sit or stand in one place. Do not sit, drive, or stand in one place for more than 30 minutes at a time. Take short walks on level surfaces as soon as pain allows.Try to increase the length of time you walk each day.  Do not stay in bed.Resting more than 1 or 2 days can delay your recovery.  Do not avoid exercise or work.Your body is made to move.It is not dangerous to be active, even though your back may hurt.Your back  will likely heal faster if you return to being active before your pain is gone.  Pay attention to your body when you bend and lift. Many people have less discomfortwhen lifting if they bend their knees, keep the load close to their bodies,and avoid twisting. Often, the most comfortable positions are those that put less stress on your recovering back.  Find a comfortable position to sleep. Use a firm mattress and lie on your side with your knees slightly bent. If you lie on your back, put a pillow under your knees.  Only take over-the-counter or prescription medicines as directed by your caregiver. Over-the-counter medicines to reduce pain and inflammation are often the most helpful.Your caregiver may prescribe muscle relaxant drugs.These medicines help dull your pain so you can more quickly return to your normal activities and healthy exercise.  Put ice on the injured area.  Put ice in a plastic bag.  Place a towel between your skin and the bag.  Leave the ice on for 15-20 minutes, 03-04 times a day for the first 2 to 3 days. After that, ice and heat may be alternated to reduce pain and spasms.  Ask your caregiver about trying back exercises and gentle massage. This may be of some benefit.  Avoid feeling anxious or stressed.Stress increases muscle tension and can worsen back pain.It is important to recognize when you are anxious or stressed and learn ways to manage it.Exercise is a great option. SEEK MEDICAL CARE IF:  You  have pain that is not relieved with rest or medicine.  You have pain that does not improve in 1 week.  You have new symptoms.  You are generally not feeling well. SEEK IMMEDIATE MEDICAL CARE IF:   You have pain that radiates from your back into your legs.  You develop new bowel or bladder control problems.  You have unusual weakness or numbness in your arms or legs.  You develop nausea or vomiting.  You develop abdominal pain.  You feel  faint. Document Released: 04/04/2005 Document Revised: 10/04/2011 Document Reviewed: 08/23/2010 [facility] Patient Information 2014 [place], [place].   Emergency Department Resource Guide 1) Find a Doctor and Pay Out of Pocket Although you won't have to find out who is covered by your insurance plan, it is a good idea to ask around and get recommendations. You will then need to call the office and see if the doctor you have chosen will accept you as a new patient and what types of options they offer for patients who are self-pay. Some doctors offer discounts or will set up payment plans for their patients who do not have insurance, but you will need to ask so you aren't surprised when you get to your appointment.  2) Contact Your Local Health Department Not all health departments have doctors that can see patients for sick visits, but many do, so it is worth a call to see if yours does. If you don't know where your local health department is, you can check in your phone book. The CDC also has a tool to help you locate your state's health department, and many state websites also have listings of all of their local health departments.  3) Find a [facility] If your illness is not likely to be very severe or complicated, you may want to try a walk in clinic. These are popping up all over the country in pharmacies, drugstores, and shopping centers. They're usually staffed by nurse practitioners or physician assistants that have been trained to treat common illnesses and complaints. They're usually fairly quick and inexpensive. However, if you have serious medical issues or chronic medical problems, these are probably not your best option.  No Primary Care Doctor: - Call Health Connect at  [phone] - they can help you locate a primary care doctor that  accepts your insurance, provides certain services, etc. - Physician Referral Service- [phone]  Chronic Pain Problems: Organization          Address  Phone   Notes  [facility]  [phone] Patients need to be referred by their primary care doctor.   Medication Assistance: Organization         Address  Phone   Notes  [facility] Medication [facility] [address] [place], [place] [address] [phone] --Must be a resident of [facility] -- Must have NO insurance coverage whatsoever (no Medicaid/ Medicare, etc.) -- The pt. MUST have a primary care doctor that directs their care regularly and follows them in the community   MedAssist  [phone]   [employer]  [phone]    Agencies that provide inexpensive medical care: Organization         Address  Phone   Notes  [place]  [phone]   [facility]    [phone]   [facility] [place], [place] [address] [phone]   [facility] [place] [address] [place] [phone]   Planned Parenthood    [phone]   [facility]    [phone]   [employer] and [place] [address], [place] Phone:  [phone], Fax:  [phone] Hours of Operation:  9 am - 6 pm, M-F.  Also accepts Medicaid/Medicare and self-pay.  [facility] for [place] [address], [address], [place] Phone: [phone], Fax: [phone]. Hours of Operation:  8:30 am - 5:30 pm, M-F.  Also accepts Medicaid and self-pay.  [facility] [address] [place] Phone: [phone]   [place], [place], [place] [phone], Ext. [phone] Mondays & Thursdays: 7-9 AM.  First 15 patients are seen on a first come, first serve basis.    [place] Providers:  Organization         Address  Phone   Notes  [facility] [address], [place] [phone] Also accepts self-pay patients.  [facility] [address], [place]  [phone]   [place], [address], [place] [phone]   [facility] [address], [place] [phone]   Lucianne Lei [address], [place]   [phone] Only accepts [place] Access [place] patients after they have their name applied to their card.   Self-Pay (no insurance) in [facility]:  Organization         Address  Phone   Notes  Sickle Cell Patients, [facility] [place] [phone]   [facility] [place] [phone]   [facility] [place]  [place], [place], [place] [phone]   [facility]/Dr. Osei-Bonsu  [address] [place] or [address], [place] [phone] Phone number for both [place] and [place] locations is the same.  [facility] [address] [place] [phone]   [facility] [address] [place] or [address] [phone]   [facility] [address], [place] [phone], phone; [phone], fax Sees patients 1st and 3rd Saturday of every month.  Must not qualify for public or private insurance (i.e. Medicaid, Medicare, [place] Health Choice, Veterans' Benefits)  Household income should be no more than 200% of the poverty level The clinic cannot treat you if you are pregnant or think you are pregnant  Sexually transmitted diseases are not treated at the clinic.    Dental Care: Organization         Address  Phone  Notes  [facility] [place] [phone] Accepts children up to age 75 who are enrolled in [place] or [place]; pregnant women with a Medicaid card; and  children who have applied for Medicaid or [place] Health Choice, but were declined, whose parents can pay a reduced fee at time of service.  [facility]  [address], [place] [phone] Accepts children up to age 73 who are enrolled in [place] or [place]; pregnant women with a Medicaid card; and children who have  applied for Medicaid or [place] Health Choice, but were declined, whose parents can pay a reduced fee at time of service.  [place] Adult Dental Access PROGRAM  [place] [phone] Patients are seen by appointment only. Walk-ins are not accepted. [place] will see patients 74 years of age and older. Monday - Tuesday (8am-5pm) Most Wednesdays (8:30-5pm) $30 per visit, cash only  [facility] Adult Dental Access PROGRAM  [address], [facility] [phone] Patients are seen by appointment only. Walk-ins are not accepted. [place] will see patients 41 years of age and older. One Wednesday Evening (Monthly: Volunteer Based).  $30 per visit, cash only  [place]  [phone] for adults; Children under age 74, call Graduate Pediatric Dentistry at [phone]. Children aged 50-14, please call [phone] to request a pediatric application.  Dental services are provided in all areas of dental care including fillings, crowns and bridges, complete and partial dentures, implants, gum treatment, root canals, and extractions. Preventive care is also provided. Treatment is provided to both adults and children. Patients are selected via a lottery and there is often a waiting list.   [facility] [address], [place]  [phone] [email or web address]   [facility] [address] [place], [place] [phone], Ext. [phone] Second and Fourth Thursday of each month, opens at 6:30 AM; Clinic ends at 9 AM.  Patients are seen on a first-come first-served  basis, and a limited number are seen during each clinic.   [facility]  [address] Hillard Danker [place], [place] [phone]   Eligibility Requirements You must have lived in [place], [place], or [place] counties for at least the last three months.   You cannot be eligible for state or federal sponsored [employer], including [employer], [place], or [employer].   You generally cannot be eligible for healthcare insurance through your employer.    How to apply: Eligibility screenings are held every Tuesday and Wednesday afternoon from 1:00 pm until 4:00 pm. You do not need an appointment for the interview!  [facility] [address] [place], [place]   [place]  [place] Department  [place]  [phone]    Behavioral Health Resources in the Community: Intensive Outpatient Programs Organization         Address  Phone  Notes  [facility] [place]. [address], [place], [place] [phone]   [facility] Outpatient [address], [place], [place]   ADS: Alcohol & Drug S[facility] [address] [place], [place]   [place] [address],  [place], [place] or [phone]   Substance Abuse Resources Organization         Address  Phone  Notes  Alcohol and Drug Services  [phone]   [place]  [phone]   [facility] [place]   Chinita Pester  [phone]   Residential & Outpatient Substance Abuse Program  [phone]   Psychological Services Organization         Address  Phone  Notes  [facility] [place]  [place]  [phone]   [place] [address], [place] or [phone]    Mobile Crisis Teams Organization  Address  Phone  Notes  [facility], Mobile Crisis Care Unit  [phone]   [facility] Psych[facility]  [address] [place], [place]   [facility] [address], [place] [phone]    Self-Help/Support Groups Organization         Address  Phone             Notes  [place]. of [place] - variety of support groups  [place] Call for more information  Narcotics Anonymous (NA), [facility] [address], [employer] [place]  2 meetings at this location   Special educational needs teacher         Address  Phone  Notes  [facility] [place],    [place]  [phone]   [facility]  [address], [place] [phone], [place], [place]   [place], [place] [phone] Admissions: 8am-3pm M-F  Incentives Substance [place] [address]    [place], [place] [phone]   [facility] [address] [place], [place], [place]   [facility] [address]  [place], [place]   [facility] - Intensive Outpatient [address] Kristeen [address], [place], [place]   [facility] ([place]  [place], [place] [phone] or [phone]   [facility] ([facility]) [address], [place], [place] Accepts Medicaid  [facility] [address],  [place] [phone] Substance Abuse/Addiction Treatment   [facility] Organization         Address  Phone  Notes  [facility]  [phone]   Domenic Schwab, PhD [address] [place], [place]   [phone] or [phone]   [place], [place] [phone]   [facility] [address] [place], [place] [phone] Insurance/Medicaid/sponsorship  through [facility] and Families [address], [address]                                    [place], [place] [phone] [place] [address] [place], [place] [phone]    Dr. Adele Schilder  [phone]   [facility] Dept. 1) [address], [place] 2) [place] 3)  [place] [address], [place] [phone]  [phone]   [place] [phone] or [phone] (After Hours)

## 2013-07-28 NOTE — ED Notes (Signed)
Pt has made multiple attempts to provide urine specimen and has not been successful. Will continue to monitor. RN made aware

## 2013-07-28 NOTE — ED Notes (Signed)
Pt ambulatory to bathroom without assistance.

## 2013-07-31 NOTE — ED Provider Notes (Signed)
Medical screening examination/treatment/procedure(s) were performed by non-physician practitioner and as supervising physician I was immediately available for consultation/collaboration.    Kathalene Frames, MD 07/31/13 [phone]

## 2013-08-12 ENCOUNTER — Ambulatory Visit: Payer: Self-pay | Admitting: Nurse Practitioner

## 2013-08-14 ENCOUNTER — Ambulatory Visit: Payer: Self-pay | Admitting: Nurse Practitioner

## 2013-09-15 ENCOUNTER — Other Ambulatory Visit: Payer: Self-pay | Admitting: Diagnostic Neuroimaging

## 2013-10-23 ENCOUNTER — Ambulatory Visit: Payer: Self-pay | Admitting: Diagnostic Neuroimaging

## 2013-11-10 ENCOUNTER — Encounter (HOSPITAL_COMMUNITY): Payer: Self-pay | Admitting: Emergency Medicine

## 2013-11-10 ENCOUNTER — Emergency Department (HOSPITAL_COMMUNITY)
Admission: EM | Admit: 2013-11-10 | Discharge: 2013-11-10 | Disposition: A | Discharge status: Home / Self Care | Payer: Medicare Other | Attending: Emergency Medicine | Admitting: Emergency Medicine

## 2013-11-10 DIAGNOSIS — Z87448 Personal history of other diseases of urinary system: Secondary | ICD-10-CM | POA: Insufficient documentation

## 2013-11-10 DIAGNOSIS — F411 Generalized anxiety disorder: Secondary | ICD-10-CM | POA: Insufficient documentation

## 2013-11-10 DIAGNOSIS — Z8719 Personal history of other diseases of the digestive system: Secondary | ICD-10-CM | POA: Insufficient documentation

## 2013-11-10 DIAGNOSIS — Z79899 Other long term (current) drug therapy: Secondary | ICD-10-CM | POA: Diagnosis not present

## 2013-11-10 DIAGNOSIS — F329 Major depressive disorder, single episode, unspecified: Secondary | ICD-10-CM | POA: Insufficient documentation

## 2013-11-10 DIAGNOSIS — Z8614 Personal history of Methicillin resistant Staphylococcus aureus infection: Secondary | ICD-10-CM | POA: Insufficient documentation

## 2013-11-10 DIAGNOSIS — G43909 Migraine, unspecified, not intractable, without status migrainosus: Secondary | ICD-10-CM | POA: Insufficient documentation

## 2013-11-10 DIAGNOSIS — F3289 Other specified depressive episodes: Secondary | ICD-10-CM | POA: Diagnosis not present

## 2013-11-10 DIAGNOSIS — F17200 Nicotine dependence, unspecified, uncomplicated: Secondary | ICD-10-CM | POA: Insufficient documentation

## 2013-11-10 DIAGNOSIS — G40909 Epilepsy, unspecified, not intractable, without status epilepticus: Secondary | ICD-10-CM | POA: Diagnosis not present

## 2013-11-10 DIAGNOSIS — G43009 Migraine without aura, not intractable, without status migrainosus: Secondary | ICD-10-CM | POA: Insufficient documentation

## 2013-11-10 DIAGNOSIS — Z9889 Other specified postprocedural states: Secondary | ICD-10-CM | POA: Insufficient documentation

## 2013-11-10 MED ORDER — METOCLOPRAMIDE HCL 5 MG/ML IJ SOLN
10.0000 mg | Freq: Once | INTRAMUSCULAR | Status: AC
  Administered 2013-11-10: 10 mg via INTRAVENOUS
  Filled 2013-11-10: qty 2

## 2013-11-10 MED ORDER — DIPHENHYDRAMINE HCL 50 MG/ML IJ SOLN
50.0000 mg | Freq: Once | INTRAMUSCULAR | Status: AC
  Administered 2013-11-10: 50 mg via INTRAVENOUS
  Filled 2013-11-10: qty 1

## 2013-11-10 MED ORDER — KETOROLAC TROMETHAMINE 30 MG/ML IJ SOLN
30.0000 mg | Freq: Once | INTRAMUSCULAR | Status: AC
  Administered 2013-11-10: 30 mg via INTRAVENOUS
  Filled 2013-11-10: qty 1

## 2013-11-10 MED ORDER — SODIUM CHLORIDE 0.9 % IV BOLUS (SEPSIS)
1000.0000 mL | Freq: Once | INTRAVENOUS | Status: AC
  Administered 2013-11-10: 1000 mL via INTRAVENOUS

## 2013-11-10 NOTE — ED Provider Notes (Signed)
CSN: [id number]     Arrival date & time 11/10/13  1646 History   First MD Initiated Contact with Patient 11/10/13 1832     Chief Complaint  Patient presents with  . Migraine     (Consider location/radiation/quality/duration/timing/severity/associated sxs/prior Treatment) HPI  63 year old female with history of seizure, depression, anxiety, possibly the use presents complaining of headache. Patient reports gradual onset of persistent sharp throbbing headache to the top of her head ongoing for the past week. Headache is with moderate intensity, including light and sound sensitivity, and worsen with heat. Patient took over-the-counter medication including Tylenol, ibuprofen, Aleve with minimal relief. She denies any fever, vision changes, neck stiffness, chest pain, shortness of breath, abdominal pain, nausea vomiting diarrhea, numbness, weakness, or rash. She does not normally have headaches. She denies any history of cancer, or weight changes, or night sweats. She called her neurologist who told her that she can follow up on Monday and felt this is likely migraine. She was told to avoid caffeine which she has been doing with minimal relief. Her headache has been persistent, not getting any better or worse.  Past Medical History  Diagnosis Date  . Seizures   . Gastric ulcer   . MRSA (methicillin resistant staph aureus) culture positive   . Interstitial cystitis   . Depression   . Mood disorder   . Anxiety    Past Surgical History  Procedure Laterality Date  . Shoulder arthroscopy    . Rotator cuff repair      R side  . Cataract extraction    . Multiple tooth extractions     No family history on file. History  Substance Use Topics  . Smoking status: Current Every Day Smoker  . Smokeless tobacco: Not on file  . Alcohol Use: No   OB History   Grav Para Term Preterm Abortions TAB SAB Ect Mult Living                 Review of Systems  All other systems reviewed and are  negative.     Allergies  Haloperidol decanoate and Zithromax  Home Medications   Prior to Admission medications   Medication Sig Start Date End Date Taking? Authorizing Provider  ALPRAZolam Duanne Moron) 0.5 MG tablet Take 0.5 mg by mouth at bedtime as needed (restless leg).    Historical Provider, MD  beta carotene w/minerals (OCUVITE) tablet Take 1 tablet by mouth daily.    Historical Provider, MD  Cholecalciferol (VITAMIN D3) 2000 UNITS TABS Take 4,000-6,000 Units by mouth daily.    Historical Provider, MD  clonazePAM (KLONOPIN) 0.5 MG tablet Take 0.5 mg by mouth 2 (two) times daily as needed for anxiety.     Historical Provider, MD  diphenhydrAMINE (BENADRYL) 25 MG tablet Take 25 mg by mouth at bedtime as needed (sleep).    Historical Provider, MD  lamoTRIgine (LAMICTAL) 100 MG tablet TAKE 1 TABLET EVERY MORNING AND TAKE 2 TABLETS EVERY EVENING AS DIRECTED    Penni Bombard, MD  Multiple Vitamin (MULTIVITAMIN WITH MINERALS) TABS Take 1 tablet by mouth daily.    Historical Provider, MD  risperiDONE (RISPERDAL) 1 MG tablet Take 2 mg by mouth 2 (two) times daily.    Historical Provider, MD  simvastatin (ZOCOR) 20 MG tablet Take 20 mg by mouth every evening.    Historical Provider, MD  varenicline (CHANTIX) 1 MG tablet Take 1 mg by mouth 2 (two) times daily.    Historical Provider, MD   BP [phone]  Pulse 67  Temp(Src) 98.5 F (36.9 C) (Oral)  Resp 18  SpO2 100% Physical Exam  Nursing note and vitals reviewed. Constitutional: She appears well-developed and well-nourished. No distress.  HENT:  Head: Atraumatic.  Mouth/Throat: Oropharynx is clear and moist.  Eyes: Conjunctivae and EOM are normal. Pupils are equal, round, and reactive to light.  Neck: Normal range of motion. Neck supple.  No nuchal rigidity  Neurological: She is alert.  Neurologic exam:  Speech clear, pupils equal round reactive to light, extraocular movements intact  Normal peripheral visual fields Cranial nerves  III through XII normal including no facial droop Follows commands, moves all extremities x4, normal strength to bilateral upper and lower extremities at all major muscle groups including grip Sensation normal to light touch  Coordination intact, no limb ataxia, finger-nose-finger normal Rapid alternating movements normal No pronator drift Gait normal   Skin: No rash noted.  Psychiatric: She has a normal mood and affect.    ED Course  Procedures (including critical care time)  6:48 PM Patient has a week long headache. No sudden onset thunderclap headache concerning for subarachnoid hemorrhage, no fever nuchal rigidity concerning for meningitis, no focal neuro deficits concerning for stroke. Patient will be seen by her neurologist on Monday which is tomorrow. Plan to provide a migraine cocktail and continue to monitor. Patient otherwise afebrile with stable normal vital sign. Care discussed with Dr. Vanita Panda.    8:35 PM Patient reporting headache is much better and requests to be discharged. Patient is stable for discharge. She is in today without any difficulty. She will followup with her neurologist tomorrow.   Labs Review Labs Reviewed - No data to display  Imaging Review No results found.   EKG Interpretation None      MDM   Final diagnoses:  Migraine without aura and without status migrainosus, not intractable    BP 98/68  Pulse 67  Temp(Src) 98.5 F (36.9 C) (Oral)  Resp 18  SpO2 100%     Domenic Moras, PA-C 11/10/13 2037

## 2013-11-10 NOTE — Discharge Instructions (Signed)

## 2013-11-10 NOTE — ED Notes (Signed)
Pt states that she sees a neurologist for seizures.  States that she has had a migraine that started 1 wk ago.  Denies vomiting or diarrhea.  Called her neurologist and told her to UC but she could not go there because she needs to pay up front.

## 2013-11-11 ENCOUNTER — Telehealth: Payer: Self-pay | Admitting: Diagnostic Neuroimaging

## 2013-11-11 NOTE — ED Provider Notes (Signed)
Medical screening examination/treatment/procedure(s) were performed by non-physician practitioner and as supervising physician I was immediately available for consultation/collaboration.     Carmin Muskrat, MD 11/11/13 1536

## 2013-11-11 NOTE — Telephone Encounter (Signed)
Patient called and stated she's dealing with a migraine, which keeps returning since last week.  Please advise as to what she should do to help with pain.  Thanks

## 2013-11-12 ENCOUNTER — Telehealth: Payer: Self-pay | Admitting: Diagnostic Neuroimaging

## 2013-11-12 NOTE — Telephone Encounter (Signed)
Spoke with patient and explained that she has not been seen since 12/14/11, and not for headaches, was seen for seizures, suggested to patient to f/u with pcp, and if he decides she needs to be seen by Neurologist for the headaches, send a referral to our office, will gladly schedule an appt. She verbalized understanding.she still wanted advice on anything to take for the headaches , informed once again to call pcp for those questions and she said that she would.

## 2013-11-12 NOTE — Telephone Encounter (Signed)
Called and left VM message for return call

## 2013-11-12 NOTE — Telephone Encounter (Signed)
Patient calling back as she missed a call from [place]. She is available to pick up when called now.

## 2013-11-13 ENCOUNTER — Encounter (HOSPITAL_COMMUNITY): Payer: Self-pay | Admitting: Emergency Medicine

## 2013-11-13 ENCOUNTER — Emergency Department (HOSPITAL_COMMUNITY)
Admission: EM | Admit: 2013-11-13 | Discharge: 2013-11-13 | Disposition: A | Discharge status: Home / Self Care | Payer: Medicare Other | Attending: Emergency Medicine | Admitting: Emergency Medicine

## 2013-11-13 DIAGNOSIS — H53149 Visual discomfort, unspecified: Secondary | ICD-10-CM | POA: Insufficient documentation

## 2013-11-13 DIAGNOSIS — Z8719 Personal history of other diseases of the digestive system: Secondary | ICD-10-CM | POA: Diagnosis not present

## 2013-11-13 DIAGNOSIS — Z79899 Other long term (current) drug therapy: Secondary | ICD-10-CM | POA: Diagnosis not present

## 2013-11-13 DIAGNOSIS — Z7982 Long term (current) use of aspirin: Secondary | ICD-10-CM | POA: Insufficient documentation

## 2013-11-13 DIAGNOSIS — R519 Headache, unspecified: Secondary | ICD-10-CM

## 2013-11-13 DIAGNOSIS — R51 Headache: Secondary | ICD-10-CM | POA: Insufficient documentation

## 2013-11-13 DIAGNOSIS — F3289 Other specified depressive episodes: Secondary | ICD-10-CM | POA: Diagnosis not present

## 2013-11-13 DIAGNOSIS — F329 Major depressive disorder, single episode, unspecified: Secondary | ICD-10-CM | POA: Insufficient documentation

## 2013-11-13 DIAGNOSIS — F17200 Nicotine dependence, unspecified, uncomplicated: Secondary | ICD-10-CM | POA: Insufficient documentation

## 2013-11-13 DIAGNOSIS — Z791 Long term (current) use of non-steroidal anti-inflammatories (NSAID): Secondary | ICD-10-CM | POA: Diagnosis not present

## 2013-11-13 DIAGNOSIS — F411 Generalized anxiety disorder: Secondary | ICD-10-CM | POA: Insufficient documentation

## 2013-11-13 DIAGNOSIS — Z87448 Personal history of other diseases of urinary system: Secondary | ICD-10-CM | POA: Diagnosis not present

## 2013-11-13 DIAGNOSIS — G40909 Epilepsy, unspecified, not intractable, without status epilepticus: Secondary | ICD-10-CM | POA: Diagnosis not present

## 2013-11-13 DIAGNOSIS — Z8614 Personal history of Methicillin resistant Staphylococcus aureus infection: Secondary | ICD-10-CM | POA: Diagnosis not present

## 2013-11-13 MED ORDER — VALPROATE SODIUM 500 MG/5ML IV SOLN
500.0000 mg | Freq: Once | INTRAVENOUS | Status: AC
  Administered 2013-11-13: 500 mg via INTRAVENOUS
  Filled 2013-11-13: qty 5

## 2013-11-13 MED ORDER — DEXAMETHASONE SODIUM PHOSPHATE 10 MG/ML IJ SOLN
10.0000 mg | Freq: Once | INTRAMUSCULAR | Status: AC
  Administered 2013-11-13: 10 mg via INTRAVENOUS
  Filled 2013-11-13: qty 1

## 2013-11-13 MED ORDER — BUTALBITAL-APAP-CAFFEINE 50-325-40 MG PO TABS: 1.0000 | ORAL_TABLET | Freq: Three times a day (TID) | ORAL | Status: DC

## 2013-11-13 MED ORDER — MAGNESIUM SULFATE 50 % IJ SOLN: 2.0000 g | Freq: Once | INTRAMUSCULAR | Status: DC

## 2013-11-13 MED ORDER — MAGNESIUM SULFATE 40 MG/ML IJ SOLN
2.0000 g | Freq: Once | INTRAMUSCULAR | Status: AC
  Administered 2013-11-13: 2 g via INTRAVENOUS
  Filled 2013-11-13: qty 50

## 2013-11-13 NOTE — ED Notes (Signed)
She states she is beginning to relax and her headache is beginning to abate.  She remains oriented x 4 with clear speech.

## 2013-11-13 NOTE — ED Notes (Signed)
PA at [facility]

## 2013-11-13 NOTE — ED Provider Notes (Signed)
CSN: [id number]     Arrival date & time 11/13/13  1655 History   First MD Initiated Contact with Patient 11/13/13 1708     Chief Complaint  Patient presents with  . Headache   HPI  History provided by the patient and recent medical chart. He is a 63 year old seen now with history of migraine headache, anxiety, depression and seizure disorder who presents with unrelieved headache. Patient reports having a headache for the last 10 days. It is located across the bilateral frontal forehead and on the top of the head. The pain is dull. This was not sudden or severe on onset. She reports coming to the emergency room last Sunday receiving medications through an IV. She did have some temporary relief but was feeling optimistic he returned home however her headache returned. She followed up with her PCP, Dr. blunt today who told her to come back to the emergency room for treatment of her headache as he was unable to provide any treatment in the clinic. She does also have a followup appointment with her neurologist, Dr. Leta Baptist next Monday. She has used several over-the-counter medications without any relief. She does has history of similar migraine headaches but states they usually go way with Advil or aspirin. She denies any other associated symptoms. No fever, chills or sweats. No weakness or numbness in extremities. No confusion or speech change.    Past Medical History  Diagnosis Date  . Seizures   . Gastric ulcer   . MRSA (methicillin resistant staph aureus) culture positive   . Interstitial cystitis   . Depression   . Mood disorder   . Anxiety    Past Surgical History  Procedure Laterality Date  . Shoulder arthroscopy    . Rotator cuff repair      R side  . Cataract extraction    . Multiple tooth extractions     No family history on file. History  Substance Use Topics  . Smoking status: Current Every Day Smoker  . Smokeless tobacco: Not on file  . Alcohol Use: No   OB History   Grav Para Term Preterm Abortions TAB SAB Ect Mult Living                 Review of Systems  Constitutional: Negative for fever, chills and diaphoresis.  Eyes: Positive for photophobia.  Neurological: Positive for headaches. Negative for speech difficulty, weakness and numbness.  Psychiatric/Behavioral: Negative for confusion.  All other systems reviewed and are negative.     Allergies  Haloperidol decanoate and Zithromax  Home Medications   Prior to Admission medications   Medication Sig Start Date End Date Taking? Authorizing Provider  acetaminophen (TYLENOL) 500 MG tablet Take 1,000 mg by mouth every 6 (six) hours as needed for headache.   Yes Historical Provider, MD  aspirin 325 MG tablet Take by mouth daily as needed for headache.    Yes Historical Provider, MD  lamoTRIgine (LAMICTAL) 100 MG tablet Take 100-200 mg by mouth daily. 100 mg in the am  And 200 mg at bedtime   Yes Historical Provider, MD  naproxen sodium (ANAPROX) 220 MG tablet Take by mouth 2 (two) times daily with a meal.   Yes Historical Provider, MD  simvastatin (ZOCOR) 20 MG tablet Take 20 mg by mouth every evening.   Yes Historical Provider, MD   BP 111/62  Pulse 66  Temp(Src) 98.5 F (36.9 C) (Oral)  Resp 18  SpO2 95% Physical Exam  Nursing note and  vitals reviewed. Constitutional: She is oriented to person, place, and time. She appears well-developed and well-nourished. No distress.  HENT:  Head: Normocephalic and atraumatic.  Eyes: Conjunctivae and EOM are normal. Pupils are equal, round, and reactive to light.  Neck: Normal range of motion. Neck supple.  No meningeal signs  Cardiovascular: Normal rate and regular rhythm.   No murmur heard. Pulmonary/Chest: Effort normal and breath sounds normal. No respiratory distress. She has no wheezes. She has no rales.  Abdominal: Soft. There is no tenderness. There is no rigidity, no rebound, no guarding, no CVA tenderness and no tenderness at McBurney's  point.  Neurological: She is alert and oriented to person, place, and time. She has normal strength. No cranial nerve deficit or sensory deficit. Gait normal.  Skin: Skin is warm and dry. No rash noted.  Psychiatric: She has a normal mood and affect. Her behavior is normal.    ED Course  Procedures   COORDINATION OF CARE:  Nursing notes reviewed. Vital signs reviewed. Initial pt interview and examination performed.   Filed Vitals:   11/13/13 1707  BP: 111/62  Pulse: 66  Temp: 98.5 F (36.9 C)  TempSrc: Oral  Resp: 18  SpO2: 95%    5:13 PM-patient seen and evaluated. She appears well. Does not appear in severe discomfort. Does not appear severely ill or toxic. Normal nonfocal neuro exam. No concerning or red flag symptoms for the headache.  7:30 PM patient does report feeling some improvements of her headache. She is still receiving Depakote. Will continue to monitor.  8:30PM patient has now received all of the Depakote. She reports relief of her headache and is ready to return home. She has good followup plan.  Treatment plan initiated: Medications  valproate (DEPACON) 500 mg in dextrose 5 % 50 mL IVPB (500 mg Intravenous New Bag/Given 11/13/13 1846)  dexamethasone (DECADRON) injection 10 mg (10 mg Intravenous Given 11/13/13 1802)  magnesium sulfate IVPB 2 g 50 mL (0 g Intravenous Stopped 11/13/13 1845)     MDM   Final diagnoses:  Nonintractable headache, unspecified chronicity pattern, unspecified headache type       Martie Lee, PA-C 11/13/13 2040

## 2013-11-13 NOTE — ED Notes (Addendum)
She c/o frontal and pate area h/a since "last Monday".  She states she has never had a h/a last this long, nor has she ever had this type of h/a.  She saw Dr. Kennon Holter today, who advised her to come to E.D. For eval.  She denies fever/n/v/d, nor any sign of recent illness.  She is oriented x 4 with clear speech.  PEARL at 75mm with bilat. I.O.L. Noted.

## 2013-11-13 NOTE — Telephone Encounter (Signed)
Called left vmail. Not showing any other call other than Ammie's last call informing to contact PCP for referral regarding new problem.

## 2013-11-13 NOTE — Discharge Instructions (Signed)
Please followup with your primary care provider and neurology specialist as planned.    Migraine Headache A migraine headache is an intense, throbbing pain on one or both sides of your head. A migraine can last for 30 minutes to several hours. CAUSES  The exact cause of a migraine headache is not always known. However, a migraine may be caused when nerves in the brain become irritated and release chemicals that cause inflammation. This causes pain. Certain things may also trigger migraines, such as:  Alcohol.  Smoking.  Stress.  Menstruation.  Aged cheeses.  Foods or drinks that contain nitrates, glutamate, aspartame, or tyramine.  Lack of sleep.  Chocolate.  Caffeine.  Hunger.  Physical exertion.  Fatigue.  Medicines used to treat chest pain (nitroglycerine), birth control pills, estrogen, and some blood pressure medicines. SIGNS AND SYMPTOMS  Pain on one or both sides of your head.  Pulsating or throbbing pain.  Severe pain that prevents daily activities.  Pain that is aggravated by any physical activity.  Nausea, vomiting, or both.  Dizziness.  Pain with exposure to bright lights, loud noises, or activity.  General sensitivity to bright lights, loud noises, or smells. Before you get a migraine, you may get warning signs that a migraine is coming (aura). An aura may include:  Seeing flashing lights.  Seeing bright spots, halos, or zigzag lines.  Having tunnel vision or blurred vision.  Having feelings of numbness or tingling.  Having trouble talking.  Having muscle weakness. DIAGNOSIS  A migraine headache is often diagnosed based on:  Symptoms.  Physical exam.  A CT scan or MRI of your head. These imaging tests cannot diagnose migraines, but they can help rule out other causes of headaches. TREATMENT Medicines may be given for pain and nausea. Medicines can also be given to help prevent recurrent migraines.  HOME CARE INSTRUCTIONS  Only  take over-the-counter or prescription medicines for pain or discomfort as directed by your health care provider. The use of long-term narcotics is not recommended.  Lie down in a dark, quiet room when you have a migraine.  Keep a journal to find out what may trigger your migraine headaches. For example, write down:  What you eat and drink.  How much sleep you get.  Any change to your diet or medicines.  Limit alcohol consumption.  Quit smoking if you smoke.  Get 7-9 hours of sleep, or as recommended by your health care provider.  Limit stress.  Keep lights dim if bright lights bother you and make your migraines worse. SEEK IMMEDIATE MEDICAL CARE IF:   Your migraine becomes severe.  You have a fever.  You have a stiff neck.  You have vision loss.  You have muscular weakness or loss of muscle control.  You start losing your balance or have trouble walking.  You feel faint or pass out.  You have severe symptoms that are different from your first symptoms. MAKE SURE YOU:   Understand these instructions.  Will watch your condition.  Will get help right away if you are not doing well or get worse. Document Released: 04/04/2005 Document Revised: 08/19/2013 Document Reviewed: 12/10/2012 [facility] Patient Information 2015 [place], [place]. This information is not intended to replace advice given to you by your health care provider. Make sure you discuss any questions you have with your health care provider.

## 2013-11-13 NOTE — ED Notes (Signed)
Pt states that she was here 7/26 for headache.  Was seen and sent home but headache never went away.  States that she got a new referral to her neurologist and has an appt but "I have got to have something for this headache".

## 2013-11-15 ENCOUNTER — Telehealth: Payer: Self-pay | Admitting: Neurology

## 2013-11-15 MED ORDER — PREDNISONE 5 MG PO TABS: ORAL_TABLET | ORAL | Status: DC

## 2013-11-15 NOTE — ED Provider Notes (Signed)
Medical screening examination/treatment/procedure(s) were performed by non-physician practitioner and as supervising physician I was immediately available for consultation/collaboration.   EKG Interpretation None       Virgel Manifold, MD 11/15/13 2223

## 2013-11-15 NOTE — Telephone Encounter (Signed)
I called patient. She has had a headache for almost 2 weeks. She indicates that she usually does not get headaches. She gained some benefit from an injection previously with Decadron and Depacon. I'll give her prednisone Dosepak, she is to be seen in office on 11/18/2013. She has not been seen through our office in over 2 years.

## 2013-11-18 ENCOUNTER — Ambulatory Visit (INDEPENDENT_AMBULATORY_CARE_PROVIDER_SITE_OTHER): Payer: Medicare Other | Admitting: Diagnostic Neuroimaging

## 2013-11-18 ENCOUNTER — Encounter: Payer: Self-pay | Admitting: Diagnostic Neuroimaging

## 2013-11-18 VITALS — BP 101/71 | HR 60 | Temp 97.5°F | Ht 66.0 in | Wt 140.4 lb

## 2013-11-18 DIAGNOSIS — R561 Post traumatic seizures: Secondary | ICD-10-CM | POA: Insufficient documentation

## 2013-11-18 DIAGNOSIS — R519 Headache, unspecified: Secondary | ICD-10-CM | POA: Insufficient documentation

## 2013-11-18 DIAGNOSIS — G40909 Epilepsy, unspecified, not intractable, without status epilepticus: Secondary | ICD-10-CM | POA: Insufficient documentation

## 2013-11-18 DIAGNOSIS — R51 Headache: Secondary | ICD-10-CM

## 2013-11-18 MED ORDER — LAMOTRIGINE 100 MG PO TABS: ORAL_TABLET | ORAL | Status: DC

## 2013-11-18 NOTE — Patient Instructions (Signed)
Eat 3 times per day.   Reduce artificial sweetener and caffeine intake gradually.  Work with [employer] re: anxiety, depression, insomnia.  Stop cigarette and cocaine use.  Increase water intake.  Start regular physical activity program.
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
## 2013-11-19 ENCOUNTER — Other Ambulatory Visit: Payer: Self-pay | Admitting: Diagnostic Neuroimaging

## 2013-11-19 DIAGNOSIS — R569 Unspecified convulsions: Secondary | ICD-10-CM

## 2013-11-25 ENCOUNTER — Other Ambulatory Visit: Payer: Medicare Other

## 2013-12-02 ENCOUNTER — Inpatient Hospital Stay: Admission: RE | Admit: 2013-12-02 | Payer: Medicare Other | Source: Ambulatory Visit

## 2013-12-10 ENCOUNTER — Inpatient Hospital Stay: Admission: RE | Admit: 2013-12-10 | Payer: Medicare Other | Source: Ambulatory Visit

## 2013-12-16 ENCOUNTER — Ambulatory Visit: Payer: Self-pay | Admitting: Diagnostic Neuroimaging

## 2013-12-17 ENCOUNTER — Other Ambulatory Visit: Payer: Self-pay | Admitting: Neurology

## 2013-12-18 ENCOUNTER — Telehealth: Payer: Self-pay

## 2013-12-18 NOTE — Telephone Encounter (Signed)
Patient is requesting a refill on Prednisone taper dose for headache.  Last prescribed by on call doctor Jannifer Franklin) on 11/15/2013.  Would you like to refill?  Please advise.  Thank you.

## 2013-12-18 NOTE — Telephone Encounter (Signed)
We are waiting for MRI brain results. No prednisone for now. -VRP

## 2013-12-18 NOTE — Telephone Encounter (Signed)
I called the patient back, got no answer.  Left message.  

## 2014-01-07 ENCOUNTER — Other Ambulatory Visit: Payer: Medicare Other

## 2014-01-13 ENCOUNTER — Inpatient Hospital Stay: Admission: RE | Admit: 2014-01-13 | Payer: Medicare Other | Source: Ambulatory Visit

## 2014-02-13 ENCOUNTER — Telehealth: Payer: Self-pay | Admitting: Diagnostic Neuroimaging

## 2014-02-13 NOTE — Telephone Encounter (Signed)
Patient called to cancel her upcoming appointment with Jeani Hawking on 02/18/14 due to not having an MRI yet. Patient wants to know if she can have a different test done, such as a CT scan, instead of an MRI, since the last MRI she had "did not sit well" with her. Please call and advise.

## 2014-02-14 NOTE — Telephone Encounter (Signed)
I recommend MRI brain. -VRP

## 2014-02-17 NOTE — Telephone Encounter (Signed)
Spoke to patient. Gave recommendations per Dr. Gladstone Lighter previous note. Patient is requesting Xanax for the MRI. She would like to have it called into her pharmacy because it is easier for her to get transportation to her pharmacy rather than to our office.  She has not scheduled the MRI as of yet.

## 2014-02-18 ENCOUNTER — Ambulatory Visit: Payer: Self-pay | Admitting: Nurse Practitioner

## 2014-02-18 NOTE — Telephone Encounter (Signed)
Patient is aware 

## 2014-02-18 NOTE — Telephone Encounter (Signed)
After MRI is scheduled, then i will send in xanax. -VRP

## 2014-03-05 ENCOUNTER — Encounter: Payer: Self-pay | Admitting: Neurology

## 2014-03-11 ENCOUNTER — Inpatient Hospital Stay: Admission: RE | Admit: 2014-03-11 | Payer: Medicare Other | Source: Ambulatory Visit

## 2014-03-11 ENCOUNTER — Encounter: Payer: Self-pay | Admitting: Neurology

## 2014-04-11 ENCOUNTER — Emergency Department (HOSPITAL_COMMUNITY)
Admission: EM | Admit: 2014-04-11 | Discharge: 2014-04-11 | Disposition: A | Discharge status: Home / Self Care | Payer: Medicare Other | Attending: Emergency Medicine | Admitting: Emergency Medicine

## 2014-04-11 ENCOUNTER — Encounter (HOSPITAL_COMMUNITY): Payer: Self-pay

## 2014-04-11 ENCOUNTER — Emergency Department (HOSPITAL_COMMUNITY): Payer: Medicare Other

## 2014-04-11 DIAGNOSIS — Z720 Tobacco use: Secondary | ICD-10-CM | POA: Insufficient documentation

## 2014-04-11 DIAGNOSIS — F419 Anxiety disorder, unspecified: Secondary | ICD-10-CM | POA: Diagnosis not present

## 2014-04-11 DIAGNOSIS — Z79899 Other long term (current) drug therapy: Secondary | ICD-10-CM | POA: Diagnosis not present

## 2014-04-11 DIAGNOSIS — W108XXA Fall (on) (from) other stairs and steps, initial encounter: Secondary | ICD-10-CM | POA: Diagnosis not present

## 2014-04-11 DIAGNOSIS — Z7952 Long term (current) use of systemic steroids: Secondary | ICD-10-CM | POA: Insufficient documentation

## 2014-04-11 DIAGNOSIS — M545 Low back pain, unspecified: Secondary | ICD-10-CM

## 2014-04-11 DIAGNOSIS — Y998 Other external cause status: Secondary | ICD-10-CM | POA: Diagnosis not present

## 2014-04-11 DIAGNOSIS — Z7982 Long term (current) use of aspirin: Secondary | ICD-10-CM | POA: Diagnosis not present

## 2014-04-11 DIAGNOSIS — Y9289 Other specified places as the place of occurrence of the external cause: Secondary | ICD-10-CM | POA: Diagnosis not present

## 2014-04-11 DIAGNOSIS — Z8742 Personal history of other diseases of the female genital tract: Secondary | ICD-10-CM | POA: Diagnosis not present

## 2014-04-11 DIAGNOSIS — G40909 Epilepsy, unspecified, not intractable, without status epilepticus: Secondary | ICD-10-CM | POA: Diagnosis not present

## 2014-04-11 DIAGNOSIS — W010XXA Fall on same level from slipping, tripping and stumbling without subsequent striking against object, initial encounter: Secondary | ICD-10-CM | POA: Insufficient documentation

## 2014-04-11 DIAGNOSIS — E780 Pure hypercholesterolemia: Secondary | ICD-10-CM | POA: Diagnosis not present

## 2014-04-11 DIAGNOSIS — Y9389 Activity, other specified: Secondary | ICD-10-CM | POA: Insufficient documentation

## 2014-04-11 DIAGNOSIS — Z8614 Personal history of Methicillin resistant Staphylococcus aureus infection: Secondary | ICD-10-CM | POA: Insufficient documentation

## 2014-04-11 DIAGNOSIS — Z8719 Personal history of other diseases of the digestive system: Secondary | ICD-10-CM | POA: Insufficient documentation

## 2014-04-11 DIAGNOSIS — F329 Major depressive disorder, single episode, unspecified: Secondary | ICD-10-CM | POA: Insufficient documentation

## 2014-04-11 DIAGNOSIS — S3992XA Unspecified injury of lower back, initial encounter: Secondary | ICD-10-CM | POA: Insufficient documentation

## 2014-04-11 HISTORY — DX: Pure hypercholesterolemia, unspecified: E78.00

## 2014-04-11 MED ORDER — IBUPROFEN 800 MG PO TABS
800.0000 mg | ORAL_TABLET | Freq: Once | ORAL | Status: AC
  Administered 2014-04-11: 800 mg via ORAL
  Filled 2014-04-11: qty 1

## 2014-04-11 MED ORDER — DICLOXACILLIN SODIUM 500 MG PO CAPS: 500.0000 mg | ORAL_CAPSULE | Freq: Four times a day (QID) | ORAL | Status: DC

## 2014-04-11 MED ORDER — TRAMADOL HCL 50 MG PO TABS: 50.0000 mg | ORAL_TABLET | Freq: Four times a day (QID) | ORAL | Status: DC

## 2014-04-11 MED ORDER — CYCLOBENZAPRINE HCL 10 MG PO TABS: 10.0000 mg | ORAL_TABLET | Freq: Two times a day (BID) | ORAL | Status: DC | PRN

## 2014-04-11 MED ORDER — DICLOFENAC SODIUM 50 MG PO TBEC: 50.0000 mg | DELAYED_RELEASE_TABLET | Freq: Two times a day (BID) | ORAL | Status: DC

## 2014-04-11 MED ORDER — OXYCODONE-ACETAMINOPHEN 5-325 MG PO TABS
1.0000 | ORAL_TABLET | Freq: Once | ORAL | Status: AC
  Administered 2014-04-11: 1 via ORAL
  Filled 2014-04-11: qty 1

## 2014-04-11 NOTE — ED Provider Notes (Signed)
CSN: [id number]     Arrival date & time 04/11/14  1807 History  This chart was scribed for non-physician practitioner Noland Fordyce, PA-C working with Dorie Rank, MD by Rayfield Citizen, ED Scribe. This patient was seen in room WTR5/WTR5 and the patient's care was started at 7:02 PM.    Chief Complaint  Patient presents with  . Fall  . Back Pain   The history is provided by the patient. No language interpreter was used.     HPI Comments: [name] is a 63 y.o. female who presents to the Emergency Department complaining of fall 2 days PTA. Patient reports that she had a simple mechanical fall down some stairs and landed on her back and left side. Pain is aching and sore, constant, 9/10, worse with movement. She attempted to treat her symptoms with rest and tylenol without relief. She denies fevers, nausea, vomiting, numbness/tingling. Denies change in bowel or bladder habits. No hx of back surgeries.  Past Medical History  Diagnosis Date  . Seizures   . Gastric ulcer   . MRSA (methicillin resistant staph aureus) culture positive   . Interstitial cystitis   . Depression   . Mood disorder   . Anxiety   . Acid reflux   . High cholesterol    Past Surgical History  Procedure Laterality Date  . Shoulder arthroscopy    . Rotator cuff repair      R side  . Cataract extraction    . Multiple tooth extractions     Family History  Problem Relation Age of Onset  . Adopted: Yes  . Family history unknown: Yes   History  Substance Use Topics  . Smoking status: Current Every Day Smoker -- 1.00 packs/day for 40 years    Types: Cigarettes  . Smokeless tobacco: Never Used  . Alcohol Use: No   OB History    No data available     Review of Systems  Musculoskeletal: Positive for back pain.  All other systems reviewed and are negative.  Allergies  Haloperidol decanoate and Zithromax  Home Medications   Prior to Admission medications   Medication Sig Start Date End Date Taking?  Authorizing Provider  aspirin 325 MG tablet Take 650 mg by mouth daily as needed for headache.     Historical Provider, MD  cyclobenzaprine (FLEXERIL) 10 MG tablet Take 1 tablet (10 mg total) by mouth 2 (two) times daily as needed for muscle spasms. 04/11/14   Noland Fordyce, PA-C  diclofenac (VOLTAREN) 50 MG EC tablet Take 1 tablet (50 mg total) by mouth 2 (two) times daily. 04/11/14   Noland Fordyce, PA-C  lamoTRIgine (LAMICTAL) 100 MG tablet 100 mg every morning and 200 mg every night 11/18/13   Penni Bombard, MD  predniSONE (DELTASONE) 5 MG tablet Begin taking 6 tablets daily, taper by one tablet daily until off the medication. 11/15/13   Kathrynn Ducking, MD  risperiDONE (RISPERDAL) 2 MG tablet Take 1 tablet by mouth 2 (two) times daily. 10/15/13   Historical Provider, MD  simvastatin (ZOCOR) 20 MG tablet Take 20 mg by mouth every evening.    Historical Provider, MD  traMADol (ULTRAM) 50 MG tablet Take 1 tablet (50 mg total) by mouth 4 (four) times daily. 04/11/14   Noland Fordyce, PA-C  zolpidem (AMBIEN) 5 MG tablet Take 1 tablet by mouth daily. 11/03/13   Historical Provider, MD   BP 100/62 mmHg  Pulse 85  Temp(Src) 97.9 F (36.6 C) (Oral)  Resp  20  SpO2 94% Physical Exam  Constitutional: She appears well-developed and well-nourished. No distress.  HENT:  Head: Normocephalic and atraumatic.  Eyes: Conjunctivae are normal. No scleral icterus.  Neck: Normal range of motion.  Cardiovascular: Normal rate, regular rhythm and normal heart sounds.   Pulmonary/Chest: Effort normal and breath sounds normal. No respiratory distress. She has no wheezes. She has no rales. She exhibits no tenderness.  Abdominal: Soft. Bowel sounds are normal. She exhibits no distension and no mass. There is no tenderness. There is no rebound and no guarding.  Musculoskeletal: Normal range of motion.  Tenderness along lower lumbar and sacral spine and left lumbar musculature   Neurological: She is alert.  Reflex  Scores:      Patellar reflexes are 2+ on the right side and 2+ on the left side. Antalgic gait. 5/5 strength in upper and lower extremities bilaterally.   Skin: Skin is warm and dry. She is not diaphoretic.  Nursing note and vitals reviewed.   ED Course  Procedures   DIAGNOSTIC STUDIES: Oxygen Saturation is 94% on RA, adquate by my interpretation.    COORDINATION OF CARE: 7:04 PM Discussed treatment plan with pt at bedside and pt agreed to plan.   Labs Review Labs Reviewed - No data to display  Imaging Review Dg Lumbar Spine Complete  04/11/2014   CLINICAL DATA:  Left greater than right acute low back and tail pain, status post fall  EXAM: LUMBAR SPINE - COMPLETE 4+ VIEW  COMPARISON:  04/11/2014  FINDINGS: Normal lumbar spine alignment without fracture, compression deformity, or focal kyphosis. Advanced degenerative disc disease and spondylosis at L5 S1 with disc space narrowing, sclerosis and bony spurring. No pars defects. Normal appearing pedicles. Symmetric SI joints. Normal bowel gas pattern. Aortic atherosclerosis present.  IMPRESSION: Degenerative changes, as above.  No acute osseous finding.   Electronically Signed   By: Daryll Brod M.D.   On: 04/11/2014 19:48   Dg Sacrum/coccyx  04/11/2014   CLINICAL DATA:  Low back pain.  Fall 2 days ago  EXAM: SACRUM AND COCCYX - 2+ VIEW  COMPARISON:  None.  FINDINGS: No acute fracture. No dislocation. Moderate degenerative change of the hip joints. Severe narrowing of the L5-S1 disc.  IMPRESSION: No acute bony pathology.  Degenerative change.   Electronically Signed   By: Maryclare Bean M.D.   On: 04/11/2014 19:47     EKG Interpretation None      MDM   Final diagnoses:  Low back pain  Fall down stairs, initial encounter   Pt presenting to ED with c/o lower back pain, tenderness along lower lumbar spine and musculature. No focal neuro deficit.  No red flag symptoms. Not concerned for cauda equina or spinal abscess. Low concern for  fracture, however, due to mechanism of injury, plain films ordered. Plain films: negative for acute injury. Will tx for pain. Rx: diclofenac, tramadol, and flexeril. Home care instructions provided. Advised to f/u with PCP next week for recheck of symptoms as needed. Return precautions provided. Pt verbalized understanding and agreement with tx plan.    I personally performed the services described in this documentation, which was scribed in my presence. The recorded information has been reviewed and is accurate.     Noland Fordyce, PA-C 04/11/14 2009  Dorie Rank, MD 04/11/14 [phone]

## 2014-04-11 NOTE — ED Notes (Signed)
Patient transported to X-ray 

## 2014-04-11 NOTE — ED Notes (Signed)
Pt states she has a ride home. 

## 2014-04-11 NOTE — ED Notes (Signed)
Bed: [facility] Expected date:  Expected time:  Means of arrival:  Comments: 

## 2014-04-11 NOTE — ED Notes (Signed)
Pt c/o L lower back pain after a slip and fall x 2 days.  Pain score 9/10.  Sts she slipped and fell on wet steps.  Pt reports taking tylenol w/o relief.  Denies bruising and numbness/tingling.

## 2014-05-13 ENCOUNTER — Other Ambulatory Visit: Payer: Self-pay | Admitting: Gastroenterology

## 2014-05-13 DIAGNOSIS — K449 Diaphragmatic hernia without obstruction or gangrene: Secondary | ICD-10-CM | POA: Diagnosis not present

## 2014-05-13 DIAGNOSIS — Z86010 Personal history of colonic polyps: Secondary | ICD-10-CM | POA: Diagnosis not present

## 2014-05-13 DIAGNOSIS — K2270 Barrett's esophagus without dysplasia: Secondary | ICD-10-CM | POA: Diagnosis not present

## 2014-05-13 DIAGNOSIS — Z09 Encounter for follow-up examination after completed treatment for conditions other than malignant neoplasm: Secondary | ICD-10-CM | POA: Diagnosis not present

## 2014-05-14 ENCOUNTER — Telehealth: Payer: Self-pay | Admitting: Diagnostic Neuroimaging

## 2014-05-14 ENCOUNTER — Telehealth: Payer: Self-pay | Admitting: Neurology

## 2014-05-14 NOTE — Telephone Encounter (Signed)
I called back.  Relayed Dr [employer] message.  She verbalized understanding.

## 2014-05-14 NOTE — Telephone Encounter (Signed)
Pt is calling stating she has had a headache for 3 days. She states that Dr. Leta Baptist wanted her to have a MRI, but she has not had that done.  She wants to know if Dr. Leta Baptist will call in some predniSONE (DELTASONE) 5 MG tablet or Fiorcet.  She uses [facility] on [address].  Please call and advise.

## 2014-05-14 NOTE — Telephone Encounter (Signed)
I received a message from the answering service that she called regarding a headache for the past 3 days. I called the patient at [phone]. She states that she has just spoken to someone from the office and understands that medication cannot be called in as she had not followed all medical advice. I advised her that if she feels the headache is unusually bad or if she has any neurologic symptoms that she should go to the emergency room to be evaluated. Otherwise, she should follow up the office

## 2014-05-14 NOTE — Telephone Encounter (Signed)
See last note re: plan. Patient has not followed my medical advice. No meds to be called in. -VRP

## 2014-05-21 DIAGNOSIS — F319 Bipolar disorder, unspecified: Secondary | ICD-10-CM | POA: Diagnosis not present

## 2014-06-12 DIAGNOSIS — N3010 Interstitial cystitis (chronic) without hematuria: Secondary | ICD-10-CM | POA: Diagnosis not present

## 2014-06-12 DIAGNOSIS — R825 Elevated urine levels of drugs, medicaments and biological substances: Secondary | ICD-10-CM | POA: Insufficient documentation

## 2014-06-24 DIAGNOSIS — F319 Bipolar disorder, unspecified: Secondary | ICD-10-CM | POA: Diagnosis not present

## 2014-07-09 ENCOUNTER — Telehealth: Payer: Self-pay | Admitting: Diagnostic Neuroimaging

## 2014-07-09 NOTE — Telephone Encounter (Signed)
Pt is calling stating that her mental health doctor took her off her Lamictal and put her on Oxcarbazepin 300mg .  She has not taken it.  She wants to know if this is ok to take.  Please call and advise.

## 2014-07-14 NOTE — Telephone Encounter (Signed)
Called and left message asking the pt to call back

## 2014-08-18 ENCOUNTER — Telehealth: Payer: Self-pay | Admitting: Diagnostic Neuroimaging

## 2014-08-18 ENCOUNTER — Other Ambulatory Visit: Payer: Self-pay | Admitting: *Deleted

## 2014-08-18 DIAGNOSIS — R569 Unspecified convulsions: Secondary | ICD-10-CM

## 2014-08-18 NOTE — Telephone Encounter (Signed)
Patient called and stated that Dr. Leta Baptist had ordered an MRI over a year ago and she never got it. She has decided she would like to get one now but will need another order. Please call and advise.

## 2014-08-18 NOTE — Telephone Encounter (Signed)
Patient called and stated that Dr. Leta Baptist had ordered a MRI over a year ago and she never got it. She has decided that she would like to get one now but will need another order. Please

## 2014-08-18 NOTE — Telephone Encounter (Signed)
Spoke with Dr. Leta Baptist who was ok with reordering the MRI. Called and spoke with the pt and informed her that I was placing the order for MRI but she needed to make an appt after it was complete for a follow up. She stated and understanding and a thanks

## 2014-08-22 DIAGNOSIS — Z79899 Other long term (current) drug therapy: Secondary | ICD-10-CM | POA: Diagnosis not present

## 2014-08-23 ENCOUNTER — Emergency Department (HOSPITAL_COMMUNITY)
Admission: EM | Admit: 2014-08-23 | Discharge: 2014-08-23 | Disposition: A | Discharge status: Home / Self Care | Payer: Medicare Other | Attending: Emergency Medicine | Admitting: Emergency Medicine

## 2014-08-23 ENCOUNTER — Encounter (HOSPITAL_COMMUNITY): Payer: Self-pay | Admitting: Emergency Medicine

## 2014-08-23 ENCOUNTER — Emergency Department (HOSPITAL_COMMUNITY): Payer: Medicare Other

## 2014-08-23 DIAGNOSIS — Z720 Tobacco use: Secondary | ICD-10-CM | POA: Diagnosis not present

## 2014-08-23 DIAGNOSIS — Z7952 Long term (current) use of systemic steroids: Secondary | ICD-10-CM | POA: Insufficient documentation

## 2014-08-23 DIAGNOSIS — F419 Anxiety disorder, unspecified: Secondary | ICD-10-CM | POA: Insufficient documentation

## 2014-08-23 DIAGNOSIS — Z87448 Personal history of other diseases of urinary system: Secondary | ICD-10-CM | POA: Diagnosis not present

## 2014-08-23 DIAGNOSIS — Z7982 Long term (current) use of aspirin: Secondary | ICD-10-CM | POA: Diagnosis not present

## 2014-08-23 DIAGNOSIS — Z791 Long term (current) use of non-steroidal anti-inflammatories (NSAID): Secondary | ICD-10-CM | POA: Diagnosis not present

## 2014-08-23 DIAGNOSIS — Z8719 Personal history of other diseases of the digestive system: Secondary | ICD-10-CM | POA: Insufficient documentation

## 2014-08-23 DIAGNOSIS — R634 Abnormal weight loss: Secondary | ICD-10-CM | POA: Diagnosis present

## 2014-08-23 DIAGNOSIS — E780 Pure hypercholesterolemia: Secondary | ICD-10-CM | POA: Diagnosis not present

## 2014-08-23 DIAGNOSIS — R630 Anorexia: Secondary | ICD-10-CM | POA: Insufficient documentation

## 2014-08-23 DIAGNOSIS — Z8614 Personal history of Methicillin resistant Staphylococcus aureus infection: Secondary | ICD-10-CM | POA: Diagnosis not present

## 2014-08-23 DIAGNOSIS — J984 Other disorders of lung: Secondary | ICD-10-CM | POA: Diagnosis not present

## 2014-08-23 DIAGNOSIS — G40909 Epilepsy, unspecified, not intractable, without status epilepticus: Secondary | ICD-10-CM | POA: Insufficient documentation

## 2014-08-23 DIAGNOSIS — F329 Major depressive disorder, single episode, unspecified: Secondary | ICD-10-CM | POA: Insufficient documentation

## 2014-08-23 DIAGNOSIS — F17210 Nicotine dependence, cigarettes, uncomplicated: Secondary | ICD-10-CM | POA: Diagnosis not present

## 2014-08-23 LAB — COMPREHENSIVE METABOLIC PANEL WITH GFR
ALT: 10 U/L — ABNORMAL LOW (ref 14–54)
AST: 12 U/L — ABNORMAL LOW (ref 15–41)
Albumin: 4.5 g/dL (ref 3.5–5.0)
Alkaline Phosphatase: 77 U/L (ref 38–126)
Anion gap: 6 (ref 5–15)
BUN: 16 mg/dL (ref 6–20)
CO2: 23 mmol/L (ref 22–32)
Calcium: 9.1 mg/dL (ref 8.9–10.3)
Chloride: 108 mmol/L (ref 101–111)
Creatinine, Ser: 0.81 mg/dL (ref 0.44–1.00)
GFR calc Af Amer: >60 mL/min
GFR calc non Af Amer: >60 mL/min
Glucose, Bld: 96 mg/dL (ref 70–99)
Potassium: 4.5 mmol/L (ref 3.5–5.1)
Sodium: 137 mmol/L (ref 135–145)
Total Bilirubin: 0.6 mg/dL (ref 0.3–1.2)
Total Protein: 7.5 g/dL (ref 6.5–8.1)

## 2014-08-23 LAB — CBC WITH DIFFERENTIAL/PLATELET
Basophils Absolute: 0 10*3/uL (ref 0.0–0.1)
Basophils Relative: 0 % (ref 0–1)
Eosinophils Absolute: 0.2 10*3/uL (ref 0.0–0.7)
Eosinophils Relative: 3 % (ref 0–5)
HCT: 45.7 % (ref 36.0–46.0)
Hemoglobin: 15.9 g/dL — ABNORMAL HIGH (ref 12.0–15.0)
Lymphocytes Relative: 32 % (ref 12–46)
Lymphs Abs: 2.1 10*3/uL (ref 0.7–4.0)
MCH: 33.4 pg (ref 26.0–34.0)
MCHC: 34.8 g/dL (ref 30.0–36.0)
MCV: 96 fL (ref 78.0–100.0)
Monocytes Absolute: 0.4 10*3/uL (ref 0.1–1.0)
Monocytes Relative: 6 % (ref 3–12)
Neutro Abs: 3.9 10*3/uL (ref 1.7–7.7)
Neutrophils Relative %: 59 % (ref 43–77)
Platelets: 270 10*3/uL (ref 150–400)
RBC: 4.76 MIL/uL (ref 3.87–5.11)
RDW: 14 % (ref 11.5–15.5)
WBC: 6.7 10*3/uL (ref 4.0–10.5)

## 2014-08-23 NOTE — Discharge Instructions (Signed)
Malnutrition Many of us think of malnutrition as a condition in which there is not enough to eat. Malnutrition is actually any condition where nutrition is poor. This means:  Too much to eat as we see in conditions of obesity.  Too little to eat with starvation. The following information is only for the malnourished with dietary deficiencies (poor diet). CAUSES  Under-nutrition can result from:  Poor intake.  Malabsorption  Lactation  Bleeding  Diarrhea  Old age.  Kidney failure.  Infancy  Poverty  Infection  Adolescence  Excessive sweating  Drug addiction  Pregnancy  Early childhood. Under-nutrition comes anytime the demand is more than the intake. SYMPTOMS  The problems depend on what type of malnutrition is present. Some general symptoms include:  Fatigue.  Dizziness.  Fainting  Weight loss.  Poor immune response.  Lack of menstruation.  Lack of growth in children.  Hair loss. DIAGNOSIS  Your caregiver will usually suspect malnutrition based on results of your:   Medical and dietary history.  Physical exam. This will often include measurements of your BMI (body mass index).  Perhaps some blood tests. These may include: plasma levels of nutrients and nutrient-dependent substances, such as:  Hemoglobin  Thyroid hormones  Transferrin  Albumin RISK FACTORS Persons in the following circumstances may be at risk of malnutrition.  Infants and children are at risk of under-nutrition. This is because of their high demand for energy and essential nutrients. Protein-energy malnutrition in children consuming inadequate amounts of protein, calories, and other nutrients is a particularly severe form of under-nutrition that delays growth and development. This includes [place]us and [place].  Hemorrhagic disease of the newborn is a life-threatening disorder. This is due to a lack of vitamin K, iron, folic acid, vitamin C, copper, zinc, and vitamin  A. This may occur in inadequately fed infants and children.  In adolescence, nutritional requirements increase because they are growing. Anorexia nervosa, a form of starvation, may affect adolescents.  Pregnancy and lactation. Requirements for all nutrients are increased during pregnancy and lactation.  Abnormal diets, such as pica (the consumption of nonnutritive substances, such as clay and charcoal), are common in pregnancy.  Anemia due to folic acid deficiency is common in pregnant women. This is especially true for those who have taken oral contraceptives. Folic acid supplements are now recommended for pregnant women. Folic acid prevents neural tube defects (spina bifida) in children.  Breast-fed-only infants may develop vitamin B12 deficiency if the mother is a vegan.  An alcoholic mother may have a handicapped and stunted child with fetal alcohol syndrome. This is due to the effects of alcohol on the fetus. Do not drink during pregnancy.  Old age: A weakened sense of taste and smell, loneliness, physical and mental handicaps, immobility, and chronic illness can hurt the food intake in the elderly. Absorption is reduced. This may add to iron deficiency, calcium and bone problems and also a softening of the bones due to lack of vitamin D. This is also made worse by not being in the sun.  With aging, we loose lean body mass. These changes and a reduction in physical activity result in lower energy and protein requirements compared with those of younger adults.  Chronic disease including malabsorption states (including those resulting from surgery) tend to impair the absorption of fat-soluble vitamins, vitamin B12, calcium, and iron.  Liver disease impairs the storage of vitamins A and B12. It also interferes with the metabolism of protein and energy sources.  Kidney disease may   cause deficiencies of protein, iron, and vitamin D.  Cancer and AIDS may cause anorexia. This is a loss of  appetite.  Vegetarian diet. The most common form of this type of diet is when meat and fish are not eaten, but eggs and dairy products are eaten. Iron deficiency is the only risk. Ovo-lacto vegetarians tend to live longer and to develop fewer chronic disabling conditions than their meat-eating peers. However, their lifestyle usually includes regular exercise and abstention from alcohol and tobacco. This may contribute to better health. Vegans consume no animal products and are susceptible to vitamin B12 deficiency. Yeast extracts and oriental-style fermented foods provide this vitamin. Intake of calcium, iron, and zinc also tends to be low. A fruitarian diet (eat only fruit) is deficient in protein, salt, and many micronutrients. This is not recommended.  Fad diets: Many commercial diets are claimed to enhance well-being or reduce weight. A physician should be alert to early evidence of nutrient deficiency or toxicity in patients on these diets. Such diets have resulted in vitamin, mineral, and protein deficiency states and cardiac, renal, and metabolic disorders. Some fad diets have resulted in death. People on very low calorie diets (less than 400 kcal/day) cannot sustain health for long. Some trace mineral supplements have induced toxicity.  Alcohol or drug dependency: Addiction leads to a troubled lifestyle in which adequate nourishment is ignored. Absorption and metabolism of nutrients are impaired. High levels of alcohol are poisonous. Too much alcohol can cause tissue injury, particularly of the GI tract, liver, pancreas, brain, and peripheral nervous system. Beer drinkers who consume food may gain weight, but alcoholics who use more than one quart of hard liquor per day lose weight and become undernourished. Drug addicts are usually very skinny. Alcoholism is the most common cause of thiamine deficiency and may lead to deficiencies of magnesium, zinc, and other vitamins. TREATMENT  Get treatment if  you experience changes in how your body is working.  PREVENTION  Eating a good, well-balanced diet helps to prevent most forms of malnutrition. Document Released: 02/18/2005 Document Revised: 06/27/2011 Document Reviewed: 03/12/2007 [facility] Patient Information 2015 [place], [place]. This information is not intended to replace advice given to you by your health care provider. Make sure you discuss any questions you have with your health care provider.  Emergency Department Resource Guide 1) Find a Doctor and Pay Out of Pocket Although you won't have to find out who is covered by your insurance plan, it is a good idea to ask around and get recommendations. You will then need to call the office and see if the doctor you have chosen will accept you as a new patient and what types of options they offer for patients who are self-pay. Some doctors offer discounts or will set up payment plans for their patients who do not have insurance, but you will need to ask so you aren't surprised when you get to your appointment.  2) Contact Your Local Health Department Not all health departments have doctors that can see patients for sick visits, but many do, so it is worth a call to see if yours does. If you don't know where your local health department is, you can check in your phone book. The CDC also has a tool to help you locate your state's health department, and many state websites also have listings of all of their local health departments.  3) Find a [facility] If your illness is not likely to be very severe or complicated, you  may want to try a walk in clinic. These are popping up all over the country in pharmacies, drugstores, and shopping centers. They're usually staffed by nurse practitioners or physician assistants that have been trained to treat common illnesses and complaints. They're usually fairly quick and inexpensive. However, if you have serious medical issues or chronic medical problems, these  are probably not your best option.  No Primary Care Doctor: - Call Health Connect at  [phone] - they can help you locate a primary care doctor that  accepts your insurance, provides certain services, etc. - Physician Referral Service- [phone]  Chronic Pain Problems: Organization         Address  Phone   Notes  [facility]  [phone] Patients need to be referred by their primary care doctor.   Medication Assistance: Organization         Address  Phone   Notes  [facility] Medication [facility] [address] [place], [place] [address] [phone] --Must be a resident of [facility] -- Must have NO insurance coverage whatsoever (no Medicaid/ Medicare, etc.) -- The pt. MUST have a primary care doctor that directs their care regularly and follows them in the community   Med[facility]ist  [phone]   [employer]  [phone]    Agencies that provide inexpensive medical care: Organization         Address  Phone   Notes  [place]  [phone]   [facility]    [phone]   [facility] [place], [place] [address] [phone]   [place] [address] [place] [phone]   Planned Parenthood    [phone]   [facility]    [phone]   [place] [address], [place] Phone:  [phone], Fax:  [phone] Hours of Operation:  9 am - 6 pm, M-F.  Also accepts Medicaid/Medicare and self-pay.  [facility] [place], [address], [place] Phone: [phone], Fax: [phone]. Hours of Operation:  8:30 am - 5:30 pm, M-F.  Also accepts Medicaid and self-pay.  [facility] [address], [place] Phone: [phone]   [place], [place], [place] [phone], Ext. [phone] Mondays &  Thursdays: 7-9 AM.  First 15 patients are seen on a first come, first serve basis.    [place] Providers:  Organization         Address  Phone   Notes  [facility] [address], [place] [phone] Also accepts self-pay patients.  [facility] [address], [place]  [phone]   [place], [address], [place] [phone]   [facility] [address] [place] [phone]   Lucianne Lei [address], [place]   [phone] Only accepts [place] Access [place] patients after they have their name applied to their card.   Self-Pay (no insurance) in [facility]:  Organization         Address  Phone   Notes  Sickle Cell Patients, [facility] [place] [phone]   [facility] [place] (  [facility] [place]  [place], [address], [place] [phone]   [facility]/Dr. Osei-Bonsu  [address] [place] or [address], [place] [phone] Phone number for both [place] and [place] locations is the same.  [facility] [address], [place] [phone]   [facility] [address] [place] or [address] [phone]   [facility] [address] [place] [phone], phone; [phone], fax Sees patients 1st and 3rd Saturday of every month.  Must not qualify for public or private insurance (i.e. Medicaid, Medicare, [place] Health Choice, Veterans' Benefits)  Household income should be no more than 200% of the poverty level The clinic cannot treat you if you are pregnant or think you are pregnant  Sexually transmitted diseases are not treated at the  clinic.    Dental Care: Organization         Address  Phone  Notes  [facility] Department of [facility] [place] [phone] Accepts children up to age 3 who are enrolled in [place] or [place]; pregnant women with a Medicaid card; and children who have applied for Medicaid or [place] Health Choice, but were declined, whose parents can pay a reduced fee at time of service.  [facility] Department of [facility]  [address], [place] [phone] Accepts children up to age 77 who are enrolled in [place] or [place]; pregnant women with a Medicaid card; and children who have applied for Medicaid or  Health Choice, but were declined, whose parents can pay a reduced fee at time of service.  [facility] Adult [facility] Access PROGRAM  [place] [phone] Patients are seen by appointment only. Walk-ins are not accepted. [place] will see patients 49 years of age and older. Monday - Tuesday (8am-5pm) Most Wednesdays (8:30-5pm) $30 per visit, cash only  [facility] Adult Dental Access PROGRAM  [address], [facility] [phone] Patients are seen by appointment only. Walk-ins are not accepted. [place] will see patients 80 years of age and older. One Wednesday Evening (Monthly: Volunteer Based).  $30 per visit, cash only  [place]  [phone] for adults; Children under age 61, call Graduate Pediatric Dentistry at [phone]. Children aged 92-14, please call [phone] to request a pediatric application.  Dental services are provided in all areas of dental care including fillings, crowns and bridges, complete and partial dentures, implants, gum treatment, root canals, and extractions. Preventive care is also provided. Treatment is provided to both adults and children. Patients are selected via a lottery and there is often a  waiting list.   [facility] [address], [name]  [phone] [email or web address]   [facility] [address] [place], [place] [phone], Ext. [phone] Second and Fourth Thursday of each month, opens at 6:30 AM; Clinic ends at 9 AM.  Patients are seen on a first-come first-served basis, and a limited number are seen during each clinic.   [facility]  [address] Hillard Danker [place], [place] [phone]   Eligibility Requirements You must have lived in [place], [place], or [place] counties for at least the last three months.   You cannot be eligible for  state or Museum/gallery conservator, including [employer], [place], or [employer].   You generally cannot be eligible for healthcare insurance through your employer.    How to apply: Eligibility screenings are held every Tuesday and Wednesday afternoon from 1:00 pm until 4:00 pm. You do not need an appointment for the interview!  [facility] [address] [place], [place]   [place]  [place] Department  [place]  [phone]    Behavioral Health Resources in the Community: Intensive Outpatient Programs Organization         Address  Phone  Notes  [employer] [address], [place], [place] [phone]   [facility] Outpatient [address] [place], [place]   [facility]: [facility] [address] [place], [place]   [place] [address]  [place], [place] or [phone]   Substance Abuse Resources Organization         Address  Phone  Notes  Alcohol and Drug Services  [phone]   [place]  [phone]   [facility] [place]   Chinita Pester  [phone]   Residential & Outpatient Substance Abuse  Program  [phone]   Psychological Services Organization         Address  Phone  Notes  [facility] [place]  [place]  [phone]   [place] [address], [place] or [phone]    Mobile Crisis Teams Organization         Address  Phone  Notes  [facility], Mobile Crisis Care Unit  [phone]   [facility]  [address] [place], [place]   [place] Levels [address], [place] [phone]    Self-Help/Support Groups Organization         Address  Phone             Notes  [place]. of [place] - variety of support groups  [place] Call for more information  Narcotics Anonymous (NA), [facility] [address], [employer] [place]  2 meetings at this location   Special educational needs teacher         Address  Phone  Notes  [facility] [place],    [place]  [phone]   [facility]  [address], [place] [id number], [place], [place]   [place], [place] [phone] Admissions: 8am-3pm M-F  Incentives Substance [place] [address]    [place], [place] [phone]   [facility] [address] Jadene Pierini [place], [place]   [facility] [address]  [place], [place]   [facility] - Intensive Outpatient [address], [place], [place]   [facility] ([place].,  [place], [place] [phone] or [phone]   [facility] (RTS) [address] [place], [place] Accepts Medicaid  [facility] [place] [address]  [place] [phone] Substance Abuse/Addiction Treatment   [facility] Organization          Address  Phone  Notes  [facility]  [phone]   Domenic Schwab, PhD [place], [address] [place], [place]   [phone]  [phone] or [phone]) [phone]   [facility]   [address] [place], [place] [phone]   [employer] [address], [place], [place] [phone] Insurance/Medicaid/sponsorship through [facility] and Families [address]                                    [place], [place] [phone] [place] [address]   [place], [place] [phone]    Dr. Adele Schilder  [phone]   [facility] Dept. 1) [address], [place] 2) [place] 3)  [place] [address], [place] [phone]  [phone]   [place] [phone] or [phone] (After Hours)

## 2014-08-23 NOTE — ED Notes (Signed)
Pt states that she has lost 15 lbs over the course of 9 months.  Also states that she has an old bruise on her thigh that she does not remember getting.  States that she hasn't mentioned any of this to her PCP when asked by this Probation officer.  States she had labs [facility] at [facility] yesterday but didn't want to wait for the results. [facility] which she states she does not believe she has.  States that her family member "who works at [employer]" told her she needed to come to the ER.

## 2014-08-23 NOTE — ED Provider Notes (Signed)
CSN: [id number]     Arrival date & time 08/23/14  1644 History   First MD Initiated Contact with Patient 08/23/14 1701     Chief Complaint  Patient presents with  . Weight Loss  . Bleeding/Bruising    The history is provided by the patient. No language interpreter was used.   [name] presents for evaluation of weight loss.  [name] has had a 15 pound weight loss in the last nine months (10 pounds in the last two months).  [name] reports decreased appetite.  [name] denies fevers, chest pain, abdominal pain, vomiting, diarrhea, SI, depressive sxs, hematochezia, or melena.  [name] is a regular crack cocaine user, last used yesterday. [name] has a chronic smokers cough, unchanged from baseline.  [name] also has a bruise on her left thigh, not sure when it got there.  Sxs are moderate, constant, worsening.    Past Medical History  Diagnosis Date  . Seizures   . Gastric ulcer   . MRSA (methicillin resistant staph aureus) culture positive   . Interstitial cystitis   . Depression   . Mood disorder   . Anxiety   . Acid reflux   . High cholesterol    Past Surgical History  Procedure Laterality Date  . Shoulder arthroscopy    . Rotator cuff repair      R side  . Cataract extraction    . Multiple tooth extractions     Family History  Problem Relation Age of Onset  . Adopted: Yes  . Family history unknown: Yes   History  Substance Use Topics  . Smoking status: Current Every Day Smoker -- 1.00 packs/day for 40 years    Types: Cigarettes  . Smokeless tobacco: Never Used  . Alcohol Use: No   OB History    No data available     Review of Systems  All other systems reviewed and are negative.     Allergies  Haloperidol decanoate and Zithromax  Home Medications   Prior to Admission medications   Medication Sig Start Date End Date Taking? Authorizing Provider  aspirin 325 MG tablet Take 650 mg by mouth daily as needed for headache.     Historical Provider, MD  cyclobenzaprine (FLEXERIL) 10 MG  tablet Take 1 tablet (10 mg total) by mouth 2 (two) times daily as needed for muscle spasms. 04/11/14   Noland Fordyce, PA-C  diclofenac (VOLTAREN) 50 MG EC tablet Take 1 tablet (50 mg total) by mouth 2 (two) times daily. 04/11/14   Noland Fordyce, PA-C  lamoTRIgine (LAMICTAL) 100 MG tablet 100 mg every morning and 200 mg every night 11/18/13   Penni Bombard, MD  predniSONE (DELTASONE) 5 MG tablet Begin taking 6 tablets daily, taper by one tablet daily until off the medication. 11/15/13   Kathrynn Ducking, MD  risperiDONE (RISPERDAL) 2 MG tablet Take 1 tablet by mouth 2 (two) times daily. 10/15/13   Historical Provider, MD  simvastatin (ZOCOR) 20 MG tablet Take 20 mg by mouth every evening.    Historical Provider, MD  traMADol (ULTRAM) 50 MG tablet Take 1 tablet (50 mg total) by mouth 4 (four) times daily. 04/11/14   Noland Fordyce, PA-C  zolpidem (AMBIEN) 5 MG tablet Take 1 tablet by mouth daily. 11/03/13   Historical Provider, MD   BP 113/74 mmHg  Pulse 75  Temp(Src) 98.2 F (36.8 C) (Oral)  Resp 18  SpO2 98% Physical Exam  Constitutional: [name] is oriented to person, place, and time. [name]  appears well-developed and well-nourished.  HENT:  Head: Normocephalic and atraumatic.  Cardiovascular: Normal rate and regular rhythm.   No murmur heard. Pulmonary/Chest: Effort normal and breath sounds normal. No respiratory distress.  Abdominal: Soft. There is no tenderness. There is no rebound and no guarding.  Musculoskeletal: [name] exhibits no edema or tenderness.  Healing bruise on left anterior thigh.   Neurological: [name] is alert and oriented to person, place, and time.  Skin: Skin is warm and dry.  Psychiatric:  Denies SI, hallucinations.    Nursing note and vitals reviewed.   ED Course  Procedures (including critical care time) Labs Review Labs Reviewed  COMPREHENSIVE METABOLIC PANEL - Abnormal; Notable for the following:    AST 12 (*)    ALT 10 (*)    All other components within normal  limits  CBC WITH DIFFERENTIAL/PLATELET - Abnormal; Notable for the following:    Hemoglobin 15.9 (*)    All other components within normal limits    Imaging Review Dg Chest 2 View  08/23/2014   CLINICAL DATA:  10 lb weight loss in the past 2 months. Frequent, easy bruising and bleeding. Smoker.  EXAM: CHEST  2 VIEW  COMPARISON:  05/01/2012  FINDINGS: The cardiomediastinal silhouette is unchanged and within normal limits. The lungs remain hyperinflated. Interstitial opacities on the prior study have resolved. Chronic scarring is again noted in the right lung base. There is no evidence of acute airspace consolidation, edema, pleural effusion, or pneumothorax. Slight left convex thoracic scoliosis is again seen.  IMPRESSION: No active cardiopulmonary disease.   Electronically Signed   By: Logan Bores   On: 08/23/2014 17:59     EKG Interpretation None      MDM   Final diagnoses:  Unintentional weight loss    Patient here for evaluation of weight loss. Chest x-ray obtained to evaluate for malignancy given patient's history of smoking. Screening CBC and CMP obtained which had no major abnormalities. Discussed with patient the importance of PCP follow-up for further evaluation of weight loss. Discussed with patient concern for substance abuse contributing to her weight loss and provided her options for outpatient treatment.    Quintella Reichert, MD 08/23/14 1921

## 2014-09-09 DIAGNOSIS — F319 Bipolar disorder, unspecified: Secondary | ICD-10-CM | POA: Diagnosis not present

## 2014-09-13 ENCOUNTER — Inpatient Hospital Stay: Admission: RE | Admit: 2014-09-13 | Payer: Medicare Other | Source: Ambulatory Visit

## 2014-09-27 ENCOUNTER — Inpatient Hospital Stay: Admission: RE | Admit: 2014-09-27 | Payer: Medicare Other | Source: Ambulatory Visit

## 2014-10-15 ENCOUNTER — Ambulatory Visit
Admission: RE | Admit: 2014-10-15 | Discharge: 2014-10-15 | Disposition: A | Discharge status: Home / Self Care | Payer: Medicare Other | Source: Ambulatory Visit | Attending: Diagnostic Neuroimaging | Admitting: Diagnostic Neuroimaging

## 2014-10-15 ENCOUNTER — Encounter (INDEPENDENT_AMBULATORY_CARE_PROVIDER_SITE_OTHER): Payer: Medicare Other | Admitting: Diagnostic Neuroimaging

## 2014-10-15 DIAGNOSIS — R569 Unspecified convulsions: Secondary | ICD-10-CM

## 2014-10-15 MED ORDER — GADOBENATE DIMEGLUMINE 529 MG/ML IV SOLN
10.0000 mL | Freq: Once | INTRAVENOUS | Status: AC | PRN
  Administered 2014-10-15: 10 mL via INTRAVENOUS

## 2014-10-16 ENCOUNTER — Telehealth: Payer: Self-pay | Admitting: *Deleted

## 2014-10-16 NOTE — Telephone Encounter (Signed)
Spoke to the pt on the phone about her MRI. Informed her that there were no changes since the the CT scan she had in 2014. She thanked me for calling and asked that I let Dr. Leta Baptist know that she has had only one headache in the last year. i asked her to call back with further questions

## 2014-10-22 DIAGNOSIS — R079 Chest pain, unspecified: Secondary | ICD-10-CM | POA: Diagnosis not present

## 2014-11-12 ENCOUNTER — Other Ambulatory Visit: Payer: Self-pay | Admitting: Family Medicine

## 2014-11-12 DIAGNOSIS — R1011 Right upper quadrant pain: Secondary | ICD-10-CM | POA: Diagnosis not present

## 2014-11-12 DIAGNOSIS — R1010 Upper abdominal pain, unspecified: Secondary | ICD-10-CM

## 2014-11-14 ENCOUNTER — Ambulatory Visit
Admission: RE | Admit: 2014-11-14 | Discharge: 2014-11-14 | Disposition: A | Discharge status: Home / Self Care | Payer: Medicare Other | Source: Ambulatory Visit | Attending: Family Medicine | Admitting: Family Medicine

## 2014-11-14 DIAGNOSIS — N281 Cyst of kidney, acquired: Secondary | ICD-10-CM | POA: Diagnosis not present

## 2014-11-14 DIAGNOSIS — R1010 Upper abdominal pain, unspecified: Secondary | ICD-10-CM

## 2014-11-27 DIAGNOSIS — F319 Bipolar disorder, unspecified: Secondary | ICD-10-CM | POA: Diagnosis not present

## 2015-01-05 ENCOUNTER — Other Ambulatory Visit: Payer: Self-pay | Admitting: Diagnostic Neuroimaging

## 2015-01-08 DIAGNOSIS — F319 Bipolar disorder, unspecified: Secondary | ICD-10-CM | POA: Diagnosis not present

## 2015-01-13 ENCOUNTER — Telehealth: Payer: Self-pay | Admitting: Diagnostic Neuroimaging

## 2015-01-13 NOTE — Telephone Encounter (Signed)
I called and LMVM mobile for pt to return call about making appt (annual) for refill on lamictal.

## 2015-01-13 NOTE — Telephone Encounter (Signed)
Returned call from patient who states she needs enough medication to last until her FU on 01/19/15. Attempted to schedule for earlier FU, but unable due to her transportation issues. She states she has spoken with pharmacist at [facility] today, and they are expecting call from this office. She states she will call pharmacy later to arrange delivery to her home.  Spoke with Hetal, pharmacist , [facility] who states they will provide patient with 10 day supply of Lamictal.

## 2015-01-13 NOTE — Telephone Encounter (Signed)
Patient called in to make her annual appointment for her medication.  She states she has only 5 tablets left which will last 1 1/2 days.  Could you please call in Lamictal 100 mg for her to [facility] on [address] and have it delivered to her as she has no transportation.  Thanks!

## 2015-01-19 ENCOUNTER — Ambulatory Visit: Payer: Medicare Other | Admitting: Diagnostic Neuroimaging

## 2015-01-22 ENCOUNTER — Ambulatory Visit (INDEPENDENT_AMBULATORY_CARE_PROVIDER_SITE_OTHER): Payer: Medicare Other | Admitting: Diagnostic Neuroimaging

## 2015-01-22 ENCOUNTER — Encounter: Payer: Self-pay | Admitting: Diagnostic Neuroimaging

## 2015-01-22 VITALS — BP 96/66 | HR 80 | Resp 20 | Ht 66.0 in | Wt 119.0 lb

## 2015-01-22 DIAGNOSIS — R569 Unspecified convulsions: Secondary | ICD-10-CM

## 2015-01-22 MED ORDER — LAMOTRIGINE 100 MG PO TABS: ORAL_TABLET | ORAL | Status: DC

## 2015-01-22 NOTE — Patient Instructions (Signed)
Thank you for coming to see [place] at [facility]. I hope we have been able to provide you high quality care today.  You may receive a patient satisfaction survey over the next few weeks. We would appreciate your feedback and comments so that we may continue to improve ourselves and the health of our patients.  - continue lamotrigine - please stop smoking   ~~~~~~~~~~~~~~~~~~~~~~~~~~~~~~~~~~~~~~~~~~~~~~~~~~~~~~~~~~~~~~~~~  DR. Haeden Hudock'S GUIDE TO HAPPY AND HEALTHY LIVING These are some of my general health and wellness recommendations. Some of them may apply to you better than others. Please use common sense as you try these suggestions and feel free to ask me any questions.   ACTIVITY/FITNESS Mental, social, emotional and physical stimulation are very important for brain and body health. Try learning a new activity (arts, music, language, sports, games).  Keep moving your body to the best of your abilities. You can do this at home, inside or outside, the park, community center, gym or anywhere you like. Consider a physical therapist or personal trainer to get started. Consider the app Sworkit. Fitness trackers such as smart-watches, smart-phones or Fitbits can help as well.   NUTRITION Eat more plants: colorful vegetables, nuts, seeds and berries.  Eat less sugar, salt, preservatives and processed foods.  Avoid toxins such as cigarettes and alcohol.  Drink water when you are thirsty. Warm water with a slice of lemon is an excellent morning drink to start the day.  Consider these websites for more information The Nutrition Source (https://www.henry-hernandez.biz/) Precision Nutrition (WindowBlog.ch)   RELAXATION Consider practicing mindfulness meditation or other relaxation techniques such as deep breathing, prayer, yoga, tai chi, massage. See website mindful.org or the apps Headspace or Calm to help get  started.   SLEEP Try to get at least 7-8+ hours sleep per day. Regular exercise and reduced caffeine will help you sleep better. Practice good sleep hygeine techniques. See website sleep.org for more information.   PLANNING Prepare estate planning, living will, healthcare POA documents. Sometimes this is best planned with the help of an attorney. Theconversationproject.org and agingwithdignity.org are excellent resources.
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
## 2015-01-23 LAB — COMPREHENSIVE METABOLIC PANEL WITH GFR
ALT: 5 [IU]/L (ref 0–32)
AST: 10 [IU]/L (ref 0–40)
Albumin/Globulin Ratio: 1.7 (ref 1.1–2.5)
Albumin: 4 g/dL (ref 3.6–4.8)
Alkaline Phosphatase: 74 [IU]/L (ref 39–117)
BUN/Creatinine Ratio: 14 (ref 11–26)
BUN: 12 mg/dL (ref 8–27)
Bilirubin Total: <0.2 mg/dL (ref 0.0–1.2)
CO2: 23 mmol/L (ref 18–29)
Calcium: 9.1 mg/dL (ref 8.7–10.3)
Chloride: 104 mmol/L (ref 97–108)
Creatinine, Ser: 0.88 mg/dL (ref 0.57–1.00)
GFR calc Af Amer: 81 mL/min/{1.73_m2}
GFR calc non Af Amer: 71 mL/min/{1.73_m2}
Globulin, Total: 2.3 g/dL (ref 1.5–4.5)
Glucose: 64 mg/dL — ABNORMAL LOW (ref 65–99)
Potassium: 4.4 mmol/L (ref 3.5–5.2)
Sodium: 140 mmol/L (ref 134–144)
Total Protein: 6.3 g/dL (ref 6.0–8.5)

## 2015-01-23 LAB — CBC WITH DIFFERENTIAL/PLATELET
Basophils Absolute: 0 10*3/uL (ref 0.0–0.2)
Basos: 0 %
EOS (ABSOLUTE): 0.2 10*3/uL (ref 0.0–0.4)
Eos: 3 %
Hematocrit: 44.2 % (ref 34.0–46.6)
Hemoglobin: 14.9 g/dL (ref 11.1–15.9)
Immature Grans (Abs): 0 10*3/uL (ref 0.0–0.1)
Immature Granulocytes: 1 %
Lymphocytes Absolute: 1.9 10*3/uL (ref 0.7–3.1)
Lymphs: 33 %
MCH: 32.5 pg (ref 26.6–33.0)
MCHC: 33.7 g/dL (ref 31.5–35.7)
MCV: 97 fL (ref 79–97)
Monocytes Absolute: 0.5 10*3/uL (ref 0.1–0.9)
Monocytes: 9 %
Neutrophils Absolute: 3.2 10*3/uL (ref 1.4–7.0)
Neutrophils: 54 %
Platelets: 257 10*3/uL (ref 150–379)
RBC: 4.58 x10E6/uL (ref 3.77–5.28)
RDW: 13.3 % (ref 12.3–15.4)
WBC: 5.8 10*3/uL (ref 3.4–10.8)

## 2015-01-23 NOTE — Telephone Encounter (Signed)
She called saying that she had a large knot at the site of her phlebotomy draw from her blood yesterday.  I advised her to use warm compresses today. Hopefully it will be better by tomorrow.  If she is not doing much better she should call back Monday.  However,  If the site starts to look much worse I want her to go to urgent care or emergency room.

## 2015-01-26 ENCOUNTER — Telehealth: Payer: Self-pay | Admitting: *Deleted

## 2015-01-26 NOTE — Telephone Encounter (Signed)
Per Dr Leta Baptist, spoke with patient and informed her of normal lab results.  She verbalized understanding. She then stated she spoke with dr on call,  Dr Felecia Shelling on 01/23/15 due to issue with lab draw site on her right inner elbow of "hardness, bruises". She states she has been applying warm compresses as advised and area is smaller today. She states she has two very small red-purplish bruises and the area "is hard but softer than it was". She denies fever, redness, swelling, warmth, and pain in this area. She states she was instructed to go to Urgent Care if not improved. Discussed at length that from her description it sounds as if area is improving and resolving. She agrees. Advised that she should go to Urgent Care if she still feels she needs to, area is not improving, and/or other symptoms we discussed develop. Advised she may continue with warm compresses, try to elevate arm as much as possible. She verbalized understanding, stated she would observe her arm and call back with update in 1-2 days. Advised she call at any time as needed with any concerns. She verbalized understanding, appreciation. Of note, patient is not on blood thinners, per her report.

## 2015-01-26 NOTE — Telephone Encounter (Signed)
-----   Message from Penni Bombard, MD sent at 01/26/2015  1:21 PM EDT ----- Regarding: RE: lab results [place].   ----- Message -----    From: Minna Antis, RN    Sent: 01/26/2015   1:13 PM      To: Penni Bombard, MD Subject: lab results                                    Her labs from 10/6 are normal. I'll call her.  [place]

## 2015-01-29 ENCOUNTER — Telehealth: Payer: Self-pay | Admitting: *Deleted

## 2015-01-29 NOTE — Telephone Encounter (Signed)
Spoke with patient re: her arm. She states "There is still a knot there." She also stated "it is getting better",  "everything else is fine".  She states she does not feel she needs to go to Urgent Care at this point. Advised she call back if needed. She verbalized understanding, appreciation for call back.

## 2015-02-23 DIAGNOSIS — F319 Bipolar disorder, unspecified: Secondary | ICD-10-CM | POA: Diagnosis not present

## 2015-04-01 ENCOUNTER — Encounter (HOSPITAL_COMMUNITY): Payer: Self-pay | Admitting: Emergency Medicine

## 2015-04-01 ENCOUNTER — Emergency Department (HOSPITAL_COMMUNITY)
Admission: EM | Admit: 2015-04-01 | Discharge: 2015-04-01 | Disposition: A | Discharge status: Home / Self Care | Payer: Medicare Other | Attending: Emergency Medicine | Admitting: Emergency Medicine

## 2015-04-01 DIAGNOSIS — F17210 Nicotine dependence, cigarettes, uncomplicated: Secondary | ICD-10-CM | POA: Diagnosis not present

## 2015-04-01 DIAGNOSIS — Y998 Other external cause status: Secondary | ICD-10-CM | POA: Diagnosis not present

## 2015-04-01 DIAGNOSIS — F419 Anxiety disorder, unspecified: Secondary | ICD-10-CM | POA: Diagnosis not present

## 2015-04-01 DIAGNOSIS — Z8719 Personal history of other diseases of the digestive system: Secondary | ICD-10-CM | POA: Diagnosis not present

## 2015-04-01 DIAGNOSIS — R05 Cough: Secondary | ICD-10-CM | POA: Diagnosis present

## 2015-04-01 DIAGNOSIS — Y9384 Activity, sleeping: Secondary | ICD-10-CM | POA: Insufficient documentation

## 2015-04-01 DIAGNOSIS — Z8614 Personal history of Methicillin resistant Staphylococcus aureus infection: Secondary | ICD-10-CM | POA: Diagnosis not present

## 2015-04-01 DIAGNOSIS — Z79899 Other long term (current) drug therapy: Secondary | ICD-10-CM | POA: Insufficient documentation

## 2015-04-01 DIAGNOSIS — Y9289 Other specified places as the place of occurrence of the external cause: Secondary | ICD-10-CM | POA: Diagnosis not present

## 2015-04-01 DIAGNOSIS — J0100 Acute maxillary sinusitis, unspecified: Secondary | ICD-10-CM | POA: Insufficient documentation

## 2015-04-01 DIAGNOSIS — E7800 Pure hypercholesterolemia, unspecified: Secondary | ICD-10-CM | POA: Insufficient documentation

## 2015-04-01 DIAGNOSIS — X58XXXA Exposure to other specified factors, initial encounter: Secondary | ICD-10-CM | POA: Insufficient documentation

## 2015-04-01 DIAGNOSIS — S0502XA Injury of conjunctiva and corneal abrasion without foreign body, left eye, initial encounter: Secondary | ICD-10-CM | POA: Insufficient documentation

## 2015-04-01 DIAGNOSIS — F329 Major depressive disorder, single episode, unspecified: Secondary | ICD-10-CM | POA: Diagnosis not present

## 2015-04-01 DIAGNOSIS — Z87448 Personal history of other diseases of urinary system: Secondary | ICD-10-CM | POA: Diagnosis not present

## 2015-04-01 MED ORDER — BENZONATATE 100 MG PO CAPS: 100.0000 mg | ORAL_CAPSULE | Freq: Three times a day (TID) | ORAL | Status: DC

## 2015-04-01 MED ORDER — FLUORESCEIN SODIUM 1 MG OP STRP
1.0000 | ORAL_STRIP | Freq: Once | OPHTHALMIC | Status: AC
  Administered 2015-04-01: 1 via OPHTHALMIC
  Filled 2015-04-01: qty 1

## 2015-04-01 MED ORDER — TETRACAINE HCL 0.5 % OP SOLN
2.0000 [drp] | Freq: Once | OPHTHALMIC | Status: AC
  Administered 2015-04-01: 2 [drp] via OPHTHALMIC
  Filled 2015-04-01: qty 4

## 2015-04-01 MED ORDER — OXYMETAZOLINE HCL 0.05 % NA SOLN: 1.0000 | Freq: Two times a day (BID) | NASAL | Status: DC

## 2015-04-01 MED ORDER — ERYTHROMYCIN 5 MG/GM OP OINT: TOPICAL_OINTMENT | OPHTHALMIC | Status: DC

## 2015-04-01 MED ORDER — AMOXICILLIN-POT CLAVULANATE 875-125 MG PO TABS: 1.0000 | ORAL_TABLET | Freq: Two times a day (BID) | ORAL | Status: DC

## 2015-04-01 NOTE — ED Provider Notes (Signed)
CSN: [id number]     Arrival date & time 04/01/15  1533 History  By signing my name below, I, Julien Nordmann, attest that this documentation has been prepared under the direction and in the presence of Harlene Ramus, [place]. Electronically Signed: Julien Nordmann, ED Scribe. 04/01/2015. 4:33 PM.     Chief Complaint  Patient presents with  . Cough  . Nasal Congestion       The history is provided by the patient. No language interpreter was used.   HPI Comments: [name] is a 64 y.o. female who presents to the Emergency Department complaining of constant, gradual worsening cold symptoms onset about 3 weeks ago. Pt has been having associated congestion, chest tightness, dry cough, intermittent productive cough with yellow sputum, sinus headache. Pt endorses that her symptoms have caused her to have trouble sleeping at night. She also notes she believes she scratched her left cornea in her sleep last night. Pt has been taking Theraflu Max and Delsym to alleviate her symptoms with no relief. She denies any injury, fever, ear pain, watery eyes, eye drainage/matting, eye redness, sore throat, wheezing, nausea, vomiting, and visual changes. Pt does not wear contacts.  Past Medical History  Diagnosis Date  . Seizures ([place])   . Gastric ulcer   . MRSA (methicillin resistant staph aureus) culture positive   . Interstitial cystitis   . Depression   . Mood disorder ([place])   . Anxiety   . Acid reflux   . High cholesterol    Past Surgical History  Procedure Laterality Date  . Shoulder arthroscopy    . Rotator cuff repair      R side  . Cataract extraction    . Multiple tooth extractions     Family History  Problem Relation Age of Onset  . Adopted: Yes  . Family history unknown: Yes   Social History  Substance Use Topics  . Smoking status: Current Every Day Smoker -- 1.00 packs/day for 40 years    Types: Cigarettes  . Smokeless tobacco: Never Used  . Alcohol Use: No   OB History    No  data available     Review of Systems  Constitutional: Negative for fever.  HENT: Positive for congestion. Negative for ear pain and sore throat.   Eyes: Positive for pain. Negative for visual disturbance.  Respiratory: Positive for cough. Negative for wheezing.   Gastrointestinal: Negative for nausea, vomiting and abdominal pain.  Neurological: Positive for headaches.  All other systems reviewed and are negative.     Allergies  Haloperidol decanoate and Zithromax  Home Medications   Prior to Admission medications   Medication Sig Start Date End Date Taking? Authorizing Provider  amoxicillin-clavulanate (AUGMENTIN) 875-125 MG tablet Take 1 tablet by mouth every 12 (twelve) hours. 04/01/15   Nona Dell, PA-C  benzonatate (TESSALON) 100 MG capsule Take 1 capsule (100 mg total) by mouth every 8 (eight) hours. 04/01/15   Nona Dell, PA-C  clonazePAM (KLONOPIN) 1 MG tablet Take 1 mg by mouth at bedtime.    Historical Provider, MD  erythromycin ophthalmic ointment Place a 1/2 inch ribbon of ointment into the lower eyelid. 04/01/15   Nona Dell, PA-C  FLUoxetine (PROZAC) 20 MG capsule Take 20 mg by mouth daily. 12/04/14   Historical Provider, MD  lamoTRIgine (LAMICTAL) 100 MG tablet TAKE 1 TABLET EVERY MORNING AND 2 TABLETS EVERY NIGHT. 01/22/15   Penni Bombard, MD  mirtazapine (REMERON) 45 MG tablet  12/03/14  Historical Provider, MD  oxymetazoline (AFRIN NASAL SPRAY) 0.05 % nasal spray Place 1 spray into both nostrils 2 (two) times daily. 04/01/15   Nona Dell, PA-C  risperiDONE (RISPERDAL) 2 MG tablet Take 1 tablet by mouth at bedtime.  10/15/13   Historical Provider, MD  simvastatin (ZOCOR) 20 MG tablet Take 20 mg by mouth every evening.    Historical Provider, MD  traMADol (ULTRAM) 50 MG tablet Take 1 tablet (50 mg total) by mouth 4 (four) times daily. Patient not taking: Reported on 08/23/2014 04/11/14   Noland Fordyce, PA-C   traZODone (DESYREL) 100 MG tablet Take 100 mg by mouth at bedtime.    Historical Provider, MD   Triage vitals: BP 114/73 mmHg  Pulse 77  Temp(Src) 98.1 F (36.7 C) (Oral)  Resp 16  SpO2 94% Physical Exam  Constitutional: She appears well-developed and well-nourished. No distress.  HENT:  Head: Normocephalic and atraumatic.  Right Ear: Tympanic membrane normal.  Left Ear: Tympanic membrane normal.  Nose: Sinus tenderness present. Right sinus exhibits maxillary sinus tenderness. Right sinus exhibits no frontal sinus tenderness. Left sinus exhibits maxillary sinus tenderness. Left sinus exhibits no frontal sinus tenderness.  Mouth/Throat: Uvula is midline, oropharynx is clear and moist and mucous membranes are normal.  Eyes: Conjunctivae, EOM and lids are normal. Pupils are equal, round, and reactive to light. Lids are everted and swept, no foreign bodies found. Right eye exhibits no chemosis, no discharge, no exudate and no hordeolum. No foreign body present in the right eye. Left eye exhibits no chemosis, no discharge, no exudate and no hordeolum. No foreign body present in the left eye.  Slit lamp exam:      The left eye shows corneal abrasion and fluorescein uptake.  Neck: Normal range of motion.  Cardiovascular: Normal rate, regular rhythm and normal heart sounds.   Pulmonary/Chest: Effort normal and breath sounds normal. No respiratory distress.  Lymphadenopathy:    She has no cervical adenopathy.  Neurological: She is alert. Coordination normal.  Skin: No rash noted. She is not diaphoretic.  Psychiatric: She has a normal mood and affect. Her behavior is normal.  Nursing note and vitals reviewed.   ED Course  Procedures  DIAGNOSTIC STUDIES: Oxygen Saturation is 94% on RA, low by my interpretation.  COORDINATION OF CARE:  4:28 PM Discussed treatment plan with pt at bedside and pt agreed to plan.  Labs Review Labs Reviewed - No data to display  Imaging Review No results  found. I have personally reviewed and evaluated these images and lab results as part of my medical decision-making.  Filed Vitals:   04/01/15 1539  BP: 114/73  Pulse: 77  Temp: 98.1 F (36.7 C)  Resp: 16     MDM   Final diagnoses:  Corneal abrasion, left, initial encounter  Acute maxillary sinusitis, recurrence not specified   Patient presents with worsening cough, nasal congestion, sinus pressure for the past 3 weeks, no relief with OTC meds. She also reports she thinks she scratched her eye last night and has since been having a pain to her left eye. Patient does not wear contacts. VSS. Exam revealed bilateral maxillary sinus tenderness, rhinorrhea, lungs CTAB.  Eye exam revealed forcing uptake of left cornea consistent with corneal abrasion. I suspect patient's symptoms are likely due to to bacterial sinusitis considering the duration. Will plan to discharge patient home with antibiotics for her sinus infection and corneal abrasion along with symptomatic treatment. Advised patient to follow up with her  PCP.   Evaluation does not show pathology requring ongoing emergent intervention or admission. Pt is hemodynamically stable and mentating appropriately. Discussed findings/results and plan with patient/guardian, who agrees with plan. All questions answered. Return precautions discussed and outpatient follow up given.    I personally performed the services described in this documentation, which was scribed in my presence. The recorded information has been reviewed and is accurate.   [facility], [place] 04/01/15 1709  Charlesetta Shanks, MD 04/06/15 [phone]

## 2015-04-01 NOTE — ED Notes (Signed)
Pt c/o cough and nasal congestion x 1 month.  Pt states that it is causing her to have trouble sleeping so she decided to come to the ER.  Also thinks that she scratched her cornea of her left eye.  States that she doesn't shut her eyes all the way when she sleeps.

## 2015-04-01 NOTE — ED Notes (Signed)
Pt wears glasses but did not have them today; hx of cataract

## 2015-04-01 NOTE — Discharge Instructions (Signed)
Take your medications as prescribed. Follow-up with your primary care provider in 4-5 days. Return to the emergency department if symptoms worsen or new onset of fever, chest pain, difficulty breathing, visual changes, eye drainage.

## 2015-04-19 DIAGNOSIS — J189 Pneumonia, unspecified organism: Secondary | ICD-10-CM

## 2015-04-19 HISTORY — DX: Pneumonia, unspecified organism: J18.9

## 2015-05-19 DIAGNOSIS — F319 Bipolar disorder, unspecified: Secondary | ICD-10-CM | POA: Diagnosis not present

## 2015-07-23 ENCOUNTER — Ambulatory Visit: Payer: Medicare Other | Admitting: Diagnostic Neuroimaging

## 2015-08-17 ENCOUNTER — Ambulatory Visit: Payer: Medicare Other | Admitting: Diagnostic Neuroimaging

## 2015-08-18 DIAGNOSIS — F319 Bipolar disorder, unspecified: Secondary | ICD-10-CM | POA: Diagnosis not present

## 2016-01-05 ENCOUNTER — Encounter (HOSPITAL_COMMUNITY): Payer: Self-pay | Admitting: Emergency Medicine

## 2016-01-05 ENCOUNTER — Emergency Department (HOSPITAL_COMMUNITY)
Admission: EM | Admit: 2016-01-05 | Discharge: 2016-01-05 | Disposition: A | Discharge status: Home / Self Care | Payer: Commercial Managed Care - HMO | Attending: Emergency Medicine | Admitting: Emergency Medicine

## 2016-01-05 DIAGNOSIS — K0889 Other specified disorders of teeth and supporting structures: Secondary | ICD-10-CM | POA: Insufficient documentation

## 2016-01-05 DIAGNOSIS — Z79899 Other long term (current) drug therapy: Secondary | ICD-10-CM | POA: Diagnosis not present

## 2016-01-05 DIAGNOSIS — F17210 Nicotine dependence, cigarettes, uncomplicated: Secondary | ICD-10-CM | POA: Diagnosis not present

## 2016-01-05 MED ORDER — HYDROCODONE-ACETAMINOPHEN 5-325 MG PO TABS: 1.0000 | ORAL_TABLET | Freq: Four times a day (QID) | ORAL | 0 refills | Status: DC | PRN

## 2016-01-05 MED ORDER — PENICILLIN V POTASSIUM 500 MG PO TABS: 500.0000 mg | ORAL_TABLET | Freq: Four times a day (QID) | ORAL | 0 refills | Status: AC

## 2016-01-05 MED ORDER — PENICILLIN V POTASSIUM 500 MG PO TABS: 500.0000 mg | ORAL_TABLET | Freq: Four times a day (QID) | ORAL | 0 refills | Status: DC

## 2016-01-05 NOTE — ED Triage Notes (Signed)
Pt reports 4 day hx of sharp pain in r/upper mouth.

## 2016-01-05 NOTE — Discharge Instructions (Signed)
Medications: Penicillin, Norco  Treatment: Take penicillin as prescribed for 1 week. Make sure to finish all of this medication. Take 1-2 Norco every 4-6 hours as needed for severe pain. Do not drive or operate machinery when taking this medication and do not take with Tylenol, as this medication is Tylenol in it. You can alternate with ibuprofen as prescribed over-the-counter.  Follow-up: Please follow-up with a dentist as soon as possible, as this is the definitive treatment for your pain. Please return to emergency department if you develop any new or worsening symptoms.

## 2016-01-05 NOTE — ED Provider Notes (Signed)
[facility] DEPT Provider Note   CSN: [id number] Arrival date & time: 01/05/16  1527   By signing my name below, I, [name], attest that this documentation has been prepared under the direction and in the presence of Eliezer Mccoy, PA-C. Electronically Signed: Estanislado [name], Scribe. 01/05/2016. 4:21 PM.   History   Chief Complaint Chief Complaint  Patient presents with  . Dental Pain    pain in r/upper mouth    The history is provided by the patient. No language interpreter was used.   HPI Comments:  [name] is a 65 y.o. female with PMHx of GERD and HLD who presents to the Emergency Department complaining of constant, worsening pain to her R upper mouth x4 days.The pain began mildly 2 weeks ago. Pt describes the pain as "sharp." Pt states that the affected tooth is loose. Pt complains of associated generalized facial soreness on the R side. Pt has used ice and taken Advil every 4 hours with no relief. Pt denies drainage from the area, neck pain, fever, chest pain, SOB, abdominal pain, nausea, vomiting.   Past Medical History:  Diagnosis Date  . Acid reflux   . Anxiety   . Depression   . Gastric ulcer   . High cholesterol   . Interstitial cystitis   . Mood disorder ([place])   . MRSA (methicillin resistant staph aureus) culture positive   . Seizures [facility])     Patient Active Problem List   Diagnosis Date Noted  . Headache(784.0) 11/18/2013  . Seizure disorder ([place]) 11/18/2013  . 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 Cocaine abuse 02/09/2013  . Lethargy 02/09/2013  . SEBORRHEIC KERATOSIS 04/03/2007  . EPIDERMOID CYST, BACK 04/03/2007  . BREAST TENDERNESS 02/09/2007  . TOBACCO ABUSE 12/15/2006  . ALLERGIC RHINITIS, SEASONAL 12/15/2006  . GERD 12/15/2006  . DIVERTICULOSIS, COLON 12/15/2006  . [place] DISEASE, LUMBOSACRAL SPINE 12/15/2006  . ALCOHOL ABUSE, HX OF 12/15/2006  . 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 BARRETT'S  ESOPHAGUS, HX OF 12/15/2006  . HYPERLIPIDEMIA, MIXED 12/13/2006  . DEPRESSION 12/13/2006  . INTERSTITIAL CYSTITIS 12/13/2006  . 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 CATARACT EXTRACTION    . MULTIPLE TOOTH EXTRACTIONS    . ROTATOR CUFF REPAIR     R side  . SHOULDER ARTHROSCOPY      OB History    No data available       Home Medications    Prior to Admission medications   Medication Sig Start Date End Date Taking? Authorizing Provider  amoxicillin-clavulanate (AUGMENTIN) 875-125 MG tablet Take 1 tablet by mouth every 12 (twelve) hours. 04/01/15   Nona Dell, PA-C  benzonatate (TESSALON) 100 MG capsule Take 1 capsule (100 mg total) by mouth every 8 (eight) hours. 04/01/15   Nona Dell, PA-C  clonazePAM (KLONOPIN) 1 MG tablet Take 1 mg by mouth at bedtime.    Historical Provider, MD  erythromycin ophthalmic ointment Place a 1/2 inch ribbon of ointment into the lower eyelid. 04/01/15   Nona Dell, PA-C  FLUoxetine (PROZAC) 20 MG capsule Take 20 mg by mouth daily. 12/04/14   Historical Provider, MD  HYDROcodone-acetaminophen (NORCO/VICODIN) 5-325 MG tablet Take 1-2 tablets by mouth every 6 (six) hours as needed for severe pain. 01/05/16   Frederica Kuster, PA-C  lamoTRIgine (LAMICTAL) 100 MG tablet TAKE 1 TABLET EVERY MORNING AND 2 TABLETS EVERY NIGHT. 01/22/15   Vikram R  Penumalli, MD  mirtazapine (REMERON) 45 MG tablet  12/03/14   Historical Provider, MD  oxymetazoline (AFRIN NASAL SPRAY) 0.05 % nasal spray Place 1 spray into both nostrils 2 (two) times daily. 04/01/15   Nona Dell, PA-C  penicillin v potassium (VEETID) 500 MG tablet Take 1 tablet (500 mg total) by mouth 4 (four) times daily. 01/05/16 01/12/16  Bea Graff Paula Busenbark, PA-C  risperiDONE (RISPERDAL) 2 MG tablet Take 1 tablet by mouth at bedtime.  10/15/13   Historical Provider, MD  simvastatin (ZOCOR) 20 MG tablet Take 20  mg by mouth every evening.    Historical Provider, MD  traMADol (ULTRAM) 50 MG tablet Take 1 tablet (50 mg total) by mouth 4 (four) times daily. Patient not taking: Reported on 08/23/2014 04/11/14   Noland Fordyce, PA-C  traZODone (DESYREL) 100 MG tablet Take 100 mg by mouth at bedtime.    Historical Provider, MD    Family History Family History  Problem Relation Age of Onset  . Adopted: Yes  . Family history unknown: Yes    Social History Social History  Substance Use Topics  . Smoking status: Current Every Day Smoker    Packs/day: 1.00    Years: 40.00    Types: Cigarettes  . Smokeless tobacco: Never Used  . Alcohol use No     Allergies   Haloperidol decanoate and Zithromax [azithromycin]   Review of Systems Review of Systems  Constitutional: Negative for chills and fever.  HENT: Positive for dental problem. Negative for facial swelling and sore throat.        Pos. for generalized facial soreness  Respiratory: Negative for shortness of breath.   Cardiovascular: Negative for chest pain.  Gastrointestinal: Negative for abdominal pain, nausea and vomiting.  Skin: Negative for rash and wound.  Psychiatric/Behavioral: The patient is not nervous/anxious.      Physical Exam Updated Vital Signs BP 107/70 (BP Location: Left Arm)   Pulse 74   Temp 98.2 F (36.8 C) (Oral)   Resp 18   Wt 58.5 kg   SpO2 94%   BMI 20.82 kg/m   Physical Exam  Constitutional: She appears well-developed and well-nourished. No distress.  HENT:  Head: Normocephalic and atraumatic.    Mouth/Throat: Oropharynx is clear and moist. No oropharyngeal exudate.    Generally poor dentition  Eyes: Conjunctivae are normal. Pupils are equal, round, and reactive to light. Right eye exhibits no discharge. Left eye exhibits no discharge. No scleral icterus.  Neck: Normal range of motion. Neck supple. No thyromegaly present.  Cardiovascular: Normal rate, regular rhythm, normal heart sounds and intact  distal pulses.  Exam reveals no gallop and no friction rub.   No murmur heard. Pulmonary/Chest: Effort normal and breath sounds normal. No stridor. No respiratory distress. She has no wheezes. She has no rales.  Abdominal: Soft. Bowel sounds are normal. She exhibits no distension. There is no tenderness. There is no rebound and no guarding.  Musculoskeletal: She exhibits no edema.  Lymphadenopathy:    She has no cervical adenopathy.  Neurological: She is alert. Coordination normal.  Skin: Skin is warm and dry. No rash noted. She is not diaphoretic. No pallor.  Psychiatric: She has a normal mood and affect.  Nursing note and vitals reviewed.    ED Treatments / Results  DIAGNOSTIC STUDIES:  Oxygen Saturation is 94% on RA, adequate by my interpretation.    COORDINATION OF CARE:  4:21 PM Discussed treatment plan with pt at bedside and pt agreed to  plan.   Labs (all labs ordered are listed, but only abnormal results are displayed) Labs Reviewed - No data to display  EKG  EKG Interpretation None       Radiology No results found.  Procedures Procedures (including critical care time)  Medications Ordered in ED Medications - No data to display   Initial Impression / Assessment and Plan / ED Course  I have reviewed the triage vital signs and the nursing notes.  Pertinent labs & imaging results that were available during my care of the patient were reviewed by me and considered in my medical decision making (see chart for details).  Clinical Course    Patient with dentalgia.  No abscess requiring immediate incision and drainage.  Exam not concerning for Ludwig's angina or pharyngeal abscess.  Will treat with Penicillin, Norco. Pt instructed to follow-up with dentist. Patient given dental resource guide.  Discussed return precautions. Patient vitals stable throughout ED course and discharged in satisfactory condition.   Final Clinical Impressions(s) / ED Diagnoses    Final diagnoses:  Pain, dental    New Prescriptions Discharge Medication List as of 01/05/2016  4:30 PM    START taking these medications   Details  HYDROcodone-acetaminophen (NORCO/VICODIN) 5-325 MG tablet Take 1-2 tablets by mouth every 6 (six) hours as needed for severe pain., Starting Tue 01/05/2016, Print    penicillin v potassium (VEETID) 500 MG tablet Take 1 tablet (500 mg total) by mouth 4 (four) times daily., Starting Tue 01/05/2016, Until Tue 01/12/2016, Print      I personally performed the services described in this documentation, which was scribed in my presence. The recorded information has been reviewed and is accurate.     Frederica Kuster, PA-C 01/05/16 1644    Leo Grosser, MD 01/06/16 1310

## 2016-02-01 ENCOUNTER — Other Ambulatory Visit: Payer: Self-pay | Admitting: Diagnostic Neuroimaging

## 2016-02-03 DIAGNOSIS — F319 Bipolar disorder, unspecified: Secondary | ICD-10-CM | POA: Diagnosis not present

## 2016-02-26 ENCOUNTER — Emergency Department (HOSPITAL_COMMUNITY)
Admission: EM | Admit: 2016-02-26 | Discharge: 2016-02-26 | Disposition: A | Discharge status: Home / Self Care | Payer: Medicare Other | Attending: Emergency Medicine | Admitting: Emergency Medicine

## 2016-02-26 ENCOUNTER — Encounter (HOSPITAL_COMMUNITY): Payer: Self-pay | Admitting: Emergency Medicine

## 2016-02-26 ENCOUNTER — Emergency Department (HOSPITAL_COMMUNITY): Payer: Medicare Other

## 2016-02-26 DIAGNOSIS — Z79899 Other long term (current) drug therapy: Secondary | ICD-10-CM | POA: Diagnosis not present

## 2016-02-26 DIAGNOSIS — F17210 Nicotine dependence, cigarettes, uncomplicated: Secondary | ICD-10-CM | POA: Insufficient documentation

## 2016-02-26 DIAGNOSIS — R05 Cough: Secondary | ICD-10-CM | POA: Diagnosis present

## 2016-02-26 DIAGNOSIS — J189 Pneumonia, unspecified organism: Secondary | ICD-10-CM | POA: Diagnosis not present

## 2016-02-26 MED ORDER — LEVOFLOXACIN 750 MG PO TABS: 750.0000 mg | ORAL_TABLET | Freq: Every day | ORAL | 0 refills | Status: DC

## 2016-02-26 MED ORDER — GUAIFENESIN-CODEINE 100-10 MG/5ML PO SOLN: 5.0000 mL | Freq: Three times a day (TID) | ORAL | 0 refills | Status: DC | PRN

## 2016-02-26 NOTE — ED Triage Notes (Signed)
Pt c/o yellow rhinorrhea, sinus pain, headache, productive cough with yellow mucus x 5 weeks. No sinus tenderness to palpation. Rhonchi to Left upper lobe.

## 2016-02-26 NOTE — ED Provider Notes (Signed)
[facility] DEPT Provider Note   CSN: [id number] Arrival date & time: 02/26/16  1538   By signing my name below, I, [name], attest that this documentation has been prepared under the direction and in the presence of [employer], PA-C. Electronically Signed: Neta [name], ED Scribe. 02/26/2016. 4:33 PM.   History   Chief Complaint Chief Complaint  Patient presents with  . Facial Pain  . Cough   The history is provided by the patient. No language interpreter was used.   HPI Comments:  [name] is a 64 y.o. female with PMHx of pneumonia and seizures who presents to the Emergency Department complaining of multiple URI symptoms x 5 weeks. Pt complains of associated yellow rhinorrhea, generalized fatigue, sinus pain, headache, SOB, and productive cough with yellow sputum. Pt reports that her symptoms have remained constant for 5 weeks. Pt reports that she previously had pneumonia 10 years ago. Pt is a smoker. No alleviating factors noted. Pt denies fever, chest pain. Pt reports chronic SOB from smoking, reports this is not worse than usual.    Past Medical History:  Diagnosis Date  . Acid reflux   . Anxiety   . Depression   . Gastric ulcer   . High cholesterol   . Interstitial cystitis   . Mood disorder ([place])   . MRSA (methicillin resistant staph aureus) culture positive   . Seizures [facility])     Patient Active Problem List   Diagnosis Date Noted  . Headache(784.0) 11/18/2013  . Seizure disorder ([place]) 11/18/2013  . 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 Cocaine abuse 02/09/2013  . Lethargy 02/09/2013  . SEBORRHEIC KERATOSIS 04/03/2007  . EPIDERMOID CYST, BACK 04/03/2007  . BREAST TENDERNESS 02/09/2007  . TOBACCO ABUSE 12/15/2006  . ALLERGIC RHINITIS, SEASONAL 12/15/2006  . GERD 12/15/2006  . DIVERTICULOSIS, COLON 12/15/2006  . [place] DISEASE, LUMBOSACRAL SPINE 12/15/2006  . ALCOHOL ABUSE, HX OF 12/15/2006  . COLONIC POLYPS,  ADENOMATOUS, HX OF 12/15/2006  . BARRETT'S ESOPHAGUS, HX OF 12/15/2006  . HYPERLIPIDEMIA, MIXED 12/13/2006  . DEPRESSION 12/13/2006  . INTERSTITIAL CYSTITIS 12/13/2006  . 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 CATARACT EXTRACTION    . MULTIPLE TOOTH EXTRACTIONS    . ROTATOR CUFF REPAIR     R side  . SHOULDER ARTHROSCOPY      OB History    No data available       Home Medications    Prior to Admission medications   Medication Sig Start Date End Date Taking? Authorizing Provider  amoxicillin-clavulanate (AUGMENTIN) 875-125 MG tablet Take 1 tablet by mouth every 12 (twelve) hours. 04/01/15   Nona Dell, PA-C  benzonatate (TESSALON) 100 MG capsule Take 1 capsule (100 mg total) by mouth every 8 (eight) hours. 04/01/15   Nona Dell, PA-C  clonazePAM (KLONOPIN) 1 MG tablet Take 1 mg by mouth at bedtime.    Historical Provider, MD  erythromycin ophthalmic ointment Place a 1/2 inch ribbon of ointment into the lower eyelid. 04/01/15   Nona Dell, PA-C  FLUoxetine (PROZAC) 20 MG capsule Take 20 mg by mouth daily. 12/04/14   Historical Provider, MD  guaiFENesin-codeine 100-10 MG/5ML syrup Take 5 mLs by mouth 3 (three) times daily as needed for cough. 02/26/16   Okey Regal, PA-C  HYDROcodone-acetaminophen (NORCO/VICODIN) 5-325 MG tablet Take 1-2 tablets by mouth every 6 (six) hours as needed for severe pain. 01/05/16   Alexandra  M Law, PA-C  lamoTRIgine (LAMICTAL) 100 MG tablet TAKE 1 TABLET BY MOUTH EVERY MORNING AND 2 TABLETS IN THE EVENING 02/01/16   Penni Bombard, MD  levofloxacin (LEVAQUIN) 750 MG tablet Take 1 tablet (750 mg total) by mouth daily. 02/26/16   Okey Regal, PA-C  mirtazapine (REMERON) 45 MG tablet  12/03/14   Historical Provider, MD  oxymetazoline (AFRIN NASAL SPRAY) 0.05 % nasal spray Place 1 spray into both nostrils 2 (two) times daily. 04/01/15   Nona Dell, PA-C  risperiDONE (RISPERDAL) 2 MG tablet Take 1 tablet by mouth at bedtime.  10/15/13   Historical Provider, MD  simvastatin (ZOCOR) 20 MG tablet Take 20 mg by mouth every evening.    Historical Provider, MD  traMADol (ULTRAM) 50 MG tablet Take 1 tablet (50 mg total) by mouth 4 (four) times daily. Patient not taking: Reported on 08/23/2014 04/11/14   Noland Fordyce, PA-C  traZODone (DESYREL) 100 MG tablet Take 100 mg by mouth at bedtime.    Historical Provider, MD    Family History Family History  Problem Relation Age of Onset  . Adopted: Yes  . Family history unknown: Yes    Social History Social History  Substance Use Topics  . Smoking status: Current Every Day Smoker    Packs/day: 1.00    Years: 40.00    Types: Cigarettes  . Smokeless tobacco: Never Used  . Alcohol use No     Allergies   Haloperidol decanoate and Zithromax [azithromycin]   Review of Systems Review of Systems 10 systems reviewed and all are negative for acute change except as noted in the HPI.    Physical Exam Updated Vital Signs BP 119/86 (BP Location: Right Arm)   Pulse 72   Temp 98.4 F (36.9 C) (Oral)   Resp 20   Wt 56.7 kg   SpO2 97%   BMI 20.18 kg/m   Physical Exam  Constitutional: She appears well-developed and well-nourished. No distress.  HENT:  Head: Normocephalic and atraumatic.  Right Ear: Tympanic membrane and external ear normal.  Left Ear: Tympanic membrane and external ear normal.  Nose: Nose normal.  Mouth/Throat: Oropharynx is clear and moist and mucous membranes are normal. No oropharyngeal exudate, posterior oropharyngeal edema, posterior oropharyngeal erythema or tonsillar abscesses.  Eyes: Conjunctivae are normal.  Cardiovascular: Normal rate.   Pulmonary/Chest: Effort normal and breath sounds normal. No respiratory distress.  Abdominal: She exhibits no distension.  Neurological: She is alert.  Skin: Skin is warm and dry.  Psychiatric: She has a normal mood  and affect.  Nursing note and vitals reviewed.    ED Treatments / Results  DIAGNOSTIC STUDIES:  Oxygen Saturation is 97% on RA, normal by my interpretation.    COORDINATION OF CARE:  4:33 PM Discussed treatment plan with pt at bedside and pt agreed to plan.   Labs (all labs ordered are listed, but only abnormal results are displayed) Labs Reviewed - No data to display  EKG  EKG Interpretation None       Radiology Dg Chest 2 View  Result Date: 02/26/2016 CLINICAL DATA:  Cough and congestion. EXAM: CHEST  2 VIEW COMPARISON:  Aug 23, 2014 FINDINGS: Linear scar or atelectasis in the medial right lung base. Mild opacity posteriorly on the lateral view may represent atelectasis or infiltrate. Recommend follow-up to resolution. Hyperinflation of lungs remains. No other interval changes. IMPRESSION: Opacity posteriorly on the lateral view may represent atelectasis or infiltrate, not seen in May of 2016.  Recommend follow-up to resolution. Electronically Signed   By: Dorise Bullion III M.D   On: 02/26/2016 16:15    Procedures Procedures (including critical care time)  Medications Ordered in ED Medications - No data to display   Initial Impression / Assessment and Plan / ED Course  I have reviewed the triage vital signs and the nursing notes.  Pertinent labs & imaging results that were available during my care of the patient were reviewed by me and considered in my medical decision making (see chart for details).  Clinical Course      Final Clinical Impressions(s) / ED Diagnoses   Final diagnoses:  Community acquired pneumonia of left lung, unspecified part of lung  Labs:   Imaging: DG chest 2 view  Consults:   Therapeutics:   Discharge Meds: Levaquin  Assessment/Plan:   83 old female presents today with likely pneumonia. Patient is afebrile and nontoxic. She has clear lung sounds and good oxygen saturation. Patient hasn't had significant worsening of symptoms.  Although I did not assess with laboratory analysis patient's curb score is 0 ( no BUN). She is a candidate for outpatient treatment. She will be instructed to take antibiotics, return to the emergency room if she has any worsening signs or symptoms. Patient will need repeat chest x-ray to ensure resolution. She verbalized understanding and agreement to today's appointment and no further questions or concerns at discharge   New Prescriptions New Prescriptions   GUAIFENESIN-CODEINE 100-10 MG/5ML SYRUP    Take 5 mLs by mouth 3 (three) times daily as needed for cough.   LEVOFLOXACIN (LEVAQUIN) 750 MG TABLET    Take 1 tablet (750 mg total) by mouth daily.    I personally performed the services described in this documentation, which was scribed in my presence. The recorded information has been reviewed and is accurate.    Okey Regal, PA-C 02/26/16 1656    Dorie Rank, MD 02/29/16 [phone]

## 2016-02-26 NOTE — ED Notes (Signed)
Patient transported to X-ray 

## 2016-02-26 NOTE — ED Notes (Signed)
Pt ambulatory and independent at discharge.  Verbalized understanding of discharge instructions 

## 2016-02-26 NOTE — Discharge Instructions (Signed)
Please read attached information. If you experience any new or worsening signs or symptoms please return to the emergency room for evaluation. Please follow-up with your primary care provider or specialist as discussed. Please use medication prescribed only as directed and discontinue taking if you have any concerning signs or symptoms.   °

## 2016-03-02 ENCOUNTER — Telehealth: Payer: Self-pay | Admitting: *Deleted

## 2016-03-02 ENCOUNTER — Other Ambulatory Visit: Payer: Self-pay | Admitting: Diagnostic Neuroimaging

## 2016-03-02 ENCOUNTER — Telehealth: Payer: Self-pay | Admitting: Diagnostic Neuroimaging

## 2016-03-02 MED ORDER — LAMOTRIGINE 100 MG PO TABS: ORAL_TABLET | ORAL | 2 refills | Status: DC

## 2016-03-02 NOTE — Telephone Encounter (Signed)
LVM advising patient she needs to call and schedule a follow up with our phone staff. Advised her that unless she is getting refills for Lamictal from another physician, Dr Leta Baptist needs to see her yearly.  Left name, number.

## 2016-03-02 NOTE — Telephone Encounter (Signed)
Patient called to request refill of lamoTRIgine (LAMICTAL) 100 MG tablet

## 2016-03-02 NOTE — Telephone Encounter (Signed)
[place], spoke with Sharyn Lull. Pharmacy received faxed prescription for one month Lamictal. This RN advised her the patient now has a follow up on 05/18/16. Gave verbal to refill x 2. Sharyn Lull verbalized understanding, appreciation.

## 2016-03-07 ENCOUNTER — Emergency Department (HOSPITAL_COMMUNITY): Payer: Medicare Other

## 2016-03-07 ENCOUNTER — Encounter (HOSPITAL_COMMUNITY): Payer: Self-pay

## 2016-03-07 ENCOUNTER — Emergency Department (HOSPITAL_COMMUNITY)
Admission: EM | Admit: 2016-03-07 | Discharge: 2016-03-07 | Disposition: A | Discharge status: Home / Self Care | Payer: Medicare Other | Attending: Emergency Medicine | Admitting: Emergency Medicine

## 2016-03-07 DIAGNOSIS — F17210 Nicotine dependence, cigarettes, uncomplicated: Secondary | ICD-10-CM | POA: Insufficient documentation

## 2016-03-07 DIAGNOSIS — J40 Bronchitis, not specified as acute or chronic: Secondary | ICD-10-CM | POA: Insufficient documentation

## 2016-03-07 DIAGNOSIS — R05 Cough: Secondary | ICD-10-CM | POA: Diagnosis present

## 2016-03-07 MED ORDER — HYDROCOD POLST-CPM POLST ER 10-8 MG/5ML PO SUER: 5.0000 mL | Freq: Two times a day (BID) | ORAL | 0 refills | Status: DC

## 2016-03-07 MED ORDER — PREDNISONE 10 MG (21) PO TBPK: 10.0000 mg | ORAL_TABLET | Freq: Every day | ORAL | 0 refills | Status: DC

## 2016-03-07 MED ORDER — ALBUTEROL SULFATE HFA 108 (90 BASE) MCG/ACT IN AERS: 2.0000 | INHALATION_SPRAY | Freq: Four times a day (QID) | RESPIRATORY_TRACT | 2 refills | Status: DC | PRN

## 2016-03-07 NOTE — ED Provider Notes (Signed)
[place] DEPT Provider Note   CSN: [id number] Arrival date & time: 03/07/16  1447     History   Chief Complaint Chief Complaint  Patient presents with  . Cough  . Headache    HPI [name] is a 65 y.o. female.  65 year old female presents with 6 weeks of nonproductive cough without anginal or CHF symptoms. Patient has a 60-pack-year history of tobacco use. Was diagnosed with pneumonia one half weeks ago and completed treatment. Since that time continues to note a cough worse in the morning. No fever or vomiting noted. No lower extremity swelling. Has had some audible wheezing which she is not using medications. Denies any prior diagnosis of COPD. No new treatments used prior to arrival. Nothing makes her symptoms better.      Past Medical History:  Diagnosis Date  . Acid reflux   . Anxiety   . Depression   . Gastric ulcer   . High cholesterol   . Interstitial cystitis   . Mood disorder ([place])   . MRSA (methicillin resistant staph aureus) culture positive   . Seizures [facility])     Patient Active Problem List   Diagnosis Date Noted  . Headache(784.0) 11/18/2013  . Seizure disorder ([place]) 11/18/2013  . 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 Cocaine abuse 02/09/2013  . Lethargy 02/09/2013  . SEBORRHEIC KERATOSIS 04/03/2007  . EPIDERMOID CYST, BACK 04/03/2007  . BREAST TENDERNESS 02/09/2007  . TOBACCO ABUSE 12/15/2006  . ALLERGIC RHINITIS, SEASONAL 12/15/2006  . GERD 12/15/2006  . DIVERTICULOSIS, COLON 12/15/2006  . [place] DISEASE, LUMBOSACRAL SPINE 12/15/2006  . ALCOHOL ABUSE, HX OF 12/15/2006  . 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 BARRETT'S ESOPHAGUS, HX OF 12/15/2006  . HYPERLIPIDEMIA, MIXED 12/13/2006  . DEPRESSION 12/13/2006  . INTERSTITIAL CYSTITIS 12/13/2006  . 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 CATARACT EXTRACTION    . MULTIPLE TOOTH EXTRACTIONS    .  ROTATOR CUFF REPAIR     R side  . SHOULDER ARTHROSCOPY      OB History    No data available       Home Medications    Prior to Admission medications   Medication Sig Start Date End Date Taking? Authorizing Provider  clonazePAM (KLONOPIN) 0.5 MG tablet Take 0.5 mg by mouth at bedtime as needed for anxiety.  03/03/16  Yes Historical Provider, MD  lamoTRIgine (LAMICTAL) 100 MG tablet Take 100-200 mg by mouth 2 (two) times daily. Take 1 tablet (100 mg) in the  Morning and Take 2 tablets (200 mg) in evening 01/12/16  Yes Historical Provider, MD  mirtazapine (REMERON) 45 MG tablet Take 45 mg by mouth at bedtime.  12/03/14  Yes Historical Provider, MD  amoxicillin-clavulanate (AUGMENTIN) 875-125 MG tablet Take 1 tablet by mouth every 12 (twelve) hours. 04/01/15   Nona Dell, PA-C  ARIPiprazole (ABILIFY) 10 MG tablet Take 10 mg by mouth daily.  03/02/16   Historical Provider, MD  benzonatate (TESSALON) 100 MG capsule Take 1 capsule (100 mg total) by mouth every 8 (eight) hours. 04/01/15   Nona Dell, PA-C  clonazePAM (KLONOPIN) 1 MG tablet Take 1 mg by mouth 2 (two) times daily as needed for anxiety.     Historical Provider, MD  erythromycin ophthalmic ointment Place a 1/2 inch ribbon of ointment into the lower eyelid. 04/01/15   Nona Dell, PA-C  FLUoxetine (PROZAC) 20 MG capsule Take 20 mg by  mouth daily. 12/04/14   Historical Provider, MD  FLUoxetine (PROZAC) 40 MG capsule Take 40 mg by mouth daily.  03/02/16   Historical Provider, MD  guaiFENesin-codeine 100-10 MG/5ML syrup Take 5 mLs by mouth 3 (three) times daily as needed for cough. Patient not taking: Reported on 03/07/2016 02/26/16   Okey Regal, PA-C  HYDROcodone-acetaminophen (NORCO/VICODIN) 5-325 MG tablet Take 1-2 tablets by mouth every 6 (six) hours as needed for severe pain. 01/05/16   Frederica Kuster, PA-C  levofloxacin (LEVAQUIN) 750 MG tablet Take 1 tablet (750 mg total) by mouth daily.  02/26/16   Okey Regal, PA-C  oxymetazoline (AFRIN NASAL SPRAY) 0.05 % nasal spray Place 1 spray into both nostrils 2 (two) times daily. 04/01/15   Nona Dell, PA-C  risperiDONE (RISPERDAL) 2 MG tablet Take 1 tablet by mouth at bedtime.  10/15/13   Historical Provider, MD  simvastatin (ZOCOR) 20 MG tablet Take 20 mg by mouth every evening.    Historical Provider, MD  traMADol (ULTRAM) 50 MG tablet Take 1 tablet (50 mg total) by mouth 4 (four) times daily. Patient not taking: Reported on 08/23/2014 04/11/14   Noland Fordyce, PA-C  traZODone (DESYREL) 100 MG tablet Take 100 mg by mouth at bedtime.    Historical Provider, MD  traZODone (DESYREL) 150 MG tablet Take 150 mg by mouth at bedtime.  03/02/16   Historical Provider, MD    Family History Family History  Problem Relation Age of Onset  . Adopted: Yes  . Family history unknown: Yes    Social History Social History  Substance Use Topics  . Smoking status: Current Every Day Smoker    Packs/day: 1.00    Years: 40.00    Types: Cigarettes  . Smokeless tobacco: Never Used  . Alcohol use No     Allergies   Haloperidol decanoate and Zithromax [azithromycin]   Review of Systems Review of Systems  All other systems reviewed and are negative.    Physical Exam Updated Vital Signs BP 120/86 (BP Location: Right Arm)   Pulse 73   Temp 98.7 F (37.1 C) (Oral)   Resp 18   SpO2 95%   Physical Exam  Constitutional: She is oriented to person, place, and time. She appears well-developed and well-nourished.  Non-toxic appearance. No distress.  HENT:  Head: Normocephalic and atraumatic.  Eyes: Conjunctivae, EOM and lids are normal. Pupils are equal, round, and reactive to light.  Neck: Normal range of motion. Neck supple. No tracheal deviation present. No thyroid mass present.  Cardiovascular: Normal rate, regular rhythm and normal heart sounds.  Exam reveals no gallop.   No murmur heard. Pulmonary/Chest: Effort normal.  No stridor. No respiratory distress. She has decreased breath sounds. She has no wheezes. She has no rhonchi. She has no rales.  Abdominal: Soft. Normal appearance and bowel sounds are normal. She exhibits no distension. There is no tenderness. There is no rebound and no CVA tenderness.  Musculoskeletal: Normal range of motion. She exhibits no edema or tenderness.  Neurological: She is alert and oriented to person, place, and time. She has normal strength. No cranial nerve deficit or sensory deficit. GCS eye subscore is 4. GCS verbal subscore is 5. GCS motor subscore is 6.  Skin: Skin is warm and dry. No abrasion and no rash noted.  Psychiatric: She has a normal mood and affect. Her speech is normal and behavior is normal.  Nursing note and vitals reviewed.    ED Treatments / Results  Labs (  all labs ordered are listed, but only abnormal results are displayed) Labs Reviewed - No data to display  EKG  EKG Interpretation None       Radiology Dg Chest 2 View  Result Date: 03/07/2016 CLINICAL DATA:  Cough and headache.  History of pneumonia treatment. EXAM: CHEST  2 VIEW COMPARISON:  02/26/2016 FINDINGS: Both lungs are clear. Negative for airspace disease or pulmonary edema. 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 Atherosclerotic calcifications at the aortic arch. Probable scarring along the medial right lung base is unchanged. No pleural effusions. Heart size is within normal limits. Probable focus of sclerosis involving the right second rib. No acute bone abnormality. IMPRESSION: No active cardiopulmonary disease. Right-sided aortic arch.  Aortic atherosclerosis. Electronically Signed   By: Markus Daft M.D.   On: 03/07/2016 15:36    Procedures Procedures (including critical care time)  Medications Ordered in ED Medications - No data to display   Initial Impression / Assessment and Plan / ED Course  I have reviewed the triage vital signs and the nursing notes.  Pertinent labs & imaging  results that were available during my care of the patient were reviewed by me and considered in my medical decision making (see chart for details).  Clinical Course     Chest x-ray without acute findings for pneumonia. Suspect COPD.. Will place on prednisone and give albuterol inhaler as well as referral for primary care  Final Clinical Impressions(s) / ED Diagnoses   Final diagnoses:  None    New Prescriptions New Prescriptions   No medications on file     Lacretia Leigh, MD 03/07/16 1629

## 2016-03-07 NOTE — ED Triage Notes (Signed)
Pt has had symptoms x 6 weeks.  Cough/congestion/headache.  Pt was tx for PNA last week.  Has finished abx therapy and states she still "has pna".  Symptoms not relieved post tx.

## 2016-03-23 ENCOUNTER — Emergency Department (HOSPITAL_COMMUNITY)
Admission: EM | Admit: 2016-03-23 | Discharge: 2016-03-23 | Disposition: A | Discharge status: Home / Self Care | Payer: Medicare Other | Attending: Emergency Medicine | Admitting: Emergency Medicine

## 2016-03-23 DIAGNOSIS — R059 Cough, unspecified: Secondary | ICD-10-CM

## 2016-03-23 DIAGNOSIS — J40 Bronchitis, not specified as acute or chronic: Secondary | ICD-10-CM | POA: Diagnosis not present

## 2016-03-23 DIAGNOSIS — R05 Cough: Secondary | ICD-10-CM

## 2016-03-23 DIAGNOSIS — Z79899 Other long term (current) drug therapy: Secondary | ICD-10-CM | POA: Diagnosis not present

## 2016-03-23 DIAGNOSIS — F17210 Nicotine dependence, cigarettes, uncomplicated: Secondary | ICD-10-CM | POA: Insufficient documentation

## 2016-03-23 MED ORDER — PROMETHAZINE-DM 6.25-15 MG/5ML PO SYRP: 5.0000 mL | ORAL_SOLUTION | Freq: Four times a day (QID) | ORAL | 0 refills | Status: DC | PRN

## 2016-03-23 NOTE — ED Provider Notes (Signed)
[place] DEPT Provider Note   CSN: [id number] Arrival date & time: 03/23/16  1511  By signing my name below, I, Sonum Patel, attest that this documentation has been prepared under the direction and in the presence of Rashaud Ybarbo, [place]. Electronically Signed: Sonum Patel, Education administrator. 03/23/16. 4:25 PM.  History   Chief Complaint Chief Complaint  Patient presents with  . Cough    The history is provided by the patient. No language interpreter was used.     HPI Comments: Pietrina Dunlevy is a 65 y.o. female who presents to the Emergency Department complaining of a persistent, unchanged cough productive of yellow sputum for the past 2 months. She has been seen in the ED twice since this episode started and has been treated with Levaquin for PNA and then with prednisone for bronchitis. She states the steroids were helpful but did not resolve the coughing. She states she was unable to afford the ventolin inhaler or cough syrup. States her cough has been persistent. It is now a dry cough. Denies chest pain or SOB. Denies fever, chills, night sweats, abdominal pain, n/v/d. She is a 1 ppd smoker for the past ~40 years.    Past Medical History:  Diagnosis Date  . Acid reflux   . Anxiety   . Depression   . Gastric ulcer   . High cholesterol   . Interstitial cystitis   . Mood disorder ([place])   . MRSA (methicillin resistant staph aureus) culture positive   . Seizures [facility])     Patient Active Problem List   Diagnosis Date Noted  . Headache(784.0) 11/18/2013  . Seizure disorder ([place]) 11/18/2013  . 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 Cocaine abuse 02/09/2013  . Lethargy 02/09/2013  . SEBORRHEIC KERATOSIS 04/03/2007  . EPIDERMOID CYST, BACK 04/03/2007  . BREAST TENDERNESS 02/09/2007  . TOBACCO ABUSE 12/15/2006  . ALLERGIC RHINITIS, SEASONAL 12/15/2006  . GERD 12/15/2006  . DIVERTICULOSIS, COLON 12/15/2006  . [place] DISEASE, LUMBOSACRAL SPINE 12/15/2006  . ALCOHOL ABUSE,  HX OF 12/15/2006  . 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 BARRETT'S ESOPHAGUS, HX OF 12/15/2006  . HYPERLIPIDEMIA, MIXED 12/13/2006  . DEPRESSION 12/13/2006  . INTERSTITIAL CYSTITIS 12/13/2006  . 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 CATARACT EXTRACTION    . MULTIPLE TOOTH EXTRACTIONS    . ROTATOR CUFF REPAIR     R side  . SHOULDER ARTHROSCOPY      OB History    No data available       Home Medications    Prior to Admission medications   Medication Sig Start Date End Date Taking? Authorizing Provider  albuterol (PROVENTIL HFA;VENTOLIN HFA) 108 (90 Base) MCG/ACT inhaler Inhale 2 puffs into the lungs every 6 (six) hours as needed for wheezing or shortness of breath. 03/07/16   Lacretia Leigh, MD  amoxicillin-clavulanate (AUGMENTIN) 875-125 MG tablet Take 1 tablet by mouth every 12 (twelve) hours. 04/01/15   Nona Dell, PA-C  ARIPiprazole (ABILIFY) 10 MG tablet Take 10 mg by mouth daily.  03/02/16   Historical Provider, MD  benzonatate (TESSALON) 100 MG capsule Take 1 capsule (100 mg total) by mouth every 8 (eight) hours. 04/01/15   Nona Dell, PA-C  chlorpheniramine-HYDROcodone [facility]) 10-8 MG/5ML SUER Take 5 mLs by mouth 2 (two) times daily. 03/07/16   Lacretia Leigh, MD  clonazePAM (KLONOPIN) 0.5 MG tablet Take 0.5 mg by mouth at bedtime as needed  for anxiety.  03/03/16   Historical Provider, MD  clonazePAM (KLONOPIN) 1 MG tablet Take 1 mg by mouth 2 (two) times daily as needed for anxiety.     Historical Provider, MD  erythromycin ophthalmic ointment Place a 1/2 inch ribbon of ointment into the lower eyelid. 04/01/15   Nona Dell, PA-C  FLUoxetine (PROZAC) 20 MG capsule Take 20 mg by mouth daily. 12/04/14   Historical Provider, MD  FLUoxetine (PROZAC) 40 MG capsule Take 40 mg by mouth daily.  03/02/16   Historical Provider, MD  guaiFENesin-codeine 100-10  MG/5ML syrup Take 5 mLs by mouth 3 (three) times daily as needed for cough. Patient not taking: Reported on 03/07/2016 02/26/16   Okey Regal, PA-C  HYDROcodone-acetaminophen (NORCO/VICODIN) 5-325 MG tablet Take 1-2 tablets by mouth every 6 (six) hours as needed for severe pain. 01/05/16   Frederica Kuster, PA-C  lamoTRIgine (LAMICTAL) 100 MG tablet Take 100-200 mg by mouth 2 (two) times daily. Take 1 tablet (100 mg) in the  Morning and Take 2 tablets (200 mg) in evening 01/12/16   Historical Provider, MD  levofloxacin (LEVAQUIN) 750 MG tablet Take 1 tablet (750 mg total) by mouth daily. 02/26/16   Okey Regal, PA-C  mirtazapine (REMERON) 45 MG tablet Take 45 mg by mouth at bedtime.  12/03/14   Historical Provider, MD  oxymetazoline (AFRIN NASAL SPRAY) 0.05 % nasal spray Place 1 spray into both nostrils 2 (two) times daily. 04/01/15   Nona Dell, PA-C  predniSONE (STERAPRED UNI-PAK 21 TAB) 10 MG (21) TBPK tablet Take 1 tablet (10 mg total) by mouth daily. Take 6 tabs by mouth daily  for 2 days, then 5 tabs for 2 days, then 4 tabs for 2 days, then 3 tabs for 2 days, 2 tabs for 2 days, then 1 tab by mouth daily for 2 days 03/07/16   Lacretia Leigh, MD  risperiDONE (RISPERDAL) 2 MG tablet Take 1 tablet by mouth at bedtime.  10/15/13   Historical Provider, MD  simvastatin (ZOCOR) 20 MG tablet Take 20 mg by mouth every evening.    Historical Provider, MD  traMADol (ULTRAM) 50 MG tablet Take 1 tablet (50 mg total) by mouth 4 (four) times daily. Patient not taking: Reported on 08/23/2014 04/11/14   Noland Fordyce, PA-C  traZODone (DESYREL) 100 MG tablet Take 100 mg by mouth at bedtime.    Historical Provider, MD  traZODone (DESYREL) 150 MG tablet Take 150 mg by mouth at bedtime.  03/02/16   Historical Provider, MD    Family History Family History  Problem Relation Age of Onset  . Adopted: Yes  . Family history unknown: Yes    Social History Social History  Substance Use Topics  . Smoking  status: Current Every Day Smoker    Packs/day: 1.00    Years: 40.00    Types: Cigarettes  . Smokeless tobacco: Never Used  . Alcohol use No     Allergies   Haloperidol decanoate and Zithromax [azithromycin]   Review of Systems Review of Systems  Constitutional: Negative for fever.  HENT: Positive for congestion.   Respiratory: Positive for cough.   All other systems reviewed and are negative.    Physical Exam Updated Vital Signs BP 119/71 (BP Location: Left Arm)   Pulse 63   Temp 98.6 F (37 C) (Oral)   Resp 20   Ht 5\' 6"  (1.676 m)   Wt 125 lb (56.7 kg)   SpO2 96%   BMI 20.18 kg/m  Physical Exam  Constitutional: She is oriented to person, place, and time.  HENT:  Right Ear: External ear normal.  Left Ear: External ear normal.  Nose: Nose normal.  Mouth/Throat: Oropharynx is clear and moist. No oropharyngeal exudate.  Eyes: Conjunctivae are normal.  Neck: Neck supple.  Cardiovascular: Normal rate, regular rhythm, normal heart sounds and intact distal pulses.   Pulmonary/Chest: Effort normal and breath sounds normal. No respiratory distress. She has no wheezes.  Abdominal: Soft. Bowel sounds are normal. She exhibits no distension. There is no tenderness. There is no rebound and no guarding.  Musculoskeletal: She exhibits no edema.  Lymphadenopathy:    She has no cervical adenopathy.  Neurological: She is alert and oriented to person, place, and time. No cranial nerve deficit.  Skin: Skin is warm and dry.  Psychiatric: She has a normal mood and affect.  Nursing note and vitals reviewed.    ED Treatments / Results  DIAGNOSTIC STUDIES: Oxygen Saturation is 96% on RA, adequate by my interpretation.    COORDINATION OF CARE: 4:25 PM Discussed treatment plan with pt at bedside and pt agreed to plan.    Labs (all labs ordered are listed, but only abnormal results are displayed) Labs Reviewed - No data to display  EKG  EKG Interpretation None        Radiology No results found.  Procedures Procedures (including critical care time)  Medications Ordered in ED Medications - No data to display   Initial Impression / Assessment and Plan / ED Course  I have reviewed the triage vital signs and the nursing notes.  Pertinent labs & imaging results that were available during my care of the patient were reviewed by me and considered in my medical decision making (see chart for details).  Clinical Course    Pt is an 65 y.o. female smoker 1PPD x 40 years, never diagnosed with COPD, here with persistent cough. Diagnosed with pneumonia 02/26/16, treated with course of Levaquin. Repeat CXR on 11/20 with resolution of infiltrate. On exam today she is afebrile, not hypoxic or tachypneic. Her exam is benign, lungs CTAB. She does have intermittent dry cough in the room. Discussed with pt that cough following pneumonia can linger. She also likely has some bronchitis and possible underlying COPD. She states her insurance has now been reinstated so I will give rx for albuterol and promtethazine DM. Referred to [facility] to establish new PCP. ER return precautions given.  Final Clinical Impressions(s) / ED Diagnoses   Final diagnoses:  Bronchitis  Cough    New Prescriptions New Prescriptions   No medications on file   I personally performed the services described in this documentation, which was scribed in my presence. The recorded information has been reviewed and is accurate.    Anne Ng, PA-C 03/23/16 1907    Isla Pence, MD 03/23/16 2027

## 2016-03-23 NOTE — ED Triage Notes (Signed)
Pt reports s&s for greater than 2 months. Pt has been tx with abx, steroids, and inhaler with minimal relief. Pt reports being dx with "spot on lung." Pt currently smokes 1ppd.

## 2016-03-23 NOTE — Discharge Instructions (Signed)
Pick up your inhaler at [facility]. Use as needed. I also gave you a prescription for cough syrup. Take as prescribed. Follow up with [facility] as soon as possible. Return to the ER for new or worsening symptoms.

## 2016-04-27 DIAGNOSIS — F319 Bipolar disorder, unspecified: Secondary | ICD-10-CM | POA: Diagnosis not present

## 2016-05-18 ENCOUNTER — Ambulatory Visit (INDEPENDENT_AMBULATORY_CARE_PROVIDER_SITE_OTHER): Payer: Medicare Other | Admitting: Diagnostic Neuroimaging

## 2016-05-18 ENCOUNTER — Encounter: Payer: Self-pay | Admitting: Diagnostic Neuroimaging

## 2016-05-18 VITALS — BP 115/65 | HR 79 | Wt 131.0 lb

## 2016-05-18 DIAGNOSIS — F329 Major depressive disorder, single episode, unspecified: Secondary | ICD-10-CM

## 2016-05-18 DIAGNOSIS — R569 Unspecified convulsions: Secondary | ICD-10-CM

## 2016-05-18 DIAGNOSIS — F32A Depression, unspecified: Secondary | ICD-10-CM

## 2016-05-18 MED ORDER — LAMOTRIGINE 100 MG PO TABS: ORAL_TABLET | ORAL | 12 refills | Status: DC
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
## 2016-05-19 ENCOUNTER — Telehealth: Payer: Self-pay | Admitting: Diagnostic Neuroimaging

## 2016-05-19 NOTE — Telephone Encounter (Signed)
Patient was referred to [facility] which she called but was told they are not taking new patients. Can she be referred to another office?

## 2016-05-19 NOTE — Telephone Encounter (Addendum)
[facility] and spoke with [facility]. Explained that patient needs to be established for ongoing care due to multiple ED visits recently and the need for general medical follow up. Genovah scheduled patient to be seen at 3 pm on Friday,  06/10/16.   Called patient and gave her appointment information with address to center. She stated she wrote it down, and she repeated date/time correctly. She verbalized understanding, appreciation of call.

## 2016-06-10 ENCOUNTER — Encounter: Payer: Self-pay | Admitting: Family Medicine

## 2016-06-10 ENCOUNTER — Ambulatory Visit: Payer: Medicare Other | Attending: Family Medicine | Admitting: Family Medicine

## 2016-06-10 VITALS — BP 111/71 | HR 70 | Temp 98.3°F | Ht 66.0 in | Wt 129.2 lb

## 2016-06-10 DIAGNOSIS — F17200 Nicotine dependence, unspecified, uncomplicated: Secondary | ICD-10-CM

## 2016-06-10 DIAGNOSIS — Z720 Tobacco use: Secondary | ICD-10-CM | POA: Insufficient documentation

## 2016-06-10 DIAGNOSIS — Z716 Tobacco abuse counseling: Secondary | ICD-10-CM | POA: Insufficient documentation

## 2016-06-10 DIAGNOSIS — R561 Post traumatic seizures: Secondary | ICD-10-CM | POA: Diagnosis present

## 2016-06-10 DIAGNOSIS — J328 Other chronic sinusitis: Secondary | ICD-10-CM

## 2016-06-10 DIAGNOSIS — Z79899 Other long term (current) drug therapy: Secondary | ICD-10-CM | POA: Insufficient documentation

## 2016-06-10 DIAGNOSIS — F331 Major depressive disorder, recurrent, moderate: Secondary | ICD-10-CM | POA: Insufficient documentation

## 2016-06-10 DIAGNOSIS — J329 Chronic sinusitis, unspecified: Secondary | ICD-10-CM | POA: Diagnosis not present

## 2016-06-10 DIAGNOSIS — E782 Mixed hyperlipidemia: Secondary | ICD-10-CM | POA: Diagnosis not present

## 2016-06-10 MED ORDER — SIMVASTATIN 20 MG PO TABS: 20.0000 mg | ORAL_TABLET | Freq: Every evening | ORAL | 5 refills | Status: DC

## 2016-06-10 MED ORDER — FLUTICASONE PROPIONATE 50 MCG/ACT NA SUSP: 2.0000 | Freq: Every day | NASAL | 2 refills | Status: DC

## 2016-06-10 MED ORDER — CETIRIZINE HCL 10 MG PO TABS: 10.0000 mg | ORAL_TABLET | Freq: Every day | ORAL | 2 refills | Status: DC

## 2016-06-10 MED ORDER — VARENICLINE TARTRATE 0.5 MG PO TABS: 0.5000 mg | ORAL_TABLET | Freq: Two times a day (BID) | ORAL | 1 refills | Status: DC

## 2016-06-10 MED ORDER — DEXTROMETHORPHAN HBR 15 MG/5ML PO SYRP: 10.0000 mL | ORAL_SOLUTION | Freq: Four times a day (QID) | ORAL | 0 refills | Status: DC | PRN

## 2016-06-10 MED ORDER — BENZONATATE 100 MG PO CAPS: 100.0000 mg | ORAL_CAPSULE | Freq: Three times a day (TID) | ORAL | 0 refills | Status: DC | PRN

## 2016-06-10 NOTE — Progress Notes (Signed)
Subjective:  Patient ID: [name], female    DOB: 17-Dec-1950  Age: 66 y.o. MRN: [id number]  CC: Nasal Congestion; Cough (productive- green/yellow); Nicotine Dependence (would like to stop); Seizures; and Depression   HPI [name] is a 66 year old female with a history of seizures (managed by neurology), depression (managed by [facility]), hyperlipidemia who comes in today complaining of chronic sinus symptoms for the last 4 months.  States that she had a sinus infection which progressed into pneumonia and was treated 4 months ago however nasal congestion, cough productive of yellowish sputum have persisted. She denies headaches, facial pain or fever. Complains it is difficult to breathe especially at night and this affects her sleep  Last seizure was 3 years ago.  She has been off her statin for the last 1 year as her previous PCP-Dr. Kennon Holter passed and she has not reestablished.  She would like help to quit smoking-currently smokes three-quarter pack of cigarettes per day.  Past Medical History:  Diagnosis Date  . Acid reflux   . Anxiety   . Depression   . Gastric ulcer   . High cholesterol   . Interstitial cystitis   . Mood disorder ([place])   . MRSA (methicillin resistant staph aureus) culture positive   . Pneumonia 2017  . Seizures ([place])     Past Surgical History:  Procedure Laterality Date  . CATARACT EXTRACTION    . MULTIPLE TOOTH EXTRACTIONS    . ROTATOR CUFF REPAIR     R side  . SHOULDER ARTHROSCOPY      Allergies  Allergen Reactions  . Haloperidol Decanoate Anaphylaxis  . Zithromax [Azithromycin] Diarrhea     Outpatient Medications Prior to Visit  Medication Sig Dispense Refill  . ARIPiprazole (ABILIFY) 10 MG tablet Take 10 mg by mouth daily.     . clonazePAM (KLONOPIN) 0.5 MG tablet Take 0.5 mg by mouth at bedtime as needed for anxiety.     Marland Kitchen FLUoxetine (PROZAC) 40 MG capsule Take 40 mg by mouth daily.     Marland Kitchen lamoTRIgine (LAMICTAL) 100 MG tablet Take 1  tablet (100 mg) in the  Morning and Take 2 tablets (200 mg) in evening 90 tablet 12  . mirtazapine (REMERON) 45 MG tablet Take 45 mg by mouth at bedtime.   0  . oxymetazoline (AFRIN NASAL SPRAY) 0.05 % nasal spray Place 1 spray into both nostrils 2 (two) times daily. 30 mL 0  . traZODone (DESYREL) 150 MG tablet Take 150 mg by mouth at bedtime.     . traMADol (ULTRAM) 50 MG tablet Take 1 tablet (50 mg total) by mouth 4 (four) times daily. (Patient not taking: Reported on 08/23/2014) 15 tablet 0  . risperiDONE (RISPERDAL) 2 MG tablet Take 1 tablet by mouth at bedtime.     . simvastatin (ZOCOR) 20 MG tablet Take 20 mg by mouth every evening.     No facility-administered medications prior to visit.     ROS Review of Systems  Constitutional: Negative for activity change, appetite change and fatigue.  HENT: Positive for congestion. Negative for sinus pressure and sore throat.   Eyes: Negative for visual disturbance.  Respiratory: Positive for cough. Negative for chest tightness, shortness of breath and wheezing.   Cardiovascular: Negative for chest pain and palpitations.  Gastrointestinal: Negative for abdominal distention, abdominal pain and constipation.  Endocrine: Negative for polydipsia.  Genitourinary: Negative for dysuria and frequency.  Musculoskeletal: Negative for arthralgias and back pain.  Skin: Negative for rash.  Neurological:  Negative for tremors, light-headedness and numbness.  Hematological: Does not bruise/bleed easily.  Psychiatric/Behavioral: Negative for agitation and behavioral problems.    Objective:  BP 111/71 (BP Location: Right Arm, Patient Position: Sitting, Cuff Size: Large)   Pulse 70   Temp 98.3 F (36.8 C) (Oral)   Ht 5\' 6"  (1.676 m)   Wt 129 lb 3.2 oz (58.6 kg)   SpO2 95%   BMI 20.85 kg/m   BP/Weight 06/10/2016 05/18/2016 [id number]  Systolic BP [id number]  Diastolic BP 71 65 71  Wt. (Lbs) 129.2 131 125  BMI 20.85 21.14 20.18      Physical Exam    Constitutional: She is oriented to person, place, and time. She appears well-developed and well-nourished.  HENT:  Left Ear: External ear normal.  Nasal speech No sinus tenderness, normal oropharynx  Cardiovascular: Normal rate, normal heart sounds and intact distal pulses.   No murmur heard. Pulmonary/Chest: Effort normal and breath sounds normal. She has no wheezes. She has no rales. She exhibits no tenderness.  Abdominal: Soft. Bowel sounds are normal. She exhibits no distension and no mass. There is no tenderness.  Musculoskeletal: Normal range of motion.  Neurological: She is alert and oriented to person, place, and time.     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 HYPERLIPIDEMIA, MIXED We'll hold off on sending lipid panel TODAY activity is likely to be elevated due to being off medications - simvastatin (ZOCOR) 20 MG tablet; Take 1 tablet (20 mg total) by mouth every evening.  Dispense: 30 tablet; Refill: 5  3. Moderate episode of recurrent major depressive disorder (HCC) Currently on Prozac, trazodone, Rameron, Klonopin Keep appointment with [facility]  4. Other chronic sinusitis - fluticasone (FLONASE) 50 MCG/ACT nasal spray; Place 2 sprays into both nostrils daily.  Dispense: 16 g; Refill: 2 - cetirizine (ZYRTEC) 10 MG tablet; Take 1 tablet (10 mg total) by mouth daily.  Dispense: 30 tablet; Refill: 2  5. TOBACCO ABUSE Spent 6 minutes discussing cessation, steps to quit smoking and strategies that can help and the patient is willing to work on quitting. - varenicline (CHANTIX) 0.5 MG tablet; Take 1 tablet (0.5 mg total) by mouth 2 (two) times daily.  Dispense: 60 tablet; Refill: 1   Meds ordered this encounter  Medications  . simvastatin (ZOCOR) 20 MG tablet    Sig: Take 1 tablet (20 mg total) by mouth every evening.    Dispense:  30 tablet    Refill:  5  . fluticasone (FLONASE) 50 MCG/ACT nasal  spray    Sig: Place 2 sprays into both nostrils daily.    Dispense:  16 g    Refill:  2  . cetirizine (ZYRTEC) 10 MG tablet    Sig: Take 1 tablet (10 mg total) by mouth daily.    Dispense:  30 tablet    Refill:  2  . varenicline (CHANTIX) 0.5 MG tablet    Sig: Take 1 tablet (0.5 mg total) by mouth 2 (two) times daily.    Dispense:  60 tablet    Refill:  1    Follow-up: Return in about 3 months (around 09/07/2016) for Follow-up on hyperlipidemia.   Arnoldo Morale MD

## 2016-06-10 NOTE — Patient Instructions (Signed)
Steps to Quit Smoking Smoking tobacco can be bad for your health. It can also affect almost every organ in your body. Smoking puts you and people around you at risk for many serious long-lasting (chronic) diseases. Quitting smoking is hard, but it is one of the best things that you can do for your health. It is never too late to quit. What are the benefits of quitting smoking? When you quit smoking, you lower your risk for getting serious diseases and conditions. They can include:  Lung cancer or lung disease.  Heart disease.  Stroke.  Heart attack.  Not being able to have children (infertility).  Weak bones (osteoporosis) and broken bones (fractures). If you have coughing, wheezing, and shortness of breath, those symptoms may get better when you quit. You may also get sick less often. If you are pregnant, quitting smoking can help to lower your chances of having a baby of low birth weight. What can I do to help me quit smoking? Talk with your doctor about what can help you quit smoking. Some things you can do (strategies) include:  Quitting smoking totally, instead of slowly cutting back how much you smoke over a period of time.  Going to in-person counseling. You are more likely to quit if you go to many counseling sessions.  Using resources and support systems, such as:  Online chats with a counselor.  Phone quitlines.  Printed self-help materials.  Support groups or group counseling.  Text messaging programs.  Mobile phone apps or applications.  Taking medicines. Some of these medicines may have nicotine in them. If you are pregnant or breastfeeding, do not take any medicines to quit smoking unless your doctor says it is okay. Talk with your doctor about counseling or other things that can help you. Talk with your doctor about using more than one strategy at the same time, such as taking medicines while you are also going to in-person counseling. This can help make quitting  easier. What things can I do to make it easier to quit? Quitting smoking might feel very hard at first, but there is a lot that you can do to make it easier. Take these steps:  Talk to your family and friends. Ask them to support and encourage you.  Call phone quitlines, reach out to support groups, or work with a counselor.  Ask people who smoke to not smoke around you.  Avoid places that make you want (trigger) to smoke, such as:  Bars.  Parties.  Smoke-break areas at work.  Spend time with people who do not smoke.  Lower the stress in your life. Stress can make you want to smoke. Try these things to help your stress:  Getting regular exercise.  Deep-breathing exercises.  Yoga.  Meditating.  Doing a body scan. To do this, close your eyes, focus on one area of your body at a time from head to toe, and notice which parts of your body are tense. Try to relax the muscles in those areas.  Download or buy apps on your mobile phone or tablet that can help you stick to your quit plan. There are many free apps, such as QuitGuide from the CDC (Centers for Disease Control and Prevention). You can find more support from smokefree.gov and other websites. This information is not intended to replace advice given to you by your health care provider. Make sure you discuss any questions you have with your health care provider. Document Released: 01/29/2009 Document Revised: 12/01/2015 Document   Reviewed: 08/19/2014 Elsevier Interactive Patient Education  2017 [employer].  

## 2016-07-18 DIAGNOSIS — F319 Bipolar disorder, unspecified: Secondary | ICD-10-CM | POA: Diagnosis not present

## 2016-09-05 DIAGNOSIS — F319 Bipolar disorder, unspecified: Secondary | ICD-10-CM | POA: Diagnosis not present

## 2016-09-07 ENCOUNTER — Ambulatory Visit: Payer: Medicare Other | Admitting: Family Medicine

## 2016-09-28 DIAGNOSIS — F319 Bipolar disorder, unspecified: Secondary | ICD-10-CM | POA: Diagnosis not present

## 2016-10-21 ENCOUNTER — Emergency Department (HOSPITAL_COMMUNITY)
Admission: EM | Admit: 2016-10-21 | Discharge: 2016-10-21 | Disposition: A | Discharge status: Home / Self Care | Payer: Medicare Other | Attending: Emergency Medicine | Admitting: Emergency Medicine

## 2016-10-21 ENCOUNTER — Emergency Department (HOSPITAL_COMMUNITY): Payer: Medicare Other

## 2016-10-21 ENCOUNTER — Encounter (HOSPITAL_COMMUNITY): Payer: Self-pay

## 2016-10-21 DIAGNOSIS — Y9289 Other specified places as the place of occurrence of the external cause: Secondary | ICD-10-CM | POA: Diagnosis not present

## 2016-10-21 DIAGNOSIS — W108XXA Fall (on) (from) other stairs and steps, initial encounter: Secondary | ICD-10-CM | POA: Insufficient documentation

## 2016-10-21 DIAGNOSIS — Z79899 Other long term (current) drug therapy: Secondary | ICD-10-CM | POA: Diagnosis not present

## 2016-10-21 DIAGNOSIS — S92415A Nondisplaced fracture of proximal phalanx of left great toe, initial encounter for closed fracture: Secondary | ICD-10-CM | POA: Insufficient documentation

## 2016-10-21 DIAGNOSIS — S99912A Unspecified injury of left ankle, initial encounter: Secondary | ICD-10-CM | POA: Diagnosis present

## 2016-10-21 DIAGNOSIS — Y939 Activity, unspecified: Secondary | ICD-10-CM | POA: Diagnosis not present

## 2016-10-21 DIAGNOSIS — S92155A Nondisplaced avulsion fracture (chip fracture) of left talus, initial encounter for closed fracture: Secondary | ICD-10-CM | POA: Diagnosis not present

## 2016-10-21 DIAGNOSIS — S92152A Displaced avulsion fracture (chip fracture) of left talus, initial encounter for closed fracture: Secondary | ICD-10-CM | POA: Diagnosis not present

## 2016-10-21 DIAGNOSIS — Y999 Unspecified external cause status: Secondary | ICD-10-CM | POA: Insufficient documentation

## 2016-10-21 DIAGNOSIS — F17210 Nicotine dependence, cigarettes, uncomplicated: Secondary | ICD-10-CM | POA: Diagnosis not present

## 2016-10-21 MED ORDER — HYDROCODONE-ACETAMINOPHEN 5-325 MG PO TABS: 1.0000 | ORAL_TABLET | ORAL | 0 refills | Status: DC | PRN

## 2016-10-21 MED ORDER — HYDROCODONE-ACETAMINOPHEN 5-325 MG PO TABS
1.0000 | ORAL_TABLET | Freq: Once | ORAL | Status: AC
  Administered 2016-10-21: 1 via ORAL
  Filled 2016-10-21: qty 1

## 2016-10-21 NOTE — ED Provider Notes (Signed)
[facility] DEPT Provider Note   CSN: [id number] Arrival date & time: 10/21/16  1647     History   Chief Complaint Chief Complaint  Patient presents with  . Fall  . Ankle Pain    left    HPI [name] is a 66 y.o. female.  HPI  34 old female presents with left foot and ankle pain after a fall. She states that she was walking down her apartment steps and slipped on the last step. She landed on the ground and thinks her left leg was caught up under her right leg. She had trouble walking initially because it was painful but was able to ambulate. Her right leg seemed to be bruised at first but now she states she has no pain in her right lower extremity. She's having pain with bruising over her left great toe and left second toe. Also some left ankle pain. No knee pain bilaterally or hip pain. Did not hit her head or lose consciousness. Took 4 ibuprofen without relief.  Past Medical History:  Diagnosis Date  . Acid reflux   . Anxiety   . Depression   . Gastric ulcer   . High cholesterol   . Interstitial cystitis   . Mood disorder ([place])   . MRSA (methicillin resistant staph aureus) culture positive   . Pneumonia 2017  . Seizures [facility])     Patient Active Problem List   Diagnosis Date Noted  . Chronic sinusitis 06/10/2016  . Headache(784.0) 11/18/2013  . Seizure disorder ([place]) 11/18/2013  . 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 Cocaine abuse 02/09/2013  . Lethargy 02/09/2013  . SEBORRHEIC KERATOSIS 04/03/2007  . EPIDERMOID CYST, BACK 04/03/2007  . BREAST TENDERNESS 02/09/2007  . TOBACCO ABUSE 12/15/2006  . ALLERGIC RHINITIS, SEASONAL 12/15/2006  . GERD 12/15/2006  . DIVERTICULOSIS, COLON 12/15/2006  . [place] DISEASE, LUMBOSACRAL SPINE 12/15/2006  . ALCOHOL ABUSE, HX OF 12/15/2006  . 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 BARRETT'S ESOPHAGUS, HX OF 12/15/2006  . HYPERLIPIDEMIA, MIXED 12/13/2006  . Depression 12/13/2006  . INTERSTITIAL  CYSTITIS 12/13/2006  . 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 CATARACT EXTRACTION    . MULTIPLE TOOTH EXTRACTIONS    . ROTATOR CUFF REPAIR     R side  . SHOULDER ARTHROSCOPY      OB History    No data available       Home Medications    Prior to Admission medications   Medication Sig Start Date End Date Taking? Authorizing Provider  ARIPiprazole (ABILIFY) 15 MG tablet Take 15 mg by mouth daily.  09/28/16  Yes [provider]  clonazePAM (KLONOPIN) 0.5 MG tablet Take 0.5 mg by mouth at bedtime as needed for anxiety.  03/03/16  Yes [provider]  FLUoxetine (PROZAC) 40 MG capsule Take 40 mg by mouth daily.  03/02/16  Yes [provider]  ibuprofen (ADVIL,MOTRIN) 200 MG tablet Take 800 mg by mouth every 8 (eight) hours as needed for moderate pain.   Yes [provider]  lamoTRIgine (LAMICTAL) 100 MG tablet Take 1 tablet (100 mg) in the  Morning and Take 2 tablets (200 mg) in evening 05/18/16  Yes Penumalli, Vikram R, MD  mirtazapine (REMERON) 45 MG tablet Take 45 mg by mouth at bedtime.  12/03/14  Yes [provider]  simvastatin (ZOCOR) 20 MG tablet Take 1 tablet (20 mg total) by mouth every evening. 06/10/16  Yes Arnoldo Morale, MD  traZODone (DESYREL) 150 MG tablet Take 150 mg by mouth at bedtime.  03/02/16  Yes [provider]  benzonatate (TESSALON PERLES) 100 MG capsule Take 1 capsule (100 mg total) by mouth 3 (three) times daily as needed for cough. Patient not taking: Reported on 10/21/2016 06/10/16   Arnoldo Morale, MD  cetirizine (ZYRTEC) 10 MG tablet Take 1 tablet (10 mg total) by mouth daily. Patient not taking: Reported on 10/21/2016 06/10/16   Arnoldo Morale, MD  dextromethorphan 15 MG/5ML syrup Take 10 mLs (30 mg total) by mouth 4 (four) times daily as needed for cough. Patient not taking: Reported on 10/21/2016 06/10/16   Arnoldo Morale, MD  fluticasone (FLONASE) 50  MCG/ACT nasal spray Place 2 sprays into both nostrils daily. Patient not taking: Reported on 10/21/2016 06/10/16   Arnoldo Morale, MD  HYDROcodone-acetaminophen (NORCO) 5-325 MG tablet Take 1-2 tablets by mouth every 4 (four) hours as needed. 10/21/16   Sherwood Gambler, MD  oxymetazoline (AFRIN NASAL SPRAY) 0.05 % nasal spray Place 1 spray into both nostrils 2 (two) times daily. Patient not taking: Reported on 10/21/2016 04/01/15   Nona Dell, PA-C  traMADol (ULTRAM) 50 MG tablet Take 1 tablet (50 mg total) by mouth 4 (four) times daily. Patient not taking: Reported on 08/23/2014 04/11/14   Noe Gens, PA-C  varenicline (CHANTIX) 0.5 MG tablet Take 1 tablet (0.5 mg total) by mouth 2 (two) times daily. Patient not taking: Reported on 10/21/2016 06/10/16   Arnoldo Morale, MD    Family History Family History  Problem Relation Age of Onset  . Adopted: Yes  . Family history unknown: Yes    Social History Social History  Substance Use Topics  . Smoking status: Current Every Day Smoker    Packs/day: 1.00    Years: 40.00    Types: Cigarettes  . Smokeless tobacco: Never Used  . Alcohol use No     Allergies   Haloperidol decanoate and Zithromax [azithromycin]   Review of Systems Review of Systems  Musculoskeletal: Positive for arthralgias and joint swelling.  Skin: Positive for color change. Negative for wound.  Neurological: Negative for weakness and numbness.  All other systems reviewed and are negative.    Physical Exam Updated Vital Signs BP 125/71 (BP Location: Right Arm)   Pulse 60   Temp 97.7 F (36.5 C) (Oral)   Resp 18   Ht 5\' 6"  (1.676 m)   Wt 59 kg (130 lb)   SpO2 95%   BMI 20.98 kg/m   Physical Exam  Constitutional: She is oriented to person, place, and time. She appears well-developed and well-nourished.  HENT:  Head: Normocephalic and atraumatic.  Right Ear: External ear normal.  Left Ear: External ear normal.  Nose: Nose normal.  Eyes: Right eye  exhibits no discharge. Left eye exhibits no discharge.  Cardiovascular: Normal rate, regular rhythm and intact distal pulses.   Pulmonary/Chest: Effort normal.  Musculoskeletal:       Right hip: She exhibits normal range of motion and no tenderness.       Left hip: She exhibits normal range of motion and no tenderness.       Right knee: She exhibits normal range of motion. No tenderness found.       Left knee: She exhibits normal range of motion. No tenderness found.       Right ankle: No tenderness.       Left ankle: She exhibits normal range of motion and  no swelling. Tenderness (mild).       Right lower leg: She exhibits no tenderness.       Left lower leg: She exhibits no tenderness.       Right foot: There is normal range of motion and no tenderness.       Left foot: There is tenderness and swelling.       Feet:  Neurological: She is alert and oriented to person, place, and time.  Skin: Skin is warm and dry.  Nursing note and vitals reviewed.    ED Treatments / Results  Labs (all labs ordered are listed, but only abnormal results are displayed) Labs Reviewed - No data to display  EKG  EKG Interpretation None       Radiology Dg Ankle Complete Left  Result Date: 10/21/2016 CLINICAL DATA:  Fall, missed a step going downstairs, now with left foot and ankle pain. EXAM: LEFT ANKLE COMPLETE - 3+ VIEW COMPARISON:  Radiographs 02/06/2012 FINDINGS: Small dorsal avulsion fracture from the anterior talus. Associated anterior soft tissue edema and minimal joint effusion. No additional acute fracture. The ankle mortise is preserved. Tiny plantar calcaneal spur. Minimal tibial talar spurring consistent with osteoarthritis. IMPRESSION: Small dorsal avulsion fracture from the anterior talus with associated soft tissue edema. No additional acute fracture of the ankle. Electronically Signed   By: Jeb Levering M.D.   On: 10/21/2016 19:29   Dg Foot Complete Left  Result Date:  10/21/2016 CLINICAL DATA:  Fall, missed a step going downstairs, now with left foot and ankle pain. EXAM: LEFT FOOT - COMPLETE 3+ VIEW COMPARISON:  Radiographs 02/06/2012 FINDINGS: Small avulsion fracture from the dorsal anterior navicular with associated soft tissue edema. Intra-articular fracture of the great toe distal phalanx extending to the interphalangeal joint, nondisplaced. There is bony fusion of the fourth toe proximal and middle phalanx since prior exam, fifth toe proximal phalanx is truncated. Minimal scattered osteoarthritis throughout the foot. IMPRESSION: 1. Small joint fracture from the dorsal talus. 2. Nondisplaced great toe proximal phalanx fracture extending to the interphalangeal joint. 3. Changes of the fourth and fifth toes may be postsurgical or sequela of remote inflammatory arthropathy. Electronically Signed   By: Jeb Levering M.D.   On: 10/21/2016 19:32    Procedures Procedures (including critical care time)  Medications Ordered in ED Medications  HYDROcodone-acetaminophen (NORCO/VICODIN) 5-325 MG per tablet 1 tablet (1 tablet Oral Given 10/21/16 1925)     Initial Impression / Assessment and Plan / ED Course  I have reviewed the triage vital signs and the nursing notes.  Pertinent labs & imaging results that were available during my care of the patient were reviewed by me and considered in my medical decision making (see chart for details).     Patient is neurovascular intact. Warm, well-perfused lower extremities. X-ray shows small avulsion fracture of talus as well as nondisplaced great toe fracture. No other injuries noted. No significant swelling. Place in cam walker, norco for pain, f/u with ortho. Discussed return precautions.  Final Clinical Impressions(s) / ED Diagnoses   Final diagnoses:  Closed nondisplaced fracture of proximal phalanx of left great toe, initial encounter  Closed nondisplaced avulsion fracture of left talus, initial encounter    New  Prescriptions New Prescriptions   HYDROCODONE-ACETAMINOPHEN (NORCO) 5-325 MG TABLET    Take 1-2 tablets by mouth every 4 (four) hours as needed.     Sherwood Gambler, MD 10/21/16 2039

## 2016-10-21 NOTE — ED Triage Notes (Signed)
Patient came in after missing a step when walking down stairs and hurt her left ankle/toe. C/o pain and swelling.

## 2016-11-14 ENCOUNTER — Encounter (HOSPITAL_COMMUNITY): Payer: Self-pay | Admitting: Emergency Medicine

## 2016-11-14 ENCOUNTER — Emergency Department (HOSPITAL_COMMUNITY)
Admission: EM | Admit: 2016-11-14 | Discharge: 2016-11-14 | Disposition: A | Discharge status: Home / Self Care | Payer: Medicare Other | Attending: Emergency Medicine | Admitting: Emergency Medicine

## 2016-11-14 DIAGNOSIS — M533 Sacrococcygeal disorders, not elsewhere classified: Secondary | ICD-10-CM | POA: Diagnosis not present

## 2016-11-14 DIAGNOSIS — F17210 Nicotine dependence, cigarettes, uncomplicated: Secondary | ICD-10-CM | POA: Insufficient documentation

## 2016-11-14 DIAGNOSIS — Z79899 Other long term (current) drug therapy: Secondary | ICD-10-CM | POA: Insufficient documentation

## 2016-11-14 DIAGNOSIS — M545 Low back pain, unspecified: Secondary | ICD-10-CM

## 2016-11-14 MED ORDER — CYCLOBENZAPRINE HCL 10 MG PO TABS
5.0000 mg | ORAL_TABLET | Freq: Once | ORAL | Status: AC
  Administered 2016-11-14: 5 mg via ORAL
  Filled 2016-11-14: qty 1

## 2016-11-14 MED ORDER — CYCLOBENZAPRINE HCL 10 MG PO TABS: 10.0000 mg | ORAL_TABLET | Freq: Two times a day (BID) | ORAL | 0 refills | Status: DC | PRN

## 2016-11-14 MED ORDER — IBUPROFEN 600 MG PO TABS: 600.0000 mg | ORAL_TABLET | Freq: Four times a day (QID) | ORAL | 0 refills | Status: DC | PRN

## 2016-11-14 MED ORDER — PREDNISONE 20 MG PO TABS
60.0000 mg | ORAL_TABLET | Freq: Once | ORAL | Status: AC
  Administered 2016-11-14: 60 mg via ORAL
  Filled 2016-11-14: qty 3

## 2016-11-14 NOTE — ED Triage Notes (Signed)
Pt reports she began to have R lower back pain 2 days ago that has gradually gotten worse. No known injury. 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 This pain feel similar.

## 2016-11-14 NOTE — ED Provider Notes (Signed)
[facility] DEPT Provider Note   CSN: [id number] Arrival date & time: 11/14/16  1839     History   Chief Complaint Chief Complaint  Patient presents with  . Back Pain    HPI [name] is a 66 y.o. female.  HPI   66 year old female with history of alcohol abuse, cocaine use, seizure disorder, depression, GERD, degenerative disc disorder presenting complaining of low back pain. Patient states for the past 2 weeks she has had progressive worsening right low back pain. Pain is sharp, nonradiating, worsening with ambulation and mildly improved with taking Advil. Pain feels similar to degenerative disc disease affecting her left low back in the past. Pain does not radiate down her legs. No social numbness. No abdominal pain. No bowel bladder incontinence or saddle anesthesia. Denies any recent injury. No history of IV drug use or active cancer.  Past Medical History:  Diagnosis Date  . Acid reflux   . Anxiety   . Depression   . Gastric ulcer   . High cholesterol   . Interstitial cystitis   . Mood disorder ([place])   . MRSA (methicillin resistant staph aureus) culture positive   . Pneumonia 2017  . Seizures [facility])     Patient Active Problem List   Diagnosis Date Noted  . Chronic sinusitis 06/10/2016  . Headache(784.0) 11/18/2013  . Seizure disorder ([place]) 11/18/2013  . 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 Cocaine abuse 02/09/2013  . Lethargy 02/09/2013  . SEBORRHEIC KERATOSIS 04/03/2007  . EPIDERMOID CYST, BACK 04/03/2007  . BREAST TENDERNESS 02/09/2007  . TOBACCO ABUSE 12/15/2006  . ALLERGIC RHINITIS, SEASONAL 12/15/2006  . GERD 12/15/2006  . DIVERTICULOSIS, COLON 12/15/2006  . [place] DISEASE, LUMBOSACRAL SPINE 12/15/2006  . ALCOHOL ABUSE, HX OF 12/15/2006  . 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 BARRETT'S ESOPHAGUS, HX OF 12/15/2006  . HYPERLIPIDEMIA, MIXED 12/13/2006  . Depression 12/13/2006  . INTERSTITIAL CYSTITIS 12/13/2006  .  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 CATARACT EXTRACTION    . MULTIPLE TOOTH EXTRACTIONS    . ROTATOR CUFF REPAIR     R side  . SHOULDER ARTHROSCOPY      OB History    No data available       Home Medications    Prior to Admission medications   Medication Sig Start Date End Date Taking? Authorizing Provider  ARIPiprazole (ABILIFY) 15 MG tablet Take 15 mg by mouth daily.  09/28/16   [provider]  benzonatate (TESSALON PERLES) 100 MG capsule Take 1 capsule (100 mg total) by mouth 3 (three) times daily as needed for cough. Patient not taking: Reported on 10/21/2016 06/10/16   Arnoldo Morale, MD  cetirizine (ZYRTEC) 10 MG tablet Take 1 tablet (10 mg total) by mouth daily. Patient not taking: Reported on 10/21/2016 06/10/16   Arnoldo Morale, MD  clonazePAM (KLONOPIN) 0.5 MG tablet Take 0.5 mg by mouth at bedtime as needed for anxiety.  03/03/16   [provider]  dextromethorphan 15 MG/5ML syrup Take 10 mLs (30 mg total) by mouth 4 (four) times daily as needed for cough. Patient not taking: Reported on 10/21/2016 06/10/16   Arnoldo Morale, MD  FLUoxetine (PROZAC) 40 MG capsule Take 40 mg by mouth daily.  03/02/16   [provider]  fluticasone (FLONASE) 50 MCG/ACT nasal spray Place 2 sprays into both nostrils daily. Patient not taking: Reported on 10/21/2016 06/10/16   Arnoldo Morale, MD  HYDROcodone-acetaminophen (NORCO) 5-325 MG tablet Take 1-2 tablets by mouth every 4 (four) hours as needed. 10/21/16   Sherwood Gambler, MD  ibuprofen (ADVIL,MOTRIN) 200 MG tablet Take 800 mg by mouth every 8 (eight) hours as needed for moderate pain.    [provider]  lamoTRIgine (LAMICTAL) 100 MG tablet Take 1 tablet (100 mg) in the  Morning and Take 2 tablets (200 mg) in evening 05/18/16   Penumalli, Earlean Polka, MD  mirtazapine (REMERON) 45 MG tablet Take 45 mg by mouth at bedtime.  12/03/14   [provider]    oxymetazoline (AFRIN NASAL SPRAY) 0.05 % nasal spray Place 1 spray into both nostrils 2 (two) times daily. Patient not taking: Reported on 10/21/2016 04/01/15   Nona Dell, PA-C  simvastatin (ZOCOR) 20 MG tablet Take 1 tablet (20 mg total) by mouth every evening. 06/10/16   Arnoldo Morale, MD  traMADol (ULTRAM) 50 MG tablet Take 1 tablet (50 mg total) by mouth 4 (four) times daily. Patient not taking: Reported on 08/23/2014 04/11/14   Noe Gens, PA-C  traZODone (DESYREL) 150 MG tablet Take 150 mg by mouth at bedtime.  03/02/16   [provider]  varenicline (CHANTIX) 0.5 MG tablet Take 1 tablet (0.5 mg total) by mouth 2 (two) times daily. Patient not taking: Reported on 10/21/2016 06/10/16   Arnoldo Morale, MD    Family History Family History  Problem Relation Age of Onset  . Adopted: Yes  . Family history unknown: Yes    Social History Social History  Substance Use Topics  . Smoking status: Current Every Day Smoker    Packs/day: 1.00    Years: 40.00    Types: Cigarettes  . Smokeless tobacco: Never Used  . Alcohol use No     Allergies   Haloperidol decanoate and Zithromax [azithromycin]   Review of Systems Review of Systems  Constitutional: Negative for fever.  Musculoskeletal: Positive for back pain.  Neurological: Negative for numbness.     Physical Exam Updated Vital Signs BP 109/82   Pulse 65   Temp 98.5 F (36.9 C) (Oral)   Resp 16   SpO2 97%   Physical Exam  Constitutional: She appears well-developed and well-nourished. No distress.  HENT:  Head: Atraumatic.  Eyes: Conjunctivae are normal.  Neck: Neck supple.  Cardiovascular: Intact distal pulses.   Musculoskeletal: She exhibits tenderness (Tenderness to right lumbosacral region on palpation with normal hip flexion extension abduction and abduction. No overlying skin changes.). She exhibits no edema.  No significant midline spine tenderness crepitus or step-off  5 out 5 strength to  bilateral lower extremities with intact distal pedal pulse  Neurological: She is alert.  Skin: No rash noted.  Psychiatric: She has a normal mood and affect.  Nursing note and vitals reviewed.    ED Treatments / Results  Labs (all labs ordered are listed, but only abnormal results are displayed) Labs Reviewed - No data to display  EKG  EKG Interpretation None       Radiology No results found.  Procedures Procedures (including critical care time)  Medications Ordered in ED Medications - No data to display   Initial Impression / Assessment and Plan / ED Course  I have reviewed the triage vital signs and the nursing notes.  Pertinent labs & imaging results that were available during my care of the patient were reviewed by me and considered in my medical decision making (see chart for details).     BP 109/82  Pulse 65   Temp 98.5 F (36.9 C) (Oral)   Resp 16   SpO2 97%    Final Clinical Impressions(s) / ED Diagnoses   Final diagnoses:  Lumbosacral pain    New Prescriptions New Prescriptions   CYCLOBENZAPRINE (FLEXERIL) 10 MG TABLET    Take 1 tablet (10 mg total) by mouth 2 (two) times daily as needed for muscle spasms.   IBUPROFEN (ADVIL,MOTRIN) 600 MG TABLET    Take 1 tablet (600 mg total) by mouth every 6 (six) hours as needed.   9:14 PM Patient here with atraumatic right lumbosacral pain. History of DJD. No red flags. Able to ambulate. She is neurovascularly intact. Will prior symptomatic treatment and orthopedic referral given as needed.   Domenic Moras, PA-C 11/14/16 2117    Daleen Bo, MD 11/15/16 0001

## 2016-11-15 ENCOUNTER — Ambulatory Visit: Payer: Medicare Other | Admitting: Diagnostic Neuroimaging

## 2016-11-30 ENCOUNTER — Encounter: Payer: Self-pay | Admitting: Family Medicine

## 2016-11-30 ENCOUNTER — Ambulatory Visit: Payer: Medicare Other | Attending: Family Medicine | Admitting: Family Medicine

## 2016-11-30 VITALS — BP 113/67 | HR 70 | Temp 97.4°F | Ht 66.0 in | Wt 134.6 lb

## 2016-11-30 DIAGNOSIS — Z9849 Cataract extraction status, unspecified eye: Secondary | ICD-10-CM | POA: Diagnosis not present

## 2016-11-30 DIAGNOSIS — Z9119 Patient's noncompliance with other medical treatment and regimen: Secondary | ICD-10-CM | POA: Insufficient documentation

## 2016-11-30 DIAGNOSIS — F419 Anxiety disorder, unspecified: Secondary | ICD-10-CM | POA: Insufficient documentation

## 2016-11-30 DIAGNOSIS — J329 Chronic sinusitis, unspecified: Secondary | ICD-10-CM | POA: Insufficient documentation

## 2016-11-30 DIAGNOSIS — Z9889 Other specified postprocedural states: Secondary | ICD-10-CM | POA: Diagnosis not present

## 2016-11-30 DIAGNOSIS — F329 Major depressive disorder, single episode, unspecified: Secondary | ICD-10-CM | POA: Diagnosis not present

## 2016-11-30 DIAGNOSIS — Z881 Allergy status to other antibiotic agents status: Secondary | ICD-10-CM | POA: Diagnosis not present

## 2016-11-30 DIAGNOSIS — M51379 Other intervertebral disc degeneration, lumbosacral region without mention of lumbar back pain or lower extremity pain: Secondary | ICD-10-CM

## 2016-11-30 DIAGNOSIS — M5137 Other intervertebral disc degeneration, lumbosacral region: Secondary | ICD-10-CM | POA: Diagnosis not present

## 2016-11-30 DIAGNOSIS — Z8719 Personal history of other diseases of the digestive system: Secondary | ICD-10-CM | POA: Diagnosis not present

## 2016-11-30 DIAGNOSIS — Z888 Allergy status to other drugs, medicaments and biological substances status: Secondary | ICD-10-CM | POA: Diagnosis not present

## 2016-11-30 DIAGNOSIS — R569 Unspecified convulsions: Secondary | ICD-10-CM | POA: Insufficient documentation

## 2016-11-30 DIAGNOSIS — E7800 Pure hypercholesterolemia, unspecified: Secondary | ICD-10-CM | POA: Diagnosis not present

## 2016-11-30 DIAGNOSIS — K219 Gastro-esophageal reflux disease without esophagitis: Secondary | ICD-10-CM | POA: Diagnosis not present

## 2016-11-30 DIAGNOSIS — Z9114 Patient's other noncompliance with medication regimen: Secondary | ICD-10-CM | POA: Diagnosis not present

## 2016-11-30 DIAGNOSIS — J328 Other chronic sinusitis: Secondary | ICD-10-CM

## 2016-11-30 DIAGNOSIS — Z8614 Personal history of Methicillin resistant Staphylococcus aureus infection: Secondary | ICD-10-CM | POA: Insufficient documentation

## 2016-11-30 DIAGNOSIS — M549 Dorsalgia, unspecified: Secondary | ICD-10-CM | POA: Diagnosis present

## 2016-11-30 MED ORDER — IBUPROFEN 600 MG PO TABS: 600.0000 mg | ORAL_TABLET | Freq: Three times a day (TID) | ORAL | 1 refills | Status: DC | PRN

## 2016-11-30 MED ORDER — FLUTICASONE PROPIONATE 50 MCG/ACT NA SUSP: 2.0000 | Freq: Every day | NASAL | 2 refills | Status: DC

## 2016-11-30 MED ORDER — CETIRIZINE HCL 10 MG PO TABS: 10.0000 mg | ORAL_TABLET | Freq: Every day | ORAL | 2 refills | Status: DC

## 2016-11-30 MED ORDER — CYCLOBENZAPRINE HCL 10 MG PO TABS: 10.0000 mg | ORAL_TABLET | Freq: Two times a day (BID) | ORAL | 1 refills | Status: DC | PRN

## 2016-11-30 NOTE — Progress Notes (Signed)
Subjective:  Patient ID: [name], female    DOB: 08/14/1950  Age: 66 y.o. MRN: [id number]  CC: Back Pain   HPI Lashawn Bromwell is a 66 year old female with a history of seizures (managed by neurology), depression (managed by [facility]), hyperlipidemia who comes in today complaining of Low back pain which does not radiate down her lower extremities.  She gives a history of degenerative disc disease of the lumbar spine for which she takes ibuprofen and Flexeril but recently ran out of her Flexeril. Denies numbness in extremities, falls or loss of sphincteric function.  Lumbar spine x-ray from 03/2014 revealed degenerative changes.  She was treated for chronic sinusitis. Last visit with Flonase and Zyrtec which she has not been compliant with but complains of continuous postnasal drip and cough which is sometimes productive of whitish sputum.  Past Medical History:  Diagnosis Date  . Acid reflux   . Anxiety   . Depression   . Gastric ulcer   . High cholesterol   . Interstitial cystitis   . Mood disorder ([place])   . MRSA (methicillin resistant staph aureus) culture positive   . Pneumonia 2017  . Seizures ([place])     Past Surgical History:  Procedure Laterality Date  . CATARACT EXTRACTION    . MULTIPLE TOOTH EXTRACTIONS    . ROTATOR CUFF REPAIR     R side  . SHOULDER ARTHROSCOPY      Allergies  Allergen Reactions  . Haloperidol Decanoate Anaphylaxis  . Zithromax [Azithromycin] Diarrhea     Outpatient Medications Prior to Visit  Medication Sig Dispense Refill  . ARIPiprazole (ABILIFY) 15 MG tablet Take 15 mg by mouth daily.     . clonazePAM (KLONOPIN) 0.5 MG tablet Take 0.5 mg by mouth at bedtime as needed for anxiety.     Marland Kitchen FLUoxetine (PROZAC) 40 MG capsule Take 40 mg by mouth daily.     Marland Kitchen lamoTRIgine (LAMICTAL) 100 MG tablet Take 1 tablet (100 mg) in the  Morning and Take 2 tablets (200 mg) in evening 90 tablet 12  . mirtazapine (REMERON) 45 MG tablet Take 45 mg by mouth  at bedtime.   0  . simvastatin (ZOCOR) 20 MG tablet Take 1 tablet (20 mg total) by mouth every evening. 30 tablet 5  . traZODone (DESYREL) 150 MG tablet Take 150 mg by mouth at bedtime.     Marland Kitchen ibuprofen (ADVIL,MOTRIN) 600 MG tablet Take 1 tablet (600 mg total) by mouth every 6 (six) hours as needed. 30 tablet 0  . benzonatate (TESSALON PERLES) 100 MG capsule Take 1 capsule (100 mg total) by mouth 3 (three) times daily as needed for cough. (Patient not taking: Reported on 10/21/2016) 21 capsule 0  . dextromethorphan 15 MG/5ML syrup Take 10 mLs (30 mg total) by mouth 4 (four) times daily as needed for cough. (Patient not taking: Reported on 10/21/2016) 120 mL 0  . oxymetazoline (AFRIN NASAL SPRAY) 0.05 % nasal spray Place 1 spray into both nostrils 2 (two) times daily. (Patient not taking: Reported on 10/21/2016) 30 mL 0  . varenicline (CHANTIX) 0.5 MG tablet Take 1 tablet (0.5 mg total) by mouth 2 (two) times daily. (Patient not taking: Reported on 10/21/2016) 60 tablet 1  . cetirizine (ZYRTEC) 10 MG tablet Take 1 tablet (10 mg total) by mouth daily. (Patient not taking: Reported on 10/21/2016) 30 tablet 2  . cyclobenzaprine (FLEXERIL) 10 MG tablet Take 1 tablet (10 mg total) by mouth 2 (two) times daily as needed  for muscle spasms. (Patient not taking: Reported on 11/30/2016) 20 tablet 0  . fluticasone (FLONASE) 50 MCG/ACT nasal spray Place 2 sprays into both nostrils daily. (Patient not taking: Reported on 10/21/2016) 16 g 2  . HYDROcodone-acetaminophen (NORCO) 5-325 MG tablet Take 1-2 tablets by mouth every 4 (four) hours as needed. (Patient not taking: Reported on 11/30/2016) 10 tablet 0  . traMADol (ULTRAM) 50 MG tablet Take 1 tablet (50 mg total) by mouth 4 (four) times daily. (Patient not taking: Reported on 08/23/2014) 15 tablet 0   No facility-administered medications prior to visit.     ROS Review of Systems  Constitutional: Negative for activity change, appetite change and fatigue.  HENT: Positive for  postnasal drip. Negative for congestion, sinus pain, sinus pressure and sore throat.   Eyes: Negative for visual disturbance.  Respiratory: Negative for cough, chest tightness, shortness of breath and wheezing.   Cardiovascular: Negative for chest pain and palpitations.  Gastrointestinal: Negative for abdominal distention, abdominal pain and constipation.  Endocrine: Negative for polydipsia.  Genitourinary: Negative for dysuria and frequency.  Musculoskeletal: Positive for back pain. Negative for arthralgias.  Skin: Negative for rash.  Neurological: Negative for tremors, light-headedness and numbness.  Hematological: Does not bruise/bleed easily.  Psychiatric/Behavioral: Negative for agitation and behavioral problems.    Objective:  BP 113/67   Pulse 70   Temp (!) 97.4 F (36.3 C) (Oral)   Ht 5\' 6"  (1.676 m)   Wt 134 lb 9.6 oz (61.1 kg)   SpO2 98%   BMI 21.73 kg/m   BP/Weight 11/30/2016 08/09/5359 07/20/3152  Systolic BP 008 676 195  Diastolic BP 67 65 71  Wt. (Lbs) 134.6 - 130  BMI 21.73 - 20.98      Physical Exam  Constitutional: She is oriented to person, place, and time. She appears well-developed and well-nourished.  HENT:  Right Ear: External ear normal.  Left Ear: External ear normal.  Mouth/Throat: Oropharynx is clear and moist. No oropharyngeal exudate.  Eyes: Right eye exhibits no discharge. Left eye exhibits no discharge.  Cardiovascular: Normal rate, normal heart sounds and intact distal pulses.   No murmur heard. Pulmonary/Chest: Effort normal and breath sounds normal. She has no wheezes. She has no rales. She exhibits no tenderness.  Abdominal: Soft. Bowel sounds are normal. She exhibits no distension and no mass. There is no tenderness.  Musculoskeletal: Normal range of motion. She exhibits tenderness (slight tenderness on palpation of right side of lumbar spine; negative straight leg raise).  Neurological: She is alert and oriented to person, place, and time.      Assessment & Plan:   1. DEGENERATIVE DISC DISEASE, LUMBOSACRAL SPINE Uncontrolled Advised to apply heat to her and refilled medications - ibuprofen (ADVIL,MOTRIN) 600 MG tablet; Take 1 tablet (600 mg total) by mouth every 8 (eight) hours as needed.  Dispense: 60 tablet; Refill: 1 - cyclobenzaprine (FLEXERIL) 10 MG tablet; Take 1 tablet (10 mg total) by mouth 2 (two) times daily as needed for muscle spasms.  Dispense: 60 tablet; Refill: 1  2. Other chronic sinusitis History of previous sinus surgery Uncontrolled due to noncompliance with Flonase and Zyrtec; encouraged to be more compliant We'll consider CT sinuses Visit if symptoms of uncontrolled - fluticasone (FLONASE) 50 MCG/ACT nasal spray; Place 2 sprays into both nostrils daily.  Dispense: 16 g; Refill: 2 - cetirizine (ZYRTEC) 10 MG tablet; Take 1 tablet (10 mg total) by mouth daily.  Dispense: 30 tablet; Refill: 2   Meds ordered this encounter  Medications  . fluticasone (FLONASE) 50 MCG/ACT nasal spray    Sig: Place 2 sprays into both nostrils daily.    Dispense:  16 g    Refill:  2  . ibuprofen (ADVIL,MOTRIN) 600 MG tablet    Sig: Take 1 tablet (600 mg total) by mouth every 8 (eight) hours as needed.    Dispense:  60 tablet    Refill:  1  . cyclobenzaprine (FLEXERIL) 10 MG tablet    Sig: Take 1 tablet (10 mg total) by mouth 2 (two) times daily as needed for muscle spasms.    Dispense:  60 tablet    Refill:  1  . cetirizine (ZYRTEC) 10 MG tablet    Sig: Take 1 tablet (10 mg total) by mouth daily.    Dispense:  30 tablet    Refill:  2    Follow-up: Return in about 3 weeks (around 12/21/2016) for Follow-up of chronic medical conditions.   This note has been created with Surveyor, quantity. Any transcriptional errors are unintentional.     Arnoldo Morale MD

## 2016-12-06 ENCOUNTER — Telehealth: Payer: Self-pay | Admitting: Family Medicine

## 2016-12-06 DIAGNOSIS — F17200 Nicotine dependence, unspecified, uncomplicated: Secondary | ICD-10-CM

## 2016-12-06 NOTE — Telephone Encounter (Signed)
Will route to PCP 

## 2016-12-06 NOTE — Telephone Encounter (Signed)
Pt called to request a refill for varenicline (CHANTIX) 0.5 MG tablet  benzonatate (TESSALON PERLES) 100 MG capsule  Please if you can auth follow up with pt

## 2016-12-07 ENCOUNTER — Telehealth: Payer: Self-pay | Admitting: Family Medicine

## 2016-12-07 NOTE — Telephone Encounter (Signed)
Patient called requesting status of medication refill on varenicline (CHANTIX) 0.5 MG tablet  benzonatate (TESSALON PERLES) 100 MG capsule, pt states cough has been really bad ad its hard to get any sleep. Please f/up

## 2016-12-08 ENCOUNTER — Telehealth: Payer: Self-pay

## 2016-12-08 MED ORDER — BENZONATATE 100 MG PO CAPS: 100.0000 mg | ORAL_CAPSULE | Freq: Three times a day (TID) | ORAL | 0 refills | Status: DC | PRN

## 2016-12-08 MED ORDER — VARENICLINE TARTRATE 0.5 MG PO TABS: 0.5000 mg | ORAL_TABLET | Freq: Two times a day (BID) | ORAL | 1 refills | Status: DC

## 2016-12-08 NOTE — Telephone Encounter (Signed)
Refilled

## 2016-12-08 NOTE — Telephone Encounter (Signed)
Addressed.

## 2016-12-08 NOTE — Telephone Encounter (Signed)
Pt was called and a VM was left informing pt of refills.

## 2016-12-08 NOTE — Addendum Note (Signed)
Addended by: Arnoldo Morale on: 12/08/2016 08:17 AM   Modules accepted: Orders

## 2016-12-16 ENCOUNTER — Other Ambulatory Visit: Payer: Self-pay | Admitting: Pharmacist

## 2016-12-16 DIAGNOSIS — E782 Mixed hyperlipidemia: Secondary | ICD-10-CM

## 2016-12-16 MED ORDER — SIMVASTATIN 20 MG PO TABS: 20.0000 mg | ORAL_TABLET | Freq: Every evening | ORAL | 2 refills | Status: DC

## 2017-01-03 ENCOUNTER — Ambulatory Visit: Payer: Medicare Other | Admitting: Family Medicine

## 2017-01-05 DIAGNOSIS — F319 Bipolar disorder, unspecified: Secondary | ICD-10-CM | POA: Diagnosis not present

## 2017-01-11 ENCOUNTER — Emergency Department (HOSPITAL_COMMUNITY)
Admission: EM | Admit: 2017-01-11 | Discharge: 2017-01-11 | Discharge status: Against Medical Advice | Payer: Medicare Other

## 2017-01-11 ENCOUNTER — Ambulatory Visit: Payer: Medicare Other | Admitting: Family Medicine

## 2017-01-11 NOTE — ED Notes (Signed)
Pt called from lobby with no response x2

## 2017-01-11 NOTE — ED Notes (Signed)
Pt called from lobby with no response. 

## 2017-02-01 DIAGNOSIS — F319 Bipolar disorder, unspecified: Secondary | ICD-10-CM | POA: Diagnosis not present

## 2017-02-01 DIAGNOSIS — Z79899 Other long term (current) drug therapy: Secondary | ICD-10-CM | POA: Diagnosis not present

## 2017-02-01 DIAGNOSIS — F411 Generalized anxiety disorder: Secondary | ICD-10-CM | POA: Diagnosis not present

## 2017-02-17 ENCOUNTER — Telehealth: Payer: Self-pay | Admitting: Diagnostic Neuroimaging

## 2017-02-17 NOTE — Telephone Encounter (Signed)
Pt states she has had a head aches for the last 4 days and it is even affecting her sleep.  Pt is asking that something be called in for her to take .  The pain is on the left side of her head.  Pt uses  [facility], [place] [phone] (Phone) [phone] (Fax)

## 2017-02-20 NOTE — Telephone Encounter (Signed)
Noted  

## 2017-02-20 NOTE — Telephone Encounter (Addendum)
Called and spoke with pt. She states that her headache is better and she is taking Ibuprofen. However, she said she is taking 800 mg every 4 hours. I advised her that the daily limit of Ibuprofen is 2,400 mg in 24 hours and that she should be taking less at one time and not as often. She verbalized understanding. I also discussed that the last time she was seen was Jan 2018 for seizures and that Dr. Leta Baptist wanted to f/u in 6 months. She will call back to make an appt when she can write it down. Also, I advised her to call her PCP for her headache unless it was the worst headache of her life or associated with any neurological changes/stroke symptoms (ex. Unilateral weakness, facial droop, nausea, speech changes, balance problems) in which case she is advised to go to the ED. She verbalized understanding and will contact her PCP.

## 2017-03-13 ENCOUNTER — Other Ambulatory Visit: Payer: Self-pay | Admitting: Family Medicine

## 2017-03-13 DIAGNOSIS — E782 Mixed hyperlipidemia: Secondary | ICD-10-CM

## 2017-03-15 ENCOUNTER — Encounter: Payer: Self-pay | Admitting: Family Medicine

## 2017-03-15 ENCOUNTER — Ambulatory Visit: Payer: Medicare Other | Attending: Family Medicine | Admitting: Family Medicine

## 2017-03-15 VITALS — BP 104/71 | HR 75 | Temp 97.7°F | Ht 66.0 in | Wt 141.4 lb

## 2017-03-15 DIAGNOSIS — F1410 Cocaine abuse, uncomplicated: Secondary | ICD-10-CM | POA: Diagnosis not present

## 2017-03-15 DIAGNOSIS — M51379 Other intervertebral disc degeneration, lumbosacral region without mention of lumbar back pain or lower extremity pain: Secondary | ICD-10-CM

## 2017-03-15 DIAGNOSIS — G4700 Insomnia, unspecified: Secondary | ICD-10-CM | POA: Diagnosis not present

## 2017-03-15 DIAGNOSIS — K219 Gastro-esophageal reflux disease without esophagitis: Secondary | ICD-10-CM | POA: Insufficient documentation

## 2017-03-15 DIAGNOSIS — M5137 Other intervertebral disc degeneration, lumbosacral region: Secondary | ICD-10-CM | POA: Insufficient documentation

## 2017-03-15 DIAGNOSIS — G4709 Other insomnia: Secondary | ICD-10-CM

## 2017-03-15 DIAGNOSIS — F419 Anxiety disorder, unspecified: Secondary | ICD-10-CM | POA: Diagnosis not present

## 2017-03-15 DIAGNOSIS — R569 Unspecified convulsions: Secondary | ICD-10-CM | POA: Insufficient documentation

## 2017-03-15 DIAGNOSIS — F331 Major depressive disorder, recurrent, moderate: Secondary | ICD-10-CM | POA: Insufficient documentation

## 2017-03-15 DIAGNOSIS — E782 Mixed hyperlipidemia: Secondary | ICD-10-CM | POA: Diagnosis not present

## 2017-03-15 DIAGNOSIS — R232 Flushing: Secondary | ICD-10-CM

## 2017-03-15 DIAGNOSIS — Z79899 Other long term (current) drug therapy: Secondary | ICD-10-CM | POA: Insufficient documentation

## 2017-03-15 DIAGNOSIS — N951 Menopausal and female climacteric states: Secondary | ICD-10-CM | POA: Insufficient documentation

## 2017-03-15 DIAGNOSIS — M25551 Pain in right hip: Secondary | ICD-10-CM | POA: Insufficient documentation

## 2017-03-15 MED ORDER — IBUPROFEN 600 MG PO TABS: 600.0000 mg | ORAL_TABLET | Freq: Three times a day (TID) | ORAL | 1 refills | Status: DC | PRN

## 2017-03-15 MED ORDER — CLONIDINE HCL 0.1 MG PO TABS: 0.1000 mg | ORAL_TABLET | Freq: Every day | ORAL | 3 refills | Status: DC

## 2017-03-15 MED ORDER — CYCLOBENZAPRINE HCL 10 MG PO TABS: 10.0000 mg | ORAL_TABLET | Freq: Two times a day (BID) | ORAL | 1 refills | Status: DC | PRN

## 2017-03-15 MED ORDER — SIMVASTATIN 20 MG PO TABS: 20.0000 mg | ORAL_TABLET | Freq: Every evening | ORAL | 2 refills | Status: DC

## 2017-03-15 NOTE — Progress Notes (Signed)
Subjective:  Patient ID: [name], female    DOB: 11/15/1950  Age: 66 y.o. MRN: [id number]  CC: Sleeping Problem and Hot Flashes   HPI [name] is a 66 year old female with a history of seizures (managed by neurology), depression (managed by [facility]), hyperlipidemia who presents today complaining of worsening hot flashes for the last 3 days which is associated with insomnia. She complains of sweating a lot and waking up every 20 minutes at night. She was prescribed Remeron and trazodone by her psychiatrist but she has only been taking the trazodone. Her psychiatrist is at [facility] and her last visit was a while ago but she has had her medications being delivered to her at home. Denies recent seizures.  She has abused cocaine for the last 20 years and is deciding to quit; last used 2 days ago.  She does not want inpatient rehab but is wondering if she could receive a shot to stop her cravings.  She does have right hip pain and low back pain which is relieved by taking Advil.  She does have a history of degenerative disease of the lumbar spine.  Pain does not radiate down her lower extremities. Denies history of trauma.   Past Medical History:  Diagnosis Date  . Acid reflux   . Anxiety   . Depression   . Gastric ulcer   . High cholesterol   . Interstitial cystitis   . Mood disorder ([place])   . MRSA (methicillin resistant staph aureus) culture positive   . Pneumonia 2017  . Seizures ([place])     Past Surgical History:  Procedure Laterality Date  . CATARACT EXTRACTION    . MULTIPLE TOOTH EXTRACTIONS    . ROTATOR CUFF REPAIR     R side  . SHOULDER ARTHROSCOPY      Outpatient Medications Prior to Visit  Medication Sig Dispense Refill  . traZODone (DESYREL) 150 MG tablet Take 150 mg by mouth at bedtime.     . simvastatin (ZOCOR) 20 MG tablet TAKE 1 TABLET BY MOUTH EVERY EVENING 30 tablet 2  . ARIPiprazole (ABILIFY) 15 MG tablet Take 15 mg by mouth daily.     . benzonatate  (TESSALON PERLES) 100 MG capsule Take 1 capsule (100 mg total) by mouth 3 (three) times daily as needed for cough. (Patient not taking: Reported on 03/15/2017) 21 capsule 0  . cetirizine (ZYRTEC) 10 MG tablet Take 1 tablet (10 mg total) by mouth daily. (Patient not taking: Reported on 03/15/2017) 30 tablet 2  . clonazePAM (KLONOPIN) 0.5 MG tablet Take 0.5 mg by mouth at bedtime as needed for anxiety.     Marland Kitchen dextromethorphan 15 MG/5ML syrup Take 10 mLs (30 mg total) by mouth 4 (four) times daily as needed for cough. (Patient not taking: Reported on 10/21/2016) 120 mL 0  . FLUoxetine (PROZAC) 40 MG capsule Take 40 mg by mouth daily.     . fluticasone (FLONASE) 50 MCG/ACT nasal spray Place 2 sprays into both nostrils daily. (Patient not taking: Reported on 03/15/2017) 16 g 2  . lamoTRIgine (LAMICTAL) 100 MG tablet Take 1 tablet (100 mg) in the  Morning and Take 2 tablets (200 mg) in evening (Patient not taking: Reported on 03/15/2017) 90 tablet 12  . mirtazapine (REMERON) 45 MG tablet Take 45 mg by mouth at bedtime.   0  . oxymetazoline (AFRIN NASAL SPRAY) 0.05 % nasal spray Place 1 spray into both nostrils 2 (two) times daily. (Patient not taking: Reported on 10/21/2016) 30  mL 0  . varenicline (CHANTIX) 0.5 MG tablet Take 1 tablet (0.5 mg total) by mouth 2 (two) times daily. (Patient not taking: Reported on 03/15/2017) 60 tablet 1  . cyclobenzaprine (FLEXERIL) 10 MG tablet Take 1 tablet (10 mg total) by mouth 2 (two) times daily as needed for muscle spasms. (Patient not taking: Reported on 03/15/2017) 60 tablet 1  . ibuprofen (ADVIL,MOTRIN) 600 MG tablet Take 1 tablet (600 mg total) by mouth every 8 (eight) hours as needed. (Patient not taking: Reported on 03/15/2017) 60 tablet 1   No facility-administered medications prior to visit.     ROS Review of Systems  Constitutional: Negative for activity change, appetite change and fatigue.  HENT: Negative for congestion, sinus pressure and sore throat.     Eyes: Negative for visual disturbance.  Respiratory: Negative for cough, chest tightness, shortness of breath and wheezing.   Cardiovascular: Negative for chest pain and palpitations.  Gastrointestinal: Negative for abdominal distention, abdominal pain and constipation.  Endocrine: Negative for polydipsia.  Genitourinary: Negative for dysuria and frequency.  Musculoskeletal: Positive for back pain. Negative for arthralgias.       R hip pain  Skin: Negative for rash.  Neurological: Negative for tremors, light-headedness and numbness.  Hematological: Does not bruise/bleed easily.  Psychiatric/Behavioral: Positive for sleep disturbance. Negative for agitation and behavioral problems.    Objective:  BP 104/71   Pulse 75   Temp 97.7 F (36.5 C) (Oral)   Ht 5' 6"  (1.676 m)   Wt 141 lb 6.4 oz (64.1 kg)   SpO2 98%   BMI 22.82 kg/m   BP/Weight 03/15/2017 11/30/2016 9/47/0962  Systolic BP 836 629 476  Diastolic BP 71 67 65  Wt. (Lbs) 141.4 134.6 -  BMI 22.82 21.73 -      Physical Exam  Constitutional: She is oriented to person, place, and time. She appears well-developed and well-nourished.  Cardiovascular: Normal rate, normal heart sounds and intact distal pulses.  No murmur heard. Pulmonary/Chest: Effort normal and breath sounds normal. She has no wheezes. She has no rales. She exhibits no tenderness.  Abdominal: Soft. Bowel sounds are normal. She exhibits no distension and no mass. There is no tenderness.  Musculoskeletal: Normal range of motion. She exhibits no edema or tenderness.  Neurological: She is alert and oriented to person, place, and time.  Skin: Skin is warm.  Psychiatric: She has a normal mood and affect.    CMP Latest Ref Rng & Units 01/22/2015 08/23/2014 02/09/2013  Glucose 65 - 99 mg/dL 64(L) 96 97  BUN 8 - 27 mg/dL 12 16 13   Creatinine 0.57 - 1.00 mg/dL 0.88 0.81 0.87  Sodium 134 - 144 mmol/L 140 137 136  Potassium 3.5 - 5.2 mmol/L 4.4 4.5 4.0  Chloride 97  - 108 mmol/L 104 108 101  CO2 18 - 29 mmol/L 23 23 23   Calcium 8.7 - 10.3 mg/dL 9.1 9.1 9.8  Total Protein 6.0 - 8.5 g/dL 6.3 7.5 7.6  Total Bilirubin 0.0 - 1.2 mg/dL <0.2 0.6 0.3  Alkaline Phos 39 - 117 IU/L 74 77 86  AST 0 - 40 IU/L 10 12(L) 16  ALT 0 - 32 IU/L 5 10(L) 18    Lipid Panel     Component Value Date/Time   CHOL 161 01/12/2007 2140   TRIG 153 (H) 01/12/2007 2140   HDL 62 01/12/2007 2140   CHOLHDL 2.6 Ratio 01/12/2007 2140   VLDL 31 01/12/2007 2140   LDLCALC 68 01/12/2007 2140  LDLDIRECT 160 05/29/2006     Assessment & Plan:   1. DEGENERATIVE DISC DISEASE, LUMBOSACRAL SPINE Stable - cyclobenzaprine (FLEXERIL) 10 MG tablet; Take 1 tablet (10 mg total) by mouth 2 (two) times daily as needed for muscle spasms.  Dispense: 60 tablet; Refill: 1 - ibuprofen (ADVIL,MOTRIN) 600 MG tablet; Take 1 tablet (600 mg total) by mouth every 8 (eight) hours as needed.  Dispense: 60 tablet; Refill: 1  2. Right hip pain Likely underlying osteoarthritis Placed on ibuprofen  3. Hot flashes Commence clonidine which will also help with insomnia Discussed side effects including bradycardia and hypotension and patient is to monitor her blood pressure while on this - cloNIDine (CATAPRES) 0.1 MG tablet; Take 1 tablet (0.1 mg total) by mouth at bedtime. For hot flashes  Dispense: 30 tablet; Refill: 3  4. Cocaine abuse ([place]) She is ready to quit LCSW to get in touch with the patient regarding rehab options  5. Moderate episode of recurrent major depressive disorder (HCC) Currently not in crisis Followed by [facility] Encouraged to continue with medications - CBC with Differential/Platelet  6. HYPERLIPIDEMIA, MIXED Controlled Low-cholesterol diet - CMP14+EGFR - Lipid panel - simvastatin (ZOCOR) 20 MG tablet; Take 1 tablet (20 mg total) by mouth every evening.  Dispense: 30 tablet; Refill: 2  7. Other insomnia Currently on trazodone and Remeron Hopefully addition of clonidine will  be helpful   Meds ordered this encounter  Medications  . cloNIDine (CATAPRES) 0.1 MG tablet    Sig: Take 1 tablet (0.1 mg total) by mouth at bedtime. For hot flashes    Dispense:  30 tablet    Refill:  3  . cyclobenzaprine (FLEXERIL) 10 MG tablet    Sig: Take 1 tablet (10 mg total) by mouth 2 (two) times daily as needed for muscle spasms.    Dispense:  60 tablet    Refill:  1  . ibuprofen (ADVIL,MOTRIN) 600 MG tablet    Sig: Take 1 tablet (600 mg total) by mouth every 8 (eight) hours as needed.    Dispense:  60 tablet    Refill:  1  . simvastatin (ZOCOR) 20 MG tablet    Sig: Take 1 tablet (20 mg total) by mouth every evening.    Dispense:  30 tablet    Refill:  2    Maximum Refills Reached    Follow-up: Return in about 3 months (around 06/15/2017) for Follow-up of chronic medical conditions.   Arnoldo Morale MD

## 2017-03-15 NOTE — Patient Instructions (Signed)
Menopause Menopause is the normal time of life when menstrual periods stop completely. Menopause is complete when you have missed 12 consecutive menstrual periods. It usually occurs between the ages of 48 years and 55 years. Very rarely does a woman develop menopause before the age of 40 years. At menopause, your ovaries stop producing the female hormones estrogen and progesterone. This can cause undesirable symptoms and also affect your health. Sometimes the symptoms may occur 4-5 years before the menopause begins. There is no relationship between menopause and:  Oral contraceptives.  Number of children you had.  Race.  The age your menstrual periods started (menarche).  Heavy smokers and very thin women may develop menopause earlier in life. What are the causes?  The ovaries stop producing the female hormones estrogen and progesterone. Other causes include:  Surgery to remove both ovaries.  The ovaries stop functioning for no known reason.  Tumors of the pituitary gland in the brain.  Medical disease that affects the ovaries and hormone production.  Radiation treatment to the abdomen or pelvis.  Chemotherapy that affects the ovaries.  What are the signs or symptoms?  Hot flashes.  Night sweats.  Decrease in sex drive.  Vaginal dryness and thinning of the vagina causing painful intercourse.  Dryness of the skin and developing wrinkles.  Headaches.  Tiredness.  Irritability.  Memory problems.  Weight gain.  Bladder infections.  Hair growth of the face and chest.  Infertility. More serious symptoms include:  Loss of bone (osteoporosis) causing breaks (fractures).  Depression.  Hardening and narrowing of the arteries (atherosclerosis) causing heart attacks and strokes.  How is this diagnosed?  When the menstrual periods have stopped for 12 straight months.  Physical exam.  Hormone studies of the blood. How is this treated? There are many treatment  choices and nearly as many questions about them. The decisions to treat or not to treat menopausal changes is an individual choice made with your health care provider. Your health care provider can discuss the treatments with you. Together, you can decide which treatment will work best for you. Your treatment choices may include:  Hormone therapy (estrogen and progesterone).  Non-hormonal medicines.  Treating the individual symptoms with medicine (for example antidepressants for depression).  Herbal medicines that may help specific symptoms.  Counseling by a psychiatrist or psychologist.  Group therapy.  Lifestyle changes including: ? Eating healthy. ? Regular exercise. ? Limiting caffeine and alcohol. ? Stress management and meditation.  No treatment.  Follow these instructions at home:  Take the medicine your health care provider gives you as directed.  Get plenty of sleep and rest.  Exercise regularly.  Eat a diet that contains calcium (good for the bones) and soy products (acts like estrogen hormone).  Avoid alcoholic beverages.  Do not smoke.  If you have hot flashes, dress in layers.  Take supplements, calcium, and vitamin D to strengthen bones.  You can use over-the-counter lubricants or moisturizers for vaginal dryness.  Group therapy is sometimes very helpful.  Acupuncture may be helpful in some cases. Contact a health care provider if:  You are not sure you are in menopause.  You are having menopausal symptoms and need advice and treatment.  You are still having menstrual periods after age 55 years.  You have pain with intercourse.  Menopause is complete (no menstrual period for 12 months) and you develop vaginal bleeding.  You need a referral to a specialist (gynecologist, psychiatrist, or psychologist) for treatment. Get help right   away if:  You have severe depression.  You have excessive vaginal bleeding.  You fell and think you have a  broken bone.  You have pain when you urinate.  You develop leg or chest pain.  You have a fast pounding heart beat (palpitations).  You have severe headaches.  You develop vision problems.  You feel a lump in your breast.  You have abdominal pain or severe indigestion. This information is not intended to replace advice given to you by your health care provider. Make sure you discuss any questions you have with your health care provider. Document Released: 06/25/2003 Document Revised: 09/10/2015 Document Reviewed: 11/01/2012 Elsevier Interactive Patient Education  2017 [employer]  

## 2017-03-16 ENCOUNTER — Telehealth: Payer: Self-pay

## 2017-03-16 ENCOUNTER — Other Ambulatory Visit: Payer: Self-pay | Admitting: Family Medicine

## 2017-03-16 DIAGNOSIS — E782 Mixed hyperlipidemia: Secondary | ICD-10-CM

## 2017-03-16 LAB — CMP14+EGFR
ALT: 32 [IU]/L (ref 0–32)
AST: 25 [IU]/L (ref 0–40)
Albumin/Globulin Ratio: 1.7 (ref 1.2–2.2)
Albumin: 4.7 g/dL (ref 3.6–4.8)
Alkaline Phosphatase: 80 [IU]/L (ref 39–117)
BUN/Creatinine Ratio: 19 (ref 12–28)
BUN: 18 mg/dL (ref 8–27)
Bilirubin Total: 0.3 mg/dL (ref 0.0–1.2)
CO2: 22 mmol/L (ref 20–29)
Calcium: 10.2 mg/dL (ref 8.7–10.3)
Chloride: 101 mmol/L (ref 96–106)
Creatinine, Ser: 0.93 mg/dL (ref 0.57–1.00)
GFR calc Af Amer: 75 mL/min/{1.73_m2}
GFR calc non Af Amer: 65 mL/min/{1.73_m2}
Globulin, Total: 2.8 g/dL (ref 1.5–4.5)
Glucose: 93 mg/dL (ref 65–99)
Potassium: 4.9 mmol/L (ref 3.5–5.2)
Sodium: 137 mmol/L (ref 134–144)
Total Protein: 7.5 g/dL (ref 6.0–8.5)

## 2017-03-16 LAB — CBC WITH DIFFERENTIAL/PLATELET
Basophils Absolute: 0.1 10*3/uL (ref 0.0–0.2)
Basos: 1 %
EOS (ABSOLUTE): 0.4 10*3/uL (ref 0.0–0.4)
Eos: 5 %
Hematocrit: 49 % — ABNORMAL HIGH (ref 34.0–46.6)
Hemoglobin: 17 g/dL — ABNORMAL HIGH (ref 11.1–15.9)
Immature Grans (Abs): 0 10*3/uL (ref 0.0–0.1)
Immature Granulocytes: 0 %
Lymphocytes Absolute: 3.4 10*3/uL — ABNORMAL HIGH (ref 0.7–3.1)
Lymphs: 37 %
MCH: 32.7 pg (ref 26.6–33.0)
MCHC: 34.7 g/dL (ref 31.5–35.7)
MCV: 94 fL (ref 79–97)
Monocytes Absolute: 0.8 10*3/uL (ref 0.1–0.9)
Monocytes: 9 %
Neutrophils Absolute: 4.5 10*3/uL (ref 1.4–7.0)
Neutrophils: 48 %
Platelets: 279 10*3/uL (ref 150–379)
RBC: 5.2 x10E6/uL (ref 3.77–5.28)
RDW: 14.1 % (ref 12.3–15.4)
WBC: 9.2 10*3/uL (ref 3.4–10.8)

## 2017-03-16 LAB — LIPID PANEL
Chol/HDL Ratio: 4.4 ratio (ref 0.0–4.4)
Cholesterol, Total: 227 mg/dL — ABNORMAL HIGH (ref 100–199)
HDL: 52 mg/dL
LDL Calculated: 124 mg/dL — ABNORMAL HIGH (ref 0–99)
Triglycerides: 257 mg/dL — ABNORMAL HIGH (ref 0–149)
VLDL Cholesterol Cal: 51 mg/dL — ABNORMAL HIGH (ref 5–40)

## 2017-03-16 MED ORDER — SIMVASTATIN 40 MG PO TABS: 40.0000 mg | ORAL_TABLET | Freq: Every evening | ORAL | 3 refills | Status: DC

## 2017-03-16 NOTE — Telephone Encounter (Signed)
Pt was called and informed of lab results and increase in medication.

## 2017-03-22 ENCOUNTER — Ambulatory Visit: Payer: Medicare Other | Attending: Family Medicine | Admitting: Licensed Clinical Social Worker

## 2017-03-22 ENCOUNTER — Ambulatory Visit: Payer: Medicare Other | Admitting: *Deleted

## 2017-03-22 VITALS — BP 97/71 | HR 86

## 2017-03-22 DIAGNOSIS — F331 Major depressive disorder, recurrent, moderate: Secondary | ICD-10-CM

## 2017-03-22 DIAGNOSIS — Z0130 Encounter for examination of blood pressure without abnormal findings: Secondary | ICD-10-CM

## 2017-03-22 DIAGNOSIS — F1410 Cocaine abuse, uncomplicated: Secondary | ICD-10-CM

## 2017-03-22 NOTE — Progress Notes (Signed)
Patient presented with a home wrist BP machine. Patient states the machine had been reading HIGH (170's and 950'H for the systolic number) MA calibrated the patients machine with the office machine.  Patient denied any headaches, tachycardia, or dizziness.

## 2017-03-22 NOTE — BH Specialist Note (Signed)
Integrated Behavioral Health Initial Visit  MRN: [id number] Name: [name]  Number of [place] Clinician visits:: 1/6 Session Start time: 2:00 PM  Session End time: 2:30 PM Total time: 30 minutes  Type of Service: [place] Interpretor:No. Interpretor Name and Language: N.A   Warm Hand Off Completed.       SUBJECTIVE: [name] is a 66 y.o. female accompanied by self Patient was referred by Dr. Jarold Song for depression, anxiety, and substance use. Patient reports the following symptoms/concerns: feelings of sadness and worry, difficulty sleeping, low energy, decreased concentration, and substance use Duration of problem: Ongoing Pt reports diagnosis of Bi-Polar; Severity of problem: moderate  OBJECTIVE: Mood: Anxious and Affect: Appropriate Risk of harm to self or others: No plan to harm self or others  LIFE CONTEXT: Family and Social: Pt receives emotional support from her brothers ( and [place]) Her brothers assist in managing pt's finances and provides transportation  School/Work: Pt receives disability ($800), Section 8, Food Stamps ($84), Medicaid, and Medicare Self-Care: Pt reports using crack cocaine (last use three days ago) She sells her Clonidine to purchase the crack cocaine; however, plans to stop Life Changes: Pt has a long hx of substance use. She is open to therapy stating, "I'm trying to do the right thing"  GOALS ADDRESSED: Patient will: 1. Reduce symptoms of: anxiety and depression 2. Increase knowledge and/or ability of: coping skills  3. Demonstrate ability to: Increase adequate support systems for patient/family and Decrease self-medicating behaviors  INTERVENTIONS: Interventions utilized: Motivational Interviewing, Supportive Counseling, Psychoeducation and/or Health Education and Link to [employer]  Standardized Assessments completed: GAD-7 and PHQ 2&9  ASSESSMENT: Patient currently  experiencing depression and anxiety triggered by ongoing substance use. She reports feelings of sadness and worry, difficulty sleeping, low energy, and decreased concentration. Pt receives strong support from her siblings who assist with managing finances and transportation.    Patient may benefit from psychotherapy. She is participating in medication management through [facility] and agreed to initiate therapy. LCSWA educated pt on the stages of change and discussed how relapse is a part of recovery. Pt had great insight on her substance use and triggers. She is not ready for inpatient treatment; however, will plan to attend an AA meeting in the near future. Pt also plans to reach out to a friend, who has maintained sobriety. LCSWA provided pt with resources for crisis intervention, therapy, and medicaid transportation.   PLAN: 1. Follow up with behavioral health clinician on : Pt was encouraged to contact LCSWA if symptoms worsen or fail to improve to schedule behavioral appointments at [facility] She plans to initiate therapy through [facility] 2. Behavioral recommendations: LCSWA recommends that pt apply healthy coping skills discussed, initiate psychotherapy, and utilize provided resources. Pt is encouraged to schedule follow up appointment with LCSWA 3. Referral(s): Armed forces logistics/support/administrative officer (LME/Outside Clinic) and [employer]:  Transportation 4. "From scale of 1-10, how likely are you to follow plan?": 8/10  Rebekah [place], LCSW 03/23/17 2:51 PM

## 2017-04-13 ENCOUNTER — Emergency Department (HOSPITAL_COMMUNITY): Payer: Medicare Other

## 2017-04-13 ENCOUNTER — Encounter (HOSPITAL_COMMUNITY): Payer: Self-pay | Admitting: Emergency Medicine

## 2017-04-13 ENCOUNTER — Other Ambulatory Visit: Payer: Self-pay

## 2017-04-13 ENCOUNTER — Emergency Department (HOSPITAL_COMMUNITY)
Admission: EM | Admit: 2017-04-13 | Discharge: 2017-04-13 | Disposition: A | Discharge status: Home / Self Care | Payer: Medicare Other | Attending: Emergency Medicine | Admitting: Emergency Medicine

## 2017-04-13 DIAGNOSIS — R059 Cough, unspecified: Secondary | ICD-10-CM

## 2017-04-13 DIAGNOSIS — Z79899 Other long term (current) drug therapy: Secondary | ICD-10-CM | POA: Insufficient documentation

## 2017-04-13 DIAGNOSIS — R062 Wheezing: Secondary | ICD-10-CM | POA: Insufficient documentation

## 2017-04-13 DIAGNOSIS — R05 Cough: Secondary | ICD-10-CM | POA: Diagnosis present

## 2017-04-13 DIAGNOSIS — B9689 Other specified bacterial agents as the cause of diseases classified elsewhere: Secondary | ICD-10-CM | POA: Diagnosis not present

## 2017-04-13 DIAGNOSIS — J189 Pneumonia, unspecified organism: Secondary | ICD-10-CM | POA: Diagnosis not present

## 2017-04-13 DIAGNOSIS — F17210 Nicotine dependence, cigarettes, uncomplicated: Secondary | ICD-10-CM | POA: Insufficient documentation

## 2017-04-13 DIAGNOSIS — J181 Lobar pneumonia, unspecified organism: Secondary | ICD-10-CM

## 2017-04-13 DIAGNOSIS — H1033 Unspecified acute conjunctivitis, bilateral: Secondary | ICD-10-CM | POA: Diagnosis not present

## 2017-04-13 MED ORDER — TETRACAINE HCL 0.5 % OP SOLN
2.0000 [drp] | Freq: Once | OPHTHALMIC | Status: AC
  Administered 2017-04-13: 2 [drp] via OPHTHALMIC
  Filled 2017-04-13: qty 4

## 2017-04-13 MED ORDER — FLUTICASONE PROPIONATE 50 MCG/ACT NA SUSP: 2.0000 | Freq: Every day | NASAL | 12 refills | Status: DC

## 2017-04-13 MED ORDER — DOXYCYCLINE HYCLATE 100 MG PO CAPS: 100.0000 mg | ORAL_CAPSULE | Freq: Two times a day (BID) | ORAL | 0 refills | Status: DC

## 2017-04-13 MED ORDER — FLUORESCEIN SODIUM 1 MG OP STRP
1.0000 | ORAL_STRIP | Freq: Once | OPHTHALMIC | Status: AC
  Administered 2017-04-13: 1 via OPHTHALMIC
  Filled 2017-04-13: qty 1

## 2017-04-13 MED ORDER — BENZONATATE 100 MG PO CAPS: 100.0000 mg | ORAL_CAPSULE | Freq: Three times a day (TID) | ORAL | 0 refills | Status: DC

## 2017-04-13 MED ORDER — ERYTHROMYCIN 5 MG/GM OP OINT: TOPICAL_OINTMENT | OPHTHALMIC | 0 refills | Status: DC

## 2017-04-13 MED ORDER — GUAIFENESIN-CODEINE 100-10 MG/5ML PO SOLN: 5.0000 mL | Freq: Three times a day (TID) | ORAL | 0 refills | Status: DC | PRN

## 2017-04-13 MED ORDER — ALBUTEROL SULFATE HFA 108 (90 BASE) MCG/ACT IN AERS
2.0000 | INHALATION_SPRAY | Freq: Once | RESPIRATORY_TRACT | Status: AC
  Administered 2017-04-13: 2 via RESPIRATORY_TRACT
  Filled 2017-04-13: qty 6.7

## 2017-04-13 MED ORDER — BENZONATATE 100 MG PO CAPS
100.0000 mg | ORAL_CAPSULE | Freq: Once | ORAL | Status: AC
  Administered 2017-04-13: 100 mg via ORAL
  Filled 2017-04-13: qty 1

## 2017-04-13 MED ORDER — IPRATROPIUM-ALBUTEROL 0.5-2.5 (3) MG/3ML IN SOLN
3.0000 mL | Freq: Once | RESPIRATORY_TRACT | Status: AC
  Administered 2017-04-13: 3 mL via RESPIRATORY_TRACT
  Filled 2017-04-13: qty 3

## 2017-04-13 MED ORDER — AMOXICILLIN-POT CLAVULANATE ER 1000-62.5 MG PO TB12: 2.0000 | ORAL_TABLET | Freq: Two times a day (BID) | ORAL | 0 refills | Status: DC

## 2017-04-13 NOTE — Discharge Instructions (Signed)
Your chest x-ray does show signs of pneumonia.  Given your history of smoking we will treat you with 2 different antibiotics including Augmentin and doxycycline.  Take these medications as prescribed and complete the entire course.  I give you Tessalon for cough.  Use the inhaler as needed for wheezing and cough.  Please limit your smoking.  I have also given you ointment to use for your infection of your eyes.  You may want to follow-up with a ophthalmologist if your eye symptoms not improving.  Make sure he also follow-up with your primary care doctor.  Return to the ED with any worsening symptoms.  Have given you the codeine cough syrup.  Please be careful this medication that can make you drowsy and suppress your breathing.  I would only take this for cough that is not relieved by over-the-counter medications and keeps you up at night.

## 2017-04-13 NOTE — ED Triage Notes (Signed)
Pt reports 4 day hx of sinus drainage, productive cough and eye pain with cloudy drainage .Tx with OTC meds for fever and cough. No audible wheezing

## 2017-04-13 NOTE — ED Provider Notes (Signed)
[place] DEPT Provider Note   CSN: [id number] Arrival date & time: 04/13/17  1128     History   Chief Complaint Chief Complaint  Patient presents with  . Cough  . Facial Pain  . Conjunctivitis    HPI [name] is a 66 y.o. female.  HPI 66 year old Caucasian female past medical history significant for depression, anxiety that presents to the emergency department today for evaluation of URI symptoms including rhinorrhea, nasal congestion, subjective fevers, eye discharge, productive cough.  The patient states that her symptoms started approximately 4-5 days ago.  She reports yellow-green nasal discharge and nasal congestion.  She also reports yellow-green mucus production with cough.  Patient states that she has not taken her temperature at home and has been elevated however is not elevated today.  Patient has tried several over-the-counter medications for her fever and cough with only little relief.  She reports wheezing at home.  The patient states that she is a pack a day smoker.  Has no underlying lung disease such as COPD or asthma.  She also reports purulent drainage from bilateral eyes and worse in the morning when she wakes up.  Denies any associated vision changes or eye pain.  Denies any itching of the eyes.  Patient not taking for the fever prior to arrival.  She denies any associated shortness of breath or chest pain.  Nothing makes patient's symptoms better or worse.  History of pneumonia and bronchitis and states this feels similar.  Patient does not wear contact lenses. Past Medical History:  Diagnosis Date  . Acid reflux   . Anxiety   . Depression   . Gastric ulcer   . High cholesterol   . Interstitial cystitis   . Mood disorder ([place])   . MRSA (methicillin resistant staph aureus) culture positive   . Pneumonia 2017  . Seizures [facility])     Patient Active Problem List   Diagnosis Date Noted  . Chronic sinusitis 06/10/2016  .  Headache(784.0) 11/18/2013  . Seizure disorder ([place]) 11/18/2013  . 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 Cocaine abuse ([place]) 02/09/2013  . Lethargy 02/09/2013  . SEBORRHEIC KERATOSIS 04/03/2007  . EPIDERMOID CYST, BACK 04/03/2007  . BREAST TENDERNESS 02/09/2007  . TOBACCO ABUSE 12/15/2006  . ALLERGIC RHINITIS, SEASONAL 12/15/2006  . GERD 12/15/2006  . DIVERTICULOSIS, COLON 12/15/2006  . [place] DISEASE, LUMBOSACRAL SPINE 12/15/2006  . ALCOHOL ABUSE, HX OF 12/15/2006  . 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 BARRETT'S ESOPHAGUS, HX OF 12/15/2006  . HYPERLIPIDEMIA, MIXED 12/13/2006  . Depression 12/13/2006  . INTERSTITIAL CYSTITIS 12/13/2006  . 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 CATARACT EXTRACTION    . MULTIPLE TOOTH EXTRACTIONS    . SHOULDER ARTHROSCOPY      OB History    No data available       Home Medications    Prior to Admission medications   Medication Sig Start Date End Date Taking? Authorizing Provider  acetaminophen (TYLENOL) 500 MG tablet Take 1,000 mg by mouth 2 (two) times daily as needed for mild pain (PAIN).   Yes [provider]  ARIPiprazole (ABILIFY) 15 MG tablet Take 15 mg by mouth daily.  09/28/16  Yes [provider]  clonazePAM (KLONOPIN) 0.5 MG tablet Take 0.5 mg by mouth at bedtime as needed for anxiety.  03/03/16  Yes [provider]  cloNIDine (CATAPRES) 0.1 MG tablet Take 1  tablet (0.1 mg total) by mouth at bedtime. For hot flashes 03/15/17  Yes Arnoldo Morale, MD  FLUoxetine (PROZAC) 40 MG capsule Take 40 mg by mouth daily.  03/02/16  Yes [provider]  fluticasone (FLONASE) 50 MCG/ACT nasal spray Place 2 sprays into both nostrils daily. 11/30/16  Yes Arnoldo Morale, MD  ibuprofen (ADVIL,MOTRIN) 600 MG tablet Take 1 tablet (600 mg total) by mouth every 8 (eight) hours as needed. 03/15/17  Yes Arnoldo Morale, MD  lamoTRIgine  (LAMICTAL) 100 MG tablet Take 1 tablet (100 mg) in the  Morning and Take 2 tablets (200 mg) in evening 05/18/16  Yes Penumalli, Vikram R, MD  mirtazapine (REMERON) 45 MG tablet Take 45 mg by mouth at bedtime.  12/03/14  Yes [provider]  Multiple Vitamins-Calcium (ONE-A-DAY WOMENS PO) Take 1 tablet by mouth daily.   Yes [provider]  oxymetazoline (AFRIN NASAL SPRAY) 0.05 % nasal spray Place 1 spray into both nostrils 2 (two) times daily. 04/01/15  Yes Nona Dell, PA-C  simvastatin (ZOCOR) 40 MG tablet Take 1 tablet (40 mg total) by mouth every evening. 03/16/17  Yes Arnoldo Morale, MD  traZODone (DESYREL) 150 MG tablet Take 300 mg by mouth at bedtime.  03/02/16  Yes [provider]  cetirizine (ZYRTEC) 10 MG tablet Take 1 tablet (10 mg total) by mouth daily. Patient not taking: Reported on 03/15/2017 11/30/16   Arnoldo Morale, MD  cyclobenzaprine (FLEXERIL) 10 MG tablet Take 1 tablet (10 mg total) by mouth 2 (two) times daily as needed for muscle spasms. Patient not taking: Reported on 04/13/2017 03/15/17   Arnoldo Morale, MD  dextromethorphan 15 MG/5ML syrup Take 10 mLs (30 mg total) by mouth 4 (four) times daily as needed for cough. Patient not taking: Reported on 10/21/2016 06/10/16   Arnoldo Morale, MD    Family History Family History  Adopted: Yes  Family history unknown: Yes    Social History Social History   Tobacco Use  . Smoking status: Current Every Day Smoker    Packs/day: 1.00    Years: 40.00    Pack years: 40.00    Types: Cigarettes  . Smokeless tobacco: Never Used  Substance Use Topics  . Alcohol use: No  . Drug use: No     Allergies   Haloperidol decanoate and Zithromax [azithromycin]   Review of Systems Review of Systems  Constitutional: Positive for fever. Negative for chills.  HENT: Positive for congestion, postnasal drip, rhinorrhea, sinus pressure, sinus pain, sneezing and sore throat. Negative for ear pain.     Respiratory: Positive for cough and wheezing. Negative for shortness of breath.   Cardiovascular: Negative for chest pain.  Gastrointestinal: Negative for vomiting.  Musculoskeletal: Negative for myalgias.     Physical Exam Updated Vital Signs BP 117/67 (BP Location: Left Arm)   Pulse 72   Temp 98 F (36.7 C) (Oral)   Resp 18   Wt 63.5 kg (140 lb)   SpO2 93%   BMI 22.60 kg/m   Physical Exam  Constitutional: She appears well-developed and well-nourished. No distress.  HENT:  Head: Normocephalic and atraumatic.  Right Ear: Tympanic membrane, external ear and ear canal normal.  Left Ear: Tympanic membrane, external ear and ear canal normal.  Nose: Mucosal edema and rhinorrhea present. Right sinus exhibits maxillary sinus tenderness and frontal sinus tenderness. Left sinus exhibits maxillary sinus tenderness and frontal sinus tenderness.  Mouth/Throat: Uvula is midline and mucous membranes are normal. No trismus in the jaw.  No uvula swelling. Posterior oropharyngeal erythema present. No oropharyngeal exudate, posterior oropharyngeal edema or tonsillar abscesses. No tonsillar exudate.  Eyes: Conjunctivae, EOM and lids are normal. Pupils are equal, round, and reactive to light. Right eye exhibits discharge. Left eye exhibits discharge. No scleral icterus.  Slit lamp exam:      The right eye shows no corneal abrasion, no corneal ulcer, no hyphema and no fluorescein uptake.       The left eye shows no corneal abrasion, no corneal ulcer, no hyphema and no fluorescein uptake.  Neck: Normal range of motion. Neck supple.  Cardiovascular: Normal rate and regular rhythm.  Pulmonary/Chest: Effort normal and breath sounds normal. No stridor. No respiratory distress. She has no wheezes. She has no rales. She exhibits no tenderness.  Musculoskeletal: Normal range of motion.  No lower extremity edema or calf tenderness.  Lymphadenopathy:    She has no cervical adenopathy.  Neurological: She is  alert.  Skin: Skin is warm and dry. Capillary refill takes less than 2 seconds. No pallor.  Psychiatric: Her behavior is normal. Judgment and thought content normal.  Nursing note and vitals reviewed.    ED Treatments / Results  Labs (all labs ordered are listed, but only abnormal results are displayed) Labs Reviewed - No data to display  EKG  EKG Interpretation None       Radiology Dg Chest 2 View  Result Date: 04/13/2017 CLINICAL DATA:  One year of cough with increased severity recently. The cough has become productive over the past 5 days. Current smoker. EXAM: CHEST  2 VIEW COMPARISON:  PA and lateral chest x-ray of March 07, 2016 FINDINGS: The lungs are hyperinflated with hemidiaphragm flattening. There is subtle increased density at the left lung base. The heart and pulmonary vascularity are normal. The mediastinum is normal in width. The trachea is midline. The bony thorax exhibits no acute abnormality. IMPRESSION: COPD. Atelectasis or early pneumonia at the left lung base. Followup PA and lateral chest X-ray is recommended in 3-4 weeks following trial of antibiotic therapy to ensure resolution and exclude underlying malignancy. Electronically Signed   By: David  Martinique M.D.   On: 04/13/2017 13:16    Procedures Procedures (including critical care time)  Medications Ordered in ED Medications  benzonatate (TESSALON) capsule 100 mg (not administered)  tetracaine (PONTOCAINE) 0.5 % ophthalmic solution 2 drop (2 drops Both Eyes Given 04/13/17 1231)  fluorescein ophthalmic strip 1 strip (1 strip Both Eyes Given 04/13/17 1231)  ipratropium-albuterol (DUONEB) 0.5-2.5 (3) MG/3ML nebulizer solution 3 mL (3 mLs Nebulization Given 04/13/17 1241)     Initial Impression / Assessment and Plan / ED Course  I have reviewed the triage vital signs and the nursing notes.  Pertinent labs & imaging results that were available during my care of the patient were reviewed by me and  considered in my medical decision making (see chart for details).     A 66 year old presents to the ED for evaluation of URI symptoms with associated productive cough, intermittent fevers and eye discharge.  On exam patient is overall well-appearing and nontoxic.  She is afebrile in the ED.  No tachypnea noted.  Patient satting at 94% on room air.  Of note patient is also a chronic 1 pack a day smoker.  Lungs are clear to auscultation bilaterally.  No lower extremity edema or calf tenderness.  Regular rate and rhythm.  No fluorescein uptake on the eye exam concerning for corneal ulcer or abrasion.  Patient given DuoNeb  in the ED.  She is also given Tessalon.  Chest x-ray returned that shows concern for possible left lower lobe pneumonia.  Recommends antibiotic therapy and follow-up in 2-3 weeks for recheck.  Given patient's significant smoking history we will treat with Augmentin and doxycycline.  No signs of corneal abrasion or ulcer on the eye exam however given the purulent drainage will give erythromycin ointment for possible bacterial conjunctivitis.  Encouraged follow-up with PCP.  Patient does not wear contact lens..  Patient given Lavella Lemons but states this does not help.  States that when she has had pneumonia in the past they have given her codeine syrup.  I discussed with patient that I will give her a small amount however this medication is very sedated and she is to be very careful with it.  Patient saturation improved after DuoNeb.  Clinical presentation does not seem consistent with PE.  Do not feel that patient meets admission criteria given overall well-appearing with reassuring vital signs.  Counseled patient for approximately 10 minutes regarding smoking cessation. I discussed the risks of smoking and how smoking has affected their encounter here today. Patient not ready to quit at this time, however stressed follow up with their primary doctor when they are.  Pt is hemodynamically  stable, in NAD, & able to ambulate in the ED. Evaluation does not show pathology that would require ongoing emergent intervention or inpatient treatment. I explained the diagnosis to the patient. Pain has been managed & has no complaints prior to dc. Pt is comfortable with above plan and is stable for discharge at this time. All questions were answered prior to disposition. Strict return precautions for f/u to the ED were discussed. Encouraged follow up with PCP.     Final Clinical Impressions(s) / ED Diagnoses   Final diagnoses:  Cough  Acute bacterial conjunctivitis of both eyes  Community acquired pneumonia of left lower lobe of lung [facility])    ED Discharge Orders        Ordered    amoxicillin-clavulanate (AUGMENTIN XR) 1000-62.5 MG 12 hr tablet  2 times daily     04/13/17 1326    doxycycline (VIBRAMYCIN) 100 MG capsule  2 times daily     04/13/17 1326    benzonatate (TESSALON) 100 MG capsule  Every 8 hours     04/13/17 1326    fluticasone (FLONASE) 50 MCG/ACT nasal spray  Daily     04/13/17 1326    erythromycin ophthalmic ointment     04/13/17 1326    guaiFENesin-codeine 100-10 MG/5ML syrup  3 times daily PRN     04/13/17 1335       Aaron Edelman 04/13/17 1341    Tegeler, Gwenyth Allegra, MD 04/13/17 [phone]

## 2017-04-27 DIAGNOSIS — F319 Bipolar disorder, unspecified: Secondary | ICD-10-CM | POA: Diagnosis not present

## 2017-04-28 ENCOUNTER — Emergency Department (HOSPITAL_COMMUNITY): Payer: Medicare Other

## 2017-04-28 ENCOUNTER — Encounter (HOSPITAL_COMMUNITY): Payer: Self-pay | Admitting: Nurse Practitioner

## 2017-04-28 ENCOUNTER — Emergency Department (HOSPITAL_COMMUNITY)
Admission: EM | Admit: 2017-04-28 | Discharge: 2017-04-29 | Disposition: A | Discharge status: Home / Self Care | Payer: Medicare Other | Attending: Emergency Medicine | Admitting: Emergency Medicine

## 2017-04-28 DIAGNOSIS — F17210 Nicotine dependence, cigarettes, uncomplicated: Secondary | ICD-10-CM | POA: Insufficient documentation

## 2017-04-28 DIAGNOSIS — R0602 Shortness of breath: Secondary | ICD-10-CM | POA: Insufficient documentation

## 2017-04-28 DIAGNOSIS — J40 Bronchitis, not specified as acute or chronic: Secondary | ICD-10-CM | POA: Insufficient documentation

## 2017-04-28 DIAGNOSIS — R05 Cough: Secondary | ICD-10-CM | POA: Diagnosis present

## 2017-04-28 DIAGNOSIS — Z79899 Other long term (current) drug therapy: Secondary | ICD-10-CM | POA: Diagnosis not present

## 2017-04-28 NOTE — ED Triage Notes (Signed)
Pt is cough of productive cough that has been ongoing for about 3 weeks. Recently treated for URI, reports that she has not had much if any improvement.

## 2017-04-29 DIAGNOSIS — J40 Bronchitis, not specified as acute or chronic: Secondary | ICD-10-CM | POA: Diagnosis not present

## 2017-04-29 LAB — BRAIN NATRIURETIC PEPTIDE: B Natriuretic Peptide: 25.7 pg/mL (ref 0.0–100.0)

## 2017-04-29 LAB — CBC WITH DIFFERENTIAL/PLATELET
Basophils Absolute: 0.1 10*3/uL (ref 0.0–0.1)
Basophils Relative: 1 %
Eosinophils Absolute: 0.3 10*3/uL (ref 0.0–0.7)
Eosinophils Relative: 4 %
HCT: 39 % (ref 36.0–46.0)
Hemoglobin: 13.2 g/dL (ref 12.0–15.0)
Lymphocytes Relative: 34 %
Lymphs Abs: 2.6 10*3/uL (ref 0.7–4.0)
MCH: 32.4 pg (ref 26.0–34.0)
MCHC: 33.8 g/dL (ref 30.0–36.0)
MCV: 95.6 fL (ref 78.0–100.0)
Monocytes Absolute: 0.7 10*3/uL (ref 0.1–1.0)
Monocytes Relative: 9 %
Neutro Abs: 3.8 10*3/uL (ref 1.7–7.7)
Neutrophils Relative %: 52 %
Platelets: 306 10*3/uL (ref 150–400)
RBC: 4.08 MIL/uL (ref 3.87–5.11)
RDW: 13.2 % (ref 11.5–15.5)
WBC: 7.5 10*3/uL (ref 4.0–10.5)

## 2017-04-29 LAB — BASIC METABOLIC PANEL WITH GFR
Anion gap: 5 (ref 5–15)
BUN: 20 mg/dL (ref 6–20)
CO2: 27 mmol/L (ref 22–32)
Calcium: 8.9 mg/dL (ref 8.9–10.3)
Chloride: 107 mmol/L (ref 101–111)
Creatinine, Ser: 0.87 mg/dL (ref 0.44–1.00)
GFR calc Af Amer: >60 mL/min
GFR calc non Af Amer: >60 mL/min
Glucose, Bld: 116 mg/dL — ABNORMAL HIGH (ref 65–99)
Potassium: 3.4 mmol/L — ABNORMAL LOW (ref 3.5–5.1)
Sodium: 139 mmol/L (ref 135–145)

## 2017-04-29 LAB — D-DIMER, QUANTITATIVE: D-Dimer, Quant: 0.49 ug{FEU}/mL (ref 0.00–0.50)

## 2017-04-29 MED ORDER — IPRATROPIUM-ALBUTEROL 0.5-2.5 (3) MG/3ML IN SOLN
3.0000 mL | Freq: Once | RESPIRATORY_TRACT | Status: AC
  Administered 2017-04-29: 3 mL via RESPIRATORY_TRACT
  Filled 2017-04-29: qty 3

## 2017-04-29 MED ORDER — PREDNISONE 20 MG PO TABS
60.0000 mg | ORAL_TABLET | Freq: Once | ORAL | Status: AC
  Administered 2017-04-29: 60 mg via ORAL
  Filled 2017-04-29: qty 3

## 2017-04-29 MED ORDER — ALBUTEROL SULFATE HFA 108 (90 BASE) MCG/ACT IN AERS: 2.0000 | INHALATION_SPRAY | RESPIRATORY_TRACT | 0 refills | Status: DC | PRN

## 2017-04-29 MED ORDER — DOXYCYCLINE HYCLATE 100 MG PO CAPS: 100.0000 mg | ORAL_CAPSULE | Freq: Two times a day (BID) | ORAL | 0 refills | Status: DC

## 2017-04-29 MED ORDER — PREDNISONE 50 MG PO TABS: ORAL_TABLET | ORAL | 0 refills | Status: DC

## 2017-04-29 MED ORDER — GUAIFENESIN 100 MG/5ML PO SYRP: 100.0000 mg | ORAL_SOLUTION | ORAL | 0 refills | Status: DC | PRN

## 2017-04-29 NOTE — ED Notes (Signed)
Pt ambulated well in the hall and to the bathroom without assistance---- pt's O2 saturation remained 97% on room air while ambulating; no s/s increased respiratory efforts noted during ambulation and pt denied shortness of breath.

## 2017-04-29 NOTE — Discharge Instructions (Signed)
Take the antibiotics and steroids as prescribed.  Stop smoking.  Follow-up with your doctor.  Return to the ED if you develop new or worsening symptoms.

## 2017-04-29 NOTE — ED Provider Notes (Signed)
[facility] DEPT Provider Note   CSN: [id number] Arrival date & time: 04/28/17  1957     History   Chief Complaint Chief Complaint  Patient presents with  . Cough  . URI    HPI [name] is a 67 y.o. female.  Patient reports coughing and trouble breathing ongoing since before Christmas.  Was seen in this ED in December 27 and diagnosed with pneumonia.  She was prescribed Augmentin and doxycycline.  She did not pick up the Augmentin because the pharmacy did not have it completed the doxycycline about 1 week ago.  She continues to smoke about 1 pack a day.  She does not carry diagnosis of COPD or asthma.  Denies fever.  Has cough productive of green and yellow mucus.  Also has runny nose and sore throat.  She denies any abdominal pain, nausea or vomiting.  Denies any chest pain with coughing.  Has had sick contacts at home.  Denies any other underlying lung disease.  She did not receive a flu shot.  Has had bronchitis and pneumonia in the past and this feels similar.  Has never been admitted for her lung disease.   The history is provided by the patient.  Cough  Associated symptoms include rhinorrhea and shortness of breath. Pertinent negatives include no chest pain, no chills, no headaches and no myalgias.  URI   Associated symptoms include congestion, rhinorrhea and cough. Pertinent negatives include no chest pain, no abdominal pain, no nausea, no vomiting, no dysuria and no headaches.    Past Medical History:  Diagnosis Date  . Acid reflux   . Anxiety   . Depression   . Gastric ulcer   . High cholesterol   . Interstitial cystitis   . Mood disorder ([place])   . MRSA (methicillin resistant staph aureus) culture positive   . Pneumonia 2017  . Seizures [facility])     Patient Active Problem List   Diagnosis Date Noted  . Chronic sinusitis 06/10/2016  . Headache(784.0) 11/18/2013  . Seizure disorder ([place]) 11/18/2013  . 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 Cocaine abuse ([place]) 02/09/2013  . Lethargy 02/09/2013  . SEBORRHEIC KERATOSIS 04/03/2007  . EPIDERMOID CYST, BACK 04/03/2007  . BREAST TENDERNESS 02/09/2007  . TOBACCO ABUSE 12/15/2006  . ALLERGIC RHINITIS, SEASONAL 12/15/2006  . GERD 12/15/2006  . DIVERTICULOSIS, COLON 12/15/2006  . [place] DISEASE, LUMBOSACRAL SPINE 12/15/2006  . ALCOHOL ABUSE, HX OF 12/15/2006  . 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 BARRETT'S ESOPHAGUS, HX OF 12/15/2006  . HYPERLIPIDEMIA, MIXED 12/13/2006  . Depression 12/13/2006  . INTERSTITIAL CYSTITIS 12/13/2006  . 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 CATARACT EXTRACTION    . MULTIPLE TOOTH EXTRACTIONS    . SHOULDER ARTHROSCOPY      OB History    No data available       Home Medications    Prior to Admission medications   Medication Sig Start Date End Date Taking? Authorizing Provider  acetaminophen (TYLENOL) 500 MG tablet Take 1,000 mg by mouth 2 (two) times daily as needed for mild pain (PAIN).    [provider]  amoxicillin-clavulanate (AUGMENTIN XR) 1000-62.5 MG 12 hr tablet Take 2 tablets by mouth 2 (two) times daily. 04/13/17   Doristine Devoid, PA-C  ARIPiprazole (ABILIFY) 15 MG tablet Take 15 mg by mouth daily.  09/28/16   [provider]  benzonatate (TESSALON) 100  MG capsule Take 1 capsule (100 mg total) by mouth every 8 (eight) hours. 04/13/17   Doristine Devoid, PA-C  cetirizine (ZYRTEC) 10 MG tablet Take 1 tablet (10 mg total) by mouth daily. Patient not taking: Reported on 03/15/2017 11/30/16   Arnoldo Morale, MD  clonazePAM (KLONOPIN) 0.5 MG tablet Take 0.5 mg by mouth at bedtime as needed for anxiety.  03/03/16   [provider]  cloNIDine (CATAPRES) 0.1 MG tablet Take 1 tablet (0.1 mg total) by mouth at bedtime. For hot flashes 03/15/17   Arnoldo Morale, MD  cyclobenzaprine (FLEXERIL) 10 MG tablet Take 1  tablet (10 mg total) by mouth 2 (two) times daily as needed for muscle spasms. Patient not taking: Reported on 04/13/2017 03/15/17   Arnoldo Morale, MD  dextromethorphan 15 MG/5ML syrup Take 10 mLs (30 mg total) by mouth 4 (four) times daily as needed for cough. Patient not taking: Reported on 10/21/2016 06/10/16   Arnoldo Morale, MD  doxycycline (VIBRAMYCIN) 100 MG capsule Take 1 capsule (100 mg total) by mouth 2 (two) times daily. 04/13/17   Doristine Devoid, PA-C  erythromycin ophthalmic ointment Place a 1/2 inch ribbon of ointment into the lower eyelid. 04/13/17   Doristine Devoid, PA-C  FLUoxetine (PROZAC) 40 MG capsule Take 40 mg by mouth daily.  03/02/16   [provider]  fluticasone (FLONASE) 50 MCG/ACT nasal spray Place 2 sprays into both nostrils daily. 04/13/17   Doristine Devoid, PA-C  guaiFENesin-codeine 100-10 MG/5ML syrup Take 5 mLs by mouth 3 (three) times daily as needed for cough. 04/13/17   Doristine Devoid, PA-C  ibuprofen (ADVIL,MOTRIN) 600 MG tablet Take 1 tablet (600 mg total) by mouth every 8 (eight) hours as needed. 03/15/17   Arnoldo Morale, MD  lamoTRIgine (LAMICTAL) 100 MG tablet Take 1 tablet (100 mg) in the  Morning and Take 2 tablets (200 mg) in evening 05/18/16   Penumalli, Earlean Polka, MD  mirtazapine (REMERON) 45 MG tablet Take 45 mg by mouth at bedtime.  12/03/14   [provider]  Multiple Vitamins-Calcium (ONE-A-DAY WOMENS PO) Take 1 tablet by mouth daily.    [provider]  oxymetazoline (AFRIN NASAL SPRAY) 0.05 % nasal spray Place 1 spray into both nostrils 2 (two) times daily. 04/01/15   Nona Dell, PA-C  simvastatin (ZOCOR) 40 MG tablet Take 1 tablet (40 mg total) by mouth every evening. 03/16/17   Arnoldo Morale, MD  traZODone (DESYREL) 150 MG tablet Take 300 mg by mouth at bedtime.  03/02/16   [provider]    Family History Family History  Adopted: Yes  Family history unknown: Yes    Social  History Social History   Tobacco Use  . Smoking status: Current Every Day Smoker    Packs/day: 1.00    Years: 40.00    Pack years: 40.00    Types: Cigarettes  . Smokeless tobacco: Never Used  Substance Use Topics  . Alcohol use: No  . Drug use: No     Allergies   Haloperidol decanoate and Zithromax [azithromycin]   Review of Systems Review of Systems  Constitutional: Positive for activity change and appetite change. Negative for chills, fatigue and fever.  HENT: Positive for congestion and rhinorrhea.   Eyes: Negative for visual disturbance.  Respiratory: Positive for cough and shortness of breath.   Cardiovascular: Negative for chest pain.  Gastrointestinal: Negative for abdominal pain, nausea and vomiting.  Genitourinary: Negative for dysuria and hematuria.  Musculoskeletal: Negative  for arthralgias, back pain and myalgias.  Neurological: Negative for dizziness, weakness and headaches.   all other systems are negative except as noted in the HPI and PMH.     Physical Exam Updated Vital Signs BP 122/78 (BP Location: Left Arm)   Pulse 86   Temp 98.1 F (36.7 C) (Oral)   Resp 20   SpO2 100%   Physical Exam  Constitutional: She is oriented to person, place, and time. She appears well-developed and well-nourished. No distress.  Speaking full sentences  HENT:  Head: Normocephalic and atraumatic.  Mouth/Throat: Oropharynx is clear and moist. No oropharyngeal exudate.  Eyes: Conjunctivae and EOM are normal. Pupils are equal, round, and reactive to light.  Neck: Normal range of motion. Neck supple.  No meningismus.  Cardiovascular: Normal rate, regular rhythm, normal heart sounds and intact distal pulses.  No murmur heard. Pulmonary/Chest: Effort normal. No respiratory distress. She has wheezes.  Moving good air, faint scattered respiratory wheezing  Abdominal: Soft. There is no tenderness. There is no rebound and no guarding.  Musculoskeletal: Normal range of  motion. She exhibits no edema or tenderness.  Neurological: She is alert and oriented to person, place, and time. No cranial nerve deficit. She exhibits normal muscle tone. Coordination normal.   5/5 strength throughout. CN 2-12 intact.Equal grip strength.   Skin: Skin is warm.  Psychiatric: She has a normal mood and affect. Her behavior is normal.  Nursing note and vitals reviewed.    ED Treatments / Results  Labs (all labs ordered are listed, but only abnormal results are displayed) Labs Reviewed  BASIC METABOLIC PANEL - Abnormal; Notable for the following components:      Result Value   Potassium 3.4 (*)    Glucose, Bld 116 (*)    All other components within normal limits  CBC WITH DIFFERENTIAL/PLATELET  BRAIN NATRIURETIC PEPTIDE  D-DIMER, QUANTITATIVE (NOT AT [facility])    EKG  EKG Interpretation None       Radiology Dg Chest 2 View  Result Date: 04/28/2017 CLINICAL DATA:  Increasing cough and shortness of breath EXAM: CHEST  2 VIEW COMPARISON:  04/13/2017 FINDINGS: Hyperinflation with scarring or atelectasis at the bases. No acute consolidation or effusion. Cardiomediastinal silhouette within normal limits. No pneumothorax. IMPRESSION: Hyperinflation with atelectasis or scarring at the bases. No acute pulmonary infiltrate is seen. Electronically Signed   By: Donavan Foil M.D.   On: 04/28/2017 20:52    Procedures Procedures (including critical care time)  Medications Ordered in ED Medications  predniSONE (DELTASONE) tablet 60 mg (not administered)  ipratropium-albuterol (DUONEB) 0.5-2.5 (3) MG/3ML nebulizer solution 3 mL (3 mLs Nebulization Given 04/29/17 0014)     Initial Impression / Assessment and Plan / ED Course  I have reviewed the triage vital signs and the nursing notes.  Pertinent labs & imaging results that were available during my care of the patient were reviewed by me and considered in my medical decision making (see chart for details).    Patient with  persistent upper respiratory and bronchitis symptoms for the past 3 weeks.  Did not complete antibiotics as prescribed.  Continues to smoke.  Chest x-ray negative.  No pneumonia.  Labs obtained which are reassuring and d-dimer is negative.  Low suspicion for ACS, pulmonary embolism or heart failure.  Patient is amatory without desaturation.  Discussed smoking cessation, follow-up with PCP.  She is requesting additional codeine cough syrup which is declined.  Smoking cessation discussed with patient.  Will give course of  steroids and antibiotics and further treatment for bronchitis.  Follow-up with PCP.  Return precautions discussed. Final Clinical Impressions(s) / ED Diagnoses   Final diagnoses:  Bronchitis    ED Discharge Orders    None       Lavine Hargrove, Annie Main, MD 04/29/17 [phone]

## 2017-05-02 ENCOUNTER — Ambulatory Visit: Payer: Medicare Other | Admitting: Family Medicine

## 2017-05-04 ENCOUNTER — Ambulatory Visit: Payer: Medicare Other | Admitting: Family Medicine

## 2017-05-24 ENCOUNTER — Emergency Department (HOSPITAL_COMMUNITY)
Admission: EM | Admit: 2017-05-24 | Discharge: 2017-05-24 | Discharge status: Against Medical Advice | Payer: Medicare Other

## 2017-05-24 NOTE — ED Notes (Signed)
Pt called to be triaged with no response.  RN notified. 

## 2017-05-24 NOTE — ED Notes (Signed)
Pt called to be triaged with no response.  RN notified. 

## 2017-05-25 ENCOUNTER — Emergency Department (HOSPITAL_COMMUNITY)
Admission: EM | Admit: 2017-05-25 | Discharge: 2017-05-25 | Disposition: A | Discharge status: Home / Self Care | Payer: Medicare Other | Attending: Emergency Medicine | Admitting: Emergency Medicine

## 2017-05-25 ENCOUNTER — Other Ambulatory Visit: Payer: Self-pay

## 2017-05-25 ENCOUNTER — Encounter (HOSPITAL_COMMUNITY): Payer: Self-pay | Admitting: Emergency Medicine

## 2017-05-25 ENCOUNTER — Emergency Department (HOSPITAL_COMMUNITY): Payer: Medicare Other

## 2017-05-25 DIAGNOSIS — R05 Cough: Secondary | ICD-10-CM | POA: Diagnosis not present

## 2017-05-25 DIAGNOSIS — J069 Acute upper respiratory infection, unspecified: Secondary | ICD-10-CM

## 2017-05-25 DIAGNOSIS — F1410 Cocaine abuse, uncomplicated: Secondary | ICD-10-CM | POA: Insufficient documentation

## 2017-05-25 DIAGNOSIS — F419 Anxiety disorder, unspecified: Secondary | ICD-10-CM | POA: Diagnosis not present

## 2017-05-25 DIAGNOSIS — F17210 Nicotine dependence, cigarettes, uncomplicated: Secondary | ICD-10-CM | POA: Insufficient documentation

## 2017-05-25 DIAGNOSIS — Z79899 Other long term (current) drug therapy: Secondary | ICD-10-CM | POA: Diagnosis not present

## 2017-05-25 DIAGNOSIS — J40 Bronchitis, not specified as acute or chronic: Secondary | ICD-10-CM | POA: Insufficient documentation

## 2017-05-25 DIAGNOSIS — F329 Major depressive disorder, single episode, unspecified: Secondary | ICD-10-CM | POA: Diagnosis not present

## 2017-05-25 DIAGNOSIS — B9789 Other viral agents as the cause of diseases classified elsewhere: Secondary | ICD-10-CM

## 2017-05-25 DIAGNOSIS — R1110 Vomiting, unspecified: Secondary | ICD-10-CM | POA: Diagnosis not present

## 2017-05-25 MED ORDER — PREDNISONE 10 MG PO TABS: 40.0000 mg | ORAL_TABLET | Freq: Every day | ORAL | 0 refills | Status: AC

## 2017-05-25 MED ORDER — GUAIFENESIN 100 MG/5ML PO SYRP: 100.0000 mg | ORAL_SOLUTION | ORAL | 0 refills | Status: DC | PRN

## 2017-05-25 NOTE — ED Triage Notes (Signed)
Pt verbalizes ongoing cough/congestion post treatment for bronchitis.

## 2017-05-25 NOTE — ED Provider Notes (Signed)
[facility] DEPT Provider Note   CSN: [id number] Arrival date & time: 05/25/17  1941     History   Chief Complaint Chief Complaint  Patient presents with  . Cough    HPI [name] is a 67 y.o. female with a past medical history of tobacco abuse, mood disorder, who presents to ED for evaluation of ongoing dry cough for the past several months.  Also reports one episode of posttussive emesis earlier today.  She states that "I never got better after the pneumonia I had a year ago."  She has been seen and evaluated here for similar symptoms several weeks ago.  She states that she has not taken any antitussives at home.  She denies any chest pain, wheezing, hemoptysis, trouble breathing.  She reports sick contacts at home with similar symptoms.  She has never been diagnosed with COPD or asthma, although she tells me she does have "bronchitis that is flaring up."  She continues to smoke 1 pack a day despite counseling on smoking cessation.  HPI  Past Medical History:  Diagnosis Date  . Acid reflux   . Anxiety   . Depression   . Gastric ulcer   . High cholesterol   . Interstitial cystitis   . Mood disorder ([place])   . MRSA (methicillin resistant staph aureus) culture positive   . Pneumonia 2017  . Seizures [facility])     Patient Active Problem List   Diagnosis Date Noted  . Chronic sinusitis 06/10/2016  . Headache(784.0) 11/18/2013  . Seizure disorder ([place]) 11/18/2013  . 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 Cocaine abuse ([place]) 02/09/2013  . Lethargy 02/09/2013  . SEBORRHEIC KERATOSIS 04/03/2007  . EPIDERMOID CYST, BACK 04/03/2007  . BREAST TENDERNESS 02/09/2007  . TOBACCO ABUSE 12/15/2006  . ALLERGIC RHINITIS, SEASONAL 12/15/2006  . GERD 12/15/2006  . DIVERTICULOSIS, COLON 12/15/2006  . [place] DISEASE, LUMBOSACRAL SPINE 12/15/2006  . ALCOHOL ABUSE, HX OF 12/15/2006  . 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 BARRETT'S  ESOPHAGUS, HX OF 12/15/2006  . HYPERLIPIDEMIA, MIXED 12/13/2006  . Depression 12/13/2006  . INTERSTITIAL CYSTITIS 12/13/2006  . 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 CATARACT EXTRACTION    . MULTIPLE TOOTH EXTRACTIONS    . SHOULDER ARTHROSCOPY      OB History    No data available       Home Medications    Prior to Admission medications   Medication Sig Start Date End Date Taking? Authorizing Provider  acetaminophen (TYLENOL) 500 MG tablet Take 1,000 mg by mouth 2 (two) times daily as needed for mild pain (PAIN).    [provider]  albuterol (PROVENTIL HFA;VENTOLIN HFA) 108 (90 Base) MCG/ACT inhaler Inhale 2 puffs into the lungs every 4 (four) hours as needed for wheezing or shortness of breath. 04/29/17   Rancour, Annie Main, MD  amoxicillin-clavulanate (AUGMENTIN XR) 1000-62.5 MG 12 hr tablet Take 2 tablets by mouth 2 (two) times daily. 04/13/17   Doristine Devoid, PA-C  ARIPiprazole (ABILIFY) 15 MG tablet Take 15 mg by mouth daily.  09/28/16   [provider]  benzonatate (TESSALON) 100 MG capsule Take 1 capsule (100 mg total) by mouth every 8 (eight) hours. 04/13/17   Doristine Devoid, PA-C  cetirizine (ZYRTEC) 10 MG tablet Take 1 tablet (10 mg total) by mouth daily. Patient not taking: Reported on 03/15/2017 11/30/16   Charlott Rakes, MD  clonazePAM [place]bye [place])  0.5 MG tablet Take 0.5 mg by mouth at bedtime as needed for anxiety.  03/03/16   [provider]  cloNIDine (CATAPRES) 0.1 MG tablet Take 1 tablet (0.1 mg total) by mouth at bedtime. For hot flashes 03/15/17   Charlott Rakes, MD  cyclobenzaprine (FLEXERIL) 10 MG tablet Take 1 tablet (10 mg total) by mouth 2 (two) times daily as needed for muscle spasms. Patient not taking: Reported on 04/13/2017 03/15/17   Charlott Rakes, MD  dextromethorphan 15 MG/5ML syrup Take 10 mLs (30 mg total) by mouth 4 (four) times daily as needed for  cough. Patient not taking: Reported on 10/21/2016 06/10/16   Charlott Rakes, MD  doxycycline (VIBRAMYCIN) 100 MG capsule Take 1 capsule (100 mg total) by mouth 2 (two) times daily. 04/29/17   Rancour, Annie Main, MD  erythromycin ophthalmic ointment Place a 1/2 inch ribbon of ointment into the lower eyelid. 04/13/17   Doristine Devoid, PA-C  FLUoxetine (PROZAC) 40 MG capsule Take 40 mg by mouth daily.  03/02/16   [provider]  fluticasone (FLONASE) 50 MCG/ACT nasal spray Place 2 sprays into both nostrils daily. 04/13/17   Doristine Devoid, PA-C  guaifenesin (ROBITUSSIN) 100 MG/5ML syrup Take 5-10 mLs (100-200 mg total) by mouth every 4 (four) hours as needed for cough. 05/25/17   Jayse Hodkinson, PA-C  guaiFENesin-codeine 100-10 MG/5ML syrup Take 5 mLs by mouth 3 (three) times daily as needed for cough. 04/13/17   Doristine Devoid, PA-C  ibuprofen (ADVIL,MOTRIN) 600 MG tablet Take 1 tablet (600 mg total) by mouth every 8 (eight) hours as needed. 03/15/17   Charlott Rakes, MD  lamoTRIgine (LAMICTAL) 100 MG tablet Take 1 tablet (100 mg) in the  Morning and Take 2 tablets (200 mg) in evening 05/18/16   Penumalli, Earlean Polka, MD  mirtazapine (REMERON) 45 MG tablet Take 45 mg by mouth at bedtime.  12/03/14   [provider]  Multiple Vitamins-Calcium (ONE-A-DAY WOMENS PO) Take 1 tablet by mouth daily.    [provider]  oxymetazoline (AFRIN NASAL SPRAY) 0.05 % nasal spray Place 1 spray into both nostrils 2 (two) times daily. 04/01/15   Nona Dell, PA-C  predniSONE (DELTASONE) 10 MG tablet Take 4 tablets (40 mg total) by mouth daily for 5 days. 05/25/17 05/30/17  Juquan Reznick, PA-C  simvastatin (ZOCOR) 40 MG tablet Take 1 tablet (40 mg total) by mouth every evening. 03/16/17   Charlott Rakes, MD  traZODone (DESYREL) 150 MG tablet Take 300 mg by mouth at bedtime.  03/02/16   [provider]    Family History Family History  Adopted: Yes  Family history  unknown: Yes    Social History Social History   Tobacco Use  . Smoking status: Current Every Day Smoker    Packs/day: 1.00    Years: 40.00    Pack years: 40.00    Types: Cigarettes  . Smokeless tobacco: Never Used  Substance Use Topics  . Alcohol use: No  . Drug use: No     Allergies   Haloperidol decanoate and Zithromax [azithromycin]   Review of Systems Review of Systems  Constitutional: Negative for chills and fever.  HENT: Positive for sneezing. Negative for congestion, dental problem, drooling, mouth sores, rhinorrhea, sore throat, tinnitus, trouble swallowing and voice change.   Respiratory: Positive for cough. Negative for choking, chest tightness, shortness of breath and wheezing.   Cardiovascular: Negative for chest pain.  Gastrointestinal: Positive for vomiting. Negative for abdominal pain and nausea.  Skin: Negative for wound.     Physical Exam Updated Vital Signs BP 109/79 (BP Location: Left Arm)   Pulse 72   Temp 98.2 F (36.8 C) (Oral)   Resp 18   Wt 61.7 kg (136 lb)   SpO2 99%   BMI 21.95 kg/m   Physical Exam  Constitutional: She appears well-developed and well-nourished. No distress.  Nontoxic appearing and in no acute distress.  Speaking complete sentences without difficulty.  Does appear older than stated age and disheveled.  Room smells of tobacco. Cough noted.  HENT:  Head: Normocephalic and atraumatic.  Right Ear: Tympanic membrane normal.  Left Ear: Tympanic membrane normal.  Nose: Nose normal.  Mouth/Throat: Uvula is midline. No posterior oropharyngeal edema or posterior oropharyngeal erythema. No tonsillar exudate.  Eyes: Conjunctivae and EOM are normal. No scleral icterus.  Neck: Normal range of motion.  Cardiovascular: Normal rate, regular rhythm and normal heart sounds.  Pulmonary/Chest: Effort normal and breath sounds normal. No respiratory distress.  No wheezing or chest tightness noted.  Neurological: She is alert.  Skin: No  rash noted. She is not diaphoretic.  Psychiatric: She has a normal mood and affect.  Nursing note and vitals reviewed.    ED Treatments / Results  Labs (all labs ordered are listed, but only abnormal results are displayed) Labs Reviewed - No data to display  EKG  EKG Interpretation None       Radiology Dg Chest 2 View  Result Date: 05/25/2017 CLINICAL DATA:  Cough EXAM: CHEST  2 VIEW COMPARISON:  04/28/2017, 04/13/2017, 03/07/2016 radiographs and CT extremity 02/16/2008 FINDINGS: Linear scarring or atelectasis in the right lung base. Hyperinflation. No focal consolidation or effusion. Stable cardiomediastinal silhouette with right-sided aortic arch. No pneumothorax. IMPRESSION: 1. No acute consolidation or effusion 2. Linear scarring in the medial right base. 3. Right-sided aortic arch. Electronically Signed   By: Donavan Foil M.D.   On: 05/25/2017 18:29    Procedures Procedures (including critical care time)  Medications Ordered in ED Medications - No data to display   Initial Impression / Assessment and Plan / ED Course  I have reviewed the triage vital signs and the nursing notes.  Pertinent labs & imaging results that were available during my care of the patient were reviewed by me and considered in my medical decision making (see chart for details).     Patient presents to ED for evaluation of ongoing cough.  She denies any productive cough, hemoptysis, chest pain or shortness of breath.  She is satting at 99% on room air.  Her lungs are clear to auscultation bilaterally with no wheezing or chest tightness noted.  She is afebrile with no use of antipyretics.  Chest x-ray returned as negative for acute consolidation or effusion.  She continues to smoke although being counseled in the past.  I think there could be a degree of just chronic bronchitis that is causing her to have flareups of cough and I doubt ACS, PE or other cardiac cause based on her lack of chest pain,  shortness of breath and her unremarkable vital signs.  I did offer her steroids and cough medication.  She states that "[employer] do not work for me.  The hydrocodone cough syrup is too expensive for me, the only  medication that works that I can afford is the guaifenesin with codeine that the doctor gave me last time."  Per chart review, patient was given this when she was diagnosed with pneumonia but then  asked for it as her subsequent visit but was declined.  I do not think this is reasonable considering she has not tried any other medications during this episode.  Will instead give prednisone, guaifenesin without codeine and advised her to follow-up with primary care provider for further evaluation.  Patient again counseled on tobacco cessation.  Patient appears stable for discharge at this time.  Strict return precautions given.  Portions of this note were generated with Lobbyist. Dictation errors may occur despite best attempts at proofreading.   Final Clinical Impressions(s) / ED Diagnoses   Final diagnoses:  Viral URI with cough  Bronchitis    ED Discharge Orders        Ordered    predniSONE (DELTASONE) 10 MG tablet  Daily     05/25/17 1849    guaifenesin (ROBITUSSIN) 100 MG/5ML syrup  Every 4 hours PRN     05/25/17 1849       Delia Heady, PA-C 05/25/17 1900    Fredia Sorrow, MD 05/25/17 2028

## 2017-05-25 NOTE — ED Notes (Signed)
Pt called from the lobby with no repsonse 

## 2017-06-19 ENCOUNTER — Ambulatory Visit: Payer: Medicare Other | Admitting: Family Medicine

## 2017-06-19 ENCOUNTER — Other Ambulatory Visit: Payer: Self-pay | Admitting: Family Medicine

## 2017-06-22 ENCOUNTER — Telehealth: Payer: Self-pay | Admitting: Diagnostic Neuroimaging

## 2017-06-22 ENCOUNTER — Other Ambulatory Visit: Payer: Self-pay | Admitting: Family Medicine

## 2017-06-22 ENCOUNTER — Other Ambulatory Visit: Payer: Self-pay | Admitting: Diagnostic Neuroimaging

## 2017-06-22 NOTE — Telephone Encounter (Signed)
Pt is calling for a refill of lamoTRIgine (LAMICTAL) 100 MG tablet [address] [place], [place] [phone] (Phone) [phone] (Fax)    she is calling today because she will not have enough to get her through the weekend.  Pt has scheduled a 1 year f/u with Dr Leta Baptist 09-04 and is on wait list should he be able to see her earlier

## 2017-06-23 MED ORDER — LAMOTRIGINE 100 MG PO TABS: ORAL_TABLET | ORAL | 3 refills | Status: DC

## 2017-06-23 NOTE — Addendum Note (Signed)
Addended by: Brandon Melnick on: 06/23/2017 12:21 PM   Modules accepted: Orders

## 2017-06-23 NOTE — Telephone Encounter (Signed)
Done for 4 months.  To try to see NP sooner then 12/2017.

## 2017-06-26 NOTE — Telephone Encounter (Signed)
LMVM for pt that had availability for appt at 1430 with Dr. Leta Baptist if this worked for her.

## 2017-07-05 ENCOUNTER — Other Ambulatory Visit: Payer: Self-pay | Admitting: Family Medicine

## 2017-07-05 DIAGNOSIS — Z139 Encounter for screening, unspecified: Secondary | ICD-10-CM

## 2017-07-11 ENCOUNTER — Other Ambulatory Visit: Payer: Self-pay | Admitting: Family Medicine

## 2017-07-11 DIAGNOSIS — R232 Flushing: Secondary | ICD-10-CM

## 2017-07-24 ENCOUNTER — Ambulatory Visit: Payer: Medicare Other

## 2017-07-27 DIAGNOSIS — F319 Bipolar disorder, unspecified: Secondary | ICD-10-CM | POA: Diagnosis not present

## 2017-08-10 ENCOUNTER — Ambulatory Visit: Payer: Medicare Other

## 2017-09-05 ENCOUNTER — Ambulatory Visit
Admission: RE | Admit: 2017-09-05 | Discharge: 2017-09-05 | Disposition: A | Discharge status: Home / Self Care | Payer: Medicare Other | Source: Ambulatory Visit | Attending: Family Medicine | Admitting: Family Medicine

## 2017-09-05 DIAGNOSIS — Z139 Encounter for screening, unspecified: Secondary | ICD-10-CM

## 2017-09-05 DIAGNOSIS — Z1231 Encounter for screening mammogram for malignant neoplasm of breast: Secondary | ICD-10-CM | POA: Diagnosis not present

## 2017-09-07 ENCOUNTER — Telehealth: Payer: Self-pay

## 2017-09-07 NOTE — Telephone Encounter (Signed)
Patient was called and informed of results. 

## 2017-09-07 NOTE — Telephone Encounter (Signed)
-----   Message from Charlott Rakes, MD sent at 09/06/2017  1:30 PM EDT ----- Mammogram is negative for malignancy

## 2017-10-18 DIAGNOSIS — F319 Bipolar disorder, unspecified: Secondary | ICD-10-CM | POA: Diagnosis not present

## 2017-10-18 DIAGNOSIS — F411 Generalized anxiety disorder: Secondary | ICD-10-CM | POA: Diagnosis not present

## 2017-11-16 ENCOUNTER — Emergency Department (HOSPITAL_COMMUNITY)
Admission: EM | Admit: 2017-11-16 | Discharge: 2017-11-16 | Disposition: A | Discharge status: Home / Self Care | Payer: Medicare Other | Attending: Emergency Medicine | Admitting: Emergency Medicine

## 2017-11-16 ENCOUNTER — Emergency Department (HOSPITAL_COMMUNITY): Payer: Medicare Other

## 2017-11-16 ENCOUNTER — Encounter (HOSPITAL_COMMUNITY): Payer: Self-pay

## 2017-11-16 ENCOUNTER — Other Ambulatory Visit: Payer: Self-pay

## 2017-11-16 DIAGNOSIS — Z79899 Other long term (current) drug therapy: Secondary | ICD-10-CM | POA: Diagnosis not present

## 2017-11-16 DIAGNOSIS — M79674 Pain in right toe(s): Secondary | ICD-10-CM | POA: Diagnosis present

## 2017-11-16 DIAGNOSIS — F17210 Nicotine dependence, cigarettes, uncomplicated: Secondary | ICD-10-CM | POA: Insufficient documentation

## 2017-11-16 DIAGNOSIS — S99921A Unspecified injury of right foot, initial encounter: Secondary | ICD-10-CM | POA: Diagnosis not present

## 2017-11-16 NOTE — ED Triage Notes (Signed)
Pt states she "cracked" her right great toe yesterday and has had pain since that kept her awake at night.

## 2017-11-16 NOTE — Discharge Instructions (Addendum)
You were seen in the emergency department for right great toe pain.  This is likely muscular, ligamentous injury.  We discussed possibility of early cellulitis, however we will monitor the redness at this time.  If redness, swelling, warmth, pain worsen over the next 48 hours return to the ER as you may need antibiotics for cellulitis.  Otherwise, take 500 to 1000 mg of acetaminophen (Tylenol) every 8 hours for the next 5 days, ice, elevate.  Do not use ibuprofen, Aleve, Motrin, Advil if you have history of gastric ulcers or gastritis.

## 2017-11-16 NOTE — ED Provider Notes (Signed)
[facility] DEPT Provider Note   CSN: [id number] Arrival date & time: 11/16/17  1301     History   Chief Complaint Chief Complaint  Patient presents with  . Foot Pain    HPI [name] is a 67 y.o. female with history of anxiety, depression, gastric ulcer, remote history of cocaine abuse is here for evaluation of right great toe pain onset yesterday, sudden.  Patient was cracking all her toes and when she cracked her right great toe she heard a loud pop and sudden pain locally.  Woke up at midnight and was unable to fall back to sleep due to pain.  Took ibuprofen and ice which did not help.  Associated symptoms include mild swelling and redness.  Denies fevers, chills.  No IV drug use.  No history of gout.  HPI  Past Medical History:  Diagnosis Date  . Acid reflux   . Anxiety   . Depression   . Gastric ulcer   . High cholesterol   . Interstitial cystitis   . Mood disorder ([place])   . MRSA (methicillin resistant staph aureus) culture positive   . Pneumonia 2017  . Seizures [facility])     Patient Active Problem List   Diagnosis Date Noted  . Chronic sinusitis 06/10/2016  . Headache(784.0) 11/18/2013  . Seizure disorder ([place]) 11/18/2013  . 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 Cocaine abuse ([place]) 02/09/2013  . Lethargy 02/09/2013  . SEBORRHEIC KERATOSIS 04/03/2007  . EPIDERMOID CYST, BACK 04/03/2007  . BREAST TENDERNESS 02/09/2007  . TOBACCO ABUSE 12/15/2006  . ALLERGIC RHINITIS, SEASONAL 12/15/2006  . GERD 12/15/2006  . DIVERTICULOSIS, COLON 12/15/2006  . [place] DISEASE, LUMBOSACRAL SPINE 12/15/2006  . ALCOHOL ABUSE, HX OF 12/15/2006  . 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 BARRETT'S ESOPHAGUS, HX OF 12/15/2006  . HYPERLIPIDEMIA, MIXED 12/13/2006  . Depression 12/13/2006  . INTERSTITIAL CYSTITIS 12/13/2006  . 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  CATARACT EXTRACTION    . MULTIPLE TOOTH EXTRACTIONS    . SHOULDER ARTHROSCOPY       OB History   None      Home Medications    Prior to Admission medications   Medication Sig Start Date End Date Taking? Authorizing Provider  acetaminophen (TYLENOL) 500 MG tablet Take 1,000 mg by mouth 2 (two) times daily as needed for mild pain (PAIN).    [provider]  albuterol (PROVENTIL HFA;VENTOLIN HFA) 108 (90 Base) MCG/ACT inhaler Inhale 2 puffs into the lungs every 4 (four) hours as needed for wheezing or shortness of breath. 04/29/17   Rancour, Annie Main, MD  amoxicillin-clavulanate (AUGMENTIN XR) 1000-62.5 MG 12 hr tablet Take 2 tablets by mouth 2 (two) times daily. 04/13/17   Doristine Devoid, PA-C  ARIPiprazole (ABILIFY) 15 MG tablet Take 15 mg by mouth daily.  09/28/16   [provider]  benzonatate (TESSALON) 100 MG capsule Take 1 capsule (100 mg total) by mouth every 8 (eight) hours. 04/13/17   Doristine Devoid, PA-C  cetirizine (ZYRTEC) 10 MG tablet Take 1 tablet (10 mg total) by mouth daily. Patient not taking: Reported on 03/15/2017 11/30/16   Charlott Rakes, MD  clonazePAM (KLONOPIN) 0.5 MG tablet Take 0.5 mg by mouth at bedtime as needed for anxiety.  03/03/16   [provider]  cloNIDine (CATAPRES) 0.1 MG tablet TAKE 1 TABLET BY MOUTH EVERY NIGHT AT BEDTIME FOR HOT FLASHES  07/11/17   Charlott Rakes, MD  cyclobenzaprine (FLEXERIL) 10 MG tablet Take 1 tablet (10 mg total) by mouth 2 (two) times daily as needed for muscle spasms. Patient not taking: Reported on 04/13/2017 03/15/17   Charlott Rakes, MD  dextromethorphan 15 MG/5ML syrup Take 10 mLs (30 mg total) by mouth 4 (four) times daily as needed for cough. Patient not taking: Reported on 10/21/2016 06/10/16   Charlott Rakes, MD  doxycycline (VIBRAMYCIN) 100 MG capsule Take 1 capsule (100 mg total) by mouth 2 (two) times daily. 04/29/17   Rancour, Annie Main, MD  erythromycin ophthalmic ointment Place a 1/2  inch ribbon of ointment into the lower eyelid. 04/13/17   Doristine Devoid, PA-C  FLUoxetine (PROZAC) 40 MG capsule Take 40 mg by mouth daily.  03/02/16   [provider]  fluticasone (FLONASE) 50 MCG/ACT nasal spray Place 2 sprays into both nostrils daily. 04/13/17   Doristine Devoid, PA-C  guaifenesin (ROBITUSSIN) 100 MG/5ML syrup Take 5-10 mLs (100-200 mg total) by mouth every 4 (four) hours as needed for cough. 05/25/17   Khatri, Hina, PA-C  guaiFENesin-codeine 100-10 MG/5ML syrup Take 5 mLs by mouth 3 (three) times daily as needed for cough. 04/13/17   Doristine Devoid, PA-C  ibuprofen (ADVIL,MOTRIN) 600 MG tablet Take 1 tablet (600 mg total) by mouth every 8 (eight) hours as needed. 03/15/17   Charlott Rakes, MD  lamoTRIgine (LAMICTAL) 100 MG tablet TAKE 1 TABLET BY MOUTH EVERY MORNING AND 2 TABLETS IN THE EVENING 06/23/17   Penumalli, Vikram R, MD  mirtazapine (REMERON) 45 MG tablet Take 45 mg by mouth at bedtime.  12/03/14   [provider]  Multiple Vitamins-Calcium (ONE-A-DAY WOMENS PO) Take 1 tablet by mouth daily.    [provider]  oxymetazoline (AFRIN NASAL SPRAY) 0.05 % nasal spray Place 1 spray into both nostrils 2 (two) times daily. 04/01/15   Nona Dell, PA-C  simvastatin (ZOCOR) 20 MG tablet TAKE 1 TABLET BY MOUTH EVERY EVENING 07/11/17   Charlott Rakes, MD  simvastatin (ZOCOR) 40 MG tablet Take 1 tablet (40 mg total) by mouth every evening. 03/16/17   Charlott Rakes, MD  traZODone (DESYREL) 150 MG tablet Take 300 mg by mouth at bedtime.  03/02/16   [provider]    Family History Family History  Adopted: Yes  Family history unknown: Yes    Social History Social History   Tobacco Use  . Smoking status: Current Every Day Smoker    Packs/day: 1.00    Years: 40.00    Pack years: 40.00    Types: Cigarettes  . Smokeless tobacco: Never Used  Substance Use Topics  . Alcohol use: No  . Drug use: No      Allergies   Haloperidol decanoate   Review of Systems Review of Systems  Musculoskeletal: Positive for arthralgias and joint swelling.  Skin: Positive for color change.  All other systems reviewed and are negative.    Physical Exam Updated Vital Signs BP 112/75   Pulse 98   Temp 98.2 F (36.8 C) (Oral)   Resp 15   Ht 5\' 6"  (1.676 m)   Wt 59 kg (130 lb)   SpO2 100%   BMI 20.98 kg/m   Physical Exam  Constitutional: She is oriented to person, place, and time. She appears well-developed and well-nourished.  Non-toxic appearance.  HENT:  Head: Normocephalic.  Right Ear: External ear normal.  Left Ear: External ear normal.  Nose: Nose normal.  Eyes:  Conjunctivae and EOM are normal.  Neck: Full passive range of motion without pain.  Cardiovascular: Normal rate.  Good cap refill to distal great toes bilaterally.  2+ Dp and PT pulses bilaterally.  No asymmetric calf tenderness or edema.   Pulmonary/Chest: Effort normal. No tachypnea. No respiratory distress.  Musculoskeletal: Normal range of motion. She exhibits edema and tenderness.  Right great toe: Mild tenderness, edema and erythema to the dorsal aspect of the metatarsophalangeal joint of the right great toe, pain reproducible with passive flexion and extension. 5/5 strength with great toe flexion and extension.  Neurological: She is alert and oriented to person, place, and time.  Sensation to light touch in distal great toes bilaterally  Skin: Skin is warm and dry. Capillary refill takes less than 2 seconds.  Psychiatric: Her behavior is normal. Thought content normal.     ED Treatments / Results  Labs (all labs ordered are listed, but only abnormal results are displayed) Labs Reviewed - No data to display  EKG None  Radiology Dg Foot Complete Right  Result Date: 11/16/2017 CLINICAL DATA:  Injured right toe yesterday. EXAM: RIGHT FOOT COMPLETE - 3+ VIEW COMPARISON:  No recent prior. FINDINGS: Diffuse  osteopenia and degenerative change. Deformity noted the proximal scratched it deformity noted of the proximal middle phalanges of the right fifth digit, most likely congenital. No acute bony or joint abnormality. No evidence of fracture or dislocation. IMPRESSION: No acute abnormality. Electronically Signed   By: Marcello Moores  Register   On: 11/16/2017 13:35    Procedures Procedures (including critical care time)  Medications Ordered in ED Medications - No data to display   Initial Impression / Assessment and Plan / ED Course  I have reviewed the triage vital signs and the nursing notes.  Pertinent labs & imaging results that were available during my care of the patient were reviewed by me and considered in my medical decision making (see chart for details).    67 year old female here with traumatic pain, swelling and redness to the top of her right great toe MTP joint.  X-ray is negative.  Considered gout however she does not have history of the same.  She has minimal redness over the joint with a tiny area of ecchymosis, concerning for early cellulitis.  Also considered septic arthritis, patient adamantly denies IV drug use.  There are no track marks that I can see.  She has good passive range of motion of the joints making septic arthritis less likely.  At this time I will defer antibiotics as symptoms began suddenly after she cracked her toe which makes infectious or inflammatory arthritis less likely.  Will discharge with Tylenol given history of gastric ulcers, ice, elevation.  Patient is aware to monitor for redness, warmth, swelling and to return to the ER immediately if these worsen.  Final Clinical Impressions(s) / ED Diagnoses   Final diagnoses:  Pain of right great toe    ED Discharge Orders    None       Kinnie Feil, PA-C 11/16/17 1426    Isla Pence, MD 11/16/17 [phone]

## 2017-12-14 ENCOUNTER — Other Ambulatory Visit: Payer: Self-pay | Admitting: Diagnostic Neuroimaging

## 2017-12-14 NOTE — Telephone Encounter (Signed)
Refilled x 1 month with note to pharmacy re: patient needs to keep FU on 12/20/17 for further refills.

## 2017-12-20 ENCOUNTER — Ambulatory Visit (INDEPENDENT_AMBULATORY_CARE_PROVIDER_SITE_OTHER): Payer: Medicare Other | Admitting: Diagnostic Neuroimaging

## 2017-12-20 ENCOUNTER — Encounter: Payer: Self-pay | Admitting: Diagnostic Neuroimaging

## 2017-12-20 ENCOUNTER — Encounter

## 2017-12-20 VITALS — BP 107/74 | HR 79 | Ht 66.0 in | Wt 135.0 lb

## 2017-12-20 DIAGNOSIS — F32A Depression, unspecified: Secondary | ICD-10-CM

## 2017-12-20 DIAGNOSIS — G4700 Insomnia, unspecified: Secondary | ICD-10-CM | POA: Diagnosis not present

## 2017-12-20 DIAGNOSIS — F329 Major depressive disorder, single episode, unspecified: Secondary | ICD-10-CM | POA: Diagnosis not present

## 2017-12-20 DIAGNOSIS — R569 Unspecified convulsions: Secondary | ICD-10-CM

## 2017-12-20 MED ORDER — LAMOTRIGINE 100 MG PO TABS: ORAL_TABLET | ORAL | 12 refills | Status: DC
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
## 2017-12-31 DIAGNOSIS — E875 Hyperkalemia: Secondary | ICD-10-CM | POA: Diagnosis not present

## 2017-12-31 DIAGNOSIS — L400 Psoriasis vulgaris: Secondary | ICD-10-CM | POA: Diagnosis not present

## 2017-12-31 DIAGNOSIS — M1990 Unspecified osteoarthritis, unspecified site: Secondary | ICD-10-CM | POA: Diagnosis not present

## 2018-01-01 DIAGNOSIS — L400 Psoriasis vulgaris: Secondary | ICD-10-CM | POA: Diagnosis not present

## 2018-01-01 DIAGNOSIS — G8929 Other chronic pain: Secondary | ICD-10-CM | POA: Diagnosis not present

## 2018-01-01 DIAGNOSIS — M1990 Unspecified osteoarthritis, unspecified site: Secondary | ICD-10-CM | POA: Diagnosis not present

## 2018-01-01 DIAGNOSIS — E875 Hyperkalemia: Secondary | ICD-10-CM | POA: Diagnosis not present

## 2018-01-02 DIAGNOSIS — M25551 Pain in right hip: Secondary | ICD-10-CM | POA: Diagnosis not present

## 2018-01-02 DIAGNOSIS — M25552 Pain in left hip: Secondary | ICD-10-CM | POA: Diagnosis not present

## 2018-01-05 DIAGNOSIS — N951 Menopausal and female climacteric states: Secondary | ICD-10-CM | POA: Diagnosis not present

## 2018-01-05 DIAGNOSIS — N3010 Interstitial cystitis (chronic) without hematuria: Secondary | ICD-10-CM | POA: Diagnosis not present

## 2018-01-10 DIAGNOSIS — R350 Frequency of micturition: Secondary | ICD-10-CM | POA: Diagnosis not present

## 2018-01-15 DIAGNOSIS — M25552 Pain in left hip: Secondary | ICD-10-CM | POA: Diagnosis not present

## 2018-01-15 DIAGNOSIS — Z23 Encounter for immunization: Secondary | ICD-10-CM | POA: Diagnosis not present

## 2018-01-22 DIAGNOSIS — J309 Allergic rhinitis, unspecified: Secondary | ICD-10-CM | POA: Diagnosis not present

## 2018-01-22 DIAGNOSIS — J069 Acute upper respiratory infection, unspecified: Secondary | ICD-10-CM | POA: Diagnosis not present

## 2018-02-07 DIAGNOSIS — H04123 Dry eye syndrome of bilateral lacrimal glands: Secondary | ICD-10-CM | POA: Diagnosis not present

## 2018-02-07 DIAGNOSIS — H524 Presbyopia: Secondary | ICD-10-CM | POA: Diagnosis not present

## 2018-02-07 DIAGNOSIS — H02204 Unspecified lagophthalmos left upper eyelid: Secondary | ICD-10-CM | POA: Diagnosis not present

## 2018-02-07 DIAGNOSIS — H01025 Squamous blepharitis left lower eyelid: Secondary | ICD-10-CM | POA: Diagnosis not present

## 2018-02-07 DIAGNOSIS — H26493 Other secondary cataract, bilateral: Secondary | ICD-10-CM | POA: Diagnosis not present

## 2018-02-07 DIAGNOSIS — H01021 Squamous blepharitis right upper eyelid: Secondary | ICD-10-CM | POA: Diagnosis not present

## 2018-02-07 DIAGNOSIS — H01024 Squamous blepharitis left upper eyelid: Secondary | ICD-10-CM | POA: Diagnosis not present

## 2018-02-07 DIAGNOSIS — H01022 Squamous blepharitis right lower eyelid: Secondary | ICD-10-CM | POA: Diagnosis not present

## 2018-03-14 DIAGNOSIS — F411 Generalized anxiety disorder: Secondary | ICD-10-CM | POA: Diagnosis not present

## 2018-03-14 DIAGNOSIS — F319 Bipolar disorder, unspecified: Secondary | ICD-10-CM | POA: Diagnosis not present

## 2018-03-25 DIAGNOSIS — M1990 Unspecified osteoarthritis, unspecified site: Secondary | ICD-10-CM | POA: Diagnosis not present

## 2018-03-25 DIAGNOSIS — R569 Unspecified convulsions: Secondary | ICD-10-CM | POA: Diagnosis not present

## 2018-03-25 DIAGNOSIS — L400 Psoriasis vulgaris: Secondary | ICD-10-CM | POA: Diagnosis not present

## 2018-03-25 DIAGNOSIS — Z87820 Personal history of traumatic brain injury: Secondary | ICD-10-CM | POA: Diagnosis not present

## 2018-03-25 DIAGNOSIS — E875 Hyperkalemia: Secondary | ICD-10-CM | POA: Diagnosis not present

## 2018-03-26 DIAGNOSIS — E875 Hyperkalemia: Secondary | ICD-10-CM | POA: Diagnosis not present

## 2018-03-26 DIAGNOSIS — L400 Psoriasis vulgaris: Secondary | ICD-10-CM | POA: Diagnosis not present

## 2018-03-26 DIAGNOSIS — M1990 Unspecified osteoarthritis, unspecified site: Secondary | ICD-10-CM | POA: Diagnosis not present

## 2018-03-26 DIAGNOSIS — R569 Unspecified convulsions: Secondary | ICD-10-CM | POA: Diagnosis not present

## 2018-03-27 DIAGNOSIS — R569 Unspecified convulsions: Secondary | ICD-10-CM | POA: Diagnosis not present

## 2018-03-27 DIAGNOSIS — M1990 Unspecified osteoarthritis, unspecified site: Secondary | ICD-10-CM | POA: Diagnosis not present

## 2018-03-27 DIAGNOSIS — E875 Hyperkalemia: Secondary | ICD-10-CM | POA: Diagnosis not present

## 2018-03-27 DIAGNOSIS — L400 Psoriasis vulgaris: Secondary | ICD-10-CM | POA: Diagnosis not present

## 2018-03-28 DIAGNOSIS — L400 Psoriasis vulgaris: Secondary | ICD-10-CM | POA: Diagnosis not present

## 2018-03-28 DIAGNOSIS — E875 Hyperkalemia: Secondary | ICD-10-CM | POA: Diagnosis not present

## 2018-03-28 DIAGNOSIS — M1990 Unspecified osteoarthritis, unspecified site: Secondary | ICD-10-CM | POA: Diagnosis not present

## 2018-03-28 DIAGNOSIS — R569 Unspecified convulsions: Secondary | ICD-10-CM | POA: Diagnosis not present

## 2018-03-29 DIAGNOSIS — L400 Psoriasis vulgaris: Secondary | ICD-10-CM | POA: Diagnosis not present

## 2018-03-29 DIAGNOSIS — E875 Hyperkalemia: Secondary | ICD-10-CM | POA: Diagnosis not present

## 2018-03-29 DIAGNOSIS — M1990 Unspecified osteoarthritis, unspecified site: Secondary | ICD-10-CM | POA: Diagnosis not present

## 2018-03-29 DIAGNOSIS — R569 Unspecified convulsions: Secondary | ICD-10-CM | POA: Diagnosis not present

## 2018-03-30 DIAGNOSIS — J069 Acute upper respiratory infection, unspecified: Secondary | ICD-10-CM | POA: Diagnosis not present

## 2018-03-30 DIAGNOSIS — J309 Allergic rhinitis, unspecified: Secondary | ICD-10-CM | POA: Diagnosis not present

## 2018-03-30 DIAGNOSIS — F17200 Nicotine dependence, unspecified, uncomplicated: Secondary | ICD-10-CM | POA: Diagnosis not present

## 2018-04-03 DIAGNOSIS — R05 Cough: Secondary | ICD-10-CM | POA: Diagnosis not present

## 2018-04-03 DIAGNOSIS — J449 Chronic obstructive pulmonary disease, unspecified: Secondary | ICD-10-CM | POA: Diagnosis not present

## 2018-04-03 DIAGNOSIS — F17200 Nicotine dependence, unspecified, uncomplicated: Secondary | ICD-10-CM | POA: Diagnosis not present

## 2018-05-29 ENCOUNTER — Encounter: Payer: Self-pay | Admitting: Family Medicine

## 2018-05-29 ENCOUNTER — Ambulatory Visit: Payer: Medicare Other | Attending: Family Medicine | Admitting: Family Medicine

## 2018-05-29 VITALS — BP 108/68 | HR 73 | Temp 98.0°F | Ht 66.0 in | Wt 136.0 lb

## 2018-05-29 DIAGNOSIS — L299 Pruritus, unspecified: Secondary | ICD-10-CM | POA: Diagnosis not present

## 2018-05-29 DIAGNOSIS — F331 Major depressive disorder, recurrent, moderate: Secondary | ICD-10-CM | POA: Diagnosis not present

## 2018-05-29 DIAGNOSIS — Z8719 Personal history of other diseases of the digestive system: Secondary | ICD-10-CM | POA: Diagnosis not present

## 2018-05-29 DIAGNOSIS — N898 Other specified noninflammatory disorders of vagina: Secondary | ICD-10-CM | POA: Diagnosis not present

## 2018-05-29 DIAGNOSIS — Z79899 Other long term (current) drug therapy: Secondary | ICD-10-CM | POA: Diagnosis not present

## 2018-05-29 DIAGNOSIS — E782 Mixed hyperlipidemia: Secondary | ICD-10-CM | POA: Insufficient documentation

## 2018-05-29 DIAGNOSIS — E2839 Other primary ovarian failure: Secondary | ICD-10-CM | POA: Diagnosis not present

## 2018-05-29 DIAGNOSIS — F1410 Cocaine abuse, uncomplicated: Secondary | ICD-10-CM | POA: Diagnosis not present

## 2018-05-29 DIAGNOSIS — M25551 Pain in right hip: Secondary | ICD-10-CM | POA: Diagnosis not present

## 2018-05-29 DIAGNOSIS — K219 Gastro-esophageal reflux disease without esophagitis: Secondary | ICD-10-CM | POA: Diagnosis not present

## 2018-05-29 DIAGNOSIS — F419 Anxiety disorder, unspecified: Secondary | ICD-10-CM | POA: Diagnosis not present

## 2018-05-29 DIAGNOSIS — R569 Unspecified convulsions: Secondary | ICD-10-CM | POA: Insufficient documentation

## 2018-05-29 MED ORDER — CYCLOBENZAPRINE HCL 10 MG PO TABS: 10.0000 mg | ORAL_TABLET | Freq: Every day | ORAL | 0 refills | Status: DC

## 2018-05-29 MED ORDER — SIMVASTATIN 40 MG PO TABS: 40.0000 mg | ORAL_TABLET | Freq: Every evening | ORAL | 6 refills | Status: DC

## 2018-05-29 MED ORDER — DICLOFENAC SODIUM 75 MG PO TBEC: 75.0000 mg | DELAYED_RELEASE_TABLET | Freq: Two times a day (BID) | ORAL | 3 refills | Status: DC

## 2018-05-29 MED ORDER — HYDROXYZINE HCL 25 MG PO TABS: 25.0000 mg | ORAL_TABLET | Freq: Three times a day (TID) | ORAL | 0 refills | Status: DC | PRN

## 2018-05-29 NOTE — Progress Notes (Signed)
Subjective:  Patient ID: [name], female    DOB: [birth year]  Age: 68 y.o. MRN: [id number]  CC: Hip Pain   HPI [name] is a 68 year old female with a history of seizures (managed by neurology), depression (managed by [facility]), hyperlipidemia here for follow-up visit. Last office visit was in 02/2017. She complains of relapsing with regards to cocaine use and was recently discharged from inpatient drug rehab last month but was never able to follow through with the support groups due to transportation issues.  She has been admitted to inpatient rehab 3 times but has never been successful with regard to quitting cocaine. She complains of right hip pain and is wondering if she can see an orthopedic as symptoms have been uncontrolled on ibuprofen.  Pain is severe, causes her to limp and does not radiate and is worse when she tries to bend over to wear her shoes.  While in inpatient rehab she received diclofenac which was effective.  She complains of generalized pruritus but denies presence of rash and symptoms are worse at certain times of the day.  Denies use of new soaps, creams or new allergies.  With regards to healthcare maintenance she states she is up-to-date on a colonoscopy which she underwent 2 years ago at [facility] and for Pap smear she would like to be referred to GYN as she informs me she has not in her genitalia and have them excised several years ago by GYN.  Past Medical History:  Diagnosis Date  . Acid reflux   . Anxiety   . Depression   . Gastric ulcer   . High cholesterol   . Interstitial cystitis   . Mood disorder ([place])   . MRSA (methicillin resistant staph aureus) culture positive   . Pneumonia 2017  . Seizures ([place])     Past Surgical History:  Procedure Laterality Date  . CATARACT EXTRACTION    . MULTIPLE TOOTH EXTRACTIONS    . SHOULDER ARTHROSCOPY      Family History  Adopted: Yes  Family history unknown: Yes    Allergies  Allergen Reactions    . Haloperidol Decanoate Anaphylaxis    Outpatient Medications Prior to Visit  Medication Sig Dispense Refill  . acetaminophen (TYLENOL) 500 MG tablet Take 1,000 mg by mouth 2 (two) times daily as needed for mild pain (PAIN).    Marland Kitchen ARIPiprazole (ABILIFY) 15 MG tablet Take 15 mg by mouth daily.     Marland Kitchen doxepin (SINEQUAN) 10 MG capsule Take 10 mg by mouth at bedtime.     Marland Kitchen FLUoxetine (PROZAC) 40 MG capsule Take 40 mg by mouth daily.     Marland Kitchen lamoTRIgine (LAMICTAL) 100 MG tablet Take 1 tablet (100 mg total) by mouth every morning AND 2 tablets (200 mg total) every evening. 90 tablet 12  . mirtazapine (REMERON) 45 MG tablet Take 45 mg by mouth at bedtime.   0  . Multiple Vitamins-Calcium (ONE-A-DAY WOMENS PO) Take 1 tablet by mouth daily.    Marland Kitchen ibuprofen (ADVIL,MOTRIN) 600 MG tablet Take 1 tablet (600 mg total) by mouth every 8 (eight) hours as needed. 60 tablet 1  . simvastatin (ZOCOR) 40 MG tablet Take 1 tablet (40 mg total) by mouth every evening. 30 tablet 3  . cloNIDine (CATAPRES) 0.1 MG tablet TAKE 1 TABLET BY MOUTH EVERY NIGHT AT BEDTIME FOR HOT FLASHES (Patient not taking: Reported on 05/29/2018) 30 tablet 3  . traZODone (DESYREL) 150 MG tablet Take 300 mg by mouth  at bedtime.      No facility-administered medications prior to visit.      ROS Review of Systems  Constitutional: Negative for activity change, appetite change and fatigue.  HENT: Negative for congestion, sinus pressure and sore throat.   Eyes: Negative for visual disturbance.  Respiratory: Negative for cough, chest tightness, shortness of breath and wheezing.   Cardiovascular: Negative for chest pain and palpitations.  Gastrointestinal: Negative for abdominal distention, abdominal pain and constipation.  Endocrine: Negative for polydipsia.  Genitourinary: Negative for dysuria and frequency.  Musculoskeletal:       See hpi  Skin:       See hpi  Neurological: Negative for tremors, light-headedness and numbness.   Hematological: Does not bruise/bleed easily.  Psychiatric/Behavioral: Negative for agitation and behavioral problems.    Objective:  BP 108/68   Pulse 73   Temp 98 F (36.7 C) (Oral)   Ht 5' 6"  (1.676 m)   Wt 136 lb (61.7 kg)   SpO2 97%   BMI 21.95 kg/m   BP/Weight 05/29/2018 05/23/2351 09/16/4429  Systolic BP 540 086 761  Diastolic BP 68 74 72  Wt. (Lbs) 136 135 130  BMI 21.95 21.79 20.98      Physical Exam Constitutional:      Appearance: She is well-developed.  Cardiovascular:     Rate and Rhythm: Normal rate.     Heart sounds: Normal heart sounds. No murmur.  Pulmonary:     Effort: Pulmonary effort is normal.     Breath sounds: Normal breath sounds. No wheezing or rales.  Chest:     Chest wall: No tenderness.  Abdominal:     General: Bowel sounds are normal. There is no distension.     Palpations: Abdomen is soft. There is no mass.     Tenderness: There is no abdominal tenderness.  Musculoskeletal:     Comments: Abnormal gait Tenderness on palpation of posterior aspect of right hip and on range of motion  Skin:    General: Skin is warm and dry.  Neurological:     Mental Status: She is alert and oriented to person, place, and time.  Psychiatric:        Mood and Affect: Mood normal.        Behavior: Behavior normal.     CMP Latest Ref Rng & Units 04/29/2017 03/15/2017 01/22/2015  Glucose 65 - 99 mg/dL 116(H) 93 64(L)  BUN 6 - 20 mg/dL 20 18 12   Creatinine 0.44 - 1.00 mg/dL 0.87 0.93 0.88  Sodium 135 - 145 mmol/L 139 137 140  Potassium 3.5 - 5.1 mmol/L 3.4(L) 4.9 4.4  Chloride 101 - 111 mmol/L 107 101 104  CO2 22 - 32 mmol/L 27 22 23   Calcium 8.9 - 10.3 mg/dL 8.9 10.2 9.1  Total Protein 6.0 - 8.5 g/dL - 7.5 6.3  Total Bilirubin 0.0 - 1.2 mg/dL - 0.3 <0.2  Alkaline Phos 39 - 117 IU/L - 80 74  AST 0 - 40 IU/L - 25 10  ALT 0 - 32 IU/L - 32 5    Lipid Panel     Component Value Date/Time   CHOL 227 (H) 03/15/2017 0929   TRIG 257 (H) 03/15/2017 0929    HDL 52 03/15/2017 0929   CHOLHDL 4.4 03/15/2017 0929   CHOLHDL 2.6 Ratio 01/12/2007 2140   VLDL 31 01/12/2007 2140   LDLCALC 124 (H) 03/15/2017 0929   LDLDIRECT 160 05/29/2006    CBC    Component Value Date/Time  WBC 7.5 04/29/2017 0040   RBC 4.08 04/29/2017 0040   HGB 13.2 04/29/2017 0040   HGB 17.0 (H) 03/15/2017 0929   HCT 39.0 04/29/2017 0040   HCT 49.0 (H) 03/15/2017 0929   PLT 306 04/29/2017 0040   PLT 279 03/15/2017 0929   MCV 95.6 04/29/2017 0040   MCV 94 03/15/2017 0929   MCH 32.4 04/29/2017 0040   MCHC 33.8 04/29/2017 0040   RDW 13.2 04/29/2017 0040   RDW 14.1 03/15/2017 0929   LYMPHSABS 2.6 04/29/2017 0040   LYMPHSABS 3.4 (H) 03/15/2017 0929   MONOABS 0.7 04/29/2017 0040   EOSABS 0.3 04/29/2017 0040   EOSABS 0.4 03/15/2017 0929   BASOSABS 0.1 04/29/2017 0040   BASOSABS 0.1 03/15/2017 0929      Assessment & Plan:   1. HYPERLIPIDEMIA, MIXED If lipid panel is uncontrolled I will make no regimen changes since she has not been compliant with her statin Low-cholesterol diet - simvastatin (ZOCOR) 40 MG tablet; Take 1 tablet (40 mg total) by mouth every evening.  Dispense: 30 tablet; Refill: 6 - CMP14+EGFR; Future - Lipid panel; Future  2. Right hip pain We will obtain x-ray first and reassess at next visit at which time we will decide if orthopedic referral is indicated - diclofenac (VOLTAREN) 75 MG EC tablet; Take 1 tablet (75 mg total) by mouth 2 (two) times daily.  Dispense: 30 tablet; Refill: 3 - DG Pelvis 1-2 Views; Future - cyclobenzaprine (FLEXERIL) 10 MG tablet; Take 1 tablet (10 mg total) by mouth at bedtime.  Dispense: 30 tablet; Refill: 0  3. Cocaine abuse ([place]) I will have the LCSW reach out to her with regards to resources to help quit - Ambulatory referral to Social Work  4. Pruritus No rash noted on exam - hydrOXYzine (ATARAX/VISTARIL) 25 MG tablet; Take 1 tablet (25 mg total) by mouth 3 (three) times daily as needed.  Dispense: 30 tablet;  Refill: 0  5. Moderate episode of recurrent major depressive disorder (HCC) Currently [facility] health at [facility]  6. Estrogen deficiency - DG Bone Density; Future  7. Vaginal lesion She would rather have her Pap smear with GYN and also due to presence of vaginal lesions - Ambulatory referral to Gynecology   Meds ordered this encounter  Medications  . simvastatin (ZOCOR) 40 MG tablet    Sig: Take 1 tablet (40 mg total) by mouth every evening.    Dispense:  30 tablet    Refill:  6  . diclofenac (VOLTAREN) 75 MG EC tablet    Sig: Take 1 tablet (75 mg total) by mouth 2 (two) times daily.    Dispense:  30 tablet    Refill:  3  . hydrOXYzine (ATARAX/VISTARIL) 25 MG tablet    Sig: Take 1 tablet (25 mg total) by mouth 3 (three) times daily as needed.    Dispense:  30 tablet    Refill:  0  . cyclobenzaprine (FLEXERIL) 10 MG tablet    Sig: Take 1 tablet (10 mg total) by mouth at bedtime.    Dispense:  30 tablet    Refill:  0    Follow-up: Return in about 1 month (around 06/27/2018) for Follow-up on right hip pain.       Charlott Rakes, MD, FAAFP. [facility] [place], [place]   05/29/2018, 4:13 PM

## 2018-06-01 ENCOUNTER — Telehealth: Payer: Self-pay | Admitting: Neurology

## 2018-06-01 NOTE — Telephone Encounter (Signed)
LVM # 2 advising patient that the Rx was sent to [facility] by Dr Krista Blue. Advised she call and talk to dr on call for any problems, left office number.

## 2018-06-01 NOTE — Telephone Encounter (Signed)
LVM requesting patient call back by noon today to let this RN know if she picked up lamotrigine Rx sent in by Dr Krista Blue last night.

## 2018-06-01 NOTE — Telephone Encounter (Signed)
Late entry:  Patient called on call for only three pills of lamotrigine left.  I called in Lamotrigine 100mg  1 in the morning and 2 tabs at night xone months supple with 3 refills to [facility]  Please check with her, make sure that she has enough antiepileptic medication

## 2018-06-05 DIAGNOSIS — F319 Bipolar disorder, unspecified: Secondary | ICD-10-CM | POA: Diagnosis not present

## 2018-06-13 ENCOUNTER — Other Ambulatory Visit: Payer: Self-pay | Admitting: Family Medicine

## 2018-06-13 ENCOUNTER — Ambulatory Visit (HOSPITAL_COMMUNITY)
Admission: RE | Admit: 2018-06-13 | Discharge: 2018-06-13 | Disposition: A | Discharge status: Home / Self Care | Payer: Medicare Other | Source: Ambulatory Visit | Attending: Family Medicine | Admitting: Family Medicine

## 2018-06-13 DIAGNOSIS — M25551 Pain in right hip: Secondary | ICD-10-CM

## 2018-06-13 DIAGNOSIS — M1611 Unilateral primary osteoarthritis, right hip: Secondary | ICD-10-CM | POA: Diagnosis not present

## 2018-06-14 ENCOUNTER — Other Ambulatory Visit: Payer: Self-pay | Admitting: Family Medicine

## 2018-06-14 DIAGNOSIS — M25551 Pain in right hip: Secondary | ICD-10-CM

## 2018-06-22 ENCOUNTER — Telehealth: Payer: Self-pay

## 2018-06-22 ENCOUNTER — Telehealth: Payer: Self-pay | Admitting: Licensed Clinical Social Worker

## 2018-06-22 NOTE — Telephone Encounter (Signed)
Patient was called and informed of lab results. 

## 2018-06-22 NOTE — Telephone Encounter (Signed)
-----   Message from Charlott Rakes, MD sent at 06/14/2018 12:29 PM EST ----- X-ray of the hip revealed advanced right hip arthritis and I have referred her to orthopedics.

## 2018-06-22 NOTE — Telephone Encounter (Signed)
LCSWA attempted to contact pt to follow up on behavioral health referral from PCP. LCSWA left message for a return call.  

## 2018-06-26 ENCOUNTER — Ambulatory Visit (INDEPENDENT_AMBULATORY_CARE_PROVIDER_SITE_OTHER): Payer: Medicare Other | Admitting: Orthopaedic Surgery

## 2018-06-26 VITALS — Ht 66.0 in | Wt 137.0 lb

## 2018-06-26 DIAGNOSIS — M1611 Unilateral primary osteoarthritis, right hip: Secondary | ICD-10-CM | POA: Diagnosis not present

## 2018-06-26 MED ORDER — TRAMADOL HCL 50 MG PO TABS: 50.0000 mg | ORAL_TABLET | Freq: Every day | ORAL | 2 refills | Status: DC | PRN

## 2018-06-26 NOTE — Progress Notes (Signed)
Office Visit Note   Patient: [name]           Date of Birth: [birth year]           MRN: [id number] Visit Date: 06/26/2018              Requested by: Charlott Rakes, MD [place], [place] [address] PCP: Charlott Rakes, MD   Assessment & Plan: Visit Diagnoses:  1. 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  Patient has been medically stable for the last few years.  She is currently on disability and wishes to have a hip replacement after we had a long discussion about conservative treatment.  She is not interested in cortisone injections.  Her hip degenerative joint disease is severe which will require hip replacement for any meaningful pain relief.  In the meantime we will prescribe her tramadol.  We will schedule her surgery in the near future.  Information on hip replacement surgery was provided today.  Follow-Up Instructions: Return for 2 week postop visit.   Orders:  No orders of the defined types were placed in this encounter.  Meds ordered this encounter  Medications  . traMADol (ULTRAM) 50 MG tablet    Sig: Take 1-2 tablets (50-100 mg total) by mouth daily as needed.    Dispense:  30 tablet    Refill:  2      Procedures: No procedures performed   Clinical Data: No additional findings.   Subjective: Chief Complaint  Patient presents with  . Right Hip - Pain    [name] is a 68 year old female who comes in with a year and a half of worsening right hip pain that severely limits her ability to be active and perform ADLs.  She denies any chronic back pain or radicular symptoms.  She takes oral diclofenac which has helped.  She also uses topical medication to help with the symptoms.  She was told recently by her PCP that she has advanced arthritis.  She is not interested in cortisone injections if they are not going to be a permanent solution.  She has been  seizure-free for 4 years.  She does smoke a pack of cigarettes a day.  She is currently on disability.  She has a previous history of alcohol abuse.   Review of Systems  Constitutional: Negative.   HENT: Negative.   Eyes: Negative.   Respiratory: Negative.   Cardiovascular: Negative.   Endocrine: Negative.   Musculoskeletal: Negative.   Neurological: Negative.   Hematological: Negative.   Psychiatric/Behavioral: Negative.   All other systems reviewed and are negative.    Objective: Vital Signs: Ht 5\' 6"  (1.676 m)   Wt 137 lb (62.1 kg)   BMI 22.11 kg/m   Physical Exam Vitals signs and nursing note reviewed.  Constitutional:      Appearance: She is well-developed.  HENT:     Head: Normocephalic and atraumatic.  Neck:     Musculoskeletal: Neck supple.  Pulmonary:     Effort: Pulmonary effort is normal.  Abdominal:     Palpations: Abdomen is soft.  Skin:    General: Skin is warm.     Capillary Refill: Capillary refill takes less than 2 seconds.  Neurological:     Mental Status: She is alert and oriented to person, place, and time.  Psychiatric:        Behavior: Behavior normal.  Thought Content: Thought content normal.        Judgment: Judgment normal.     Ortho Exam Right hip exam shows positive logroll and severe pain with internal and external rotation.  Unable to perform Stinchfield sign due to pain. Specialty Comments:  No specialty comments available.  Imaging: No results found.   PMFS History: Patient Active Problem List   Diagnosis Date Noted  . Chronic sinusitis 06/10/2016  . Headache(784.0) 11/18/2013  . Seizure disorder ([place]) 11/18/2013  . 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 Cocaine abuse ([place]) 02/09/2013  . Lethargy 02/09/2013  . SEBORRHEIC KERATOSIS 04/03/2007  . EPIDERMOID CYST, BACK 04/03/2007  . BREAST TENDERNESS 02/09/2007  . TOBACCO ABUSE 12/15/2006  . ALLERGIC RHINITIS, SEASONAL 12/15/2006  . GERD 12/15/2006  .  DIVERTICULOSIS, COLON 12/15/2006  . [place] DISEASE, LUMBOSACRAL SPINE 12/15/2006  . ALCOHOL ABUSE, HX OF 12/15/2006  . 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 BARRETT'S ESOPHAGUS, HX OF 12/15/2006  . HYPERLIPIDEMIA, MIXED 12/13/2006  . Depression 12/13/2006  . INTERSTITIAL CYSTITIS 12/13/2006  . 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 Acid reflux   . Anxiety   . Depression   . Gastric ulcer   . High cholesterol   . Interstitial cystitis   . Mood disorder ([place])   . MRSA (methicillin resistant staph aureus) culture positive   . Pneumonia 2017  . Seizures ([place])     Family History  Adopted: Yes  Family history unknown: Yes    Past Surgical History:  Procedure Laterality Date  . CATARACT EXTRACTION    . MULTIPLE TOOTH EXTRACTIONS    . SHOULDER ARTHROSCOPY     Social History   Occupational History  . Occupation: disabled   Tobacco Use  . Smoking status: Current Every Day Smoker    Packs/day: 1.00    Years: 40.00    Pack years: 40.00    Types: Cigarettes  . Smokeless tobacco: Never Used  Substance and Sexual Activity  . Alcohol use: No  . Drug use: No  . Sexual activity: Never

## 2018-06-28 NOTE — Care Plan (Addendum)
[name] met with patient in office for an initial pre-op conversation regarding Ortho Bundle program. Client is willing to participate in bundle. She is currently scheduled for R-Ant THA on 07/09/18. She reports she will have limited help at home. Has a neighbor who can stop by and help as needed, but will not be staying with her. Will need a front wheeled walker ordered. [name] will order this through [place]. Also will go ahead and set up home health PT with [facility] for 5 visits prior to Post-op appointment scheduled on 07/23/18. All questions answered. Pre-op Surveys completed. For questions, contact Jamse Arn, [place].

## 2018-07-02 ENCOUNTER — Other Ambulatory Visit: Payer: Self-pay | Admitting: Family Medicine

## 2018-07-02 ENCOUNTER — Encounter: Payer: Medicare Other | Admitting: Licensed Clinical Social Worker

## 2018-07-02 ENCOUNTER — Ambulatory Visit: Payer: Medicare Other | Admitting: Family Medicine

## 2018-07-02 DIAGNOSIS — L299 Pruritus, unspecified: Secondary | ICD-10-CM

## 2018-07-02 NOTE — Pre-Procedure Instructions (Signed)
LARAE CAISON  07/02/2018      [id number] - [place], [place] - [address] [place] [address] Phone: [phone] Fax: [place], [place] [address] [place] [address] Phone: [phone] Fax: [phone]  [facility] [place], [place] AT [address] [address] Phone: [phone] Fax: [phone]    Your procedure is scheduled on  March 23rd, 2020.  Report to [facility] Admitting at 05:30 A.M.  Call this number if you have problems the morning of surgery:  [phone]   Remember:  Do not eat or drink after midnight. This includes gum and hard candy.    Take these medicines the morning of surgery with A SIP OF WATER: Abilify, Prozac, Lamictal, and you may take if needed your tramadol, tylenol  and hydroxine.    Do not wear jewelry, make-up or nail polish.  Do not wear lotions, powders, or perfumes, or deodorant.  Do not shave 48 hours prior to surgery.  Men may shave face and neck.  Do not bring valuables to the hospital.  [facility] is not responsible for any belongings or valuables.  Contacts, dentures or bridgework may not be worn into surgery.  Leave your suitcase in the car.  After surgery it may be brought to your room.  For patients admitted to the hospital, discharge time will be determined by your treatment team.  Patients discharged the day of surgery will not be allowed to drive home.    [facility]- Preparing For Surgery  Before surgery, you can play an important role. Because skin is not sterile, your skin needs to be as free of germs as possible. You can reduce the number of germs on your skin by washing with CHG (chlorahexidine gluconate) Soap before surgery.  CHG is an antiseptic cleaner which kills germs and  bonds with the skin to continue killing germs even after washing.    Oral Hygiene is also important to reduce your risk of infection.  Remember - BRUSH YOUR TEETH THE MORNING OF SURGERY WITH YOUR REGULAR TOOTHPASTE  Please do not use if you have an allergy to CHG or antibacterial soaps. If your skin becomes reddened/irritated stop using the CHG.  Do not shave (including legs and underarms) for at least 48 hours prior to first CHG shower. It is OK to shave your face.  Please follow these instructions carefully.   1. Shower the NIGHT BEFORE SURGERY and the MORNING OF SURGERY with CHG.   2. If you chose to wash your hair, wash your hair first as usual with your normal shampoo.  3. After you shampoo, rinse your hair and body thoroughly to remove the shampoo.  4. Use CHG as you would any other liquid soap. You can apply CHG directly to the skin and wash gently with a scrungie or a clean washcloth.   5. Apply the CHG Soap to your body ONLY FROM THE NECK DOWN.  Do not use on open wounds or open sores. Avoid contact with your eyes, ears, mouth and genitals (private parts). Wash Face and genitals (private parts)  with your normal soap.  6. Wash thoroughly, paying special attention to the area where your surgery will be performed.  7. Thoroughly rinse your body with warm water from the  neck down.  8. DO NOT shower/wash with your normal soap after using and rinsing off the CHG Soap.  9. Pat yourself dry with a CLEAN TOWEL.  10. Wear CLEAN PAJAMAS to bed the night before surgery, wear comfortable clothes the morning of surgery  11. Place CLEAN SHEETS on your bed the night of your first shower and DO NOT SLEEP WITH PETS.    Day of Surgery:  Do not apply any deodorants/lotions.  Please wear clean clothes to the hospital/surgery center.   Remember to brush your teeth WITH YOUR REGULAR TOOTHPASTE.

## 2018-07-03 ENCOUNTER — Encounter (HOSPITAL_COMMUNITY)
Admission: RE | Admit: 2018-07-03 | Discharge: 2018-07-03 | Disposition: A | Discharge status: Home / Self Care | Payer: Medicare Other | Source: Ambulatory Visit | Attending: Orthopaedic Surgery | Admitting: Orthopaedic Surgery

## 2018-07-03 ENCOUNTER — Other Ambulatory Visit: Payer: Self-pay

## 2018-07-03 ENCOUNTER — Encounter (HOSPITAL_COMMUNITY): Payer: Self-pay

## 2018-07-03 DIAGNOSIS — M1611 Unilateral primary osteoarthritis, right hip: Secondary | ICD-10-CM | POA: Diagnosis not present

## 2018-07-03 DIAGNOSIS — Z01818 Encounter for other preprocedural examination: Secondary | ICD-10-CM | POA: Diagnosis present

## 2018-07-03 LAB — COMPREHENSIVE METABOLIC PANEL WITH GFR
ALT: 16 U/L (ref 0–44)
AST: 18 U/L (ref 15–41)
Albumin: 3.9 g/dL (ref 3.5–5.0)
Alkaline Phosphatase: 88 U/L (ref 38–126)
Anion gap: 9 (ref 5–15)
BUN: 17 mg/dL (ref 8–23)
CO2: 20 mmol/L — ABNORMAL LOW (ref 22–32)
Calcium: 9.1 mg/dL (ref 8.9–10.3)
Chloride: 108 mmol/L (ref 98–111)
Creatinine, Ser: 0.9 mg/dL (ref 0.44–1.00)
GFR calc Af Amer: >60 mL/min
GFR calc non Af Amer: >60 mL/min
Glucose, Bld: 85 mg/dL (ref 70–99)
Potassium: 4.9 mmol/L (ref 3.5–5.1)
Sodium: 137 mmol/L (ref 135–145)
Total Bilirubin: 0.4 mg/dL (ref 0.3–1.2)
Total Protein: 6.9 g/dL (ref 6.5–8.1)

## 2018-07-03 LAB — SURGICAL PCR SCREEN
MRSA, PCR: NEGATIVE
Staphylococcus aureus: NEGATIVE

## 2018-07-03 LAB — CBC WITH DIFFERENTIAL/PLATELET
Abs Immature Granulocytes: 0.04 10*3/uL (ref 0.00–0.07)
Basophils Absolute: 0.1 10*3/uL (ref 0.0–0.1)
Basophils Relative: 1 %
Eosinophils Absolute: 0.3 10*3/uL (ref 0.0–0.5)
Eosinophils Relative: 3 %
HCT: 47.6 % — ABNORMAL HIGH (ref 36.0–46.0)
Hemoglobin: 15.4 g/dL — ABNORMAL HIGH (ref 12.0–15.0)
Immature Granulocytes: 0 %
Lymphocytes Relative: 27 %
Lymphs Abs: 2.7 10*3/uL (ref 0.7–4.0)
MCH: 31.5 pg (ref 26.0–34.0)
MCHC: 32.4 g/dL (ref 30.0–36.0)
MCV: 97.3 fL (ref 80.0–100.0)
Monocytes Absolute: 0.7 10*3/uL (ref 0.1–1.0)
Monocytes Relative: 7 %
Neutro Abs: 6.2 10*3/uL (ref 1.7–7.7)
Neutrophils Relative %: 62 %
Platelets: 288 10*3/uL (ref 150–400)
RBC: 4.89 MIL/uL (ref 3.87–5.11)
RDW: 13.9 % (ref 11.5–15.5)
WBC: 9.9 10*3/uL (ref 4.0–10.5)
nRBC: 0 % (ref 0.0–0.2)

## 2018-07-03 LAB — TYPE AND SCREEN
ABO/RH(D): A POS
Antibody Screen: NEGATIVE

## 2018-07-03 LAB — PROTIME-INR
INR: 1 (ref 0.8–1.2)
Prothrombin Time: 12.7 s (ref 11.4–15.2)

## 2018-07-03 LAB — APTT: aPTT: 49 s — ABNORMAL HIGH (ref 24–36)

## 2018-07-03 NOTE — Progress Notes (Signed)
PCP - Charlott Rakes Cardiologist - denies  Chest x-ray - N/A EKG - 07/03/18  DM - denies SA - denies  Anesthesia review: N/A  Patient denies shortness of breath, fever, and chest pain at PAT appointment  Pt stated she has a smokers cough.   Patient verbalized understanding of instructions that were given to them at the PAT appointment. Patient was also instructed that they will need to review over the PAT instructions again at home before surgery.

## 2018-07-03 NOTE — Progress Notes (Signed)
Aptt elevated and not on any anticoagulation

## 2018-07-04 ENCOUNTER — Telehealth (INDEPENDENT_AMBULATORY_CARE_PROVIDER_SITE_OTHER): Payer: Self-pay | Admitting: *Deleted

## 2018-07-04 LAB — ABO/RH: ABO/RH(D): A POS

## 2018-07-04 NOTE — Care Plan (Signed)
[name] contacted patient to inform that all elective surgeries have currently been canceled due to COVID-19, for 4 weeks. Will begin rescheduling once given information by [place] and the surgery scheduler will call her back to reschedule her elective R-Ant THA with Dr. Erlinda Hong.

## 2018-07-04 NOTE — Telephone Encounter (Signed)
Ortho bundle contact to inform of current surgery scheduling on hold at this time. Will update once scheduling for elective surgeries can restart.

## 2018-07-09 ENCOUNTER — Encounter (HOSPITAL_COMMUNITY): Admission: RE | Payer: Self-pay | Source: Home / Self Care

## 2018-07-09 ENCOUNTER — Ambulatory Visit (HOSPITAL_COMMUNITY): Admission: RE | Admit: 2018-07-09 | Payer: Medicare Other | Source: Home / Self Care | Admitting: Orthopaedic Surgery

## 2018-07-09 SURGERY — TOTAL HIP ARTHROPLASTY ANTERIOR APPROACH
Anesthesia: Spinal | Laterality: Right

## 2018-07-23 ENCOUNTER — Inpatient Hospital Stay (INDEPENDENT_AMBULATORY_CARE_PROVIDER_SITE_OTHER): Payer: Medicare Other | Admitting: Physician Assistant

## 2018-07-24 ENCOUNTER — Telehealth (INDEPENDENT_AMBULATORY_CARE_PROVIDER_SITE_OTHER): Payer: Self-pay | Admitting: Orthopaedic Surgery

## 2018-07-24 MED ORDER — HYDROCODONE-ACETAMINOPHEN 5-325 MG PO TABS: 1.0000 | ORAL_TABLET | Freq: Every day | ORAL | 0 refills | Status: DC | PRN

## 2018-07-24 NOTE — Telephone Encounter (Signed)
Will write one rx for norco to take once a day.  [facility] [address]

## 2018-07-24 NOTE — Telephone Encounter (Signed)
Called patient to advise. She is aware.

## 2018-07-24 NOTE — Telephone Encounter (Signed)
See message below °

## 2018-07-24 NOTE — Telephone Encounter (Signed)
Patient called stating that the Tramadol is not working for her and would like to get something stronger.  Patient would like for you to call her to advise.  CB#[phone].  Thank you.

## 2018-07-30 ENCOUNTER — Telehealth (INDEPENDENT_AMBULATORY_CARE_PROVIDER_SITE_OTHER): Payer: Self-pay | Admitting: Orthopaedic Surgery

## 2018-07-30 NOTE — Telephone Encounter (Signed)
Yes that's fine.  2 tabs daily prn #60

## 2018-07-30 NOTE — Telephone Encounter (Signed)
See message below °

## 2018-07-30 NOTE — Telephone Encounter (Signed)
Patient called wanting to know if Dr. Erlinda Hong would increase

## 2018-07-30 NOTE — Telephone Encounter (Signed)
[name], she said her Pharmacy, but I could not understand what name she said, left a message f/u on a refill request that was faxed over this morning. Her CB#[phone].  Thank you

## 2018-07-30 NOTE — Telephone Encounter (Signed)
Patient called wanting to know if Dr. Erlinda Hong would increase her dosage on her pain medication from just 1 pill per day to 2 pills daily.  CB#[phone].  Thank you.

## 2018-08-01 ENCOUNTER — Other Ambulatory Visit: Payer: Self-pay | Admitting: Family Medicine

## 2018-08-01 DIAGNOSIS — Z1231 Encounter for screening mammogram for malignant neoplasm of breast: Secondary | ICD-10-CM

## 2018-08-01 MED ORDER — TRAMADOL HCL 50 MG PO TABS: 100.0000 mg | ORAL_TABLET | Freq: Every day | ORAL | 2 refills | Status: DC | PRN

## 2018-08-01 NOTE — Telephone Encounter (Signed)
Would like these sent into her pharm. Thanks.

## 2018-08-02 ENCOUNTER — Telehealth (INDEPENDENT_AMBULATORY_CARE_PROVIDER_SITE_OTHER): Payer: Self-pay | Admitting: Orthopedic Surgery

## 2018-08-02 NOTE — Telephone Encounter (Signed)
[name] called in this morning to check on the status of her "increase in medication".  States she called Monday and has not heard anything since and is in a lot of pain and is having a hard time getting around.  Advised patient that office hours have been reduced to 8-12 and this may cause a delay in call back.

## 2018-08-02 NOTE — Telephone Encounter (Signed)
Dr Erlinda Hong sent in Rx into pharm yesterday.

## 2018-08-03 ENCOUNTER — Other Ambulatory Visit: Payer: Medicare Other

## 2018-08-07 ENCOUNTER — Other Ambulatory Visit: Payer: Self-pay | Admitting: Family Medicine

## 2018-08-07 DIAGNOSIS — L299 Pruritus, unspecified: Secondary | ICD-10-CM

## 2018-08-09 ENCOUNTER — Telehealth (INDEPENDENT_AMBULATORY_CARE_PROVIDER_SITE_OTHER): Payer: Self-pay

## 2018-08-09 MED ORDER — HYDROCODONE-ACETAMINOPHEN 5-325 MG PO TABS: 1.0000 | ORAL_TABLET | Freq: Every day | ORAL | 0 refills | Status: DC | PRN

## 2018-08-09 NOTE — Telephone Encounter (Signed)
Requesting refill of Hydrocodone.  Uses [facility].  Patient # [phone].

## 2018-08-09 NOTE — Telephone Encounter (Signed)
Please advise. If yes, please send Rx to [facility] [address]. Thank you.

## 2018-08-17 ENCOUNTER — Telehealth: Payer: Self-pay

## 2018-08-17 MED ORDER — HYDROCODONE-ACETAMINOPHEN 5-325 MG PO TABS: 1.0000 | ORAL_TABLET | Freq: Three times a day (TID) | ORAL | 0 refills | Status: DC | PRN

## 2018-08-17 NOTE — Telephone Encounter (Signed)
Patient would like Rx (hydrocodone)  [facility].  CB: [phone]

## 2018-08-20 ENCOUNTER — Telehealth: Payer: Self-pay | Admitting: *Deleted

## 2018-08-20 NOTE — Care Plan (Signed)
[name] received call and voice message left by Ortho Bundle patient over the weekend. She states she was contacted regarding rescheduling of her R- hip replacement with Dr. Erlinda Hong. She indicated that she would need to be scheduled for surgery later in the day on 08/27/2018 and also requested an appointment for 3 days prior to surgery for pre-op. [name] returned call today and explained that the schedulers have not been able to place patients on the surgery schedule at this time, but because Dr. Erlinda Hong has established that he would like for her to be done soon, she will be contacted as soon as scheduling is able to assign her date/time. Also noted that the hospital pre-op will be the ones that call her to schedule her pre-op labs/testing once her surgery is officially scheduled. Patient understands that someone will be in contact regarding scheduling her hip replacement surgery soon. Original surgery date on 07/09/18, but canceled due to COVID-19.

## 2018-08-20 NOTE — Telephone Encounter (Signed)
Ortho bundle contact call.

## 2018-08-21 ENCOUNTER — Other Ambulatory Visit: Payer: Self-pay | Admitting: Physician Assistant

## 2018-08-22 ENCOUNTER — Encounter (HOSPITAL_COMMUNITY): Payer: Self-pay

## 2018-08-22 NOTE — Patient Instructions (Addendum)
[name]  08/23/2018      Your procedure is scheduled on:  08-27-2018   Report to [facility] Main  Entrance,  Report to admitting at  9:45 AM    Call this number if you have problems the morning of surgery [phone]    AFTER YOUR PAT APPOINTMENT YOU ARE TO GO TO DRIVE UP TO [place]. TO GET A COVID 19 TEST DONE TODAY BETWEEN 11:00 AM TO 3:00 PM.  UNTIL YOUR SURGERY,  AFTER THIS TEST IS DONE,  YOU ARE TO GO STRAIGHT HOME. YOU ARE NOT ALLOWED TO GO ANYWHERE UNLESS TO MEDICAL APPOINTMENT OR EMERGENCY.  ALSO NO VISITORS ALLOWED AT [facility].     Remember: NO SOLID FOOD AFTER MIDNIGHT THE NIGHT PRIOR TO SURGERY. NOTHING BY MOUTH EXCEPT CLEAR LIQUIDS UNTIL  4:30 AM TO SCHEDULED SURGERY. PLEASE FINISH ENSURE DRINK PER SURGEON ORDER ,  WHICH NEEDS TO BE COMPLETED AT 4:30 AM.  NOTHING BY MOUTH AFTER 4:30 AM INCLUDING WATER, CANDY, GUM, MINTS.  BRUSH YOUR TEETH MORNING OF SURGERY AND RINSE YOUR MOUTH OUT, NO CHEWING GUM CANDY OR MINTS.       Take these medicines the morning of surgery with A SIP OF WATER:   Fluoxetine (prozac),  Lamotrigine (lamictal), Hydroxyzine if needed   NO SMOKING 24 HOURS PRIOR TO SURGERY                                You may not have any metal on your body including hair pins and piercings              Do not wear jewelry, make-up, lotions, powders or perfumes, deodorant              Do not wear nail polish.  Do not shave  48 hours prior to surgery.        Do not bring valuables to the hospital. [place].  Contacts, dentures or bridgework may not be worn into surgery.      _____________________________________________________________________     CLEAR LIQUID DIET   Foods Allowed                                                                     Foods Excluded  Coffee and tea, regular and decaf                              liquids that you cannot  Plain Jell-O in any flavor                                             see through such as:  Fruit ices (not with fruit pulp)  NO milk/ cream products, soups, orange juice  Iced Popsicles                                    All solid food Carbonated beverages, regular and diet                                    Cranberry, grape and apple juices Sports drinks like Gatorade Lightly seasoned clear broth or consume(fat free) Sugar, honey syrup            _____________________________________________________________________   [facility] - Preparing for Surgery Before surgery, you can play an important role.  Because skin is not sterile, your skin needs to be as free of germs as possible.  You can reduce the number of germs on your skin by washing with CHG (chlorahexidine gluconate) soap before surgery.  CHG is an antiseptic cleaner which kills germs and bonds with the skin to continue killing germs even after washing. Please DO NOT use if you have an allergy to CHG or antibacterial soaps.  If your skin becomes reddened/irritated stop using the CHG and inform your nurse when you arrive at Short Stay. Do not shave (including legs and underarms) for at least 48 hours prior to the first CHG shower.  You may shave your face/neck. Please follow these instructions carefully:  1.  Shower with CHG Soap the night before surgery and the  morning of Surgery.  2.  If you choose to wash your hair, wash your hair first as usual with your  normal  shampoo.  3.  After you shampoo, rinse your hair and body thoroughly to remove the  shampoo.                            4.  Use CHG as you would any other liquid soap.  You can apply chg directly  to the skin and wash                       Gently with a scrungie or clean washcloth.  5.  Apply the CHG Soap to your body ONLY FROM THE NECK DOWN.   Do not use on face/ open                            Wound or open sores. Avoid contact with eyes, ears mouth and genitals (private parts).                       Wash face,  Genitals (private parts) with your normal soap.             6.  Wash thoroughly, paying special attention to the area where your surgery  will be performed.  7.  Thoroughly rinse your body with warm water from the neck down.  8.  DO NOT shower/wash with your normal soap after using and rinsing off  the CHG Soap.             9.  Pat yourself dry with a clean towel.            10.  Wear clean pajamas.            11.  Place  clean sheets on your bed the night of your first shower and do not  sleep with pets. Day of Surgery : Do not apply any lotions/deodorants the morning of surgery.  Please wear clean clothes to the hospital/surgery center.  FAILURE TO FOLLOW THESE INSTRUCTIONS MAY RESULT IN THE CANCELLATION OF YOUR SURGERY PATIENT SIGNATURE_________________________________  NURSE SIGNATURE__________________________________  ________________________________________________________________________      [name]  An incentive spirometer is a tool that can help keep your lungs clear and active. This tool measures how well you are filling your lungs with each breath. Taking long deep breaths may help reverse or decrease the chance of developing breathing (pulmonary) problems (especially infection) following:  A long period of time when you are unable to move or be active. BEFORE THE PROCEDURE   If the spirometer includes an indicator to show your best effort, your nurse or respiratory therapist will set it to a desired goal.  If possible, sit up straight or lean slightly forward. Try not to slouch.  Hold the incentive spirometer in an upright position. INSTRUCTIONS FOR USE  1. Sit on the edge of your bed if possible, or sit up as far as you can in bed or on a chair. 2. Hold the incentive spirometer in an upright position. 3. Breathe out  normally. 4. Place the mouthpiece in your mouth and seal your lips tightly around it. 5. Breathe in slowly and as deeply as possible, raising the piston or the ball toward the top of the column. 6. Hold your breath for 3-5 seconds or for as long as possible. Allow the piston or ball to fall to the bottom of the column. 7. Remove the mouthpiece from your mouth and breathe out normally. 8. Rest for a few seconds and repeat Steps 1 through 7 at least 10 times every 1-2 hours when you are awake. Take your time and take a few normal breaths between deep breaths. 9. The spirometer may include an indicator to show your best effort. Use the indicator as a goal to work toward during each repetition. 10. After each set of 10 deep breaths, practice coughing to be sure your lungs are clear. If you have an incision (the cut made at the time of surgery), support your incision when coughing by placing a pillow or rolled up towels firmly against it. Once you are able to get out of bed, walk around indoors and cough well. You may stop using the incentive spirometer when instructed by your caregiver.  RISKS AND COMPLICATIONS  Take your time so you do not get dizzy or light-headed.  If you are in pain, you may need to take or ask for pain medication before doing incentive spirometry. It is harder to take a deep breath if you are having pain. AFTER USE  Rest and breathe slowly and easily.  It can be helpful to keep track of a log of your progress. Your caregiver can provide you with a simple table to help with this. If you are using the spirometer at home, follow these instructions: [place] IF:   You are having difficultly using the spirometer.  You have trouble using the spirometer as often as instructed.  Your pain medication is not giving enough relief while using the spirometer.  You develop fever of 100.5 F (38.1 C) or higher. SEEK IMMEDIATE MEDICAL CARE IF:   You cough up bloody sputum  that had not been present before.  You develop fever of 102 F (38.9 C)  or greater.  You develop worsening pain at or near the incision site. MAKE SURE YOU:   Understand these instructions.  Will watch your condition.  Will get help right away if you are not doing well or get worse. Document Released: 08/15/2006 Document Revised: 06/27/2011 Document Reviewed: 10/16/2006 ExitCare Patient Information 2014 ExitCare, [place].   ________________________________________________________________________  WHAT IS A BLOOD TRANSFUSION? Blood Transfusion Information  A transfusion is the replacement of blood or some of its parts. Blood is made up of multiple cells which provide different functions.  Red blood cells carry oxygen and are used for blood loss replacement.  White blood cells fight against infection.  Platelets control bleeding.  Plasma helps clot blood.  Other blood products are available for specialized needs, such as hemophilia or other clotting disorders. BEFORE THE TRANSFUSION  Who gives blood for transfusions?   Healthy volunteers who are fully evaluated to make sure their blood is safe. This is blood bank blood. Transfusion therapy is the safest it has ever been in the practice of medicine. Before blood is taken from a donor, a complete history is taken to make sure that person has no history of diseases nor engages in risky social behavior (examples are intravenous drug use or sexual activity with multiple partners). The donor's travel history is screened to minimize risk of transmitting infections, such as malaria. The donated blood is tested for signs of infectious diseases, such as HIV and hepatitis. The blood is then tested to be sure it is compatible with you in order to minimize the chance of a transfusion reaction. If you or a relative donates blood, this is often done in anticipation of surgery and is not appropriate for emergency situations. It takes many days to  process the donated blood. RISKS AND COMPLICATIONS Although transfusion therapy is very safe and saves many lives, the main dangers of transfusion include:   Getting an infectious disease.  Developing a transfusion reaction. This is an allergic reaction to something in the blood you were given. Every precaution is taken to prevent this. The decision to have a blood transfusion has been considered carefully by your caregiver before blood is given. Blood is not given unless the benefits outweigh the risks. AFTER THE TRANSFUSION  Right after receiving a blood transfusion, you will usually feel much better and more energetic. This is especially true if your red blood cells have gotten low (anemic). The transfusion raises the level of the red blood cells which carry oxygen, and this usually causes an energy increase.  The nurse administering the transfusion will monitor you carefully for complications. HOME CARE INSTRUCTIONS  No special instructions are needed after a transfusion. You may find your energy is better. Speak with your caregiver about any limitations on activity for underlying diseases you may have. SEEK MEDICAL CARE IF:   Your condition is not improving after your transfusion.  You develop redness or irritation at the intravenous (IV) site. SEEK IMMEDIATE MEDICAL CARE IF:  Any of the following symptoms occur over the next 12 hours:  Shaking chills.  You have a temperature by mouth above 102 F (38.9 C), not controlled by medicine.  Chest, back, or muscle pain.  People around you feel you are not acting correctly or are confused.  Shortness of breath or difficulty breathing.  Dizziness and fainting.  You get a rash or develop hives.  You have a decrease in urine output.  Your urine turns a dark color or changes to pink, red, or  brown. Any of the following symptoms occur over the next 10 days:  You have a temperature by mouth above 102 F (38.9 C), not controlled by  medicine.  Shortness of breath.  Weakness after normal activity.  The white part of the eye turns yellow (jaundice).  You have a decrease in the amount of urine or are urinating less often.  Your urine turns a dark color or changes to pink, red, or brown. Document Released: 04/01/2000 Document Revised: 06/27/2011 Document Reviewed: 11/19/2007 [facility] Patient Information 2014 [place], [place].  _______________________________________________________________________

## 2018-08-23 ENCOUNTER — Other Ambulatory Visit (HOSPITAL_COMMUNITY)
Admission: RE | Admit: 2018-08-23 | Discharge: 2018-08-23 | Disposition: A | Discharge status: Home / Self Care | Payer: Medicare Other | Source: Ambulatory Visit | Attending: Orthopaedic Surgery | Admitting: Orthopaedic Surgery

## 2018-08-23 ENCOUNTER — Encounter (HOSPITAL_COMMUNITY): Payer: Self-pay

## 2018-08-23 ENCOUNTER — Encounter (HOSPITAL_COMMUNITY)
Admission: RE | Admit: 2018-08-23 | Discharge: 2018-08-23 | Disposition: A | Discharge status: Home / Self Care | Payer: Medicare Other | Source: Ambulatory Visit | Attending: Orthopaedic Surgery | Admitting: Orthopaedic Surgery

## 2018-08-23 ENCOUNTER — Ambulatory Visit (HOSPITAL_COMMUNITY)
Admission: RE | Admit: 2018-08-23 | Discharge: 2018-08-23 | Disposition: A | Discharge status: Home / Self Care | Payer: Medicare Other | Source: Ambulatory Visit | Attending: Physician Assistant | Admitting: Physician Assistant

## 2018-08-23 ENCOUNTER — Other Ambulatory Visit: Payer: Self-pay

## 2018-08-23 DIAGNOSIS — Z7289 Other problems related to lifestyle: Secondary | ICD-10-CM | POA: Insufficient documentation

## 2018-08-23 DIAGNOSIS — M87051 Idiopathic aseptic necrosis of right femur: Secondary | ICD-10-CM

## 2018-08-23 DIAGNOSIS — E785 Hyperlipidemia, unspecified: Secondary | ICD-10-CM | POA: Diagnosis not present

## 2018-08-23 DIAGNOSIS — K219 Gastro-esophageal reflux disease without esophagitis: Secondary | ICD-10-CM | POA: Diagnosis not present

## 2018-08-23 DIAGNOSIS — R569 Unspecified convulsions: Secondary | ICD-10-CM | POA: Diagnosis not present

## 2018-08-23 DIAGNOSIS — G4700 Insomnia, unspecified: Secondary | ICD-10-CM | POA: Insufficient documentation

## 2018-08-23 DIAGNOSIS — J449 Chronic obstructive pulmonary disease, unspecified: Secondary | ICD-10-CM | POA: Insufficient documentation

## 2018-08-23 DIAGNOSIS — F329 Major depressive disorder, single episode, unspecified: Secondary | ICD-10-CM | POA: Insufficient documentation

## 2018-08-23 DIAGNOSIS — Z1159 Encounter for screening for other viral diseases: Secondary | ICD-10-CM | POA: Insufficient documentation

## 2018-08-23 DIAGNOSIS — F17200 Nicotine dependence, unspecified, uncomplicated: Secondary | ICD-10-CM | POA: Diagnosis not present

## 2018-08-23 DIAGNOSIS — M25551 Pain in right hip: Secondary | ICD-10-CM | POA: Diagnosis not present

## 2018-08-23 DIAGNOSIS — Z79899 Other long term (current) drug therapy: Secondary | ICD-10-CM | POA: Diagnosis not present

## 2018-08-23 HISTORY — DX: Major depressive disorder, single episode, unspecified: F32.9

## 2018-08-23 HISTORY — DX: Nasal congestion: R09.81

## 2018-08-23 HISTORY — DX: Chronic obstructive pulmonary disease, unspecified: J44.9

## 2018-08-23 HISTORY — DX: Simple chronic bronchitis: J41.0

## 2018-08-23 HISTORY — DX: Alcohol abuse, in remission: F10.11

## 2018-08-23 HISTORY — DX: Personal history of other (healed) physical injury and trauma: Z87.828

## 2018-08-23 HISTORY — DX: Personal history of Methicillin resistant Staphylococcus aureus infection: Z86.14

## 2018-08-23 HISTORY — DX: Presence of dental prosthetic device (complete) (partial): Z97.2

## 2018-08-23 HISTORY — DX: Nocturia: R35.1

## 2018-08-23 HISTORY — DX: Urgency of urination: R39.15

## 2018-08-23 HISTORY — DX: Hyperlipidemia, unspecified: E78.5

## 2018-08-23 HISTORY — DX: Insomnia, unspecified: G47.00

## 2018-08-23 HISTORY — DX: Frequency of micturition: R35.0

## 2018-08-23 HISTORY — DX: Cocaine abuse, uncomplicated: F14.10

## 2018-08-23 HISTORY — DX: Interstitial cystitis (chronic) without hematuria: N30.10

## 2018-08-23 HISTORY — DX: Other specified health status: Z78.9

## 2018-08-23 HISTORY — DX: Gastro-esophageal reflux disease without esophagitis: K21.9

## 2018-08-23 HISTORY — DX: Idiopathic aseptic necrosis of right femur: M87.051

## 2018-08-23 HISTORY — DX: Barrett's esophagus without dysplasia: K22.70

## 2018-08-23 HISTORY — DX: Reserved for concepts with insufficient information to code with codable children: IMO0002

## 2018-08-23 HISTORY — DX: Dysuria: R30.0

## 2018-08-23 HISTORY — DX: Personal history of peptic ulcer disease: Z87.11

## 2018-08-23 HISTORY — DX: Cough: R05

## 2018-08-23 HISTORY — DX: Adverse effect of unspecified anesthetic, initial encounter: T41.45XA

## 2018-08-23 HISTORY — DX: Other psychoactive substance abuse, in remission: F19.11

## 2018-08-23 HISTORY — DX: Personal history of self-harm: Z91.5

## 2018-08-23 HISTORY — DX: Epilepsy, unspecified, not intractable, without status epilepticus: G40.909

## 2018-08-23 HISTORY — DX: Encounter for adoption services: Z02.82

## 2018-08-23 HISTORY — DX: Pelvic and perineal pain: R10.2

## 2018-08-23 HISTORY — DX: Diverticulosis of large intestine without perforation or abscess without bleeding: K57.30

## 2018-08-23 HISTORY — DX: Other specified cough: R05.8

## 2018-08-23 HISTORY — DX: Personal history of other diseases of the digestive system: Z87.19

## 2018-08-23 LAB — COMPREHENSIVE METABOLIC PANEL WITH GFR
ALT: 17 U/L (ref 0–44)
AST: 17 U/L (ref 15–41)
Albumin: 4.2 g/dL (ref 3.5–5.0)
Alkaline Phosphatase: 86 U/L (ref 38–126)
Anion gap: 11 (ref 5–15)
BUN: 17 mg/dL (ref 8–23)
CO2: 20 mmol/L — ABNORMAL LOW (ref 22–32)
Calcium: 9.1 mg/dL (ref 8.9–10.3)
Chloride: 107 mmol/L (ref 98–111)
Creatinine, Ser: 0.85 mg/dL (ref 0.44–1.00)
GFR calc Af Amer: >60 mL/min
GFR calc non Af Amer: >60 mL/min
Glucose, Bld: 83 mg/dL (ref 70–99)
Potassium: 4.2 mmol/L (ref 3.5–5.1)
Sodium: 138 mmol/L (ref 135–145)
Total Bilirubin: 0.2 mg/dL — ABNORMAL LOW (ref 0.3–1.2)
Total Protein: 7.4 g/dL (ref 6.5–8.1)

## 2018-08-23 LAB — CBC WITH DIFFERENTIAL/PLATELET
Abs Immature Granulocytes: 0.05 10*3/uL (ref 0.00–0.07)
Basophils Absolute: 0.1 10*3/uL (ref 0.0–0.1)
Basophils Relative: 1 %
Eosinophils Absolute: 0.4 10*3/uL (ref 0.0–0.5)
Eosinophils Relative: 3 %
HCT: 47.6 % — ABNORMAL HIGH (ref 36.0–46.0)
Hemoglobin: 15.9 g/dL — ABNORMAL HIGH (ref 12.0–15.0)
Immature Granulocytes: 0 %
Lymphocytes Relative: 30 %
Lymphs Abs: 3.3 10*3/uL (ref 0.7–4.0)
MCH: 32.2 pg (ref 26.0–34.0)
MCHC: 33.4 g/dL (ref 30.0–36.0)
MCV: 96.4 fL (ref 80.0–100.0)
Monocytes Absolute: 0.9 10*3/uL (ref 0.1–1.0)
Monocytes Relative: 8 %
Neutro Abs: 6.5 10*3/uL (ref 1.7–7.7)
Neutrophils Relative %: 58 %
Platelets: 349 10*3/uL (ref 150–400)
RBC: 4.94 MIL/uL (ref 3.87–5.11)
RDW: 13.6 % (ref 11.5–15.5)
WBC: 11.2 10*3/uL — ABNORMAL HIGH (ref 4.0–10.5)
nRBC: 0 % (ref 0.0–0.2)

## 2018-08-23 LAB — APTT: aPTT: 42 s — ABNORMAL HIGH (ref 24–36)

## 2018-08-23 LAB — ABO/RH: ABO/RH(D): A POS

## 2018-08-23 LAB — SURGICAL PCR SCREEN
MRSA, PCR: NEGATIVE
Staphylococcus aureus: NEGATIVE

## 2018-08-23 LAB — PROTIME-INR
INR: 1 (ref 0.8–1.2)
Prothrombin Time: 12.7 s (ref 11.4–15.2)

## 2018-08-23 NOTE — Progress Notes (Signed)
SPOKE W/  _ PT AT PAT APPOINTMENT     SCREENING SYMPTOMS OF COVID 19:   COUGH-- NO  RUNNY NOSE---  NO  SORE THROAT--- NO  NASAL CONGESTION---- NO  SNEEZING---- NO  SHORTNESS OF BREATH--- NO  DIFFICULTY BREATHING--- NO  TEMP >100.0 ----- NO  UNEXPLAINED BODY ACHES------ NO  CHILLS --------  NO  HEADACHES --------- NO  LOSS OF SMELL/ TASTE -------- NO    HAVE YOU OR ANY FAMILY MEMBER TRAVELLED PAST 14 DAYS OUT OF THE   COUNTY--- per pt her brother is her support system and he lives in [place]---- No COUNTRY---- no  HAVE YOU OR ANY FAMILY MEMBER BEEN EXPOSED TO ANYONE WITH COVID 19?  Denies

## 2018-08-23 NOTE — Progress Notes (Signed)
EKG dated 07-03-2018 in epic.  Pt verbalized understanding no cocaine/ drug use from now until surgery and will have drug screen done morning of surgery. Per pt last used 2 wks ago (approx. April 2020).   Final CXR pending.   Chart to anesthesia for review, Chip Boer PA.

## 2018-08-24 ENCOUNTER — Telehealth: Payer: Self-pay | Admitting: Orthopaedic Surgery

## 2018-08-24 LAB — NOVEL CORONAVIRUS, NAA (HOSP ORDER, SEND-OUT TO REF LAB; TAT 18-24 HRS): SARS-CoV-2, NAA: NOT DETECTED
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
## 2018-08-24 NOTE — Progress Notes (Signed)
SPOKE W/  Patient via phone     SCREENING SYMPTOMS OF COVID 19:   COUGH--no  RUNNY NOSE--- no  SORE THROAT---no  NASAL CONGESTION----no  SNEEZING----no  SHORTNESS OF BREATH---no  DIFFICULTY BREATHING---no  TEMP >100.0 -----no  UNEXPLAINED BODY ACHES------no  CHILLS -------- no  HEADACHES ---------no  LOSS OF SMELL/ TASTE --------no    HAVE YOU OR ANY FAMILY MEMBER TRAVELLED PAST 14 DAYS OUT OF THE   COUNTY---no STATE----no COUNTRY----no  HAVE YOU OR ANY FAMILY MEMBER BEEN EXPOSED TO ANYONE WITH COVID 19? no     

## 2018-08-24 NOTE — Telephone Encounter (Signed)
Pt called asking for a refill on her Vicodin.

## 2018-08-24 NOTE — Anesthesia Preprocedure Evaluation (Addendum)
Anesthesia Evaluation  Patient identified by MRN, date of birth, ID band Patient awake    Reviewed: Allergy & Precautions, NPO status , Patient's Chart, lab work & pertinent test results  Airway Mallampati: II  TM Distance: >3 FB     Dental  (+) Dental Advisory Given   Pulmonary COPD, Current Smoker,    breath sounds clear to auscultation       Cardiovascular negative cardio ROS   Rhythm:Regular Rate:Normal     Neuro/Psych Seizures -, Well Controlled,  Anxiety Depression    GI/Hepatic GERD  ,(+)     substance abuse  ,   Endo/Other  negative endocrine ROS  Renal/GU negative Renal ROS     Musculoskeletal  (+) Arthritis ,   Abdominal   Peds  Hematology negative hematology ROS (+)   Anesthesia Other Findings   Reproductive/Obstetrics                            Lab Results  Component Value Date   WBC 11.2 (H) 08/23/2018   HGB 15.9 (H) 08/23/2018   HCT 47.6 (H) 08/23/2018   MCV 96.4 08/23/2018   PLT 349 08/23/2018   Lab Results  Component Value Date   CREATININE 0.85 08/23/2018   BUN 17 08/23/2018   NA 138 08/23/2018   K 4.2 08/23/2018   CL 107 08/23/2018   CO2 20 (L) 08/23/2018    Anesthesia Physical Anesthesia Plan  ASA: II  Anesthesia Plan: General   Post-op Pain Management:    Induction: Intravenous and Rapid sequence  PONV Risk Score and Plan: 2 and Dexamethasone, Ondansetron and Treatment may vary due to age or medical condition  Airway Management Planned: Oral ETT  Additional Equipment:   Intra-op Plan:   Post-operative Plan: Extubation in OR  Informed Consent: I have reviewed the patients History and Physical, chart, labs and discussed the procedure including the risks, benefits and alternatives for the proposed anesthesia with the patient or authorized representative who has indicated his/her understanding and acceptance.     Dental advisory  given  Plan Discussed with:   Anesthesia Plan Comments: (Pt refused spinal. Plan GETA.)       Anesthesia Quick Evaluation

## 2018-08-26 MED ORDER — TRANEXAMIC ACID 1000 MG/10ML IV SOLN
2000.0000 mg | INTRAVENOUS | Status: DC
  Filled 2018-08-26 (×2): qty 20

## 2018-08-27 ENCOUNTER — Ambulatory Visit (HOSPITAL_COMMUNITY): Payer: Medicare Other | Admitting: Physician Assistant

## 2018-08-27 ENCOUNTER — Encounter (HOSPITAL_COMMUNITY): Payer: Self-pay | Admitting: *Deleted

## 2018-08-27 ENCOUNTER — Ambulatory Visit (HOSPITAL_COMMUNITY): Payer: Medicare Other

## 2018-08-27 ENCOUNTER — Encounter (HOSPITAL_COMMUNITY)
Admission: RE | Disposition: A | Discharge status: Home / Self Care | Payer: Self-pay | Source: Home / Self Care | Attending: Orthopaedic Surgery

## 2018-08-27 ENCOUNTER — Observation Stay (HOSPITAL_COMMUNITY): Payer: Medicare Other

## 2018-08-27 ENCOUNTER — Observation Stay (HOSPITAL_COMMUNITY)
Admission: RE | Admit: 2018-08-27 | Discharge: 2018-08-28 | Disposition: A | Discharge status: Home / Self Care | Payer: Medicare Other | Attending: Orthopaedic Surgery | Admitting: Orthopaedic Surgery

## 2018-08-27 DIAGNOSIS — Z791 Long term (current) use of non-steroidal anti-inflammatories (NSAID): Secondary | ICD-10-CM | POA: Diagnosis not present

## 2018-08-27 DIAGNOSIS — J449 Chronic obstructive pulmonary disease, unspecified: Secondary | ICD-10-CM | POA: Diagnosis not present

## 2018-08-27 DIAGNOSIS — E785 Hyperlipidemia, unspecified: Secondary | ICD-10-CM | POA: Insufficient documentation

## 2018-08-27 DIAGNOSIS — F329 Major depressive disorder, single episode, unspecified: Secondary | ICD-10-CM | POA: Insufficient documentation

## 2018-08-27 DIAGNOSIS — M1611 Unilateral primary osteoarthritis, right hip: Secondary | ICD-10-CM | POA: Diagnosis not present

## 2018-08-27 DIAGNOSIS — M87051 Idiopathic aseptic necrosis of right femur: Secondary | ICD-10-CM | POA: Diagnosis not present

## 2018-08-27 DIAGNOSIS — Z96641 Presence of right artificial hip joint: Secondary | ICD-10-CM

## 2018-08-27 DIAGNOSIS — F17210 Nicotine dependence, cigarettes, uncomplicated: Secondary | ICD-10-CM | POA: Insufficient documentation

## 2018-08-27 DIAGNOSIS — E782 Mixed hyperlipidemia: Secondary | ICD-10-CM | POA: Diagnosis not present

## 2018-08-27 DIAGNOSIS — G40909 Epilepsy, unspecified, not intractable, without status epilepticus: Secondary | ICD-10-CM | POA: Diagnosis not present

## 2018-08-27 DIAGNOSIS — Z79899 Other long term (current) drug therapy: Secondary | ICD-10-CM | POA: Insufficient documentation

## 2018-08-27 DIAGNOSIS — M879 Osteonecrosis, unspecified: Secondary | ICD-10-CM | POA: Diagnosis present

## 2018-08-27 DIAGNOSIS — F419 Anxiety disorder, unspecified: Secondary | ICD-10-CM | POA: Diagnosis not present

## 2018-08-27 DIAGNOSIS — Z471 Aftercare following joint replacement surgery: Secondary | ICD-10-CM | POA: Diagnosis not present

## 2018-08-27 DIAGNOSIS — Z96649 Presence of unspecified artificial hip joint: Secondary | ICD-10-CM

## 2018-08-27 DIAGNOSIS — Z419 Encounter for procedure for purposes other than remedying health state, unspecified: Secondary | ICD-10-CM

## 2018-08-27 HISTORY — DX: Dyspnea, unspecified: R06.00

## 2018-08-27 HISTORY — PX: TOTAL HIP ARTHROPLASTY: SHX124

## 2018-08-27 LAB — RAPID URINE DRUG SCREEN, HOSP PERFORMED
Amphetamines: NOT DETECTED
Barbiturates: NOT DETECTED
Benzodiazepines: NOT DETECTED
Cocaine: NOT DETECTED
Opiates: NOT DETECTED
Tetrahydrocannabinol: NOT DETECTED

## 2018-08-27 LAB — TYPE AND SCREEN
ABO/RH(D): A POS
Antibody Screen: NEGATIVE

## 2018-08-27 SURGERY — TOTAL HIP ARTHROPLASTY ANTERIOR APPROACH
Anesthesia: General | Site: Hip | Laterality: Right

## 2018-08-27 MED ORDER — PROMETHAZINE HCL 25 MG PO TABS: 25.0000 mg | ORAL_TABLET | Freq: Four times a day (QID) | ORAL | 1 refills | Status: DC | PRN

## 2018-08-27 MED ORDER — FENTANYL CITRATE (PF) 100 MCG/2ML IJ SOLN
INTRAMUSCULAR | Status: AC
  Administered 2018-08-27: 50 ug via INTRAVENOUS
  Filled 2018-08-27: qty 2

## 2018-08-27 MED ORDER — ONDANSETRON HCL 4 MG/2ML IJ SOLN
INTRAMUSCULAR | Status: AC
  Filled 2018-08-27: qty 2

## 2018-08-27 MED ORDER — OXYCODONE HCL ER 10 MG PO T12A: 10.0000 mg | EXTENDED_RELEASE_TABLET | Freq: Two times a day (BID) | ORAL | 0 refills | Status: AC

## 2018-08-27 MED ORDER — LIP MEDEX EX OINT
TOPICAL_OINTMENT | CUTANEOUS | Status: AC
  Filled 2018-08-27: qty 7

## 2018-08-27 MED ORDER — SIMVASTATIN 40 MG PO TABS
40.0000 mg | ORAL_TABLET | Freq: Every evening | ORAL | Status: DC
  Administered 2018-08-27: 40 mg via ORAL
  Filled 2018-08-27: qty 1

## 2018-08-27 MED ORDER — METHOCARBAMOL 500 MG IVPB - SIMPLE MED
INTRAVENOUS | Status: AC
  Filled 2018-08-27: qty 50

## 2018-08-27 MED ORDER — ARIPIPRAZOLE 5 MG PO TABS
15.0000 mg | ORAL_TABLET | Freq: Every day | ORAL | Status: DC
  Administered 2018-08-27: 15 mg via ORAL
  Filled 2018-08-27: qty 1

## 2018-08-27 MED ORDER — PHENYLEPHRINE 40 MCG/ML (10ML) SYRINGE FOR IV PUSH (FOR BLOOD PRESSURE SUPPORT)
PREFILLED_SYRINGE | INTRAVENOUS | Status: AC
  Filled 2018-08-27: qty 10

## 2018-08-27 MED ORDER — METOCLOPRAMIDE HCL 5 MG PO TABS: 5.0000 mg | ORAL_TABLET | Freq: Three times a day (TID) | ORAL | Status: DC | PRN

## 2018-08-27 MED ORDER — ROCURONIUM BROMIDE 10 MG/ML (PF) SYRINGE
PREFILLED_SYRINGE | INTRAVENOUS | Status: AC
  Filled 2018-08-27: qty 10

## 2018-08-27 MED ORDER — POLYETHYLENE GLYCOL 3350 17 G PO PACK: 17.0000 g | PACK | Freq: Every day | ORAL | Status: DC | PRN

## 2018-08-27 MED ORDER — METHOCARBAMOL 500 MG IVPB - SIMPLE MED
500.0000 mg | Freq: Four times a day (QID) | INTRAVENOUS | Status: DC | PRN
  Filled 2018-08-27: qty 50

## 2018-08-27 MED ORDER — ROCURONIUM BROMIDE 50 MG/5ML IV SOSY
PREFILLED_SYRINGE | INTRAVENOUS | Status: DC | PRN
  Administered 2018-08-27: 40 mg via INTRAVENOUS
  Administered 2018-08-27 (×2): 10 mg via INTRAVENOUS

## 2018-08-27 MED ORDER — CHLORHEXIDINE GLUCONATE 4 % EX LIQD: 60.0000 mL | Freq: Once | CUTANEOUS | Status: DC

## 2018-08-27 MED ORDER — PROPOFOL 10 MG/ML IV BOLUS
INTRAVENOUS | Status: DC | PRN
  Administered 2018-08-27: 120 mg via INTRAVENOUS
  Administered 2018-08-27: 80 mg via INTRAVENOUS

## 2018-08-27 MED ORDER — SORBITOL 70 % SOLN
30.0000 mL | Freq: Every day | Status: DC | PRN
  Filled 2018-08-27: qty 30

## 2018-08-27 MED ORDER — SODIUM CHLORIDE 0.9 % IR SOLN
Status: DC | PRN
  Administered 2018-08-27: 3000 mL

## 2018-08-27 MED ORDER — LIDOCAINE 2% (20 MG/ML) 5 ML SYRINGE
INTRAMUSCULAR | Status: AC
  Filled 2018-08-27: qty 5

## 2018-08-27 MED ORDER — METOCLOPRAMIDE HCL 5 MG/ML IJ SOLN: 5.0000 mg | Freq: Three times a day (TID) | INTRAMUSCULAR | Status: DC | PRN

## 2018-08-27 MED ORDER — FENTANYL CITRATE (PF) 100 MCG/2ML IJ SOLN
INTRAMUSCULAR | Status: AC
  Filled 2018-08-27: qty 2

## 2018-08-27 MED ORDER — SENNOSIDES-DOCUSATE SODIUM 8.6-50 MG PO TABS: 1.0000 | ORAL_TABLET | Freq: Every evening | ORAL | 1 refills | Status: DC | PRN

## 2018-08-27 MED ORDER — SUGAMMADEX SODIUM 200 MG/2ML IV SOLN
INTRAVENOUS | Status: AC
  Filled 2018-08-27: qty 2

## 2018-08-27 MED ORDER — KETAMINE HCL 10 MG/ML IJ SOLN
INTRAMUSCULAR | Status: DC | PRN
  Administered 2018-08-27 (×2): 20 mg via INTRAVENOUS

## 2018-08-27 MED ORDER — DEXMEDETOMIDINE HCL IN NACL 200 MCG/50ML IV SOLN
INTRAVENOUS | Status: DC | PRN
  Administered 2018-08-27 (×4): 8 ug via INTRAVENOUS

## 2018-08-27 MED ORDER — ASPIRIN 81 MG PO CHEW
81.0000 mg | CHEWABLE_TABLET | Freq: Two times a day (BID) | ORAL | Status: DC
  Administered 2018-08-27 – 2018-08-28 (×2): 81 mg via ORAL
  Filled 2018-08-27 (×2): qty 1

## 2018-08-27 MED ORDER — 0.9 % SODIUM CHLORIDE (POUR BTL) OPTIME
TOPICAL | Status: DC | PRN
  Administered 2018-08-27: 1000 mL

## 2018-08-27 MED ORDER — DOCUSATE SODIUM 100 MG PO CAPS
100.0000 mg | ORAL_CAPSULE | Freq: Two times a day (BID) | ORAL | Status: DC
  Administered 2018-08-27 – 2018-08-28 (×2): 100 mg via ORAL
  Filled 2018-08-27 (×2): qty 1

## 2018-08-27 MED ORDER — MIDAZOLAM HCL 2 MG/2ML IJ SOLN
INTRAMUSCULAR | Status: DC | PRN
  Administered 2018-08-27: 2 mg via INTRAVENOUS

## 2018-08-27 MED ORDER — SUGAMMADEX SODIUM 200 MG/2ML IV SOLN
INTRAVENOUS | Status: DC | PRN
  Administered 2018-08-27: 200 mg via INTRAVENOUS

## 2018-08-27 MED ORDER — ALUM & MAG HYDROXIDE-SIMETH 200-200-20 MG/5ML PO SUSP: 30.0000 mL | ORAL | Status: DC | PRN

## 2018-08-27 MED ORDER — DOXEPIN HCL 10 MG PO CAPS
20.0000 mg | ORAL_CAPSULE | Freq: Every day | ORAL | Status: DC
  Administered 2018-08-27: 20 mg via ORAL
  Filled 2018-08-27: qty 2

## 2018-08-27 MED ORDER — HYDROMORPHONE HCL 1 MG/ML IJ SOLN
0.2500 mg | INTRAMUSCULAR | Status: DC | PRN
  Administered 2018-08-27 (×3): 0.5 mg via INTRAVENOUS

## 2018-08-27 MED ORDER — HYDROMORPHONE HCL 1 MG/ML IJ SOLN
0.5000 mg | INTRAMUSCULAR | Status: DC | PRN
  Administered 2018-08-27: 1 mg via INTRAVENOUS
  Filled 2018-08-27: qty 1

## 2018-08-27 MED ORDER — KETOROLAC TROMETHAMINE 30 MG/ML IJ SOLN
INTRAMUSCULAR | Status: AC
  Filled 2018-08-27: qty 1

## 2018-08-27 MED ORDER — VANCOMYCIN HCL 1000 MG IV SOLR
INTRAVENOUS | Status: DC | PRN
  Administered 2018-08-27: 1000 mg via TOPICAL

## 2018-08-27 MED ORDER — KETOROLAC TROMETHAMINE 15 MG/ML IJ SOLN
15.0000 mg | Freq: Four times a day (QID) | INTRAMUSCULAR | Status: AC
  Administered 2018-08-28 (×3): 15 mg via INTRAVENOUS
  Filled 2018-08-27 (×4): qty 1

## 2018-08-27 MED ORDER — MIDAZOLAM HCL 2 MG/2ML IJ SOLN
INTRAMUSCULAR | Status: AC
  Filled 2018-08-27: qty 2

## 2018-08-27 MED ORDER — KETOROLAC TROMETHAMINE 15 MG/ML IJ SOLN: 30.0000 mg | Freq: Four times a day (QID) | INTRAMUSCULAR | Status: DC

## 2018-08-27 MED ORDER — METHOCARBAMOL 500 MG PO TABS: 500.0000 mg | ORAL_TABLET | Freq: Four times a day (QID) | ORAL | Status: DC | PRN

## 2018-08-27 MED ORDER — ACETAMINOPHEN 500 MG PO TABS
1000.0000 mg | ORAL_TABLET | Freq: Four times a day (QID) | ORAL | Status: AC
  Administered 2018-08-27 – 2018-08-28 (×4): 1000 mg via ORAL
  Filled 2018-08-27 (×4): qty 2

## 2018-08-27 MED ORDER — OXYCODONE HCL 5 MG PO TABS
10.0000 mg | ORAL_TABLET | ORAL | Status: DC | PRN
  Filled 2018-08-27: qty 2

## 2018-08-27 MED ORDER — OXYCODONE HCL ER 10 MG PO T12A
10.0000 mg | EXTENDED_RELEASE_TABLET | Freq: Two times a day (BID) | ORAL | Status: DC
  Administered 2018-08-27 – 2018-08-28 (×2): 10 mg via ORAL
  Filled 2018-08-27 (×2): qty 1

## 2018-08-27 MED ORDER — TRANEXAMIC ACID 1000 MG/10ML IV SOLN
INTRAVENOUS | Status: DC | PRN
  Administered 2018-08-27: 2000 mg via TOPICAL

## 2018-08-27 MED ORDER — MIRTAZAPINE 15 MG PO TABS
45.0000 mg | ORAL_TABLET | Freq: Every day | ORAL | Status: DC
  Administered 2018-08-27: 45 mg via ORAL
  Filled 2018-08-27: qty 3

## 2018-08-27 MED ORDER — ACETAMINOPHEN 325 MG PO TABS: 325.0000 mg | ORAL_TABLET | Freq: Four times a day (QID) | ORAL | Status: DC | PRN

## 2018-08-27 MED ORDER — ONDANSETRON HCL 4 MG PO TABS: 4.0000 mg | ORAL_TABLET | Freq: Three times a day (TID) | ORAL | 0 refills | Status: DC | PRN

## 2018-08-27 MED ORDER — SODIUM CHLORIDE 0.9 % IV SOLN
INTRAVENOUS | Status: DC
  Administered 2018-08-27: 1000 mL via INTRAVENOUS
  Administered 2018-08-27 – 2018-08-28 (×2): via INTRAVENOUS

## 2018-08-27 MED ORDER — ONDANSETRON HCL 4 MG PO TABS: 4.0000 mg | ORAL_TABLET | Freq: Four times a day (QID) | ORAL | Status: DC | PRN

## 2018-08-27 MED ORDER — LAMOTRIGINE 100 MG PO TABS
100.0000 mg | ORAL_TABLET | Freq: Every day | ORAL | Status: DC
  Administered 2018-08-28: 100 mg via ORAL
  Filled 2018-08-27: qty 1

## 2018-08-27 MED ORDER — ISOPROPYL ALCOHOL 70 % SOLN
Status: AC
  Filled 2018-08-27: qty 480

## 2018-08-27 MED ORDER — FLUOXETINE HCL 20 MG PO CAPS
40.0000 mg | ORAL_CAPSULE | Freq: Every day | ORAL | Status: DC
  Administered 2018-08-27 – 2018-08-28 (×2): 40 mg via ORAL
  Filled 2018-08-27 (×2): qty 2

## 2018-08-27 MED ORDER — SULFAMETHOXAZOLE-TRIMETHOPRIM 800-160 MG PO TABS: 1.0000 | ORAL_TABLET | Freq: Two times a day (BID) | ORAL | 0 refills | Status: DC

## 2018-08-27 MED ORDER — METHOCARBAMOL 750 MG PO TABS: 750.0000 mg | ORAL_TABLET | Freq: Two times a day (BID) | ORAL | 0 refills | Status: DC | PRN

## 2018-08-27 MED ORDER — LIDOCAINE 2% (20 MG/ML) 5 ML SYRINGE
INTRAMUSCULAR | Status: DC | PRN
  Administered 2018-08-27: 100 mg via INTRAVENOUS

## 2018-08-27 MED ORDER — CEFAZOLIN SODIUM-DEXTROSE 2-4 GM/100ML-% IV SOLN
2.0000 g | INTRAVENOUS | Status: AC
  Administered 2018-08-27: 2 g via INTRAVENOUS
  Filled 2018-08-27: qty 100

## 2018-08-27 MED ORDER — OXYCODONE HCL 5 MG PO TABS
5.0000 mg | ORAL_TABLET | ORAL | Status: DC | PRN
  Administered 2018-08-27 – 2018-08-28 (×2): 10 mg via ORAL
  Filled 2018-08-27: qty 2

## 2018-08-27 MED ORDER — FENTANYL CITRATE (PF) 100 MCG/2ML IJ SOLN
25.0000 ug | INTRAMUSCULAR | Status: DC | PRN
  Administered 2018-08-27 (×2): 50 ug via INTRAVENOUS

## 2018-08-27 MED ORDER — VANCOMYCIN HCL 1000 MG IV SOLR
INTRAVENOUS | Status: AC
  Filled 2018-08-27: qty 1000

## 2018-08-27 MED ORDER — SUCCINYLCHOLINE CHLORIDE 200 MG/10ML IV SOSY
PREFILLED_SYRINGE | INTRAVENOUS | Status: AC
  Filled 2018-08-27: qty 10

## 2018-08-27 MED ORDER — OXYCODONE HCL 5 MG PO TABS: 5.0000 mg | ORAL_TABLET | Freq: Three times a day (TID) | ORAL | 0 refills | Status: DC | PRN

## 2018-08-27 MED ORDER — TRANEXAMIC ACID-NACL 1000-0.7 MG/100ML-% IV SOLN
1000.0000 mg | Freq: Once | INTRAVENOUS | Status: AC
  Administered 2018-08-27: 1000 mg via INTRAVENOUS
  Filled 2018-08-27: qty 100

## 2018-08-27 MED ORDER — HYDROMORPHONE HCL 1 MG/ML IJ SOLN
INTRAMUSCULAR | Status: AC
  Administered 2018-08-27: 0.5 mg via INTRAVENOUS
  Filled 2018-08-27: qty 2

## 2018-08-27 MED ORDER — MAGNESIUM CITRATE PO SOLN: 1.0000 | Freq: Once | ORAL | Status: DC | PRN

## 2018-08-27 MED ORDER — SUCCINYLCHOLINE CHLORIDE 200 MG/10ML IV SOSY
PREFILLED_SYRINGE | INTRAVENOUS | Status: DC | PRN
  Administered 2018-08-27: 80 mg via INTRAVENOUS

## 2018-08-27 MED ORDER — TRANEXAMIC ACID-NACL 1000-0.7 MG/100ML-% IV SOLN
1000.0000 mg | INTRAVENOUS | Status: AC
  Administered 2018-08-27: 1000 mg via INTRAVENOUS
  Filled 2018-08-27: qty 100

## 2018-08-27 MED ORDER — POVIDONE-IODINE 10 % EX SWAB
2.0000 "application " | Freq: Once | CUTANEOUS | Status: AC
  Administered 2018-08-27: 2 "application " via TOPICAL

## 2018-08-27 MED ORDER — HYDROXYZINE HCL 25 MG PO TABS: 25.0000 mg | ORAL_TABLET | Freq: Three times a day (TID) | ORAL | Status: DC | PRN

## 2018-08-27 MED ORDER — PROMETHAZINE HCL 25 MG/ML IJ SOLN: 6.2500 mg | INTRAMUSCULAR | Status: DC | PRN

## 2018-08-27 MED ORDER — CEFAZOLIN SODIUM-DEXTROSE 2-4 GM/100ML-% IV SOLN
2.0000 g | Freq: Four times a day (QID) | INTRAVENOUS | Status: AC
  Administered 2018-08-27 – 2018-08-28 (×3): 2 g via INTRAVENOUS
  Filled 2018-08-27 (×3): qty 100

## 2018-08-27 MED ORDER — ASPIRIN EC 81 MG PO TBEC: 81.0000 mg | DELAYED_RELEASE_TABLET | Freq: Two times a day (BID) | ORAL | 0 refills | Status: DC

## 2018-08-27 MED ORDER — ONDANSETRON HCL 4 MG/2ML IJ SOLN: 4.0000 mg | Freq: Four times a day (QID) | INTRAMUSCULAR | Status: DC | PRN

## 2018-08-27 MED ORDER — METHOCARBAMOL 1000 MG/10ML IJ SOLN
500.0000 mg | Freq: Four times a day (QID) | INTRAVENOUS | Status: DC | PRN
  Filled 2018-08-27 (×2): qty 5

## 2018-08-27 MED ORDER — DEXAMETHASONE SODIUM PHOSPHATE 10 MG/ML IJ SOLN
INTRAMUSCULAR | Status: DC | PRN
  Administered 2018-08-27: 10 mg via INTRAVENOUS

## 2018-08-27 MED ORDER — OXYMETAZOLINE HCL 0.05 % NA SOLN: 1.0000 | Freq: Two times a day (BID) | NASAL | Status: DC | PRN

## 2018-08-27 MED ORDER — DEXAMETHASONE SODIUM PHOSPHATE 10 MG/ML IJ SOLN
10.0000 mg | Freq: Once | INTRAMUSCULAR | Status: AC
  Administered 2018-08-28: 10 mg via INTRAVENOUS
  Filled 2018-08-27: qty 1

## 2018-08-27 MED ORDER — DEXMEDETOMIDINE HCL IN NACL 200 MCG/50ML IV SOLN
INTRAVENOUS | Status: AC
  Filled 2018-08-27: qty 50

## 2018-08-27 MED ORDER — MENTHOL 3 MG MT LOZG: 1.0000 | LOZENGE | OROMUCOSAL | Status: DC | PRN

## 2018-08-27 MED ORDER — DEXAMETHASONE SODIUM PHOSPHATE 10 MG/ML IJ SOLN
INTRAMUSCULAR | Status: AC
  Filled 2018-08-27: qty 1

## 2018-08-27 MED ORDER — FENTANYL CITRATE (PF) 100 MCG/2ML IJ SOLN
INTRAMUSCULAR | Status: DC | PRN
  Administered 2018-08-27: 50 ug via INTRAVENOUS
  Administered 2018-08-27: 100 ug via INTRAVENOUS
  Administered 2018-08-27 (×3): 50 ug via INTRAVENOUS

## 2018-08-27 MED ORDER — PHENOL 1.4 % MT LIQD: 1.0000 | OROMUCOSAL | Status: DC | PRN

## 2018-08-27 MED ORDER — GABAPENTIN 300 MG PO CAPS
300.0000 mg | ORAL_CAPSULE | Freq: Three times a day (TID) | ORAL | Status: DC
  Administered 2018-08-27 – 2018-08-28 (×3): 300 mg via ORAL
  Filled 2018-08-27 (×3): qty 1

## 2018-08-27 MED ORDER — KETAMINE HCL 10 MG/ML IJ SOLN
INTRAMUSCULAR | Status: AC
  Filled 2018-08-27: qty 1

## 2018-08-27 MED ORDER — ONDANSETRON HCL 4 MG/2ML IJ SOLN
INTRAMUSCULAR | Status: DC | PRN
  Administered 2018-08-27: 4 mg via INTRAVENOUS

## 2018-08-27 MED ORDER — KETOROLAC TROMETHAMINE 15 MG/ML IJ SOLN
30.0000 mg | Freq: Four times a day (QID) | INTRAMUSCULAR | Status: DC
  Administered 2018-08-27: 30 mg via INTRAVENOUS
  Filled 2018-08-27: qty 2

## 2018-08-27 MED ORDER — NICOTINE 7 MG/24HR TD PT24
7.0000 mg | MEDICATED_PATCH | Freq: Every day | TRANSDERMAL | Status: DC
  Administered 2018-08-27 – 2018-08-28 (×2): 7 mg via TRANSDERMAL
  Filled 2018-08-27 (×2): qty 1

## 2018-08-27 MED ORDER — DIPHENHYDRAMINE HCL 12.5 MG/5ML PO ELIX: 25.0000 mg | ORAL_SOLUTION | ORAL | Status: DC | PRN

## 2018-08-27 MED ORDER — LACTATED RINGERS IV SOLN
INTRAVENOUS | Status: DC
  Administered 2018-08-27 (×2): via INTRAVENOUS

## 2018-08-27 MED ORDER — PHENYLEPHRINE 40 MCG/ML (10ML) SYRINGE FOR IV PUSH (FOR BLOOD PRESSURE SUPPORT)
PREFILLED_SYRINGE | INTRAVENOUS | Status: DC | PRN
  Administered 2018-08-27: 80 ug via INTRAVENOUS
  Administered 2018-08-27: 40 ug via INTRAVENOUS
  Administered 2018-08-27: 80 ug via INTRAVENOUS
  Administered 2018-08-27: 40 ug via INTRAVENOUS
  Administered 2018-08-27 (×2): 80 ug via INTRAVENOUS
  Administered 2018-08-27 (×3): 40 ug via INTRAVENOUS

## 2018-08-27 MED ORDER — PROPOFOL 10 MG/ML IV BOLUS
INTRAVENOUS | Status: AC
  Filled 2018-08-27: qty 60

## 2018-08-27 SURGICAL SUPPLY — 46 items
ACETAB CUP W/GRIPTION 54 (Plate) ×2 IMPLANT
APL PRP STRL LF DISP 70% ISPRP (MISCELLANEOUS) ×1
BAG SPEC THK2 15X12 ZIP CLS (MISCELLANEOUS) ×1
BAG ZIPLOCK 12X15 (MISCELLANEOUS) ×2
BLADE SAW SGTL 18X1.27X75 (BLADE) ×2
CHLORAPREP W/TINT 26 (MISCELLANEOUS) ×2
COVER PERINEAL POST (MISCELLANEOUS) ×2
COVER SURGICAL LIGHT HANDLE (MISCELLANEOUS) ×2
COVER WAND RF STERILE (DRAPES)
DRAPE STERI IOBAN 125X83 (DRAPES) ×2
DRAPE U-SHAPE 47X51 STRL (DRAPES) ×4
DRSG AQUACEL AG ADV 3.5X10 (GAUZE/BANDAGES/DRESSINGS) ×2
DRSG MEPILEX BORDER 4X8 (GAUZE/BANDAGES/DRESSINGS) ×2
ELECT BLADE TIP CTD 4 INCH (ELECTRODE) ×4
ELECT REM PT RETURN 15FT ADLT (MISCELLANEOUS) ×2
GLOVE BIOGEL PI IND STRL 7.0 (GLOVE) ×1
GLOVE BIOGEL PI INDICATOR 7.0 (GLOVE) ×1
GLOVE ECLIPSE 7.0 STRL STRAW (GLOVE) ×2
GLOVE SKINSENSE NS SZ7.5 (GLOVE) ×1
GLOVE SKINSENSE STRL SZ7.5 (GLOVE) ×1
GLOVE SURG SYN 7.5  E (GLOVE) ×4
GLOVE SURG SYN 7.5 E (GLOVE) ×4 IMPLANT
GOWN STRL REIN XL XLG (GOWN DISPOSABLE) ×4
GOWN STRL REUS W/TWL LRG LVL3 (GOWN DISPOSABLE) ×2
HEAD CERAMIC 36 PLUS 8.5 12 14 (Hips) ×2 IMPLANT
HOOD PEEL AWAY FLYTE STAYCOOL (MISCELLANEOUS) ×6
KIT BASIN OR (CUSTOM PROCEDURE TRAY) ×2
KIT TURNOVER KIT A (KITS)
LINER NEUTRAL 54X36MM PLUS 4 (Hips) ×2 IMPLANT
MARKER SKIN DUAL TIP RULER LAB (MISCELLANEOUS) ×2
NDL SPNL 18GX3.5 QUINCKE PK (NEEDLE) ×1 IMPLANT
NEEDLE SPNL 18GX3.5 QUINCKE PK (NEEDLE) ×2
PACK ANTERIOR HIP CUSTOM (KITS) ×2
PACK UNIVERSAL I (CUSTOM PROCEDURE TRAY) ×4
SCREW 6.5MMX25MM (Screw) ×2 IMPLANT
STAPLER VISISTAT 35W (STAPLE) ×2
STEM FEMORAL SZ 6MM STD ACTIS (Stem) ×2 IMPLANT
SUT ETHIBOND 2 V 37 (SUTURE) ×2
SUT VIC AB 0 CT1 36 (SUTURE) ×2
SUT VIC AB 1 CT1 27 (SUTURE) ×2
SUT VIC AB 1 CT1 27XBRD ANTBC (SUTURE) ×1
SUT VIC AB 2-0 CT1 27 (SUTURE) ×2
SUT VIC AB 2-0 CT1 TAPERPNT 27 (SUTURE) ×1
TOWEL OR 17X26 10 PK STRL BLUE (TOWEL DISPOSABLE) ×2
TOWEL OR NON WOVEN STRL DISP B (DISPOSABLE) ×2
TRAY FOLEY MTR SLVR 16FR STAT (SET/KITS/TRAYS/PACK)

## 2018-08-27 NOTE — Telephone Encounter (Signed)
I am sorry I did not get this sooner.  We are operating on her today

## 2018-08-27 NOTE — Discharge Instructions (Signed)

## 2018-08-27 NOTE — Care Plan (Signed)
Ortho Bundle patient with anticipated discharge needs after surgery for R-Ant. THA of HHPT as well as a FWW and 3 in 1 for home use. She has a brother that will be assisting her after discharge. [name] will follow during hospital stay for Ortho Bundle.

## 2018-08-27 NOTE — Op Note (Signed)
RIGHT TOTAL HIP ARTHROPLASTY ANTERIOR APPROACH  Procedure Note CHRISTIN MOLINE   [id number]  Pre-op Diagnosis: right hip avascular necrosis, degenerative joint disease     Post-op Diagnosis: same   Operative Procedures  1. Total hip replacement; Right hip; uncemented [id number]   Personnel  Surgeon(s): Leandrew Koyanagi, MD  Assist: Madalyn Rob, PA-C; necessary for the timely completion of procedure and due to complexity of procedure.   Anesthesia: general  Prosthesis: Depuy Acetabulum: Pinnacle 54 mm Femur: Actis STD 6 Head: 36 mm size: +8.5 Liner: +4 neutral Bearing Type: ceramic on poly  Total Hip Arthroplasty (Anterior Approach) Op Note:  After informed consent was obtained and the operative extremity marked in the holding area, the patient was brought back to the operating room and placed supine on the HANA table. Next, the operative extremity was prepped and draped in normal sterile fashion. Surgical timeout occurred verifying patient identification, surgical site, surgical procedure and administration of antibiotics.  A modified anterior Smith-Peterson approach to the hip was performed, using the interval between tensor fascia lata and sartorius.  Dissection was carried bluntly down onto the anterior hip capsule. The lateral femoral circumflex vessels were identified and coagulated. A capsulotomy was performed and the capsular flaps tagged for later repair.  Fluoroscopy was utilized to prepare for the femoral neck cut. The neck osteotomy was performed. The femoral head was removed, the acetabular rim was cleared of soft tissue and attention was turned to reaming the acetabulum.  Sequential reaming was performed under fluoroscopic guidance. We reamed to a size 53 mm, and then impacted the acetabular shell. The liner was then placed after irrigation and attention turned to the femur.  After placing the femoral hook, the leg was taken to externally rotated, extended and  adducted position taking care to perform soft tissue releases to allow for adequate mobilization of the femur. Soft tissue was cleared from the shoulder of the greater trochanter and the hook elevator used to improve exposure of the proximal femur. Sequential broaching performed up to a size 6. Trial neck and head were placed. The leg was brought back up to neutral and the construct reduced. The position and sizing of components, offset and leg lengths were checked using fluoroscopy. Stability of the  construct was checked in extension and external rotation without any subluxation or impingement of prosthesis. We dislocated the prosthesis, dropped the leg back into position, removed trial components, and irrigated copiously. The final stem and head was then placed, the leg brought back up, the system reduced and fluoroscopy used to verify positioning.  We irrigated, obtained hemostasis and closed the capsule using #2 ethibond suture.  One gram of vancomycin powder was placed in the surgical bed. The fascia was closed with #1 vicryl plus, the deep fat layer was closed with 0 vicryl, the subcutaneous layers closed with 2.0 Vicryl Plus and the skin closed with 2.0 nylon. A sterile dressing was applied. The patient was awakened in the operating room and taken to recovery in stable condition.  All sponge, needle, and instrument counts were correct at the end of the case.   Position: supine  Complications: none.  Time Out: performed   Drains/Packing: none  Estimated blood loss: see anesthesia record  Returned to Recovery Room: in good condition.   Antibiotics: yes   Mechanical VTE (DVT) Prophylaxis: sequential compression devices, TED thigh-high  Chemical VTE (DVT) Prophylaxis: aspirin   Fluid Replacement: see anesthesia record  Specimens Removed: 1 to pathology  Sponge and Instrument Count Correct? yes   PACU: portable radiograph - low AP   Plan/RTC: Return in 2 weeks for staple removal.  Weight Bearing/Load Lower Extremity: full  Hip precautions: none Suture Removal: 2 weeks   N. Eduard Roux, MD [place] [phone] 1:03 PM   Implant Name Type Inv. Item Serial No. Manufacturer Lot No. LRB No. Used  LINER NEUTRAL [id number] PLUS 4 - SN/A Hips LINER NEUTRAL 54X36MM PLUS 4 N/A DEPUY SYNTHES [id number] Right 1  ACETABULAR CUP W GRIPTION 54MM - SN/A Plate ACETABULAR CUP W GRIPTION 54MM N/A DEPU[employer] SYN[employer]ES [id number] Right 1  SCREW 6.5MMX25MM - SN/A Screw SCREW 6.5MMX25MM N/A DEPUY SYNTHES [id number] Right 1  HEAD CERAMIC 36 PLUS 8.5 12 14  - SN/A Hips HEAD CERAMIC 36 PLUS 8.5 12 14  N/A DEPUY SYNTHES [id number] Right 1

## 2018-08-27 NOTE — Anesthesia Procedure Notes (Signed)
Procedure Name: Intubation Date/Time: 08/27/2018 11:48 AM Performed by: Genelle Bal, CRNA Pre-anesthesia Checklist: Patient identified, Emergency Drugs available, Suction available and Patient being monitored Patient Re-evaluated:Patient Re-evaluated prior to induction Oxygen Delivery Method: Circle system utilized Preoxygenation: Pre-oxygenation with 100% oxygen Induction Type: IV induction and Rapid sequence Ventilation: Mask ventilation without difficulty Laryngoscope Size: Glidescope and 3 Grade View: Grade I Tube type: Oral Tube size: 7.0 mm Number of attempts: 1 Airway Equipment and Method: Stylet Placement Confirmation: ETT inserted through vocal cords under direct vision,  positive ETCO2 and breath sounds checked- equal and bilateral Secured at: 21 cm Tube secured with: Tape Dental Injury: Teeth and Oropharynx as per pre-operative assessment  Difficulty Due To: Difficulty was unanticipated and Difficult Airway- due to anterior larynx Comments: SRSI, DLx1 Mil 2 by S. Alford Highland CRNA, grade IV view - anterior larynx, Glide 3 x1, grade I view, ATOI.

## 2018-08-27 NOTE — Transfer of Care (Signed)
Immediate Anesthesia Transfer of Care Note  Patient: [name]  Procedure(s) Performed: RIGHT TOTAL HIP ARTHROPLASTY ANTERIOR APPROACH (Right Hip)  Patient Location: PACU  Anesthesia Type:General  Level of Consciousness: drowsy and patient cooperative  Airway & Oxygen Therapy: Patient Spontanous Breathing and Patient connected to face mask oxygen  Post-op Assessment: Report given to RN and Post -op Vital signs reviewed and stable  Post vital signs: Reviewed and stable  Last Vitals:  Vitals Value Taken Time  BP 111/66 08/27/2018  1:36 PM  Temp    Pulse 70 08/27/2018  1:37 PM  Resp 17 08/27/2018  1:37 PM  SpO2 100 % 08/27/2018  1:37 PM  Vitals shown include unvalidated device data.  Last Pain:  Vitals:   08/27/18 0841  TempSrc: Oral  PainSc: 0-No pain      Patients Stated Pain Goal: 3 (03/52/48 1859)  Complications: No apparent anesthesia complications
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
## 2018-08-28 ENCOUNTER — Encounter (HOSPITAL_COMMUNITY): Payer: Self-pay | Admitting: Orthopaedic Surgery

## 2018-08-28 DIAGNOSIS — M1611 Unilateral primary osteoarthritis, right hip: Secondary | ICD-10-CM | POA: Diagnosis not present

## 2018-08-28 DIAGNOSIS — F419 Anxiety disorder, unspecified: Secondary | ICD-10-CM | POA: Diagnosis not present

## 2018-08-28 DIAGNOSIS — J449 Chronic obstructive pulmonary disease, unspecified: Secondary | ICD-10-CM | POA: Diagnosis not present

## 2018-08-28 DIAGNOSIS — F329 Major depressive disorder, single episode, unspecified: Secondary | ICD-10-CM | POA: Diagnosis not present

## 2018-08-28 DIAGNOSIS — M879 Osteonecrosis, unspecified: Secondary | ICD-10-CM | POA: Diagnosis not present

## 2018-08-28 DIAGNOSIS — F17210 Nicotine dependence, cigarettes, uncomplicated: Secondary | ICD-10-CM | POA: Diagnosis not present

## 2018-08-28 LAB — BASIC METABOLIC PANEL WITH GFR
Anion gap: 6 (ref 5–15)
BUN: 12 mg/dL (ref 8–23)
CO2: 24 mmol/L (ref 22–32)
Calcium: 8.1 mg/dL — ABNORMAL LOW (ref 8.9–10.3)
Chloride: 106 mmol/L (ref 98–111)
Creatinine, Ser: 0.71 mg/dL (ref 0.44–1.00)
GFR calc Af Amer: >60 mL/min
GFR calc non Af Amer: >60 mL/min
Glucose, Bld: 148 mg/dL — ABNORMAL HIGH (ref 70–99)
Potassium: 3.9 mmol/L (ref 3.5–5.1)
Sodium: 136 mmol/L (ref 135–145)

## 2018-08-28 LAB — CBC
HCT: 34.9 % — ABNORMAL LOW (ref 36.0–46.0)
Hemoglobin: 11.4 g/dL — ABNORMAL LOW (ref 12.0–15.0)
MCH: 31.8 pg (ref 26.0–34.0)
MCHC: 32.7 g/dL (ref 30.0–36.0)
MCV: 97.2 fL (ref 80.0–100.0)
Platelets: 258 10*3/uL (ref 150–400)
RBC: 3.59 MIL/uL — ABNORMAL LOW (ref 3.87–5.11)
RDW: 13.3 % (ref 11.5–15.5)
WBC: 14.1 10*3/uL — ABNORMAL HIGH (ref 4.0–10.5)
nRBC: 0 % (ref 0.0–0.2)
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
## 2018-08-28 NOTE — Progress Notes (Addendum)
Subjective: 1 Day Post-Op Procedure(s) (LRB): RIGHT TOTAL HIP ARTHROPLASTY ANTERIOR APPROACH (Right) Patient reports pain as moderate.  Complaining of "numbness" to lateral thigh. No nausea/vomiting, lightheadedness/dizziness, chest pain/sob.    Objective: Vital signs in last 24 hours: Temp:  [97.6 F (36.4 C)-98.7 F (37.1 C)] 98.1 F (36.7 C) (05/12 0435) Pulse Rate:  [70-86] 72 (05/12 0435) Resp:  [13-26] 16 (05/12 0435) BP: (101-144)/(66-99) 109/71 (05/12 0435) SpO2:  [95 %-100 %] 95 % (05/12 0435)  Intake/Output from previous day: 05/11 0701 - 05/12 0700 In: 3837.8 [P.O.:840; I.V.:2797.8; IV Piggyback:200] Out: 2125 [Urine:1775; Blood:350] Intake/Output this shift: No intake/output data recorded.  Recent Labs    08/28/18 0327  HGB 11.4*   Recent Labs    08/28/18 0327  WBC 14.1*  RBC 3.59*  HCT 34.9*  PLT 258   Recent Labs    08/28/18 0327  NA 136  K 3.9  CL 106  CO2 24  BUN 12  CREATININE 0.71  GLUCOSE 148*  CALCIUM 8.1*   No results for input(s): LABPT, INR in the last 72 hours.  Neurologically intact Neurovascular intact Sensation intact distally Intact pulses distally Dorsiflexion/Plantar flexion intact Incision: dressing C/D/I No cellulitis present Compartment soft  Slightly decreased sensation to lateral thigh- explained to patient this is from a sensory nerve moved out of the way during surgery.   Assessment/Plan: 1 Day Post-Op Procedure(s) (LRB): RIGHT TOTAL HIP ARTHROPLASTY ANTERIOR APPROACH (Right) Advance diet Up with therapy D/C IV fluids Discharge home with home health today vs. Tomorrow depending on pain control and how well she mobilizes with PT WBAT RLE- no precautions ABLA- mild and stable D/c foley       Aundra Dubin 08/28/2018, 7:56 AM

## 2018-08-28 NOTE — TOC Transition Note (Signed)
Transition of Care [facility]) - CM/SW Discharge Note   Patient Details  Name: RICKIE GUTIERRES MRN: [id number] Date of Birth: 1950/09/15  Transition of Care [facility]) CM/SW Contact:  Lia Hopping, [place] Phone Number: 08/28/2018, 9:18 AM   Clinical Narrative:    Per Case Manager-Ortho Bundle patient with anticipated discharge needs after surgery for R-Ant. THA of HHPT as well as a FWW and 3 in 1 for home use. She has a brother that will be assisting her after discharge. [name] will follow during hospital stay for Ortho Bundle.   Final next level of care: Home w Home Health Services Barriers to Discharge: No Barriers Identified   Patient Goals and CMS Choice Patient states their goals for this hospitalization and ongoing recovery are:: Home       Discharge Placement  Home w/ Health                 Name of family member notified: Patient to notify when  medically ready to discharge.  Patient and family notified of of transfer: 08/28/18  Discharge Plan and Services                DME Arranged: 3-N-1, Walker rolling DME Agency: [employer]     Representative spoke with at DME Agency: (Prearranged by Orthopedics team. ) HH Arranged: PT [place]: [facility] (now [facility])        Social Determinants of Health (SDOH) Interventions     Readmission Risk Interventions No flowsheet data found.
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
## 2018-08-28 NOTE — Care Plan (Signed)
[name] spoke with patient via telephone call while still in hospital today. She reports she feels she is doing well after R-Ant THA done yesterday. Her DME has been delivered to her hospital room. She reports she has worked with therapy yesterday afternoon and today. She verbalized she most likely would want to go home today. Her Follow up with MD is scheduled for 09/11/18 in office and HHPT has been arranged. Instructed to work with therapy again this afternoon and a decision can be made later today. [name] will continue to follow at home once discharged.

## 2018-08-29 ENCOUNTER — Telehealth: Payer: Self-pay | Admitting: *Deleted

## 2018-08-29 DIAGNOSIS — F319 Bipolar disorder, unspecified: Secondary | ICD-10-CM | POA: Diagnosis not present

## 2018-08-29 DIAGNOSIS — F411 Generalized anxiety disorder: Secondary | ICD-10-CM | POA: Diagnosis not present

## 2018-08-29 NOTE — Anesthesia Postprocedure Evaluation (Signed)
Anesthesia Post Note  Patient: [name]  Procedure(s) Performed: RIGHT TOTAL HIP ARTHROPLASTY ANTERIOR APPROACH (Right Hip)     Patient location during evaluation: PACU Anesthesia Type: General Level of consciousness: awake and alert Pain management: pain level controlled Vital Signs Assessment: post-procedure vital signs reviewed and stable Respiratory status: spontaneous breathing, nonlabored ventilation, respiratory function stable and patient connected to nasal cannula oxygen Cardiovascular status: blood pressure returned to baseline and stable Postop Assessment: no apparent nausea or vomiting Anesthetic complications: no    Last Vitals:  Vitals:   08/28/18 0937 08/28/18 1348  BP: 98/61 102/67  Pulse: 82 84  Resp: 15 17  Temp: 36.8 C 36.7 C  SpO2: 94% 92%    Last Pain:  Vitals:   08/28/18 1348  TempSrc: Oral  PainSc:    Pain Goal: Patients Stated Pain Goal: 2 (08/28/18 0438)                 Barnet Glasgow

## 2018-08-29 NOTE — Telephone Encounter (Signed)
Ortho Bundle Call completed.

## 2018-08-29 NOTE — Care Plan (Signed)
[name] called patient at home, 1 day post-discharge. She reports she is doing well and had a good night overall. She is having pain and is using her RW. Informed that HHPT has been trying to reach her today. She confirmed she was having phone issues earlier in the morning and has been in contact with someone from [facility] this afternoon. They will be calling her to schedule for either today or tomorrow for start of home PT. Discussed her pain medications. She is taking Oxycodone as scheduled and is also using the muscle relaxer as prescribed. She also is taking Aspirin as prescribed by MD at discharge. Follow up remains scheduled for 09/11/18 at 1:45 pm with Dr. Erlinda Hong.

## 2018-08-30 ENCOUNTER — Telehealth: Payer: Self-pay | Admitting: *Deleted

## 2018-08-30 DIAGNOSIS — F329 Major depressive disorder, single episode, unspecified: Secondary | ICD-10-CM | POA: Diagnosis not present

## 2018-08-30 DIAGNOSIS — Z471 Aftercare following joint replacement surgery: Secondary | ICD-10-CM | POA: Diagnosis not present

## 2018-08-30 DIAGNOSIS — F17210 Nicotine dependence, cigarettes, uncomplicated: Secondary | ICD-10-CM | POA: Diagnosis not present

## 2018-08-30 DIAGNOSIS — G4700 Insomnia, unspecified: Secondary | ICD-10-CM | POA: Diagnosis not present

## 2018-08-30 DIAGNOSIS — G40909 Epilepsy, unspecified, not intractable, without status epilepticus: Secondary | ICD-10-CM | POA: Diagnosis not present

## 2018-08-30 DIAGNOSIS — J449 Chronic obstructive pulmonary disease, unspecified: Secondary | ICD-10-CM | POA: Diagnosis not present

## 2018-08-30 DIAGNOSIS — Z96641 Presence of right artificial hip joint: Secondary | ICD-10-CM | POA: Diagnosis not present

## 2018-08-30 DIAGNOSIS — K2270 Barrett's esophagus without dysplasia: Secondary | ICD-10-CM | POA: Diagnosis not present

## 2018-08-30 DIAGNOSIS — K219 Gastro-esophageal reflux disease without esophagitis: Secondary | ICD-10-CM | POA: Diagnosis not present

## 2018-08-30 DIAGNOSIS — E785 Hyperlipidemia, unspecified: Secondary | ICD-10-CM | POA: Diagnosis not present

## 2018-08-30 NOTE — Telephone Encounter (Signed)
Patient called asking if she can get a refill of her Oxycodone before the weekend. She is taking Oxycodone 5 mg 1-3 tablets/3 times daily as prescribed. She is also taking Oxycodone 10 mg every 12 hours for pain.

## 2018-08-31 ENCOUNTER — Telehealth: Payer: Self-pay | Admitting: Orthopaedic Surgery

## 2018-08-31 MED ORDER — OXYCODONE-ACETAMINOPHEN 5-325 MG PO TABS: 1.0000 | ORAL_TABLET | Freq: Four times a day (QID) | ORAL | 0 refills | Status: DC | PRN

## 2018-08-31 NOTE — Telephone Encounter (Signed)
Patient called needing Rx refilled (Oxycodone)5 mg and (Oxycodone) 10mg     The number to contact patient is [phone]

## 2018-08-31 NOTE — Telephone Encounter (Signed)
[name], will you let her know that I just sent in refill of percocet?  We are weaning to 1-2 q6-8 hours so please use sparingly

## 2018-08-31 NOTE — Telephone Encounter (Signed)
Patient aware of the below message  

## 2018-08-31 NOTE — Telephone Encounter (Signed)
Please advise 

## 2018-09-03 DIAGNOSIS — E785 Hyperlipidemia, unspecified: Secondary | ICD-10-CM | POA: Diagnosis not present

## 2018-09-03 DIAGNOSIS — Z96641 Presence of right artificial hip joint: Secondary | ICD-10-CM | POA: Diagnosis not present

## 2018-09-03 DIAGNOSIS — Z471 Aftercare following joint replacement surgery: Secondary | ICD-10-CM | POA: Diagnosis not present

## 2018-09-03 DIAGNOSIS — G40909 Epilepsy, unspecified, not intractable, without status epilepticus: Secondary | ICD-10-CM | POA: Diagnosis not present

## 2018-09-03 DIAGNOSIS — F329 Major depressive disorder, single episode, unspecified: Secondary | ICD-10-CM | POA: Diagnosis not present

## 2018-09-03 DIAGNOSIS — J449 Chronic obstructive pulmonary disease, unspecified: Secondary | ICD-10-CM | POA: Diagnosis not present

## 2018-09-05 ENCOUNTER — Telehealth: Payer: Self-pay | Admitting: *Deleted

## 2018-09-05 DIAGNOSIS — J449 Chronic obstructive pulmonary disease, unspecified: Secondary | ICD-10-CM | POA: Diagnosis not present

## 2018-09-05 DIAGNOSIS — Z96641 Presence of right artificial hip joint: Secondary | ICD-10-CM | POA: Diagnosis not present

## 2018-09-05 DIAGNOSIS — F329 Major depressive disorder, single episode, unspecified: Secondary | ICD-10-CM | POA: Diagnosis not present

## 2018-09-05 DIAGNOSIS — Z471 Aftercare following joint replacement surgery: Secondary | ICD-10-CM | POA: Diagnosis not present

## 2018-09-05 DIAGNOSIS — G40909 Epilepsy, unspecified, not intractable, without status epilepticus: Secondary | ICD-10-CM | POA: Diagnosis not present

## 2018-09-05 DIAGNOSIS — E785 Hyperlipidemia, unspecified: Secondary | ICD-10-CM | POA: Diagnosis not present

## 2018-09-05 NOTE — Care Plan (Signed)
[name] called patient to check on status at 1 week post-op. She reports she is doing well overall. She is working with HHPT and performing exercises as instructed. Still in quite a bit of pain post-surgery and able to get around her home slowly, but is able to care for herself. Reminded of appointment for follow up/post-surgery on 09/11/18 with Dr. Erlinda Hong.

## 2018-09-05 NOTE — Telephone Encounter (Signed)
Ortho Bundle 1 week phone call completed.

## 2018-09-09 DIAGNOSIS — F329 Major depressive disorder, single episode, unspecified: Secondary | ICD-10-CM | POA: Diagnosis not present

## 2018-09-09 DIAGNOSIS — Z471 Aftercare following joint replacement surgery: Secondary | ICD-10-CM | POA: Diagnosis not present

## 2018-09-09 DIAGNOSIS — G40909 Epilepsy, unspecified, not intractable, without status epilepticus: Secondary | ICD-10-CM | POA: Diagnosis not present

## 2018-09-09 DIAGNOSIS — E785 Hyperlipidemia, unspecified: Secondary | ICD-10-CM | POA: Diagnosis not present

## 2018-09-09 DIAGNOSIS — Z96641 Presence of right artificial hip joint: Secondary | ICD-10-CM | POA: Diagnosis not present

## 2018-09-09 DIAGNOSIS — J449 Chronic obstructive pulmonary disease, unspecified: Secondary | ICD-10-CM | POA: Diagnosis not present

## 2018-09-11 ENCOUNTER — Inpatient Hospital Stay: Payer: Medicare Other | Admitting: Orthopaedic Surgery

## 2018-09-11 ENCOUNTER — Encounter: Payer: Self-pay | Admitting: Orthopaedic Surgery

## 2018-09-11 ENCOUNTER — Other Ambulatory Visit: Payer: Self-pay

## 2018-09-11 ENCOUNTER — Ambulatory Visit (INDEPENDENT_AMBULATORY_CARE_PROVIDER_SITE_OTHER): Payer: Medicare Other | Admitting: Physician Assistant

## 2018-09-11 DIAGNOSIS — Z96641 Presence of right artificial hip joint: Secondary | ICD-10-CM

## 2018-09-11 MED ORDER — OXYCODONE-ACETAMINOPHEN 5-325 MG PO TABS: 1.0000 | ORAL_TABLET | Freq: Three times a day (TID) | ORAL | 0 refills | Status: DC | PRN

## 2018-09-11 NOTE — Care Plan (Signed)
[name] met with patient while in office today for her initial post-op appointment after surgery for the R-THA done on 08/27/18. She is ambulating well with a FWW and reports she is doing well, but is having continued moderate to severe pain. She has received 4 HHPT visits since d/c home after surgery and feels she needs more. Seen in office in conjunction with PA visit and an additional 2 weeks of HHPT ordered. Order given verbally to PT with [facility] staff member and also faxed to office. Incision looks good at today's visit and sutures removed/steri strips applied. Refill of pain medication called into patient's pharmacy by PA. F/U scheduled in 4 weeks on 10/09/18 at 1:45 pm with Dr. Erlinda Hong. Patient called back later in the day requesting what to do regarding a tooth that needs filling per her Dentist. Dr. Erlinda Hong would like for her to wait at least 1 month before proceeding with any dental work, but she should call back if the dental work needs to be done before then. Antibiotic prophylaxis will be needed for 2 years for all dental appointments per MD protocol. Patient is aware of how to contact [facility] for any further needs.
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
## 2018-09-13 DIAGNOSIS — Z471 Aftercare following joint replacement surgery: Secondary | ICD-10-CM | POA: Diagnosis not present

## 2018-09-13 DIAGNOSIS — G40909 Epilepsy, unspecified, not intractable, without status epilepticus: Secondary | ICD-10-CM | POA: Diagnosis not present

## 2018-09-13 DIAGNOSIS — F329 Major depressive disorder, single episode, unspecified: Secondary | ICD-10-CM | POA: Diagnosis not present

## 2018-09-13 DIAGNOSIS — J449 Chronic obstructive pulmonary disease, unspecified: Secondary | ICD-10-CM | POA: Diagnosis not present

## 2018-09-13 DIAGNOSIS — E785 Hyperlipidemia, unspecified: Secondary | ICD-10-CM | POA: Diagnosis not present

## 2018-09-13 DIAGNOSIS — Z96641 Presence of right artificial hip joint: Secondary | ICD-10-CM | POA: Diagnosis not present

## 2018-09-14 ENCOUNTER — Other Ambulatory Visit (INDEPENDENT_AMBULATORY_CARE_PROVIDER_SITE_OTHER): Payer: Self-pay | Admitting: Specialist

## 2018-09-14 ENCOUNTER — Other Ambulatory Visit: Payer: Self-pay | Admitting: Physician Assistant

## 2018-09-14 MED ORDER — OXYCODONE-ACETAMINOPHEN 5-325 MG PO TABS: 1.0000 | ORAL_TABLET | Freq: Two times a day (BID) | ORAL | 0 refills | Status: DC | PRN

## 2018-09-14 MED ORDER — OXYCODONE HCL 5 MG PO TABS: 5.0000 mg | ORAL_TABLET | Freq: Three times a day (TID) | ORAL | 0 refills | Status: DC | PRN

## 2018-09-14 NOTE — Progress Notes (Signed)
Call from answering service, patient requesting refill on her oxycodone Rx. Given #20 on Tuesday when at office appointment. She has a history of narcotic use in past. I initially checked [place] Controlled Substance, and sent escript to [facility] to renew, when entering information on Epic noted that Dwana Melena PA-C was on Epic using this patients file. I chatted with Dwana Melena PA-C and she had Already placed an order on this patient to renew her percocet. I called [facility]  And cancelled the prescription I called in. jen

## 2018-09-15 ENCOUNTER — Telehealth: Payer: Self-pay | Admitting: Adult Health

## 2018-09-15 NOTE — Telephone Encounter (Signed)
Pt was aware of Covid exposure, she is unable to get tested due to lack of transportation at this time. Number given for follow up.

## 2018-09-18 ENCOUNTER — Other Ambulatory Visit: Payer: Self-pay | Admitting: Physician Assistant

## 2018-09-18 DIAGNOSIS — J449 Chronic obstructive pulmonary disease, unspecified: Secondary | ICD-10-CM | POA: Diagnosis not present

## 2018-09-18 DIAGNOSIS — Z96641 Presence of right artificial hip joint: Secondary | ICD-10-CM | POA: Diagnosis not present

## 2018-09-18 DIAGNOSIS — G40909 Epilepsy, unspecified, not intractable, without status epilepticus: Secondary | ICD-10-CM | POA: Diagnosis not present

## 2018-09-18 DIAGNOSIS — Z471 Aftercare following joint replacement surgery: Secondary | ICD-10-CM | POA: Diagnosis not present

## 2018-09-18 DIAGNOSIS — E785 Hyperlipidemia, unspecified: Secondary | ICD-10-CM | POA: Diagnosis not present

## 2018-09-18 DIAGNOSIS — F329 Major depressive disorder, single episode, unspecified: Secondary | ICD-10-CM | POA: Diagnosis not present

## 2018-09-18 MED ORDER — HYDROCODONE-ACETAMINOPHEN 5-325 MG PO TABS: 1.0000 | ORAL_TABLET | Freq: Two times a day (BID) | ORAL | 0 refills | Status: DC | PRN

## 2018-09-18 MED ORDER — AMOXICILLIN 500 MG PO CAPS: ORAL_CAPSULE | ORAL | 1 refills | Status: DC

## 2018-09-20 DIAGNOSIS — F329 Major depressive disorder, single episode, unspecified: Secondary | ICD-10-CM | POA: Diagnosis not present

## 2018-09-20 DIAGNOSIS — Z471 Aftercare following joint replacement surgery: Secondary | ICD-10-CM | POA: Diagnosis not present

## 2018-09-20 DIAGNOSIS — G40909 Epilepsy, unspecified, not intractable, without status epilepticus: Secondary | ICD-10-CM | POA: Diagnosis not present

## 2018-09-20 DIAGNOSIS — E785 Hyperlipidemia, unspecified: Secondary | ICD-10-CM | POA: Diagnosis not present

## 2018-09-20 DIAGNOSIS — J449 Chronic obstructive pulmonary disease, unspecified: Secondary | ICD-10-CM | POA: Diagnosis not present

## 2018-09-20 DIAGNOSIS — Z96641 Presence of right artificial hip joint: Secondary | ICD-10-CM | POA: Diagnosis not present

## 2018-09-25 DIAGNOSIS — J449 Chronic obstructive pulmonary disease, unspecified: Secondary | ICD-10-CM | POA: Diagnosis not present

## 2018-09-25 DIAGNOSIS — Z96641 Presence of right artificial hip joint: Secondary | ICD-10-CM | POA: Diagnosis not present

## 2018-09-25 DIAGNOSIS — G40909 Epilepsy, unspecified, not intractable, without status epilepticus: Secondary | ICD-10-CM | POA: Diagnosis not present

## 2018-09-25 DIAGNOSIS — F329 Major depressive disorder, single episode, unspecified: Secondary | ICD-10-CM | POA: Diagnosis not present

## 2018-09-25 DIAGNOSIS — E785 Hyperlipidemia, unspecified: Secondary | ICD-10-CM | POA: Diagnosis not present

## 2018-09-25 DIAGNOSIS — Z471 Aftercare following joint replacement surgery: Secondary | ICD-10-CM | POA: Diagnosis not present

## 2018-09-26 ENCOUNTER — Other Ambulatory Visit: Payer: Self-pay | Admitting: Family Medicine

## 2018-09-26 ENCOUNTER — Ambulatory Visit: Payer: Medicare Other | Admitting: Orthopaedic Surgery

## 2018-09-26 ENCOUNTER — Telehealth: Payer: Self-pay | Admitting: *Deleted

## 2018-09-26 DIAGNOSIS — L299 Pruritus, unspecified: Secondary | ICD-10-CM

## 2018-09-26 NOTE — Telephone Encounter (Signed)
Ortho Bundle CM call to discuss questions.
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
## 2018-09-27 DIAGNOSIS — F329 Major depressive disorder, single episode, unspecified: Secondary | ICD-10-CM | POA: Diagnosis not present

## 2018-09-27 DIAGNOSIS — G40909 Epilepsy, unspecified, not intractable, without status epilepticus: Secondary | ICD-10-CM | POA: Diagnosis not present

## 2018-09-27 DIAGNOSIS — Z471 Aftercare following joint replacement surgery: Secondary | ICD-10-CM | POA: Diagnosis not present

## 2018-09-27 DIAGNOSIS — Z96641 Presence of right artificial hip joint: Secondary | ICD-10-CM | POA: Diagnosis not present

## 2018-09-27 DIAGNOSIS — J449 Chronic obstructive pulmonary disease, unspecified: Secondary | ICD-10-CM | POA: Diagnosis not present

## 2018-09-27 DIAGNOSIS — E785 Hyperlipidemia, unspecified: Secondary | ICD-10-CM | POA: Diagnosis not present

## 2018-09-28 ENCOUNTER — Telehealth: Payer: Self-pay | Admitting: *Deleted

## 2018-09-28 NOTE — Telephone Encounter (Signed)
[name] Ortho Bundle 30 day call and survey completed. See care plan note as the patient satisfaction survey has recently changed.

## 2018-09-28 NOTE — Care Plan (Signed)
[name] made 30 day phone call and completed patient satisfaction survey that was provided by [facility]. This has been changed from what is currently in Epic/CHL. The following is survey and answers based on questions provided to the patient: 1. Before surgery, I was provided sufficient education regarding my surgery and the bundle program. Strongly Agree 2. I was satisfied with the care provided by the nurse at the facility where my surgery was performed. Strongly Agree 3. Following surgery, I received sufficient postoperative care instructions.  Strongly Agree 4. I would recommend my surgeon and this bundle program to others. Strongly Agree *Patient aware of how to reach [facility] for further questions and needs.

## 2018-10-09 ENCOUNTER — Ambulatory Visit: Payer: Medicare Other | Admitting: Orthopedic Surgery

## 2018-10-09 ENCOUNTER — Ambulatory Visit: Payer: Medicare Other | Admitting: Orthopaedic Surgery

## 2018-10-16 ENCOUNTER — Ambulatory Visit (INDEPENDENT_AMBULATORY_CARE_PROVIDER_SITE_OTHER): Payer: Medicare Other

## 2018-10-16 ENCOUNTER — Other Ambulatory Visit: Payer: Self-pay

## 2018-10-16 ENCOUNTER — Ambulatory Visit (INDEPENDENT_AMBULATORY_CARE_PROVIDER_SITE_OTHER): Payer: Medicare Other | Admitting: Orthopaedic Surgery

## 2018-10-16 DIAGNOSIS — Z96641 Presence of right artificial hip joint: Secondary | ICD-10-CM

## 2018-10-16 NOTE — Care Plan (Signed)
[name] met with patient during her 6 week follow up appointment with Dr. Erlinda Hong. She is 7 weeks from surgery for a Right total hip replacement. She ambulates into office today without the use of an assistive device. She verbalized she is doing very well. She has completed HHPT and verbalized she was very pleased with her progress. She is doing home exercises and currently not prescribed any pain medication. She discussed today wanting to have surgery for the left hip as well. Will discuss with MD at future visit in 6 weeks. Follow up in 6 weeks per Dr. Erlinda Hong for her 3 month post-surgery appointment. Patient is aware of how to reach [facility] for any further questions or needs.
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
## 2018-10-17 ENCOUNTER — Telehealth: Payer: Self-pay | Admitting: Family Medicine

## 2018-10-17 NOTE — Telephone Encounter (Signed)
[facility] is calling because the pt is requesting a 30 day supply instead of the 10 day that was sent. Pharmacy fax: [phone]

## 2018-10-17 NOTE — Telephone Encounter (Signed)
Will route to pharmacy

## 2018-10-23 ENCOUNTER — Telehealth: Payer: Self-pay | Admitting: Family Medicine

## 2018-10-23 NOTE — Telephone Encounter (Signed)
Patient was called and given information to get COVID testing.

## 2018-10-23 NOTE — Telephone Encounter (Signed)
Patient called stating she would like to get orders for covid testing. Please follow up

## 2018-10-24 ENCOUNTER — Other Ambulatory Visit: Payer: Self-pay | Admitting: *Deleted

## 2018-10-24 ENCOUNTER — Telehealth: Payer: Self-pay | Admitting: *Deleted

## 2018-10-24 DIAGNOSIS — Z20822 Contact with and (suspected) exposure to covid-19: Secondary | ICD-10-CM

## 2018-10-24 NOTE — Telephone Encounter (Signed)
Referred by [facility] Dr. Margarita Rana due to upcoming dental work. Appointment made at the [facility] site for 7/14 at 1:30p. Informed to wear mask. She will be using transport service [facility].

## 2018-10-28 ENCOUNTER — Other Ambulatory Visit (INDEPENDENT_AMBULATORY_CARE_PROVIDER_SITE_OTHER): Payer: Self-pay | Admitting: Orthopaedic Surgery

## 2018-10-29 ENCOUNTER — Ambulatory Visit: Payer: Medicare Other | Attending: Family Medicine | Admitting: Family Medicine

## 2018-10-29 ENCOUNTER — Encounter: Payer: Self-pay | Admitting: Family Medicine

## 2018-10-29 ENCOUNTER — Other Ambulatory Visit: Payer: Self-pay

## 2018-10-29 DIAGNOSIS — F331 Major depressive disorder, recurrent, moderate: Secondary | ICD-10-CM | POA: Diagnosis not present

## 2018-10-29 DIAGNOSIS — Z79899 Other long term (current) drug therapy: Secondary | ICD-10-CM

## 2018-10-29 DIAGNOSIS — L989 Disorder of the skin and subcutaneous tissue, unspecified: Secondary | ICD-10-CM | POA: Diagnosis not present

## 2018-10-29 DIAGNOSIS — R0981 Nasal congestion: Secondary | ICD-10-CM

## 2018-10-29 DIAGNOSIS — E782 Mixed hyperlipidemia: Secondary | ICD-10-CM | POA: Diagnosis not present

## 2018-10-29 DIAGNOSIS — R42 Dizziness and giddiness: Secondary | ICD-10-CM

## 2018-10-29 MED ORDER — MECLIZINE HCL 12.5 MG PO TABS: 12.5000 mg | ORAL_TABLET | Freq: Three times a day (TID) | ORAL | 0 refills | Status: DC | PRN
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
## 2018-10-29 NOTE — Telephone Encounter (Signed)
Ok to rf? 

## 2018-10-29 NOTE — Telephone Encounter (Signed)
Ok

## 2018-10-29 NOTE — Progress Notes (Signed)
Per pt [facility] do not have a psch. and need referral

## 2018-10-30 ENCOUNTER — Telehealth: Payer: Self-pay | Admitting: Family Medicine

## 2018-10-30 ENCOUNTER — Other Ambulatory Visit: Payer: Medicare Other

## 2018-10-30 DIAGNOSIS — R6889 Other general symptoms and signs: Secondary | ICD-10-CM | POA: Diagnosis not present

## 2018-10-30 DIAGNOSIS — Z20822 Contact with and (suspected) exposure to covid-19: Secondary | ICD-10-CM

## 2018-10-30 NOTE — Telephone Encounter (Signed)
Pt called checking status of COVID test. Please call pt with results. Pt also request provider contact her dentist Darel Hong with results so she can schedule her appointment with him as quickly as possible.

## 2018-10-30 NOTE — Telephone Encounter (Signed)
Patient was called and informed that someone will contact her with results of covid test.

## 2018-11-02 LAB — NOVEL CORONAVIRUS, NAA: SARS-CoV-2, NAA: NOT DETECTED

## 2018-11-06 ENCOUNTER — Other Ambulatory Visit: Payer: Medicare Other

## 2018-11-06 ENCOUNTER — Ambulatory Visit: Payer: Medicare Other

## 2018-11-09 ENCOUNTER — Encounter: Payer: Self-pay | Admitting: Family Medicine

## 2018-11-15 ENCOUNTER — Telehealth: Payer: Self-pay | Admitting: Family Medicine

## 2018-11-15 NOTE — Telephone Encounter (Signed)
Pt called for an update on her psychiatrist referral, to receive sleep medications please follow up

## 2018-11-15 NOTE — Telephone Encounter (Signed)
Will route to referral coordinator

## 2018-11-20 NOTE — Telephone Encounter (Signed)
What do I need to do to make sure that she gets a referral to her mental health provider of choice? Can you contact patient and make the referral since a referral has already been placed

## 2018-11-20 NOTE — Telephone Encounter (Signed)
I called  [facility] and they don't have her referral  .  I can see the referral to [facility] they go straight to the clinic  From provider .

## 2018-11-22 ENCOUNTER — Other Ambulatory Visit: Payer: Self-pay | Admitting: Family Medicine

## 2018-11-22 DIAGNOSIS — L299 Pruritus, unspecified: Secondary | ICD-10-CM

## 2018-11-23 NOTE — Telephone Encounter (Signed)
Good Afternoon  Dr Chapman Fitch  I contacted Mrs  Mallette ans she don't have a specific psychiatric. Patient is aware   Sent referral to Dr Corena [facility]  Ph. [place] # [phone] Address [address], [place], [place] [address] They will contact the patient to schedule an appointment

## 2018-11-23 NOTE — Telephone Encounter (Signed)
Thank you :)

## 2018-11-27 ENCOUNTER — Ambulatory Visit: Payer: Medicare Other | Admitting: Family Medicine

## 2018-11-27 ENCOUNTER — Ambulatory Visit: Payer: Medicare Other | Admitting: Orthopaedic Surgery

## 2018-12-03 ENCOUNTER — Other Ambulatory Visit: Payer: Self-pay

## 2018-12-03 ENCOUNTER — Ambulatory Visit: Payer: Medicare Other | Attending: Family Medicine | Admitting: Family Medicine

## 2018-12-03 ENCOUNTER — Telehealth: Payer: Self-pay | Admitting: *Deleted

## 2018-12-03 DIAGNOSIS — R42 Dizziness and giddiness: Secondary | ICD-10-CM | POA: Diagnosis not present

## 2018-12-03 DIAGNOSIS — R143 Flatulence: Secondary | ICD-10-CM | POA: Diagnosis not present

## 2018-12-03 MED ORDER — MECLIZINE HCL 25 MG PO TABS: 25.0000 mg | ORAL_TABLET | Freq: Three times a day (TID) | ORAL | 0 refills | Status: DC | PRN

## 2018-12-03 MED ORDER — SIMETHICONE 80 MG PO CHEW: 80.0000 mg | CHEWABLE_TABLET | Freq: Four times a day (QID) | ORAL | 1 refills | Status: DC | PRN
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
## 2018-12-03 NOTE — Progress Notes (Signed)
Patient has been called and DOB has been verified. Patient has been screened and transferred to PCP to start phone visit.   Patient states that she has been having bad gas.  Meclizine is not working.

## 2018-12-03 NOTE — Care Plan (Signed)
[name] made call to patient to check status for 90 day phone call. She had a R-THA done by Dr. Erlinda Hong on 08/27/2018 and states she is ready to have her Left hip done also. She has a scheduled appointment with Dr. Erlinda Hong to discuss this week. Reviewed 90 day [employer] Survey. Overall, patient states she is pleased with having her surgery done.

## 2018-12-03 NOTE — Telephone Encounter (Signed)
Ortho bundle 90 day call and [name]. Survey completed.

## 2018-12-04 DIAGNOSIS — Z961 Presence of intraocular lens: Secondary | ICD-10-CM | POA: Diagnosis not present

## 2018-12-04 DIAGNOSIS — H26493 Other secondary cataract, bilateral: Secondary | ICD-10-CM | POA: Diagnosis not present

## 2018-12-04 DIAGNOSIS — H0102B Squamous blepharitis left eye, upper and lower eyelids: Secondary | ICD-10-CM | POA: Diagnosis not present

## 2018-12-04 DIAGNOSIS — H52223 Regular astigmatism, bilateral: Secondary | ICD-10-CM | POA: Diagnosis not present

## 2018-12-04 DIAGNOSIS — H524 Presbyopia: Secondary | ICD-10-CM | POA: Diagnosis not present

## 2018-12-04 DIAGNOSIS — H0102A Squamous blepharitis right eye, upper and lower eyelids: Secondary | ICD-10-CM | POA: Diagnosis not present

## 2018-12-04 DIAGNOSIS — H04123 Dry eye syndrome of bilateral lacrimal glands: Secondary | ICD-10-CM | POA: Diagnosis not present

## 2018-12-04 DIAGNOSIS — H5213 Myopia, bilateral: Secondary | ICD-10-CM | POA: Diagnosis not present

## 2018-12-05 ENCOUNTER — Ambulatory Visit: Payer: Medicare Other | Admitting: Orthopaedic Surgery

## 2018-12-12 ENCOUNTER — Other Ambulatory Visit: Payer: Medicare Other

## 2018-12-12 ENCOUNTER — Ambulatory Visit (INDEPENDENT_AMBULATORY_CARE_PROVIDER_SITE_OTHER): Payer: Medicare Other

## 2018-12-12 ENCOUNTER — Ambulatory Visit (INDEPENDENT_AMBULATORY_CARE_PROVIDER_SITE_OTHER): Payer: Medicare Other | Admitting: Orthopaedic Surgery

## 2018-12-12 ENCOUNTER — Other Ambulatory Visit: Payer: Self-pay | Admitting: Orthopaedic Surgery

## 2018-12-12 DIAGNOSIS — Z96641 Presence of right artificial hip joint: Secondary | ICD-10-CM

## 2018-12-12 DIAGNOSIS — M1612 Unilateral primary osteoarthritis, left hip: Secondary | ICD-10-CM | POA: Diagnosis not present

## 2018-12-12 DIAGNOSIS — M7062 Trochanteric bursitis, left hip: Secondary | ICD-10-CM | POA: Diagnosis not present

## 2018-12-12 MED ORDER — TRAMADOL HCL 50 MG PO TABS: 50.0000 mg | ORAL_TABLET | Freq: Every day | ORAL | 2 refills | Status: DC | PRN

## 2018-12-12 MED ORDER — CELECOXIB 200 MG PO CAPS: 200.0000 mg | ORAL_CAPSULE | Freq: Two times a day (BID) | ORAL | 3 refills | Status: DC

## 2018-12-12 MED ORDER — PREDNISONE 10 MG (21) PO TBPK: ORAL_TABLET | ORAL | 0 refills | Status: DC
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
## 2018-12-18 ENCOUNTER — Other Ambulatory Visit: Payer: Self-pay | Admitting: Family Medicine

## 2018-12-18 DIAGNOSIS — L720 Epidermal cyst: Secondary | ICD-10-CM | POA: Diagnosis not present

## 2018-12-18 DIAGNOSIS — L821 Other seborrheic keratosis: Secondary | ICD-10-CM | POA: Diagnosis not present

## 2018-12-18 DIAGNOSIS — E782 Mixed hyperlipidemia: Secondary | ICD-10-CM

## 2018-12-18 DIAGNOSIS — D1801 Hemangioma of skin and subcutaneous tissue: Secondary | ICD-10-CM | POA: Diagnosis not present

## 2018-12-23 ENCOUNTER — Other Ambulatory Visit: Payer: Self-pay | Admitting: Family Medicine

## 2018-12-23 DIAGNOSIS — M25551 Pain in right hip: Secondary | ICD-10-CM

## 2018-12-25 DIAGNOSIS — J342 Deviated nasal septum: Secondary | ICD-10-CM | POA: Diagnosis not present

## 2018-12-25 DIAGNOSIS — J324 Chronic pansinusitis: Secondary | ICD-10-CM | POA: Diagnosis not present

## 2018-12-25 DIAGNOSIS — H7490 Unspecified disorder of middle ear and mastoid, unspecified ear: Secondary | ICD-10-CM | POA: Diagnosis not present

## 2018-12-26 ENCOUNTER — Other Ambulatory Visit: Payer: Medicare Other

## 2019-01-02 ENCOUNTER — Ambulatory Visit: Payer: Medicare Other | Admitting: Adult Health

## 2019-01-03 ENCOUNTER — Other Ambulatory Visit: Payer: Self-pay

## 2019-01-03 ENCOUNTER — Encounter: Payer: Self-pay | Admitting: Adult Health

## 2019-01-03 ENCOUNTER — Ambulatory Visit (INDEPENDENT_AMBULATORY_CARE_PROVIDER_SITE_OTHER): Payer: Medicare Other | Admitting: Adult Health

## 2019-01-03 ENCOUNTER — Telehealth: Payer: Self-pay | Admitting: *Deleted

## 2019-01-03 VITALS — BP 115/76 | HR 71 | Temp 98.4°F | Ht 66.0 in | Wt 137.2 lb

## 2019-01-03 DIAGNOSIS — R569 Unspecified convulsions: Secondary | ICD-10-CM

## 2019-01-03 DIAGNOSIS — G4700 Insomnia, unspecified: Secondary | ICD-10-CM | POA: Diagnosis not present

## 2019-01-03 NOTE — Telephone Encounter (Signed)
Patient is S/P THA on 08/27/18- she called requesting refill of Tramadol. Currently taking 50 mg bid. Thank you!
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
## 2019-01-03 NOTE — Patient Instructions (Signed)
Your Plan:  Continue [place]  Blood work today If your symptoms worsen or you develop new symptoms please let us know.    Thank you for coming to see [place] at [facility]. I hope we have been able to provide you high quality care today.  You may receive a patient satisfaction survey over the next few weeks. We would appreciate your feedback and comments so that we may continue to improve ourselves and the health of our patients.

## 2019-01-04 NOTE — Telephone Encounter (Signed)
Just saw in her chart where she was seen yesterday for seizures.  Tramadol lowers the seizure threshold.  No more refills from [place] on this or any other pain meds

## 2019-01-15 ENCOUNTER — Other Ambulatory Visit: Payer: Self-pay

## 2019-01-15 ENCOUNTER — Ambulatory Visit
Admission: RE | Admit: 2019-01-15 | Discharge: 2019-01-15 | Disposition: A | Discharge status: Home / Self Care | Payer: Medicare Other | Source: Ambulatory Visit | Attending: Family Medicine | Admitting: Family Medicine

## 2019-01-15 DIAGNOSIS — M8589 Other specified disorders of bone density and structure, multiple sites: Secondary | ICD-10-CM | POA: Diagnosis not present

## 2019-01-15 DIAGNOSIS — Z780 Asymptomatic menopausal state: Secondary | ICD-10-CM | POA: Diagnosis not present

## 2019-01-15 DIAGNOSIS — Z1231 Encounter for screening mammogram for malignant neoplasm of breast: Secondary | ICD-10-CM

## 2019-01-15 DIAGNOSIS — E2839 Other primary ovarian failure: Secondary | ICD-10-CM

## 2019-01-17 ENCOUNTER — Other Ambulatory Visit: Payer: Self-pay | Admitting: Family Medicine

## 2019-01-17 ENCOUNTER — Other Ambulatory Visit: Payer: Self-pay | Admitting: Diagnostic Neuroimaging

## 2019-01-17 DIAGNOSIS — E782 Mixed hyperlipidemia: Secondary | ICD-10-CM

## 2019-01-18 ENCOUNTER — Telehealth: Payer: Self-pay

## 2019-01-18 ENCOUNTER — Telehealth: Payer: Self-pay | Admitting: Neurology

## 2019-01-18 ENCOUNTER — Telehealth: Payer: Self-pay | Admitting: Family Medicine

## 2019-01-18 MED ORDER — LAMOTRIGINE 100 MG PO TABS: ORAL_TABLET | ORAL | 12 refills | Status: DC

## 2019-01-18 NOTE — Telephone Encounter (Signed)
Patient was given lab appointment for medication refill.

## 2019-01-18 NOTE — Telephone Encounter (Signed)
1) Medication(s) Requested (by name): simvastatin 2) Pharmacy of Choice:

## 2019-01-18 NOTE — Telephone Encounter (Signed)
Patient name and DOB has been verified Patient was informed of lab results. Patient had no questions.  

## 2019-01-18 NOTE — Telephone Encounter (Signed)
-----   Message from Charlott Rakes, MD sent at 01/15/2019  4:59 PM EDT ----- Bone density reveals early thinning of the bones but no osteoporosis.  Encouraged to increase intake of calcium and vitamin D.

## 2019-01-18 NOTE — Telephone Encounter (Signed)
1510:   Answering service test.  Asked to call about Lamictal script.   I called and got VM and left a message  1520:   Got VM again.   I let her know that iooks like lamotrigine was sent in yesterday.   I will resend it to the [facility] (where it was sent last time)

## 2019-01-18 NOTE — Telephone Encounter (Signed)
-----   Message from Charlott Rakes, MD sent at 01/15/2019  4:58 PM EDT ----- Your mammogram is negative for malignancy

## 2019-01-21 ENCOUNTER — Telehealth: Payer: Self-pay | Admitting: Family Medicine

## 2019-01-21 NOTE — Telephone Encounter (Signed)
Patient states that she received her results in the mail for her mammogram and is very concerned about the results. Patient is wanting a call from the PCP not the nurse.

## 2019-01-21 NOTE — Telephone Encounter (Signed)
Patient was called and given her results but patient states that she received her results from the breast center via mail and it states that she has denced breast and patient would like further explanation of what to expect from having denced breast.

## 2019-01-21 NOTE — Telephone Encounter (Signed)
Patient called stating she received her mammogram results in the mail and she has some concerns regarding her report. Patient states she is upset and would like to speak with the nurse. Please f/u

## 2019-01-21 NOTE — Telephone Encounter (Signed)
I sent her this my chart message below: Notes recorded by Charlott Rakes, MD on 01/15/2019 at 4:58 PM EDT  Your mammogram is negative for malignancy

## 2019-01-22 NOTE — Telephone Encounter (Signed)
I called her and explained to her on the phone that her mammogram revealed no evidence of malignancy and confirmed the presence of dense breast tissue on her mammogram report.  She has been advised to call the breast center for further clarification if she is still confused.

## 2019-01-23 ENCOUNTER — Other Ambulatory Visit: Payer: Medicare Other

## 2019-01-29 ENCOUNTER — Telehealth: Payer: Self-pay | Admitting: *Deleted

## 2019-01-29 DIAGNOSIS — H16223 Keratoconjunctivitis sicca, not specified as Sjogren's, bilateral: Secondary | ICD-10-CM | POA: Diagnosis not present

## 2019-01-29 DIAGNOSIS — H0102A Squamous blepharitis right eye, upper and lower eyelids: Secondary | ICD-10-CM | POA: Diagnosis not present

## 2019-01-29 DIAGNOSIS — Z961 Presence of intraocular lens: Secondary | ICD-10-CM | POA: Diagnosis not present

## 2019-01-29 DIAGNOSIS — H0102B Squamous blepharitis left eye, upper and lower eyelids: Secondary | ICD-10-CM | POA: Diagnosis not present

## 2019-01-29 DIAGNOSIS — H538 Other visual disturbances: Secondary | ICD-10-CM | POA: Diagnosis not present

## 2019-01-29 DIAGNOSIS — H26493 Other secondary cataract, bilateral: Secondary | ICD-10-CM | POA: Diagnosis not present

## 2019-01-29 NOTE — Care Plan (Signed)
[name] received multiple voice messages from patient over last several days wanting to discuss possible surgery on her Left hip with Dr. Erlinda Hong. She verbalized she is limping, but other symptoms have not changed. Discussed with PA and made patient aware that all that could be offered at this time is a f/u with Dr. Erlinda Hong to discuss options for her other hip. She is s/p 5 months from her previous Right THA. Appointment scheduled for 02/12/19 at 2:30 pm. The [facility] [facility] [employer] staff confirmed with patient this time/date are agreeable.

## 2019-01-29 NOTE — Telephone Encounter (Signed)
Call for scheduling completed.

## 2019-01-30 ENCOUNTER — Other Ambulatory Visit: Payer: Medicare Other

## 2019-02-01 DIAGNOSIS — F3489 Other specified persistent mood disorders: Secondary | ICD-10-CM | POA: Diagnosis not present

## 2019-02-01 DIAGNOSIS — F411 Generalized anxiety disorder: Secondary | ICD-10-CM | POA: Diagnosis not present

## 2019-02-01 DIAGNOSIS — F332 Major depressive disorder, recurrent severe without psychotic features: Secondary | ICD-10-CM | POA: Diagnosis not present

## 2019-02-04 ENCOUNTER — Ambulatory Visit: Payer: Medicare Other | Admitting: Family Medicine

## 2019-02-11 NOTE — Progress Notes (Signed)
I reviewed note and agree with plan.   Penni Bombard, MD [id number], 0000000 AM Certified in Neurology, Neurophysiology and Neuroimaging  [facility] [address] [place], [place] [address] [phone]

## 2019-02-12 ENCOUNTER — Ambulatory Visit: Payer: Medicare Other | Admitting: Orthopaedic Surgery

## 2019-02-20 ENCOUNTER — Ambulatory Visit (INDEPENDENT_AMBULATORY_CARE_PROVIDER_SITE_OTHER): Payer: Medicare Other

## 2019-02-20 ENCOUNTER — Ambulatory Visit (INDEPENDENT_AMBULATORY_CARE_PROVIDER_SITE_OTHER): Payer: Medicare Other | Admitting: Orthopaedic Surgery

## 2019-02-20 ENCOUNTER — Encounter: Payer: Self-pay | Admitting: Orthopaedic Surgery

## 2019-02-20 DIAGNOSIS — Z96641 Presence of right artificial hip joint: Secondary | ICD-10-CM

## 2019-02-20 DIAGNOSIS — M1612 Unilateral primary osteoarthritis, left hip: Secondary | ICD-10-CM | POA: Diagnosis not present
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
## 2019-02-25 DIAGNOSIS — J329 Chronic sinusitis, unspecified: Secondary | ICD-10-CM | POA: Diagnosis not present

## 2019-02-25 DIAGNOSIS — J324 Chronic pansinusitis: Secondary | ICD-10-CM | POA: Diagnosis not present

## 2019-02-25 NOTE — Progress Notes (Signed)
[facility] [place], [place] [address] Phone: [phone] Fax: [place], [place] AT [address] [place] [address] Phone: [phone] Fax: [phone]  [id number] [address] [place] [address] Phone: [phone] Fax: [place], [place] [address] [place] [address] Phone: [phone] Fax: [phone]      Your procedure is scheduled on Monday, November 16th, 2020.   Report to [facility] Main Entrance "A" at 11:00 A.M., and check in at the Admitting office.   Call this number if you have problems the morning of surgery:  [phone]  Call [phone] if you have any questions prior to your surgery date Monday-Friday 8am-4pm    Remember:  Do not eat after midnight the night before your surgery  You may drink clear liquids until 10:00AM the morning of your surgery.    Clear liquids allowed are: Water, Non-Citrus Juices (without pulp), Carbonated Beverages, Clear Tea, Black Coffee Only, and Gatorade   Please complete your PRE-SURGERY ENSURE that was provided to you by 10:00AM the morning of surgery.  Please, if able, drink it in one setting. DO NOT SIP.   Take these medicines the morning of surgery with A SIP OF WATER :  Buspirone (Buspar) Oxymetazoline (Afrin) nasal spray) - if needed  7 days prior to surgery STOP taking any Aspirin (unless otherwise instructed by your surgeon), Aleve, Naproxen, Ibuprofen, Motrin, Advil, Goody's, BC's, all herbal medications, fish oil, and all vitamins.    The Morning of Surgery  Do not wear jewelry, make-up or nail polish.  Do not wear lotions, powders, or perfumes/colognes, or deodorant  Do not shave 48 hours  prior to surgery.  Men may shave face and neck.  Do not bring valuables to the hospital.  [facility] is not responsible for any belongings or valuables.  If you are a smoker, DO NOT Smoke 24 hours prior to surgery IF you wear a CPAP at night please bring your mask, tubing, and machine the morning of surgery   Remember that you must have someone to transport you home after your surgery, and remain with you for 24 hours if you are discharged the same day.   Contacts, glasses, hearing aids, dentures or bridgework may not be worn into surgery.    Leave your suitcase in the car.  After surgery it may be brought to your room.  For patients admitted to the hospital, discharge time will be determined by your treatment team.  Patients discharged the day of surgery will not be allowed to drive home.    Special instructions:   [place]- Preparing For Surgery  Before surgery, you can play an important role. Because skin is not sterile, your skin needs to be as free of germs as possible. You can reduce the number of germs on your skin by washing with CHG (chlorahexidine gluconate) Soap before surgery.  CHG is an antiseptic cleaner which kills germs and bonds with the skin to continue killing germs even after washing.    Oral Hygiene is also important to reduce your risk of infection.  Remember - BRUSH YOUR TEETH THE MORNING OF SURGERY WITH YOUR REGULAR TOOTHPASTE  Please do not use if you have an  allergy to CHG or antibacterial soaps. If your skin becomes reddened/irritated stop using the CHG.  Do not shave (including legs and underarms) for at least 48 hours prior to first CHG shower. It is OK to shave your face.  Please follow these instructions carefully.   1. Shower the NIGHT BEFORE SURGERY and the MORNING OF SURGERY with CHG Soap.   2. If you chose to wash your hair, wash your hair first as usual with your normal shampoo.  3. After you shampoo, rinse your hair and body thoroughly to  remove the shampoo.  4. Use CHG as you would any other liquid soap. You can apply CHG directly to the skin and wash gently with a scrungie or a clean washcloth.   5. Apply the CHG Soap to your body ONLY FROM THE NECK DOWN.  Do not use on open wounds or open sores. Avoid contact with your eyes, ears, mouth and genitals (private parts). Wash Face and genitals (private parts)  with your normal soap.   6. Wash thoroughly, paying special attention to the area where your surgery will be performed.  7. Thoroughly rinse your body with warm water from the neck down.  8. DO NOT shower/wash with your normal soap after using and rinsing off the CHG Soap.  9. Pat yourself dry with a CLEAN TOWEL.  10. Wear CLEAN PAJAMAS to bed the night before surgery, wear comfortable clothes the morning of surgery  11. Place CLEAN SHEETS on your bed the night of your first shower and DO NOT SLEEP WITH PETS.    Day of Surgery:  Do not apply any deodorants/lotions. Please shower the morning of surgery with the CHG soap  Please wear clean clothes to the hospital/surgery center.   Remember to brush your teeth WITH YOUR REGULAR TOOTHPASTE.   Please read over the following fact sheets that you were given.

## 2019-02-26 ENCOUNTER — Other Ambulatory Visit: Payer: Self-pay

## 2019-02-26 ENCOUNTER — Encounter (HOSPITAL_COMMUNITY)
Admission: RE | Admit: 2019-02-26 | Discharge: 2019-02-26 | Disposition: A | Discharge status: Home / Self Care | Payer: Medicare Other | Source: Ambulatory Visit | Attending: Orthopaedic Surgery | Admitting: Orthopaedic Surgery

## 2019-02-26 ENCOUNTER — Encounter (HOSPITAL_COMMUNITY): Payer: Self-pay

## 2019-02-26 DIAGNOSIS — Z01812 Encounter for preprocedural laboratory examination: Secondary | ICD-10-CM | POA: Insufficient documentation

## 2019-02-26 LAB — CBC WITH DIFFERENTIAL/PLATELET
Abs Immature Granulocytes: 0.03 10*3/uL (ref 0.00–0.07)
Basophils Absolute: 0.1 10*3/uL (ref 0.0–0.1)
Basophils Relative: 1 %
Eosinophils Absolute: 0.4 10*3/uL (ref 0.0–0.5)
Eosinophils Relative: 5 %
HCT: 47 % — ABNORMAL HIGH (ref 36.0–46.0)
Hemoglobin: 15.9 g/dL — ABNORMAL HIGH (ref 12.0–15.0)
Immature Granulocytes: 0 %
Lymphocytes Relative: 37 %
Lymphs Abs: 3.1 10*3/uL (ref 0.7–4.0)
MCH: 32.5 pg (ref 26.0–34.0)
MCHC: 33.8 g/dL (ref 30.0–36.0)
MCV: 96.1 fL (ref 80.0–100.0)
Monocytes Absolute: 0.7 10*3/uL (ref 0.1–1.0)
Monocytes Relative: 8 %
Neutro Abs: 4.1 10*3/uL (ref 1.7–7.7)
Neutrophils Relative %: 49 %
Platelets: 319 10*3/uL (ref 150–400)
RBC: 4.89 MIL/uL (ref 3.87–5.11)
RDW: 13.5 % (ref 11.5–15.5)
WBC: 8.4 10*3/uL (ref 4.0–10.5)
nRBC: 0 % (ref 0.0–0.2)

## 2019-02-26 LAB — PROTIME-INR
INR: 0.9 (ref 0.8–1.2)
Prothrombin Time: 12.5 s (ref 11.4–15.2)

## 2019-02-26 LAB — COMPREHENSIVE METABOLIC PANEL WITH GFR
ALT: 18 U/L (ref 0–44)
AST: 19 U/L (ref 15–41)
Albumin: 4.2 g/dL (ref 3.5–5.0)
Alkaline Phosphatase: 83 U/L (ref 38–126)
Anion gap: 11 (ref 5–15)
BUN: 20 mg/dL (ref 8–23)
CO2: 20 mmol/L — ABNORMAL LOW (ref 22–32)
Calcium: 9.4 mg/dL (ref 8.9–10.3)
Chloride: 107 mmol/L (ref 98–111)
Creatinine, Ser: 0.86 mg/dL (ref 0.44–1.00)
GFR calc Af Amer: >60 mL/min
GFR calc non Af Amer: >60 mL/min
Glucose, Bld: 86 mg/dL (ref 70–99)
Potassium: 4.4 mmol/L (ref 3.5–5.1)
Sodium: 138 mmol/L (ref 135–145)
Total Bilirubin: 0.2 mg/dL — ABNORMAL LOW (ref 0.3–1.2)
Total Protein: 7.4 g/dL (ref 6.5–8.1)

## 2019-02-26 LAB — SURGICAL PCR SCREEN
MRSA, PCR: NEGATIVE
Staphylococcus aureus: NEGATIVE

## 2019-02-26 LAB — APTT: aPTT: 49 s — ABNORMAL HIGH (ref 24–36)

## 2019-02-26 LAB — TYPE AND SCREEN
ABO/RH(D): A POS
Antibody Screen: NEGATIVE

## 2019-02-26 NOTE — Progress Notes (Signed)
PCP - [facility] Cardiologist - n/a  PPM/ICD - denies Device Orders -  Rep Notified -   Chest x-ray - 08/2018 EKG - 08/2018 Stress Test - denies ECHO - ddenies Cardiac Cath - denies  Sleep Study - n/a CPAP -   Fasting Blood Sugar - n/a Checks Blood Sugar _____ times a day  Blood Thinner Instructions:denies Aspirin Instructions:pt. Has been out of this asa, told to not take until after surgery  ERAS Protcol -given Ensure pre surgical drink with exact instruction  PRE-SURGERY Ensure or G2-   COVID TEST- to be done 02/28/2019   Anesthesia review: n/a  Patient denies shortness of breath, fever, cough and chest pain at PAT appointment   All instructions explained to the patient, with a verbal understanding of the material. Patient agrees to go over the instructions while at home for a better understanding. Patient also instructed to self quarantine after being tested for COVID-19. The opportunity to ask questions was provided.

## 2019-02-26 NOTE — Progress Notes (Signed)
[facility] [place], [place] [address] Phone: [phone] Fax: [place], [place] AT [place] [address] Phone: [phone] Fax: [phone]  [id number] [address] [place] [address] Phone: [phone] Fax: [place], [place] [address] [place] [address] Phone: [phone] Fax: [phone]      Your procedure is scheduled on Monday, November 16th, 2020.   Report to [facility] Main Entrance "A" at 10:40am , and check in at the Admitting office.   Call this number if you have problems the morning of surgery:  [phone]  Call [phone] if you have any questions prior to your surgery date Monday-Friday 8am-4pm    Remember:  Do not eat after midnight the night before your surgery  You may drink clear liquids until 10:00AM the morning of your surgery.    Clear liquids allowed are: Water, Non-Citrus Juices (without pulp), Carbonated Beverages, Clear Tea, Black Coffee Only, and Gatorade   Please complete your PRE-SURGERY ENSURE that was provided to you by 9:40a.m.  the morning of surgery.  Please, if able, drink it in one setting. DO NOT SIP.   Take these medicines the morning of surgery with A SIP OF WATER :  Buspirone (Buspar) Oxymetazoline (Afrin) nasal spray) - if needed Lamictal  7 days prior to surgery STOP taking any Aspirin (unless otherwise instructed by your surgeon), Aleve, Naproxen, Ibuprofen, Motrin, Advil, Goody's, BC's, all herbal medications, fish oil, and all vitamins.    The Morning of Surgery  Do not wear jewelry, make-up or nail polish.  Do not wear lotions, powders, or perfumes/colognes, or deodorant  Do not shave  48 hours prior to surgery.  Men may shave face and neck.  Do not bring valuables to the hospital.  [facility] is not responsible for any belongings or valuables.  If you are a smoker, DO NOT Smoke 24 hours prior to surgery IF you wear a CPAP at night please bring your mask, tubing, and machine the morning of surgery   Remember that you must have someone to transport you home after your surgery, and remain with you for 24 hours if you are discharged the same day.   Contacts, glasses, hearing aids, dentures or bridgework may not be worn into surgery.    Leave your suitcase in the car.  After surgery it may be brought to your room.  For patients admitted to the hospital, discharge time will be determined by your treatment team.  Patients discharged the day of surgery will not be allowed to drive home.    Special instructions:   [place]- Preparing For Surgery  Before surgery, you can play an important role. Because skin is not sterile, your skin needs to be as free of germs as possible. You can reduce the number of germs on your skin by washing with CHG (chlorahexidine gluconate) Soap before surgery.  CHG is an antiseptic cleaner which kills germs and bonds with the skin to continue killing germs even after washing.    Oral Hygiene is also important to reduce your risk of infection.  Remember - BRUSH YOUR TEETH THE MORNING OF SURGERY WITH YOUR REGULAR TOOTHPASTE  Please do not use if you  have an allergy to CHG or antibacterial soaps. If your skin becomes reddened/irritated stop using the CHG.  Do not shave (including legs and underarms) for at least 48 hours prior to first CHG shower. It is OK to shave your face.  Please follow these instructions carefully.   1. Shower the NIGHT BEFORE SURGERY and the MORNING OF SURGERY with CHG Soap.   2. If you chose to wash your hair, wash your hair first as usual with your normal shampoo.  3. After you shampoo, rinse your hair and body  thoroughly to remove the shampoo.  4. Use CHG as you would any other liquid soap. You can apply CHG directly to the skin and wash gently with a scrungie or a clean washcloth.   5. Apply the CHG Soap to your body ONLY FROM THE NECK DOWN.  Do not use on open wounds or open sores. Avoid contact with your eyes, ears, mouth and genitals (private parts). Wash Face and genitals (private parts)  with your normal soap.   6. Wash thoroughly, paying special attention to the area where your surgery will be performed.  7. Thoroughly rinse your body with warm water from the neck down.  8. DO NOT shower/wash with your normal soap after using and rinsing off the CHG Soap.  9. Pat yourself dry with a CLEAN TOWEL.  10. Wear CLEAN PAJAMAS to bed the night before surgery, wear comfortable clothes the morning of surgery  11. Place CLEAN SHEETS on your bed the night of your first shower and DO NOT SLEEP WITH PETS.    Day of Surgery:  Do not apply any deodorants/lotions. Please shower the morning of surgery with the CHG soap  Please wear clean clothes to the hospital/surgery center.   Remember to brush your teeth WITH YOUR REGULAR TOOTHPASTE.   Please read over the following fact sheets that you were given.

## 2019-02-27 ENCOUNTER — Ambulatory Visit: Payer: Medicare Other | Attending: Family Medicine | Admitting: Family Medicine

## 2019-02-27 ENCOUNTER — Encounter: Payer: Self-pay | Admitting: Family Medicine

## 2019-02-27 ENCOUNTER — Ambulatory Visit: Payer: Medicare Other

## 2019-02-27 VITALS — BP 122/79 | HR 86 | Temp 98.0°F

## 2019-02-27 DIAGNOSIS — G4700 Insomnia, unspecified: Secondary | ICD-10-CM | POA: Insufficient documentation

## 2019-02-27 DIAGNOSIS — G4709 Other insomnia: Secondary | ICD-10-CM

## 2019-02-27 DIAGNOSIS — R140 Abdominal distension (gaseous): Secondary | ICD-10-CM | POA: Diagnosis not present

## 2019-02-27 DIAGNOSIS — Z7982 Long term (current) use of aspirin: Secondary | ICD-10-CM | POA: Insufficient documentation

## 2019-02-27 DIAGNOSIS — R350 Frequency of micturition: Secondary | ICD-10-CM | POA: Insufficient documentation

## 2019-02-27 DIAGNOSIS — F331 Major depressive disorder, recurrent, moderate: Secondary | ICD-10-CM | POA: Insufficient documentation

## 2019-02-27 DIAGNOSIS — F17210 Nicotine dependence, cigarettes, uncomplicated: Secondary | ICD-10-CM | POA: Diagnosis not present

## 2019-02-27 DIAGNOSIS — E782 Mixed hyperlipidemia: Secondary | ICD-10-CM | POA: Insufficient documentation

## 2019-02-27 DIAGNOSIS — J449 Chronic obstructive pulmonary disease, unspecified: Secondary | ICD-10-CM | POA: Insufficient documentation

## 2019-02-27 DIAGNOSIS — R143 Flatulence: Secondary | ICD-10-CM | POA: Diagnosis not present

## 2019-02-27 DIAGNOSIS — N3281 Overactive bladder: Secondary | ICD-10-CM | POA: Insufficient documentation

## 2019-02-27 DIAGNOSIS — G2581 Restless legs syndrome: Secondary | ICD-10-CM | POA: Diagnosis not present

## 2019-02-27 DIAGNOSIS — Z79899 Other long term (current) drug therapy: Secondary | ICD-10-CM | POA: Diagnosis not present

## 2019-02-27 DIAGNOSIS — F419 Anxiety disorder, unspecified: Secondary | ICD-10-CM | POA: Insufficient documentation

## 2019-02-27 MED ORDER — TRAZODONE HCL 100 MG PO TABS: 200.0000 mg | ORAL_TABLET | Freq: Every evening | ORAL | 0 refills | Status: DC | PRN

## 2019-02-27 MED ORDER — SIMVASTATIN 40 MG PO TABS: 40.0000 mg | ORAL_TABLET | Freq: Every evening | ORAL | 6 refills | Status: DC

## 2019-02-27 MED ORDER — ROPINIROLE HCL 0.5 MG PO TABS: 0.5000 mg | ORAL_TABLET | Freq: Every day | ORAL | 2 refills | Status: DC

## 2019-02-27 MED ORDER — MIRABEGRON ER 25 MG PO TB24: 25.0000 mg | ORAL_TABLET | Freq: Every day | ORAL | 2 refills | Status: DC
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
## 2019-02-27 NOTE — Patient Instructions (Signed)

## 2019-02-27 NOTE — Progress Notes (Signed)
Patient is having frequent urination and requesting medication.  Trouble sleeping.  Patient is requesting Seroquel.

## 2019-02-28 ENCOUNTER — Other Ambulatory Visit (HOSPITAL_COMMUNITY)
Admission: RE | Admit: 2019-02-28 | Discharge: 2019-02-28 | Disposition: A | Discharge status: Home / Self Care | Payer: Medicare Other | Source: Ambulatory Visit | Attending: Orthopaedic Surgery | Admitting: Orthopaedic Surgery

## 2019-02-28 ENCOUNTER — Telehealth: Payer: Self-pay

## 2019-02-28 ENCOUNTER — Other Ambulatory Visit: Payer: Self-pay | Admitting: Family Medicine

## 2019-02-28 DIAGNOSIS — Z01812 Encounter for preprocedural laboratory examination: Secondary | ICD-10-CM | POA: Insufficient documentation

## 2019-02-28 DIAGNOSIS — J310 Chronic rhinitis: Secondary | ICD-10-CM | POA: Insufficient documentation

## 2019-02-28 DIAGNOSIS — E782 Mixed hyperlipidemia: Secondary | ICD-10-CM

## 2019-02-28 DIAGNOSIS — T485X5A Adverse effect of other anti-common-cold drugs, initial encounter: Secondary | ICD-10-CM | POA: Insufficient documentation

## 2019-02-28 DIAGNOSIS — Z20828 Contact with and (suspected) exposure to other viral communicable diseases: Secondary | ICD-10-CM | POA: Diagnosis not present

## 2019-02-28 DIAGNOSIS — R0981 Nasal congestion: Secondary | ICD-10-CM

## 2019-02-28 DIAGNOSIS — J3089 Other allergic rhinitis: Secondary | ICD-10-CM | POA: Insufficient documentation

## 2019-02-28 LAB — LIPID PANEL
Chol/HDL Ratio: 5 ratio — ABNORMAL HIGH (ref 0.0–4.4)
Cholesterol, Total: 264 mg/dL — ABNORMAL HIGH (ref 100–199)
HDL: 53 mg/dL
LDL Chol Calc (NIH): 154 mg/dL — ABNORMAL HIGH (ref 0–99)
Triglycerides: 310 mg/dL — ABNORMAL HIGH (ref 0–149)
VLDL Cholesterol Cal: 57 mg/dL — ABNORMAL HIGH (ref 5–40)

## 2019-02-28 LAB — H. PYLORI BREATH TEST: H pylori Breath Test: NEGATIVE

## 2019-02-28 NOTE — Telephone Encounter (Signed)
Patient was informed by ENT that she needs a referral to an allergy clinic.  She is requesting a allergy specialist in [place].

## 2019-03-01 ENCOUNTER — Telehealth: Payer: Self-pay | Admitting: *Deleted

## 2019-03-01 ENCOUNTER — Telehealth: Payer: Self-pay

## 2019-03-01 MED ORDER — CEFAZOLIN SODIUM-DEXTROSE 2-4 GM/100ML-% IV SOLN
2.0000 g | INTRAVENOUS | Status: AC
  Administered 2019-03-04: 2 g via INTRAVENOUS
  Filled 2019-03-01: qty 100

## 2019-03-01 MED ORDER — TRANEXAMIC ACID 1000 MG/10ML IV SOLN
2000.0000 mg | INTRAVENOUS | Status: AC
  Administered 2019-03-04: 2000 mg via TOPICAL
  Filled 2019-03-01: qty 20

## 2019-03-01 MED ORDER — TRANEXAMIC ACID-NACL 1000-0.7 MG/100ML-% IV SOLN
1000.0000 mg | INTRAVENOUS | Status: AC
  Administered 2019-03-04: 1000 mg via INTRAVENOUS
  Filled 2019-03-01: qty 100

## 2019-03-01 NOTE — Telephone Encounter (Signed)
-----   Message from Charlott Rakes, MD sent at 03/01/2019 11:09 AM EST ----- H.pylori test is negative

## 2019-03-01 NOTE — Telephone Encounter (Signed)
[name] can you please refer to an allergist with [facility] if we have one? If we don't please inform her accordingly.Thanks

## 2019-03-01 NOTE — Care Plan (Signed)
[name] call to patient to discuss upcoming Left THA with Dr. Erlinda Hong on Monday, 03/04/19. Patient is s/p Right THA on 08/27/18. She is an Ortho bundle patient and is aware of program as well as pre/post- surgery instructions. Has all DME needed for after surgery. Her brother will be coming and staying with her while she recovers for the next several weeks. F/U appointment with Dr. Erlinda Hong already scheduled for 03/19/19. Anticipate HHPT will be needed after short hospital stay. Referral made to [facility] as they provided HHPT to patient for her previous hip arthroplasty. Will continue to follow for CM needs.

## 2019-03-01 NOTE — Telephone Encounter (Signed)
Patient name and DOB has been verified Patient was informed of lab results. Patient had no questions.  

## 2019-03-01 NOTE — Telephone Encounter (Signed)
Pre-op Ortho bundle call completed. 

## 2019-03-03 LAB — NOVEL CORONAVIRUS, NAA (HOSP ORDER, SEND-OUT TO REF LAB; TAT 18-24 HRS): SARS-CoV-2, NAA: NOT DETECTED

## 2019-03-04 ENCOUNTER — Observation Stay (HOSPITAL_COMMUNITY)
Admission: RE | Admit: 2019-03-04 | Discharge: 2019-03-05 | Disposition: A | Discharge status: Home / Self Care | Payer: Medicare Other | Attending: Orthopaedic Surgery | Admitting: Orthopaedic Surgery

## 2019-03-04 ENCOUNTER — Ambulatory Visit (HOSPITAL_COMMUNITY): Payer: Medicare Other | Admitting: Anesthesiology

## 2019-03-04 ENCOUNTER — Other Ambulatory Visit: Payer: Self-pay

## 2019-03-04 ENCOUNTER — Encounter (HOSPITAL_COMMUNITY)
Admission: RE | Disposition: A | Discharge status: Home / Self Care | Payer: Self-pay | Source: Home / Self Care | Attending: Orthopaedic Surgery

## 2019-03-04 ENCOUNTER — Ambulatory Visit (HOSPITAL_COMMUNITY): Payer: Medicare Other

## 2019-03-04 ENCOUNTER — Encounter (HOSPITAL_COMMUNITY): Payer: Self-pay | Admitting: *Deleted

## 2019-03-04 ENCOUNTER — Observation Stay (HOSPITAL_COMMUNITY): Payer: Medicare Other

## 2019-03-04 DIAGNOSIS — F419 Anxiety disorder, unspecified: Secondary | ICD-10-CM | POA: Diagnosis not present

## 2019-03-04 DIAGNOSIS — Z79899 Other long term (current) drug therapy: Secondary | ICD-10-CM | POA: Diagnosis not present

## 2019-03-04 DIAGNOSIS — Z90710 Acquired absence of both cervix and uterus: Secondary | ICD-10-CM | POA: Diagnosis not present

## 2019-03-04 DIAGNOSIS — F17210 Nicotine dependence, cigarettes, uncomplicated: Secondary | ICD-10-CM | POA: Diagnosis not present

## 2019-03-04 DIAGNOSIS — K219 Gastro-esophageal reflux disease without esophagitis: Secondary | ICD-10-CM | POA: Insufficient documentation

## 2019-03-04 DIAGNOSIS — J449 Chronic obstructive pulmonary disease, unspecified: Secondary | ICD-10-CM | POA: Diagnosis not present

## 2019-03-04 DIAGNOSIS — E782 Mixed hyperlipidemia: Secondary | ICD-10-CM | POA: Diagnosis not present

## 2019-03-04 DIAGNOSIS — M1612 Unilateral primary osteoarthritis, left hip: Principal | ICD-10-CM | POA: Diagnosis present

## 2019-03-04 DIAGNOSIS — Z9842 Cataract extraction status, left eye: Secondary | ICD-10-CM | POA: Insufficient documentation

## 2019-03-04 DIAGNOSIS — G40909 Epilepsy, unspecified, not intractable, without status epilepticus: Secondary | ICD-10-CM | POA: Insufficient documentation

## 2019-03-04 DIAGNOSIS — F418 Other specified anxiety disorders: Secondary | ICD-10-CM | POA: Diagnosis not present

## 2019-03-04 DIAGNOSIS — Z96649 Presence of unspecified artificial hip joint: Secondary | ICD-10-CM

## 2019-03-04 DIAGNOSIS — Z8719 Personal history of other diseases of the digestive system: Secondary | ICD-10-CM | POA: Insufficient documentation

## 2019-03-04 DIAGNOSIS — Z8614 Personal history of Methicillin resistant Staphylococcus aureus infection: Secondary | ICD-10-CM | POA: Diagnosis not present

## 2019-03-04 DIAGNOSIS — Z90721 Acquired absence of ovaries, unilateral: Secondary | ICD-10-CM | POA: Insufficient documentation

## 2019-03-04 DIAGNOSIS — F1410 Cocaine abuse, uncomplicated: Secondary | ICD-10-CM | POA: Insufficient documentation

## 2019-03-04 DIAGNOSIS — Z961 Presence of intraocular lens: Secondary | ICD-10-CM | POA: Diagnosis not present

## 2019-03-04 DIAGNOSIS — E785 Hyperlipidemia, unspecified: Secondary | ICD-10-CM | POA: Insufficient documentation

## 2019-03-04 DIAGNOSIS — Z87820 Personal history of traumatic brain injury: Secondary | ICD-10-CM | POA: Insufficient documentation

## 2019-03-04 DIAGNOSIS — Z7982 Long term (current) use of aspirin: Secondary | ICD-10-CM | POA: Insufficient documentation

## 2019-03-04 DIAGNOSIS — N3010 Interstitial cystitis (chronic) without hematuria: Secondary | ICD-10-CM | POA: Diagnosis not present

## 2019-03-04 DIAGNOSIS — F329 Major depressive disorder, single episode, unspecified: Secondary | ICD-10-CM | POA: Insufficient documentation

## 2019-03-04 DIAGNOSIS — Z888 Allergy status to other drugs, medicaments and biological substances status: Secondary | ICD-10-CM | POA: Insufficient documentation

## 2019-03-04 DIAGNOSIS — Z8711 Personal history of peptic ulcer disease: Secondary | ICD-10-CM | POA: Insufficient documentation

## 2019-03-04 DIAGNOSIS — Z419 Encounter for procedure for purposes other than remedying health state, unspecified: Secondary | ICD-10-CM

## 2019-03-04 DIAGNOSIS — Z96641 Presence of right artificial hip joint: Secondary | ICD-10-CM | POA: Diagnosis not present

## 2019-03-04 DIAGNOSIS — Z9841 Cataract extraction status, right eye: Secondary | ICD-10-CM | POA: Insufficient documentation

## 2019-03-04 DIAGNOSIS — Z791 Long term (current) use of non-steroidal anti-inflammatories (NSAID): Secondary | ICD-10-CM | POA: Diagnosis not present

## 2019-03-04 DIAGNOSIS — F1011 Alcohol abuse, in remission: Secondary | ICD-10-CM | POA: Diagnosis not present

## 2019-03-04 DIAGNOSIS — G4700 Insomnia, unspecified: Secondary | ICD-10-CM | POA: Diagnosis not present

## 2019-03-04 DIAGNOSIS — Z96642 Presence of left artificial hip joint: Secondary | ICD-10-CM

## 2019-03-04 HISTORY — PX: TOTAL HIP ARTHROPLASTY: SHX124

## 2019-03-04 SURGERY — TOTAL HIP ARTHROPLASTY ANTERIOR APPROACH
Anesthesia: Spinal | Site: Hip | Laterality: Left

## 2019-03-04 MED ORDER — PHENYLEPHRINE HCL-NACL 10-0.9 MG/250ML-% IV SOLN
INTRAVENOUS | Status: DC | PRN
  Administered 2019-03-04: 25 ug/min via INTRAVENOUS

## 2019-03-04 MED ORDER — OXYCODONE HCL 5 MG PO TABS
10.0000 mg | ORAL_TABLET | ORAL | Status: DC | PRN
  Administered 2019-03-04 (×2): 10 mg via ORAL
  Filled 2019-03-04 (×2): qty 2

## 2019-03-04 MED ORDER — LACTATED RINGERS IV SOLN
INTRAVENOUS | Status: DC
  Administered 2019-03-04: 12:00:00 via INTRAVENOUS

## 2019-03-04 MED ORDER — PHENOL 1.4 % MT LIQD: 1.0000 | OROMUCOSAL | Status: DC | PRN

## 2019-03-04 MED ORDER — SODIUM CHLORIDE 0.9 % IR SOLN
Status: DC | PRN
  Administered 2019-03-04: 3000 mL

## 2019-03-04 MED ORDER — PROMETHAZINE HCL 25 MG/ML IJ SOLN: 6.2500 mg | INTRAMUSCULAR | Status: DC | PRN

## 2019-03-04 MED ORDER — FENTANYL CITRATE (PF) 250 MCG/5ML IJ SOLN
INTRAMUSCULAR | Status: AC
  Filled 2019-03-04: qty 5

## 2019-03-04 MED ORDER — LAMOTRIGINE 100 MG PO TABS
100.0000 mg | ORAL_TABLET | Freq: Every day | ORAL | Status: DC
  Administered 2019-03-05: 100 mg via ORAL
  Filled 2019-03-04: qty 1

## 2019-03-04 MED ORDER — PHENYLEPHRINE 40 MCG/ML (10ML) SYRINGE FOR IV PUSH (FOR BLOOD PRESSURE SUPPORT)
PREFILLED_SYRINGE | INTRAVENOUS | Status: AC
  Filled 2019-03-04: qty 10

## 2019-03-04 MED ORDER — MAGNESIUM CITRATE PO SOLN: 1.0000 | Freq: Once | ORAL | Status: DC | PRN

## 2019-03-04 MED ORDER — ASPIRIN 81 MG PO CHEW
81.0000 mg | CHEWABLE_TABLET | Freq: Two times a day (BID) | ORAL | Status: DC
  Administered 2019-03-04 – 2019-03-05 (×2): 81 mg via ORAL
  Filled 2019-03-04 (×2): qty 1

## 2019-03-04 MED ORDER — VANCOMYCIN HCL 1000 MG IV SOLR
INTRAVENOUS | Status: AC
  Filled 2019-03-04: qty 1000

## 2019-03-04 MED ORDER — ONDANSETRON HCL 4 MG PO TABS: 4.0000 mg | ORAL_TABLET | Freq: Four times a day (QID) | ORAL | Status: DC | PRN

## 2019-03-04 MED ORDER — METHOCARBAMOL 750 MG PO TABS: 750.0000 mg | ORAL_TABLET | Freq: Two times a day (BID) | ORAL | 0 refills | Status: DC | PRN

## 2019-03-04 MED ORDER — CHLORHEXIDINE GLUCONATE 4 % EX LIQD: 60.0000 mL | Freq: Once | CUTANEOUS | Status: DC

## 2019-03-04 MED ORDER — KETOROLAC TROMETHAMINE 10 MG PO TABS: 10.0000 mg | ORAL_TABLET | Freq: Two times a day (BID) | ORAL | 0 refills | Status: DC | PRN

## 2019-03-04 MED ORDER — MENTHOL 3 MG MT LOZG: 1.0000 | LOZENGE | OROMUCOSAL | Status: DC | PRN

## 2019-03-04 MED ORDER — DIPHENHYDRAMINE HCL 12.5 MG/5ML PO ELIX
25.0000 mg | ORAL_SOLUTION | ORAL | Status: DC | PRN
  Filled 2019-03-04: qty 10

## 2019-03-04 MED ORDER — SIMVASTATIN 20 MG PO TABS
40.0000 mg | ORAL_TABLET | Freq: Every evening | ORAL | Status: DC
  Administered 2019-03-04: 40 mg via ORAL
  Filled 2019-03-04: qty 2

## 2019-03-04 MED ORDER — SENNOSIDES-DOCUSATE SODIUM 8.6-50 MG PO TABS: 1.0000 | ORAL_TABLET | Freq: Every evening | ORAL | 1 refills | Status: DC | PRN

## 2019-03-04 MED ORDER — MIRABEGRON ER 25 MG PO TB24
25.0000 mg | ORAL_TABLET | Freq: Every day | ORAL | Status: DC
  Administered 2019-03-05: 25 mg via ORAL
  Filled 2019-03-04: qty 1

## 2019-03-04 MED ORDER — KETOROLAC TROMETHAMINE 15 MG/ML IJ SOLN
15.0000 mg | Freq: Four times a day (QID) | INTRAMUSCULAR | Status: DC
  Administered 2019-03-04 – 2019-03-05 (×3): 15 mg via INTRAVENOUS
  Filled 2019-03-04 (×3): qty 1

## 2019-03-04 MED ORDER — OXYMETAZOLINE HCL 0.05 % NA SOLN
1.0000 | Freq: Every evening | NASAL | Status: DC | PRN
  Filled 2019-03-04: qty 30

## 2019-03-04 MED ORDER — DEXAMETHASONE SODIUM PHOSPHATE 10 MG/ML IJ SOLN
10.0000 mg | Freq: Once | INTRAMUSCULAR | Status: AC
  Administered 2019-03-05: 10 mg via INTRAVENOUS
  Filled 2019-03-04: qty 1

## 2019-03-04 MED ORDER — OXYCODONE HCL 5 MG PO TABS
5.0000 mg | ORAL_TABLET | Freq: Once | ORAL | Status: AC | PRN
  Administered 2019-03-04: 5 mg via ORAL

## 2019-03-04 MED ORDER — MIDAZOLAM HCL 2 MG/2ML IJ SOLN
INTRAMUSCULAR | Status: AC
  Filled 2019-03-04: qty 2

## 2019-03-04 MED ORDER — EPHEDRINE 5 MG/ML INJ
INTRAVENOUS | Status: AC
  Filled 2019-03-04: qty 20

## 2019-03-04 MED ORDER — BUPIVACAINE IN DEXTROSE 0.75-8.25 % IT SOLN
INTRATHECAL | Status: DC | PRN
  Administered 2019-03-04: 1.6 mL via INTRATHECAL

## 2019-03-04 MED ORDER — ASPIRIN EC 81 MG PO TBEC: 81.0000 mg | DELAYED_RELEASE_TABLET | Freq: Two times a day (BID) | ORAL | 0 refills | Status: DC

## 2019-03-04 MED ORDER — ONDANSETRON HCL 4 MG/2ML IJ SOLN
INTRAMUSCULAR | Status: AC
  Filled 2019-03-04: qty 2

## 2019-03-04 MED ORDER — ONDANSETRON HCL 4 MG PO TABS: 4.0000 mg | ORAL_TABLET | Freq: Three times a day (TID) | ORAL | 0 refills | Status: DC | PRN

## 2019-03-04 MED ORDER — LAMOTRIGINE 100 MG PO TABS: 100.0000 mg | ORAL_TABLET | ORAL | Status: DC

## 2019-03-04 MED ORDER — FENTANYL CITRATE (PF) 100 MCG/2ML IJ SOLN
INTRAMUSCULAR | Status: DC | PRN
  Administered 2019-03-04 (×9): 50 ug via INTRAVENOUS

## 2019-03-04 MED ORDER — MIDAZOLAM HCL 5 MG/5ML IJ SOLN
INTRAMUSCULAR | Status: DC | PRN
  Administered 2019-03-04: 2 mg via INTRAVENOUS

## 2019-03-04 MED ORDER — NICOTINE 7 MG/24HR TD PT24
7.0000 mg | MEDICATED_PATCH | Freq: Every day | TRANSDERMAL | Status: DC
  Administered 2019-03-04: 7 mg via TRANSDERMAL
  Filled 2019-03-04 (×2): qty 1

## 2019-03-04 MED ORDER — SUCCINYLCHOLINE CHLORIDE 20 MG/ML IJ SOLN
INTRAMUSCULAR | Status: DC | PRN
  Administered 2019-03-04: 80 mg via INTRAVENOUS

## 2019-03-04 MED ORDER — ACETAMINOPHEN 325 MG PO TABS: 325.0000 mg | ORAL_TABLET | Freq: Four times a day (QID) | ORAL | Status: DC | PRN

## 2019-03-04 MED ORDER — BUSPIRONE HCL 15 MG PO TABS
7.5000 mg | ORAL_TABLET | Freq: Two times a day (BID) | ORAL | Status: DC | PRN
  Filled 2019-03-04: qty 1

## 2019-03-04 MED ORDER — HYDROXYZINE HCL 25 MG PO TABS
25.0000 mg | ORAL_TABLET | Freq: Three times a day (TID) | ORAL | Status: DC | PRN
  Administered 2019-03-05: 25 mg via ORAL
  Filled 2019-03-04: qty 1

## 2019-03-04 MED ORDER — DIPHENHYDRAMINE HCL 12.5 MG/5ML PO ELIX
ORAL_SOLUTION | ORAL | Status: AC
  Filled 2019-03-04: qty 10

## 2019-03-04 MED ORDER — SORBITOL 70 % SOLN
30.0000 mL | Freq: Every day | Status: DC | PRN
  Filled 2019-03-04: qty 30

## 2019-03-04 MED ORDER — METOCLOPRAMIDE HCL 5 MG/ML IJ SOLN: 5.0000 mg | Freq: Three times a day (TID) | INTRAMUSCULAR | Status: DC | PRN

## 2019-03-04 MED ORDER — LABETALOL HCL 5 MG/ML IV SOLN: INTRAVENOUS | Status: DC | PRN

## 2019-03-04 MED ORDER — HYDROMORPHONE HCL 1 MG/ML IJ SOLN
0.5000 mg | INTRAMUSCULAR | Status: DC | PRN
  Administered 2019-03-04: 1 mg via INTRAVENOUS
  Filled 2019-03-04: qty 1

## 2019-03-04 MED ORDER — DIPHENHYDRAMINE HCL 25 MG PO CAPS
25.0000 mg | ORAL_CAPSULE | Freq: Once | ORAL | Status: AC
  Administered 2019-03-04: 25 mg via ORAL
  Filled 2019-03-04: qty 1

## 2019-03-04 MED ORDER — DOCUSATE SODIUM 100 MG PO CAPS
100.0000 mg | ORAL_CAPSULE | Freq: Two times a day (BID) | ORAL | Status: DC
  Administered 2019-03-04 – 2019-03-05 (×2): 100 mg via ORAL
  Filled 2019-03-04 (×2): qty 1

## 2019-03-04 MED ORDER — PROPOFOL 500 MG/50ML IV EMUL
INTRAVENOUS | Status: DC | PRN
  Administered 2019-03-04: 100 ug/kg/min via INTRAVENOUS

## 2019-03-04 MED ORDER — ROCURONIUM BROMIDE 10 MG/ML (PF) SYRINGE
PREFILLED_SYRINGE | INTRAVENOUS | Status: AC
  Filled 2019-03-04: qty 10

## 2019-03-04 MED ORDER — ONDANSETRON HCL 4 MG/2ML IJ SOLN: 4.0000 mg | Freq: Four times a day (QID) | INTRAMUSCULAR | Status: DC | PRN

## 2019-03-04 MED ORDER — OXYCODONE HCL 5 MG PO TABS
5.0000 mg | ORAL_TABLET | ORAL | Status: DC | PRN
  Administered 2019-03-05 (×2): 10 mg via ORAL
  Filled 2019-03-04 (×2): qty 2

## 2019-03-04 MED ORDER — ALUM & MAG HYDROXIDE-SIMETH 200-200-20 MG/5ML PO SUSP: 30.0000 mL | ORAL | Status: DC | PRN

## 2019-03-04 MED ORDER — DOXEPIN HCL 10 MG PO CAPS
20.0000 mg | ORAL_CAPSULE | Freq: Every day | ORAL | Status: DC
  Administered 2019-03-04: 20 mg via ORAL
  Filled 2019-03-04 (×2): qty 2

## 2019-03-04 MED ORDER — TRAZODONE HCL 100 MG PO TABS
200.0000 mg | ORAL_TABLET | Freq: Every evening | ORAL | Status: DC | PRN
  Filled 2019-03-04: qty 2

## 2019-03-04 MED ORDER — POLYETHYLENE GLYCOL 3350 17 G PO PACK: 17.0000 g | PACK | Freq: Every day | ORAL | Status: DC | PRN

## 2019-03-04 MED ORDER — SODIUM CHLORIDE 0.9 % IV SOLN
INTRAVENOUS | Status: DC
  Administered 2019-03-04: 17:00:00 via INTRAVENOUS

## 2019-03-04 MED ORDER — METHOCARBAMOL 750 MG PO TABS
750.0000 mg | ORAL_TABLET | Freq: Four times a day (QID) | ORAL | Status: DC | PRN
  Administered 2019-03-04 – 2019-03-05 (×3): 750 mg via ORAL
  Filled 2019-03-04 (×3): qty 1

## 2019-03-04 MED ORDER — GABAPENTIN 300 MG PO CAPS
300.0000 mg | ORAL_CAPSULE | Freq: Three times a day (TID) | ORAL | Status: DC
  Administered 2019-03-04 – 2019-03-05 (×3): 300 mg via ORAL
  Filled 2019-03-04 (×3): qty 1

## 2019-03-04 MED ORDER — DEXAMETHASONE SODIUM PHOSPHATE 10 MG/ML IJ SOLN
INTRAMUSCULAR | Status: DC | PRN
  Administered 2019-03-04: 5 mg via INTRAVENOUS

## 2019-03-04 MED ORDER — OXYCODONE HCL 5 MG/5ML PO SOLN: 5.0000 mg | Freq: Once | ORAL | Status: AC | PRN

## 2019-03-04 MED ORDER — OXYCODONE HCL ER 10 MG PO T12A
10.0000 mg | EXTENDED_RELEASE_TABLET | Freq: Two times a day (BID) | ORAL | Status: DC
  Administered 2019-03-04 – 2019-03-05 (×2): 10 mg via ORAL
  Filled 2019-03-04 (×2): qty 1

## 2019-03-04 MED ORDER — FENTANYL CITRATE (PF) 100 MCG/2ML IJ SOLN
25.0000 ug | INTRAMUSCULAR | Status: DC | PRN
  Administered 2019-03-04 (×2): 50 ug via INTRAVENOUS

## 2019-03-04 MED ORDER — SUGAMMADEX SODIUM 200 MG/2ML IV SOLN
INTRAVENOUS | Status: DC | PRN
  Administered 2019-03-04: 200 mg via INTRAVENOUS

## 2019-03-04 MED ORDER — POVIDONE-IODINE 10 % EX SWAB: 2.0000 "application " | Freq: Once | CUTANEOUS | Status: DC

## 2019-03-04 MED ORDER — DEXAMETHASONE SODIUM PHOSPHATE 10 MG/ML IJ SOLN
INTRAMUSCULAR | Status: AC
  Filled 2019-03-04: qty 3

## 2019-03-04 MED ORDER — ONDANSETRON HCL 4 MG/2ML IJ SOLN
INTRAMUSCULAR | Status: DC | PRN
  Administered 2019-03-04: 4 mg via INTRAVENOUS

## 2019-03-04 MED ORDER — ACETAMINOPHEN 500 MG PO TABS
ORAL_TABLET | ORAL | Status: AC
  Administered 2019-03-04: 1000 mg via ORAL
  Filled 2019-03-04: qty 2

## 2019-03-04 MED ORDER — CEFAZOLIN SODIUM-DEXTROSE 2-4 GM/100ML-% IV SOLN
2.0000 g | Freq: Four times a day (QID) | INTRAVENOUS | Status: AC
  Administered 2019-03-04 – 2019-03-05 (×3): 2 g via INTRAVENOUS
  Filled 2019-03-04 (×3): qty 100

## 2019-03-04 MED ORDER — METOCLOPRAMIDE HCL 5 MG PO TABS: 5.0000 mg | ORAL_TABLET | Freq: Three times a day (TID) | ORAL | Status: DC | PRN

## 2019-03-04 MED ORDER — ADULT MULTIVITAMIN W/MINERALS CH: 1.0000 | ORAL_TABLET | Freq: Every day | ORAL | Status: DC

## 2019-03-04 MED ORDER — 0.9 % SODIUM CHLORIDE (POUR BTL) OPTIME
TOPICAL | Status: DC | PRN
  Administered 2019-03-04: 1000 mL

## 2019-03-04 MED ORDER — OXYCODONE HCL 5 MG PO TABS: 5.0000 mg | ORAL_TABLET | Freq: Three times a day (TID) | ORAL | 0 refills | Status: DC | PRN

## 2019-03-04 MED ORDER — VANCOMYCIN HCL 1 G IV SOLR
INTRAVENOUS | Status: DC | PRN
  Administered 2019-03-04: 1000 mg via TOPICAL

## 2019-03-04 MED ORDER — METHOCARBAMOL 1000 MG/10ML IJ SOLN
500.0000 mg | Freq: Four times a day (QID) | INTRAVENOUS | Status: DC | PRN
  Filled 2019-03-04: qty 5

## 2019-03-04 MED ORDER — OXYCODONE HCL 5 MG PO TABS
ORAL_TABLET | ORAL | Status: AC
  Filled 2019-03-04: qty 1

## 2019-03-04 MED ORDER — LAMOTRIGINE 100 MG PO TABS
200.0000 mg | ORAL_TABLET | Freq: Every day | ORAL | Status: DC
  Administered 2019-03-04: 200 mg via ORAL
  Filled 2019-03-04 (×2): qty 2

## 2019-03-04 MED ORDER — ROCURONIUM BROMIDE 10 MG/ML (PF) SYRINGE
PREFILLED_SYRINGE | INTRAVENOUS | Status: DC | PRN
  Administered 2019-03-04: 60 mg via INTRAVENOUS

## 2019-03-04 MED ORDER — ROPINIROLE HCL 1 MG PO TABS
0.5000 mg | ORAL_TABLET | Freq: Every day | ORAL | Status: DC
  Administered 2019-03-04: 0.5 mg via ORAL
  Filled 2019-03-04: qty 1

## 2019-03-04 MED ORDER — ACETAMINOPHEN 500 MG PO TABS
1000.0000 mg | ORAL_TABLET | Freq: Four times a day (QID) | ORAL | Status: DC
  Administered 2019-03-04 – 2019-03-05 (×3): 1000 mg via ORAL
  Filled 2019-03-04 (×3): qty 2

## 2019-03-04 MED ORDER — PROPOFOL 10 MG/ML IV BOLUS
INTRAVENOUS | Status: DC | PRN
  Administered 2019-03-04: 25 mg via INTRAVENOUS
  Administered 2019-03-04: 10 mg via INTRAVENOUS
  Administered 2019-03-04: 20 mg via INTRAVENOUS
  Administered 2019-03-04: 120 mg via INTRAVENOUS
  Administered 2019-03-04: 20 mg via INTRAVENOUS

## 2019-03-04 MED ORDER — FENTANYL CITRATE (PF) 100 MCG/2ML IJ SOLN
INTRAMUSCULAR | Status: AC
  Filled 2019-03-04: qty 2

## 2019-03-04 MED ORDER — SUCCINYLCHOLINE CHLORIDE 200 MG/10ML IV SOSY
PREFILLED_SYRINGE | INTRAVENOUS | Status: AC
  Filled 2019-03-04: qty 10

## 2019-03-04 MED ORDER — ACETAMINOPHEN 500 MG PO TABS
1000.0000 mg | ORAL_TABLET | Freq: Once | ORAL | Status: AC
  Administered 2019-03-04: 12:00:00 1000 mg via ORAL

## 2019-03-04 MED ORDER — GLYCOPYRROLATE PF 0.2 MG/ML IJ SOSY
PREFILLED_SYRINGE | INTRAMUSCULAR | Status: AC
  Filled 2019-03-04: qty 2

## 2019-03-04 SURGICAL SUPPLY — 54 items
ACETAB CUP W/GRIPTION 54 (Plate) ×2 IMPLANT
BAG DECANTER FOR FLEXI CONT (MISCELLANEOUS) ×2
CELLS DAT CNTRL 66122 CELL SVR (MISCELLANEOUS) IMPLANT
COVER PERINEAL POST (MISCELLANEOUS) ×2
COVER SURGICAL LIGHT HANDLE (MISCELLANEOUS) ×2
COVER WAND RF STERILE (DRAPES) ×2
DRAPE C-ARM 42X72 X-RAY (DRAPES) ×2
DRAPE POUCH INSTRU U-SHP 10X18 (DRAPES) ×2
DRAPE STERI IOBAN 125X83 (DRAPES) ×2
DRAPE U-SHAPE 47X51 STRL (DRAPES) ×4
DRSG AQUACEL AG ADV 3.5X10 (GAUZE/BANDAGES/DRESSINGS) ×2
DURAPREP 26ML APPLICATOR (WOUND CARE) ×4
ELECT BLADE 4.0 EZ CLEAN MEGAD (MISCELLANEOUS) ×2
ELECT REM PT RETURN 9FT ADLT (ELECTROSURGICAL) ×2
GLOVE BIOGEL PI IND STRL 7.0 (GLOVE) ×1
GLOVE BIOGEL PI INDICATOR 7.0 (GLOVE) ×1
GLOVE ECLIPSE 7.0 STRL STRAW (GLOVE) ×4
GLOVE SKINSENSE NS SZ7.5 (GLOVE) ×1
GLOVE SKINSENSE STRL SZ7.5 (GLOVE) ×1
GLOVE SURG SYN 7.5  E (GLOVE) ×4
GLOVE SURG SYN 7.5 E (GLOVE) ×4 IMPLANT
GOWN STRL REIN XL XLG (GOWN DISPOSABLE) ×2
GOWN STRL REUS W/ TWL LRG LVL3 (GOWN DISPOSABLE)
GOWN STRL REUS W/ TWL XL LVL3 (GOWN DISPOSABLE) ×1
GOWN STRL REUS W/TWL LRG LVL3 (GOWN DISPOSABLE)
GOWN STRL REUS W/TWL XL LVL3 (GOWN DISPOSABLE) ×2
HANDPIECE INTERPULSE COAX TIP (DISPOSABLE) ×2
HEAD CERAMIC 36 PLUS 8.5 12 14 (Hips) ×2 IMPLANT
HOOD PEEL AWAY FLYTE STAYCOOL (MISCELLANEOUS) ×4
IV NS IRRIG 3000ML ARTHROMATIC (IV SOLUTION) ×2
KIT BASIN OR (CUSTOM PROCEDURE TRAY) ×2
LINER NEUTRAL 54X36MM PLUS 4 (Hips) ×2 IMPLANT
MARKER SKIN DUAL TIP RULER LAB (MISCELLANEOUS) ×2
NDL SPNL 18GX3.5 QUINCKE PK (NEEDLE) ×1 IMPLANT
NEEDLE SPNL 18GX3.5 QUINCKE PK (NEEDLE) ×2
PACK TOTAL JOINT (CUSTOM PROCEDURE TRAY) ×2
PACK UNIVERSAL I (CUSTOM PROCEDURE TRAY) ×2
RTRCTR WOUND ALEXIS 18CM MED (MISCELLANEOUS)
SAW OSC TIP CART 19.5X105X1.3 (SAW) ×2
SCREW 6.5MMX25MM (Screw) ×2 IMPLANT
SET HNDPC FAN SPRY TIP SCT (DISPOSABLE) ×1
STAPLER VISISTAT 35W (STAPLE)
STEM FEMORAL SZ 6MM STD ACTIS (Stem) ×2 IMPLANT
SUT ETHIBOND 2 V 37 (SUTURE) ×2
SUT VIC AB 0 CT1 27 (SUTURE) ×2
SUT VIC AB 0 CT1 27XBRD ANBCTR (SUTURE) ×1
SUT VIC AB 1 CTX 36 (SUTURE) ×2
SUT VIC AB 1 CTX36XBRD ANBCTR (SUTURE) ×1
SUT VIC AB 2-0 CT1 27 (SUTURE) ×4
SUT VIC AB 2-0 CT1 TAPERPNT 27 (SUTURE) ×2
SYR 50ML LL SCALE MARK (SYRINGE) ×2
TOWEL GREEN STERILE (TOWEL DISPOSABLE) ×2
TRAY CATH 16FR W/PLASTIC CATH (SET/KITS/TRAYS/PACK)
YANKAUER SUCT BULB TIP NO VENT (SUCTIONS) ×2

## 2019-03-04 NOTE — Discharge Instructions (Signed)

## 2019-03-04 NOTE — Anesthesia Procedure Notes (Signed)
Procedure Name: MAC Date/Time: 03/04/2019 1:00 PM Performed by: Imagene Riches, CRNA Pre-anesthesia Checklist: Patient identified, Emergency Drugs available, Suction available, Patient being monitored and Timeout performed Patient Re-evaluated:Patient Re-evaluated prior to induction Oxygen Delivery Method: Simple face mask Ventilation: Oral airway inserted - appropriate to patient size

## 2019-03-04 NOTE — Op Note (Signed)
LEFT TOTAL HIP ARTHROPLASTY ANTERIOR APPROACH  Procedure Note [name]   [id number]  Pre-op Diagnosis: left hip degenerative joint disease     Post-op Diagnosis: same   Operative Procedures  1. Total hip replacement; Left hip; uncemented [id number]   Personnel  Surgeon(s): Leandrew Koyanagi, MD  Assist: Madalyn Rob, PA-C; necessary for the timely completion of procedure and due to complexity of procedure.   Anesthesia: spinal, LMA  Prosthesis: Depuy Acetabulum: Pinnacle 54 mm Femur: Actis 6 STD Head: 36 mm size: +8.5 Liner: +4 neutral Bearing Type: ceramic on poly  Total Hip Arthroplasty (Anterior Approach) Op Note:  After informed consent was obtained and the operative extremity marked in the holding area, the patient was brought back to the operating room and placed supine on the HANA table. Next, the operative extremity was prepped and draped in normal sterile fashion. Surgical timeout occurred verifying patient identification, surgical site, surgical procedure and administration of antibiotics.  A modified anterior Smith-Peterson approach to the hip was performed, using the interval between tensor fascia lata and sartorius.  Dissection was carried bluntly down onto the anterior hip capsule. The lateral femoral circumflex vessels were identified and coagulated. A capsulotomy was performed and the capsular flaps tagged for later repair.  Fluoroscopy was utilized to prepare for the femoral neck cut. The neck osteotomy was performed. The femoral head was removed, the acetabular rim was cleared of soft tissue and calcified labrum and attention was turned to reaming the acetabulum.  Sequential reaming was performed under fluoroscopic guidance. We reamed to a size 53 mm, and then impacted the acetabular shell. The liner was then placed after irrigation and attention turned to the femur.  After placing the femoral hook, the leg was taken to externally rotated, extended and  adducted position taking care to perform soft tissue releases to allow for adequate mobilization of the femur. Soft tissue was cleared from the shoulder of the greater trochanter and the hook elevator used to improve exposure of the proximal femur. Sequential broaching performed up to a size 6. Trial neck and head were placed. The leg was brought back up to neutral and the construct reduced. The position and sizing of components, offset and leg lengths were checked using fluoroscopy. Stability of the construct was checked in extension and external rotation without any subluxation or impingement of prosthesis. We dislocated the prosthesis, dropped the leg back into position, removed trial components, and irrigated copiously. The final stem and head was then placed, the leg brought back up, the system reduced and fluoroscopy used to verify positioning.  We irrigated, obtained hemostasis and closed the capsule using #2 ethibond suture.  One gram of vancomycin powder was placed in the surgical bed. The fascia was closed with #1 vicryl plus, the deep fat layer was closed with 0 vicryl, the subcutaneous layers closed with 2.0 Vicryl Plus and the skin closed with 2.0 nylon and dermabond. A sterile dressing was applied. The patient was awakened in the operating room and taken to recovery in stable condition.  All sponge, needle, and instrument counts were correct at the end of the case.   Position: supine  Complications: see description of procedure.  Time Out: performed   Drains/Packing: none  Estimated blood loss: see anesthesia record  Returned to Recovery Room: in good condition.   Antibiotics: yes   Mechanical VTE (DVT) Prophylaxis: sequential compression devices, TED thigh-high  Chemical VTE (DVT) Prophylaxis: aspirin   Fluid Replacement: see anesthesia record  Specimens Removed: 1 to pathology   Sponge and Instrument Count Correct? yes   PACU: portable radiograph - low AP   Plan/RTC: Return  in 2 weeks for staple removal. Weight Bearing/Load Lower Extremity: full  Hip precautions: none Suture Removal: 2 weeks   N. Eduard Roux, MD Marga Hoots 2:06 PM   Implant Name Type Inv. Item Serial No. Manufacturer Lot No. LRB No. Used Action  SCREW 6.5MMX25MM - [id number] Screw SCREW 6.5MMX25MM  DEPUY SYNTHES [id number] Left 1 Implanted  ACETABULAR CUP W GRIPTION 54MM - [id number] Plate ACETABULAR CUP W GRIPTION 54MM  DEPUY SYNTHES [id number] Left 1 Implanted  LINER NEUTRAL 54X36MM PLUS 4 - [id number] Hips LINER NEUTRAL 54X36MM PLUS 4  DEPUY SYNTHES [id number] Left 1 Implanted  HEAD CERAMIC 36 PLUS 8.5 12 14  - [id number] Hips HEAD CERAMIC 36 PLUS 8.5 12 14   DEPUY SYNTHES [id number] Left 1 Implanted  STEM FEMORAL SZ 6MM STD ACTIS - [id number] Stem STEM FEMORAL SZ 6MM STD ACTIS  DEPUY ORTH[employer] [id number] Left 1 Implanted

## 2019-03-04 NOTE — Anesthesia Preprocedure Evaluation (Addendum)
Anesthesia Evaluation  Patient identified by MRN, date of birth, ID band Patient awake    Reviewed: Allergy & Precautions, NPO status , Patient's Chart, lab work & pertinent test results  History of Anesthesia Complications Negative for: history of anesthetic complications  Airway Mallampati: II  TM Distance: >3 FB Neck ROM: Full    Dental  (+) Partial Upper, Missing,    Pulmonary COPD, Current Smoker,    Pulmonary exam normal        Cardiovascular negative cardio ROS Normal cardiovascular exam     Neuro/Psych  Headaches, Seizures - (last 5 years ago), Well Controlled,  Anxiety Depression    GI/Hepatic GERD  Controlled,(+)     substance abuse (last crack cocaine "1.5 weeks ago")  cocaine use,   Endo/Other  negative endocrine ROS  Renal/GU negative Renal ROS  negative genitourinary   Musculoskeletal  (+) Arthritis ,   Abdominal   Peds  Hematology negative hematology ROS (+)   Anesthesia Other Findings Day of surgery medications reviewed with patient.  Reproductive/Obstetrics negative OB ROS                            Anesthesia Physical Anesthesia Plan  ASA: II  Anesthesia Plan: Spinal   Post-op Pain Management:    Induction:   PONV Risk Score and Plan: 3 and Treatment may vary due to age or medical condition, Ondansetron, Propofol infusion, Dexamethasone and Midazolam  Airway Management Planned: Natural Airway and Simple Face Mask  Additional Equipment: None  Intra-op Plan:   Post-operative Plan:   Informed Consent: I have reviewed the patients History and Physical, chart, labs and discussed the procedure including the risks, benefits and alternatives for the proposed anesthesia with the patient or authorized representative who has indicated his/her understanding and acceptance.     Dental advisory given  Plan Discussed with: CRNA  Anesthesia Plan Comments:         Anesthesia Quick Evaluation
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
## 2019-03-04 NOTE — Transfer of Care (Signed)
Immediate Anesthesia Transfer of Care Note  Patient: [name]  Procedure(s) Performed: LEFT TOTAL HIP ARTHROPLASTY ANTERIOR APPROACH (Left Hip)  Patient Location: PACU  Anesthesia Type:GA combined with regional for post-op pain  Level of Consciousness: awake, alert  and oriented  Airway & Oxygen Therapy: Patient Spontanous Breathing and Patient connected to face mask oxygen  Post-op Assessment: Report given to RN and Post -op Vital signs reviewed and stable  Post vital signs: Reviewed and stable  Last Vitals:  Vitals Value Taken Time  BP 132/75 03/04/19 1446  Temp 36.6 C 03/04/19 1445  Pulse 86 03/04/19 1451  Resp 17 03/04/19 1451  SpO2 92 % 03/04/19 1451  Vitals shown include unvalidated device data.  Last Pain:  Vitals:   03/04/19 1128  TempSrc:   PainSc: 0-No pain         Complications: No apparent anesthesia complications

## 2019-03-04 NOTE — Anesthesia Procedure Notes (Signed)
Spinal  Patient location during procedure: OR Start time: 03/04/2019 12:35 PM End time: 03/04/2019 12:38 PM Staffing Anesthesiologist: Brennan Bailey, MD Performed: anesthesiologist  Preanesthetic Checklist Completed: patient identified, surgical consent, pre-op evaluation, timeout performed, IV checked, risks and benefits discussed and monitors and equipment checked Spinal Block Patient position: sitting Prep: site prepped and draped and DuraPrep Patient monitoring: continuous pulse ox, blood pressure and heart rate Approach: midline Location: L3-4 Injection technique: single-shot Needle Needle type: Pencan  Needle gauge: 24 G Needle length: 9 cm Additional Notes Risks, benefits, and alternative discussed. Patient gave consent to procedure. Prepped and draped in sitting position. Patient sedated but responsive to voice. Clear CSF obtained after one needle pass. Positive terminal aspiration. No pain or paraesthesias with injection. Patient tolerated procedure well. Vital signs stable. Tawny Asal, MD

## 2019-03-04 NOTE — Anesthesia Procedure Notes (Signed)
Procedure Name: Intubation Date/Time: 03/04/2019 1:08 PM Performed by: Imagene Riches, CRNA Pre-anesthesia Checklist: Patient identified, Emergency Drugs available, Suction available and Patient being monitored Patient Re-evaluated:Patient Re-evaluated prior to induction Oxygen Delivery Method: Circle System Utilized Preoxygenation: Pre-oxygenation with 100% oxygen Induction Type: IV induction Ventilation: Mask ventilation without difficulty Laryngoscope Size: Glidescope and 4 Grade View: Grade I Tube type: Oral Tube size: 7.0 mm Number of attempts: 1 Airway Equipment and Method: Stylet and Oral airway Placement Confirmation: ETT inserted through vocal cords under direct vision,  positive ETCO2 and breath sounds checked- equal and bilateral Secured at: 22 cm Tube secured with: Tape Dental Injury: Teeth and Oropharynx as per pre-operative assessment

## 2019-03-04 NOTE — Care Plan (Signed)
Ortho Bundle Case Management Note  Patient Details  Name: [name] MRN: [id number] Date of Birth: [birth year]    [facility] call to patient to discuss upcoming Left THA with Dr. Erlinda Hong on Monday, 03/04/19. Patient is s/p Right THA on 08/27/18. She is an Ortho bundle patient and is aware of program as well as pre/post- surgery instructions. Has all DME needed for after surgery. Her brother will be coming and staying with her while she recovers for the next several weeks. F/U appointment with Dr. Erlinda Hong already scheduled for 03/19/19. Anticipate HHPT will be needed after short hospital stay. Referral made to [facility] as they provided HHPT to patient for her previous hip arthroplasty. Will continue to follow for CM needs                 DME Arranged:  (Patient has all DME needed from prior hip arthroplasty in May 2020) DME Agency:     HH Arranged:  PT [place]:   [facility]  Additional Comments: Please contact me with any questions of if this plan should need to change.  Jamse Arn, RN, BSN, [employer]  [phone] 03/04/2019, 2:22 PM

## 2019-03-05 ENCOUNTER — Encounter (HOSPITAL_COMMUNITY): Payer: Self-pay | Admitting: Orthopaedic Surgery

## 2019-03-05 DIAGNOSIS — M1612 Unilateral primary osteoarthritis, left hip: Secondary | ICD-10-CM | POA: Diagnosis not present

## 2019-03-05 LAB — BASIC METABOLIC PANEL WITH GFR
Anion gap: 12 (ref 5–15)
BUN: 12 mg/dL (ref 8–23)
CO2: 23 mmol/L (ref 22–32)
Calcium: 8.3 mg/dL — ABNORMAL LOW (ref 8.9–10.3)
Chloride: 101 mmol/L (ref 98–111)
Creatinine, Ser: 0.83 mg/dL (ref 0.44–1.00)
GFR calc Af Amer: >60 mL/min
GFR calc non Af Amer: >60 mL/min
Glucose, Bld: 106 mg/dL — ABNORMAL HIGH (ref 70–99)
Potassium: 3.6 mmol/L (ref 3.5–5.1)
Sodium: 136 mmol/L (ref 135–145)

## 2019-03-05 LAB — CBC
HCT: 33.7 % — ABNORMAL LOW (ref 36.0–46.0)
Hemoglobin: 11.5 g/dL — ABNORMAL LOW (ref 12.0–15.0)
MCH: 32.9 pg (ref 26.0–34.0)
MCHC: 34.1 g/dL (ref 30.0–36.0)
MCV: 96.3 fL (ref 80.0–100.0)
Platelets: 268 10*3/uL (ref 150–400)
RBC: 3.5 MIL/uL — ABNORMAL LOW (ref 3.87–5.11)
RDW: 13.3 % (ref 11.5–15.5)
WBC: 11.5 10*3/uL — ABNORMAL HIGH (ref 4.0–10.5)
nRBC: 0 % (ref 0.0–0.2)

## 2019-03-05 NOTE — Progress Notes (Signed)
Subjective: 1 Day Post-Op Procedure(s) (LRB): LEFT TOTAL HIP ARTHROPLASTY ANTERIOR APPROACH (Left) Patient reports pain as moderate.  Very eager to go home  Objective: Vital signs in last 24 hours: Temp:  [97.8 F (36.6 C)-99.3 F (37.4 C)] 98.2 F (36.8 C) (11/17 0701) Pulse Rate:  [66-93] 88 (11/17 0701) Resp:  [18-21] 18 (11/17 0701) BP: (95-132)/(57-81) 111/68 (11/17 0701) SpO2:  [93 %-100 %] 94 % (11/17 0701)  Intake/Output from previous day: 11/16 0701 - 11/17 0700 In: 800 [I.V.:800] Out: 1600 [Urine:1400; Blood:200] Intake/Output this shift: No intake/output data recorded.  Recent Labs    03/05/19 0537  HGB 11.5*   Recent Labs    03/05/19 0537  WBC 11.5*  RBC 3.50*  HCT 33.7*  PLT 268   Recent Labs    03/05/19 0537  NA 136  K 3.6  CL 101  CO2 23  BUN 12  CREATININE 0.83  GLUCOSE 106*  CALCIUM 8.3*   No results for input(s): LABPT, INR in the last 72 hours.  Neurologically intact Neurovascular intact Sensation intact distally Intact pulses distally Dorsiflexion/Plantar flexion intact Incision: dressing C/D/I No cellulitis present Compartment soft   Assessment/Plan: 1 Day Post-Op Procedure(s) (LRB): LEFT TOTAL HIP ARTHROPLASTY ANTERIOR APPROACH (Left) Advance diet Up with therapy D/C IV fluids Discharge home with home health after second PT session as long as she does well ABLA-mild and stable WBAT LLE       Aundra Dubin 03/05/2019, 8:20 AM

## 2019-03-05 NOTE — Evaluation (Signed)
Physical Therapy Evaluation Patient Details Name: [name] MRN: [id number] DOB: 05-Aug-1950 Today's Date: 03/05/2019   History of Present Illness  Pt is a 68 y/o female who presents s/p L total hip arthroplasty (direct anterior approach) on 03/04/2019.   Clinical Impression  Pt is s/p THA resulting in the deficits listed below (see PT Problem List). At the time of PT eval pt was able to perform transfers and ambulation with gross min guard assist to supervision for safety. Overall gait pattern is poor as pt rushing and wanting to essentially drag the LLE behind her, but was able to drastically improve with step-by-step cues. Tolerance for functional activity is decreased and pt appears uninterested in therapeutic exercise. Recommend HHPT follow-up at d/c to progress mobility further. Acutely, pt will benefit from skilled PT to increase their independence and safety with mobility to allow discharge to the venue listed below.     Follow Up Recommendations Home health PT;Supervision for mobility/OOB    Equipment Recommendations  None recommended by PT    Recommendations for Other Services       Precautions / Restrictions Precautions Precautions: Fall Precaution Comments: Direct anterior approach Restrictions Weight Bearing Restrictions: No      Mobility  Bed Mobility               General bed mobility comments: Pt was received OOB in bathroom when PT arrived. Returned to recliner at end of session.   Transfers Overall transfer level: Needs assistance Equipment used: Rolling walker (2 wheeled) Transfers: Sit to/from Stand Sit to Stand: Supervision         General transfer comment: Light supervision for safety as pt powered up to full stand. Mild uncontrolled descent to chair but kicked LLE out prior to sitting for comfort.   Ambulation/Gait Ambulation/Gait assistance: Min guard Gait Distance (Feet): 100 Feet Assistive device: Rolling walker (2 wheeled) Gait  Pattern/deviations: Step-to pattern;Step-through pattern;Decreased stride length;Trunk flexed;Decreased weight shift to left;Decreased dorsiflexion - left Gait velocity: Decreased Gait velocity interpretation: <1.31 ft/sec, indicative of household ambulator General Gait Details: Step-to pattern progressing to step-through gait pattern with VC's for sequencing, increased heel strike and improved posture.   Stairs            Wheelchair Mobility    Modified Rankin (Stroke Patients Only)       Balance Overall balance assessment: Needs assistance Sitting-balance support: Feet supported;No upper extremity supported Sitting balance-Leahy Scale: Fair Sitting balance - Comments: Mild posterior lean noted at times   Standing balance support: No upper extremity supported;During functional activity Standing balance-Leahy Scale: Poor Standing balance comment: Approaching fair. Able to stand at the sink and wash hands without support, however required RW use for dynamic standing activity or walking.                              Pertinent Vitals/Pain Pain Assessment: Faces Faces Pain Scale: Hurts little more Pain Location: L hip Pain Descriptors / Indicators: Operative site guarding Pain Intervention(s): Limited activity within patient's tolerance;Monitored during session;Repositioned    Home Living Family/patient expects to be discharged to:: Private residence Living Arrangements: Other relatives Available Help at Discharge: Family;Available 24 hours/day(Plan to stay at brother's home) Type of Home: House Home Access: Level entry     Home Layout: Two level;Able to live on main level with bedroom/bathroom Home Equipment: Gilford Rile - 2 wheels;Bedside commode      Prior Function Level of Independence: Independent  Hand Dominance   Dominant Hand: Right    Extremity/Trunk Assessment   Upper Extremity Assessment Upper Extremity Assessment: Overall  WFL for tasks assessed    Lower Extremity Assessment Lower Extremity Assessment: RLE deficits/detail;LLE deficits/detail RLE Deficits / Details: Decreased strength and AROM consistent with THR in 08/2018. LLE Deficits / Details: Decreased strength and AROM consistent with above mentioned surgery.    Cervical / Trunk Assessment Cervical / Trunk Assessment: Normal  Communication   Communication: No difficulties  Cognition Arousal/Alertness: Awake/alert Behavior During Therapy: Anxious(Rushing through session to be able to d/c) Overall Cognitive Status: Within Functional Limits for tasks assessed                                 General Comments: Alert but falling asleep at times during education      General Comments      Exercises Total Joint Exercises Ankle Circles/Pumps: 10 reps Quad Sets: 10 reps Hip ABduction/ADduction: 10 reps Long Arc Quad: 10 reps   Assessment/Plan    PT Assessment Patient needs continued PT services  PT Problem List Decreased strength;Decreased range of motion;Decreased activity tolerance;Decreased balance;Decreased mobility;Decreased knowledge of use of DME;Decreased safety awareness;Decreased knowledge of precautions;Pain       PT Treatment Interventions DME instruction;Gait training;Functional mobility training;Therapeutic activities;Therapeutic exercise;Neuromuscular re-education;Balance training;Patient/family education    PT Goals (Current goals can be found in the Care Plan section)  Acute Rehab PT Goals Patient Stated Goal: Home ASAP PT Goal Formulation: With patient Time For Goal Achievement: 03/12/19 Potential to Achieve Goals: Good    Frequency 7X/week   Barriers to discharge        Co-evaluation               AM-PAC PT "6 Clicks" Mobility  Outcome Measure Help needed turning from your back to your side while in a flat bed without using bedrails?: None Help needed moving from lying on your back to sitting  on the side of a flat bed without using bedrails?: A Little Help needed moving to and from a bed to a chair (including a wheelchair)?: A Little Help needed standing up from a chair using your arms (e.g., wheelchair or bedside chair)?: A Little Help needed to walk in hospital room?: A Little Help needed climbing 3-5 steps with a railing? : A Little 6 Click Score: 19    End of Session Equipment Utilized During Treatment: Gait belt Activity Tolerance: Patient tolerated treatment well Patient left: in chair;with call bell/phone within reach Nurse Communication: Mobility status PT Visit Diagnosis: Unsteadiness on feet (R26.81);Pain Pain - Right/Left: Left Pain - part of body: Hip    Time: [id number] PT Time Calculation (min) (ACUTE ONLY): 17 min   Charges:   PT Evaluation $PT Eval Moderate Complexity: 1 Mod          [name], PT, DPT Acute Rehabilitation Services Pager: [phone] Office: [phone]   [name] 03/05/2019, 10:10 AM

## 2019-03-05 NOTE — Anesthesia Postprocedure Evaluation (Signed)
Anesthesia Post Note  Patient: [name]  Procedure(s) Performed: LEFT TOTAL HIP ARTHROPLASTY ANTERIOR APPROACH (Left Hip)     Patient location during evaluation: PACU Anesthesia Type: General Level of consciousness: awake and alert and oriented Pain management: pain level controlled Vital Signs Assessment: post-procedure vital signs reviewed and stable Respiratory status: spontaneous breathing, nonlabored ventilation and respiratory function stable Cardiovascular status: blood pressure returned to baseline Postop Assessment: no apparent nausea or vomiting and spinal receding Anesthetic complications: no                 Brennan Bailey
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
## 2019-03-05 NOTE — Progress Notes (Signed)
Patient alert and oriented, mae's well, voiding adequate amount of urine, swallowing without difficulty, no c/o pain at time of discharge. Patient discharged home with family. Script and discharged instructions given to patient. Patient and family stated understanding of instructions given. Patient has an appointment with Dr. Erlinda Hong

## 2019-03-06 ENCOUNTER — Telehealth: Payer: Self-pay | Admitting: *Deleted

## 2019-03-06 DIAGNOSIS — K219 Gastro-esophageal reflux disease without esophagitis: Secondary | ICD-10-CM | POA: Diagnosis not present

## 2019-03-06 DIAGNOSIS — J449 Chronic obstructive pulmonary disease, unspecified: Secondary | ICD-10-CM | POA: Diagnosis not present

## 2019-03-06 DIAGNOSIS — M5137 Other intervertebral disc degeneration, lumbosacral region: Secondary | ICD-10-CM | POA: Diagnosis not present

## 2019-03-06 DIAGNOSIS — Z86010 Personal history of colonic polyps: Secondary | ICD-10-CM | POA: Diagnosis not present

## 2019-03-06 DIAGNOSIS — F419 Anxiety disorder, unspecified: Secondary | ICD-10-CM | POA: Diagnosis not present

## 2019-03-06 DIAGNOSIS — F519 Sleep disorder not due to a substance or known physiological condition, unspecified: Secondary | ICD-10-CM | POA: Diagnosis not present

## 2019-03-06 DIAGNOSIS — F17210 Nicotine dependence, cigarettes, uncomplicated: Secondary | ICD-10-CM | POA: Diagnosis not present

## 2019-03-06 DIAGNOSIS — Z9181 History of falling: Secondary | ICD-10-CM | POA: Diagnosis not present

## 2019-03-06 DIAGNOSIS — G4700 Insomnia, unspecified: Secondary | ICD-10-CM | POA: Diagnosis not present

## 2019-03-06 DIAGNOSIS — Z471 Aftercare following joint replacement surgery: Secondary | ICD-10-CM | POA: Diagnosis not present

## 2019-03-06 DIAGNOSIS — E782 Mixed hyperlipidemia: Secondary | ICD-10-CM | POA: Diagnosis not present

## 2019-03-06 DIAGNOSIS — K2270 Barrett's esophagus without dysplasia: Secondary | ICD-10-CM | POA: Diagnosis not present

## 2019-03-06 DIAGNOSIS — Z96643 Presence of artificial hip joint, bilateral: Secondary | ICD-10-CM | POA: Diagnosis not present

## 2019-03-06 DIAGNOSIS — K5730 Diverticulosis of large intestine without perforation or abscess without bleeding: Secondary | ICD-10-CM | POA: Diagnosis not present

## 2019-03-06 DIAGNOSIS — J329 Chronic sinusitis, unspecified: Secondary | ICD-10-CM | POA: Diagnosis not present

## 2019-03-06 DIAGNOSIS — G40909 Epilepsy, unspecified, not intractable, without status epilepticus: Secondary | ICD-10-CM | POA: Diagnosis not present

## 2019-03-06 DIAGNOSIS — F329 Major depressive disorder, single episode, unspecified: Secondary | ICD-10-CM | POA: Diagnosis not present

## 2019-03-06 NOTE — Telephone Encounter (Signed)
I will call her and inform her. Thanks.

## 2019-03-06 NOTE — Telephone Encounter (Signed)
Ortho bundle D/C note completed.

## 2019-03-06 NOTE — Care Plan (Signed)
Patient called in the morning and discussed pain management issues. She verbalized she would like to know if she can take or have anything else besides what was already prescribed for pain. Message sent to Dr. Erlinda Hong, who addressed. Called patient back and states therapy is currently at her home and she is doing well. Updated on instructions per Dr. Erlinda Hong regarding pain medication. Reminded of F/U on 03/19/19. Will call back and check status tomorrow as well.

## 2019-03-06 NOTE — Telephone Encounter (Signed)
Patient called and states she is in severe pain and wants to know if there is anything else she can take. She was prescribed Toradol as well as Robaxin and Oxycodone 5 mg at discharge and reports she is taking all of these as prescribed. Having to take Oxycodone sooner than every 8 hours she informed. Any recommendations? Tnx.

## 2019-03-06 NOTE — Telephone Encounter (Signed)
She can increase her oxycodone to 10 or 15 mg as needed.  We can refill her rx as needed as well.

## 2019-03-07 NOTE — Telephone Encounter (Signed)
Oh, thanks for the update. If we do not have other options for her we can inform her accordingly.

## 2019-03-07 NOTE — Telephone Encounter (Signed)
Good Afternoon  Dr Margarita Rana   Patient has an appointment in the office of Dr Neldon Mc  04-04-2019 she don't want to go there .

## 2019-03-08 ENCOUNTER — Other Ambulatory Visit: Payer: Self-pay | Admitting: Physician Assistant

## 2019-03-08 ENCOUNTER — Telehealth: Payer: Self-pay | Admitting: *Deleted

## 2019-03-08 MED ORDER — OXYCODONE HCL ER 10 MG PO T12A: 10.0000 mg | EXTENDED_RELEASE_TABLET | Freq: Two times a day (BID) | ORAL | 0 refills | Status: DC

## 2019-03-08 MED ORDER — OXYCODONE HCL 5 MG PO CAPS: ORAL_CAPSULE | ORAL | 0 refills | Status: DC

## 2019-03-08 NOTE — Telephone Encounter (Signed)
Ortho bundle call . 

## 2019-03-08 NOTE — Care Plan (Signed)
Call from patient this morning stating she is having much difficulty managing her pain. Would like something that lasts longer than just the few hours her Oxycodone is lasting. Discussed with Dr. Erlinda Hong and his PA. New Rx for Oxycontin sent to the pharmacy along with refill for Oxycodone. Contacted patient back to update that this has been called into her pharmacy and that she should discontinue use of the Toradol while taking the Oxycodone and Oxycontin. She verbalized understanding. Patient called back later in the day stating her entire left leg is now swollen. Having no pain/tenderness/increased redness in the lower extremities. Calf is soft per her. Instructed to ice and elevate leg while continuing to do ankle pumps and exercise taught by therapy during the day. Informed to call back with any other questions or concerns.

## 2019-03-11 ENCOUNTER — Telehealth: Payer: Self-pay | Admitting: *Deleted

## 2019-03-11 NOTE — Telephone Encounter (Signed)
Ortho bundle 7 day call completed. 

## 2019-03-11 NOTE — Telephone Encounter (Signed)
Is she already taking gabapentin?

## 2019-03-11 NOTE — Care Plan (Signed)
[name] received call from patient this morning. States she is in a tremendous amount of pain and would like to know if she can have anything else for pain. Discussed that she received a refill of her Oxycodone and newly prescribed Oxycontin for longer pain control on Friday, 03/08/19 per Dr. Erlinda Hong. She verbalized she only has 3 of the Oxycodone left at this time and 8 of the Oxycontin. Explained most likely has prescribed all he can at this time due to pharmacy rules regarding refills, but CM will send a message. Continues working with therapy. This is a 7 day status check.

## 2019-03-11 NOTE — Telephone Encounter (Signed)
Call from patient this morning stating that she is still in extreme pain. Asked if there was anything else she could take in addition to the Oxycodone 5 mg IR and the OxyContin prescribed on Friday, 03/08/19. She states she only has 3 tablets of Oxycodone left from Fridays Rx refill. Discussed finding other things like ice, elevation,or  her muscle relaxer to help with pain. She requested I send a note to see if there is anything else for her to do. Also complaining of burning/shooting pains in lower extremity. She reports no calf pain. Thanks.

## 2019-03-12 ENCOUNTER — Other Ambulatory Visit: Payer: Self-pay | Admitting: Physician Assistant

## 2019-03-12 ENCOUNTER — Telehealth: Payer: Self-pay | Admitting: *Deleted

## 2019-03-12 ENCOUNTER — Ambulatory Visit: Payer: Medicare Other | Admitting: Orthopaedic Surgery

## 2019-03-12 MED ORDER — GABAPENTIN 100 MG PO CAPS: 100.0000 mg | ORAL_CAPSULE | Freq: Three times a day (TID) | ORAL | 1 refills | Status: DC

## 2019-03-12 MED ORDER — OXYCODONE-ACETAMINOPHEN 5-325 MG PO TABS: 1.0000 | ORAL_TABLET | Freq: Four times a day (QID) | ORAL | 0 refills | Status: DC | PRN

## 2019-03-12 NOTE — Telephone Encounter (Signed)
Ortho bundle call completed. 

## 2019-03-12 NOTE — Care Plan (Signed)
Call again from patient this morning. Discussed medication and note received by PA asking about Gabapentin. Patient confirmed she has not been prescribed Gabapentin/Neurontin. Confirmed this with PA. Reviewed with patient that new Rx of Gabapentin as well as change from Oxycodone to Percocet with Tylenol have now been sent to her pharmacy per PA. Reviewed medication management. Discussed swelling of leg and she verbalized she thinks this has decreased since yesterday. Still no calf pain. Using ice and elevation at this time. Therapy to come today. Updated [facility] staff, so that treating therapist is aware of medication changes.

## 2019-03-12 NOTE — Telephone Encounter (Signed)
Sent in gabapentin/percocet

## 2019-03-12 NOTE — Telephone Encounter (Signed)
I don't think so. It's not on her current medication list.

## 2019-03-19 ENCOUNTER — Ambulatory Visit (INDEPENDENT_AMBULATORY_CARE_PROVIDER_SITE_OTHER): Payer: Medicare Other | Admitting: Physician Assistant

## 2019-03-19 ENCOUNTER — Telehealth: Payer: Self-pay | Admitting: *Deleted

## 2019-03-19 ENCOUNTER — Encounter: Payer: Self-pay | Admitting: Orthopaedic Surgery

## 2019-03-19 ENCOUNTER — Other Ambulatory Visit: Payer: Self-pay

## 2019-03-19 DIAGNOSIS — Z96642 Presence of left artificial hip joint: Secondary | ICD-10-CM

## 2019-03-19 MED ORDER — OXYCODONE-ACETAMINOPHEN 5-325 MG PO TABS: 1.0000 | ORAL_TABLET | Freq: Four times a day (QID) | ORAL | 0 refills | Status: DC | PRN
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
## 2019-03-19 NOTE — Care Plan (Signed)
[name] met with patient while in office today for her 2 week post-op appointment. She was seen by Tawanna Cooler, PA-C for Dr. Erlinda Hong. She is ambulating with a FWW and states she is doing well. Burning type pain reported last week has improved with the addition of the Gabapentin prescribed by MD. Refill of pain medication e-faxed to patient's pharmacy today. Incision looks within normal limits with no s/s of infection. Sutures removed. Patient requested if she could extend home health PT by another week since she has difficulty with transportation and lives alone. PA verbally approved and [name] provided verbal order to home health liaison therapist. Will continue with HHPT through remainder of this week and next. No other issues noted at this time.

## 2019-03-19 NOTE — Telephone Encounter (Signed)
Ortho bundle in office visit completed. 

## 2019-03-22 DIAGNOSIS — F411 Generalized anxiety disorder: Secondary | ICD-10-CM | POA: Diagnosis not present

## 2019-03-22 DIAGNOSIS — F319 Bipolar disorder, unspecified: Secondary | ICD-10-CM | POA: Diagnosis not present

## 2019-03-25 ENCOUNTER — Other Ambulatory Visit: Payer: Self-pay | Admitting: Physician Assistant

## 2019-03-25 ENCOUNTER — Telehealth: Payer: Self-pay | Admitting: *Deleted

## 2019-03-25 DIAGNOSIS — R490 Dysphonia: Secondary | ICD-10-CM | POA: Insufficient documentation

## 2019-03-25 MED ORDER — OXYCODONE-ACETAMINOPHEN 5-325 MG PO TABS: 1.0000 | ORAL_TABLET | Freq: Two times a day (BID) | ORAL | 0 refills | Status: DC | PRN

## 2019-03-25 NOTE — Telephone Encounter (Signed)
I called and updated her and informed she should be weaning from her pain medications at this point. Thank you.

## 2019-03-25 NOTE — Telephone Encounter (Signed)
Writing to take half as often as she really needs to wean.  Sent in to [place] [facility] [address]

## 2019-03-25 NOTE — Telephone Encounter (Signed)
Received call from patient requesting refill of most recent pain medication. She requested this be sent to a new [facility] [address]. I will change pharmacy in our system. Thanks.

## 2019-03-26 NOTE — Telephone Encounter (Signed)
Ok, thanks.

## 2019-03-29 ENCOUNTER — Other Ambulatory Visit: Payer: Self-pay | Admitting: Physician Assistant

## 2019-03-29 ENCOUNTER — Telehealth: Payer: Self-pay | Admitting: *Deleted

## 2019-03-29 MED ORDER — HYDROCODONE-ACETAMINOPHEN 5-325 MG PO TABS: 1.0000 | ORAL_TABLET | Freq: Two times a day (BID) | ORAL | 0 refills | Status: DC | PRN

## 2019-03-29 NOTE — Telephone Encounter (Signed)
CM medication question from patient. Discussed refill per PA.

## 2019-03-29 NOTE — Care Plan (Signed)
Received call from patient requesting call back again to discuss medications. Sent message to Dr. Phoebe Sharps PA informing that patient and CM had spoken yesterday about medications and how to manage her pain. Dr. Phoebe Sharps PA prescribed Norco #30 to her pharmacy. Also discussed to be cautious with taking extra Acetaminophen. CM contacted patient and updated that an Rx for Norco has been called into her pharmacy.

## 2019-04-01 ENCOUNTER — Telehealth: Payer: Self-pay | Admitting: *Deleted

## 2019-04-01 NOTE — Telephone Encounter (Signed)
Patient called and states that she could only pick up 1/2 of her Rx for Norco prescribed Friday due to pharmacy only having 11 tablets in stock. Called pharmacy to verify this information. Will need a new Rx for the remaining 19 tablets to make 30 total, per the pharmacist, because of [place] regulations. Would you please refill the remainder as well as her Methocarbamol. Thank you!

## 2019-04-02 ENCOUNTER — Other Ambulatory Visit: Payer: Self-pay | Admitting: Physician Assistant

## 2019-04-02 ENCOUNTER — Other Ambulatory Visit (HOSPITAL_COMMUNITY): Payer: Medicare Other

## 2019-04-02 MED ORDER — HYDROCODONE-ACETAMINOPHEN 5-325 MG PO TABS: 1.0000 | ORAL_TABLET | Freq: Two times a day (BID) | ORAL | 0 refills | Status: DC | PRN

## 2019-04-02 MED ORDER — METHOCARBAMOL 750 MG PO TABS: 750.0000 mg | ORAL_TABLET | Freq: Two times a day (BID) | ORAL | 0 refills | Status: DC | PRN

## 2019-04-02 NOTE — Telephone Encounter (Signed)
Sent in

## 2019-04-02 NOTE — Telephone Encounter (Signed)
I'll call her and let her know. Also, she requested the muscle relaxer refill as well. She hasn't had a refill on that one yet. Thanks.

## 2019-04-03 DIAGNOSIS — K219 Gastro-esophageal reflux disease without esophagitis: Secondary | ICD-10-CM | POA: Insufficient documentation

## 2019-04-04 ENCOUNTER — Telehealth: Payer: Self-pay

## 2019-04-04 ENCOUNTER — Other Ambulatory Visit: Payer: Self-pay

## 2019-04-04 ENCOUNTER — Telehealth: Payer: Self-pay | Admitting: *Deleted

## 2019-04-04 ENCOUNTER — Encounter: Payer: Self-pay | Admitting: Allergy

## 2019-04-04 ENCOUNTER — Ambulatory Visit (INDEPENDENT_AMBULATORY_CARE_PROVIDER_SITE_OTHER): Payer: Medicare Other | Admitting: Allergy

## 2019-04-04 VITALS — BP 112/74 | HR 84 | Temp 98.4°F | Resp 18 | Ht 65.0 in | Wt 148.4 lb

## 2019-04-04 DIAGNOSIS — J310 Chronic rhinitis: Secondary | ICD-10-CM

## 2019-04-04 DIAGNOSIS — K2100 Gastro-esophageal reflux disease with esophagitis, without bleeding: Secondary | ICD-10-CM

## 2019-04-04 DIAGNOSIS — J3089 Other allergic rhinitis: Secondary | ICD-10-CM

## 2019-04-04 DIAGNOSIS — T485X5A Adverse effect of other anti-common-cold drugs, initial encounter: Secondary | ICD-10-CM | POA: Diagnosis not present
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
## 2019-04-04 NOTE — Telephone Encounter (Signed)
30 day Ortho bundle call and survey completed. 

## 2019-04-04 NOTE — Patient Instructions (Addendum)
Nasal congestion  - environmental allergy skin testing today is positive to weed pollens, tree pollens, molds and cat  - allergen avoidance measures discussed/handouts provided  - discussed tapering Afrin as this can cause rebound nasal congestion with chronic use.  Every 3-5 days try using 1 squirt less and less until you are off  - will have you try Xhance nasal spray (fluticasone) 2 sprays each nostril twice a day.  This is a special nasal spray device that allows for deeper deposition of the medication into your sinuses for better effect.  I am hopeful that this will help you be able to wean down and off of Afrin use.    - can try use of Azelastine (spray you already have) 2 sprays each nostril twice a day for control of nasal drainage.    Reflux  - continue use of Protonix twice a day as directed by Dr. Janace Hoard  Follow-up 3-4 months or sooner if needed

## 2019-04-04 NOTE — Care Plan (Signed)
[name] call to patient for 30 day ortho bundle call. States she is still in pain. Only walking around inside her house to bathroom and back to sofa. Explained that she will have to increase activities to engage muscles and most likely begin to hurt less. Refill Norco this week and asking for refill or change to Tramadol. States she will give it the weekend to see if pain improves.  30 day Patient Satisfaction Survey completed. Using TOM/THN survey provided in place of 10 question survey in [place]. 1. Before surgery, I was provided sufficient education regarding my surgery and the bundle program. Patient answer: Strongly agree 2. I was satisfied with the care provided by the nurse at the facility where my surgery was performed. Patient answer:Strongly agree 3. Following surgery, I received sufficient postoperative care instructions.  Patient answer:Strongly agree 4. I would recommend my surgeon and this bundle program to others.  Patient answer:Strongly agree Other comments:

## 2019-04-04 NOTE — Telephone Encounter (Signed)
Entered in error. Disregard.

## 2019-04-05 ENCOUNTER — Telehealth: Payer: Self-pay | Admitting: *Deleted

## 2019-04-05 ENCOUNTER — Encounter: Payer: Self-pay | Admitting: Orthopaedic Surgery

## 2019-04-05 ENCOUNTER — Ambulatory Visit (INDEPENDENT_AMBULATORY_CARE_PROVIDER_SITE_OTHER): Payer: Medicare Other | Admitting: Orthopaedic Surgery

## 2019-04-05 ENCOUNTER — Ambulatory Visit (INDEPENDENT_AMBULATORY_CARE_PROVIDER_SITE_OTHER): Payer: Medicare Other

## 2019-04-05 VITALS — Ht 65.0 in | Wt 148.4 lb

## 2019-04-05 DIAGNOSIS — Z96642 Presence of left artificial hip joint: Secondary | ICD-10-CM

## 2019-04-05 MED ORDER — IBUPROFEN 800 MG PO TABS: 800.0000 mg | ORAL_TABLET | Freq: Three times a day (TID) | ORAL | 2 refills | Status: DC | PRN

## 2019-04-05 MED ORDER — TRAMADOL HCL 50 MG PO TABS: 50.0000 mg | ORAL_TABLET | Freq: Three times a day (TID) | ORAL | 2 refills | Status: DC | PRN

## 2019-04-05 NOTE — Telephone Encounter (Signed)
Ortho bundle call . 

## 2019-04-05 NOTE — Care Plan (Signed)
2 voice mails left by patient late in afternoon on 04/04/19 indicate that she has bent down and felt a "pop and crack" with pain. Received phone call from Dr. Phoebe Sharps PA also stating that the patient has called and left a message at the office. CM and PA agreed that patient will need to be seen in clinic on Friday (today). CM contacted patient as well as PA and left messages indicating that someone will be in touch with her to schedule an appointment first thing Friday morning. Call to patient this morning and updated that she should come to clinic this morning as soon as possible and she will be worked in. Patient working on getting a ride now.
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
## 2019-04-15 ENCOUNTER — Telehealth: Payer: Self-pay | Admitting: *Deleted

## 2019-04-15 NOTE — Telephone Encounter (Signed)
Patient called and states [name] has helped her and would like another sample to help her until her visit in January.  Patient is unable to afford medication at this time. Xhance sample left for patient to pick up when she can get transportation to the office.

## 2019-04-17 ENCOUNTER — Encounter: Payer: Self-pay | Admitting: Physician Assistant

## 2019-04-17 ENCOUNTER — Ambulatory Visit: Payer: Self-pay

## 2019-04-17 ENCOUNTER — Other Ambulatory Visit: Payer: Self-pay

## 2019-04-17 ENCOUNTER — Ambulatory Visit (INDEPENDENT_AMBULATORY_CARE_PROVIDER_SITE_OTHER): Payer: Medicare Other | Admitting: Orthopaedic Surgery

## 2019-04-17 DIAGNOSIS — Z96642 Presence of left artificial hip joint: Secondary | ICD-10-CM

## 2019-04-17 MED ORDER — METHOCARBAMOL 500 MG PO TABS: 500.0000 mg | ORAL_TABLET | Freq: Every day | ORAL | 0 refills | Status: DC | PRN

## 2019-04-17 MED ORDER — TRAMADOL HCL 50 MG PO TABS: 50.0000 mg | ORAL_TABLET | Freq: Every day | ORAL | 0 refills | Status: DC | PRN
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
## 2019-04-23 ENCOUNTER — Telehealth: Payer: Self-pay | Admitting: Allergy

## 2019-04-23 ENCOUNTER — Other Ambulatory Visit: Payer: Self-pay | Admitting: Physician Assistant

## 2019-04-23 ENCOUNTER — Telehealth: Payer: Self-pay | Admitting: *Deleted

## 2019-04-23 ENCOUNTER — Other Ambulatory Visit: Payer: Self-pay | Admitting: Orthopaedic Surgery

## 2019-04-23 MED ORDER — TRAMADOL HCL 50 MG PO TABS: 50.0000 mg | ORAL_TABLET | Freq: Every day | ORAL | 0 refills | Status: DC | PRN

## 2019-04-23 NOTE — Telephone Encounter (Signed)
Pt called and said that the xhance was working and now it is not. And wanted to know why it would stop working now. [place]. [phone].

## 2019-04-23 NOTE — Telephone Encounter (Signed)
Patient called requesting a refill from Dr. Phoebe Sharps nurse.PAtient stated prescription needed is tramadol. Patient phone number is [phone].

## 2019-04-23 NOTE — Telephone Encounter (Signed)
Called patient.  Patient will take a break from [place].  She is not doing saline nasal rinse but has a bottle of Simply Saline.  She will start using this.  What nasal spray can she use in place of [place] for now.  She is using azelastine 2 sprays each nostril twice a day after using the Xhance. Please call any new rx in to [facility]. Please call patient at [phone].

## 2019-04-23 NOTE — Telephone Encounter (Signed)
Call to patient and updated that CM has spoken with PA and a refill of Tramadol has been sent to her pharmacy as requested.

## 2019-04-23 NOTE — Telephone Encounter (Signed)
Ok thanks.  She could use any nasal steroid spray at this time however likely will find it to be less effective than the [place].   See if her insurance covers either Qnasl or [place].   If not then can use either nasacort, rhinocort which are OTC. She has tried standard Flonase without much relief.   Please ensure she is not using more of the Afrin at this time.  Still want her to wean down and hopefully off if have not done so already.

## 2019-04-23 NOTE — Telephone Encounter (Signed)
Qnasal or Zetonna not under formulary list and will need to do Nasacort or Rhinocort OTC

## 2019-04-23 NOTE — Telephone Encounter (Signed)
Dr Padgett please advise 

## 2019-04-23 NOTE — Telephone Encounter (Signed)
How is she using it?  Taking 2 sprays BID or something different? Maybe if she takes a bit of a break from use and resume in several weeks may notice a difference.  Also if she is able to perform nasal saline rinses and is not currently would recommend that too prior to use of any nasal spray.

## 2019-04-24 ENCOUNTER — Telehealth: Payer: Self-pay | Admitting: *Deleted

## 2019-04-24 MED ORDER — TRIAMCINOLONE ACETONIDE 55 MCG/ACT NA AERO: 2.0000 | INHALATION_SPRAY | Freq: Two times a day (BID) | NASAL | 5 refills | Status: DC | PRN

## 2019-04-24 NOTE — Telephone Encounter (Signed)
thanks

## 2019-04-24 NOTE — Telephone Encounter (Signed)
Noted- I will make her aware. Thanks!

## 2019-04-24 NOTE — Telephone Encounter (Signed)
Patient called and states pharmacist would be willing to refill Tramadol early if you or Dr. Erlinda Hong contacted them directly. [place]. [phone]. Thanks.

## 2019-04-24 NOTE — Telephone Encounter (Signed)
At this point she is almost 2 months out from surgery.  I think she can take otc meds and wait for appropriate time for refill

## 2019-04-24 NOTE — Telephone Encounter (Signed)
Per Dr. Nelva Bush I am sending in Nasacort 2 sprays 2 times daily as needed. Patient is only using Afrin at night as needed and as a last resort.

## 2019-04-24 NOTE — Addendum Note (Signed)
Addended by: Lucrezia Starch I on: 04/24/2019 10:59 AM   Modules accepted: Orders

## 2019-04-25 ENCOUNTER — Telehealth: Payer: Self-pay | Admitting: *Deleted

## 2019-04-25 MED ORDER — ZETONNA 37 MCG/ACT NA AERS: 1.0000 | INHALATION_SPRAY | Freq: Every day | NASAL | 5 refills | Status: DC

## 2019-04-25 NOTE — Telephone Encounter (Signed)
[name] was sent to pharmacy to see if insurance will cover. Patient was advised that insurance is only wanting to pay for Flonase which seems to be ineffective. Patient was advise if this spray is not covered she will need to try Nasacort or Rhinocort OTC.

## 2019-04-25 NOTE — Telephone Encounter (Signed)
Patient called and would like a prescription for a nasal spray that her insurance will cover.   Please advise.

## 2019-04-25 NOTE — Telephone Encounter (Signed)
Patient called stating that Nasacort is no longer covered by her insurance. Patient is aware that it is over the counter. However, to save on prescription costs, patient is wondering if we could possibly call in an alternative.

## 2019-04-25 NOTE — Telephone Encounter (Signed)
Documented on last note to try [place] if covered by insurance if not OTC Nasacort or Rhinocort is recommended

## 2019-04-25 NOTE — Telephone Encounter (Signed)
Do we know what steroid nasal sprays her insurance covers?   Available options she could try would be Nasacort, Nasonex, Rhinocort, Qnasl, Zetonna

## 2019-04-28 ENCOUNTER — Telehealth: Payer: Self-pay | Admitting: Neurology

## 2019-04-28 NOTE — Telephone Encounter (Signed)
I received a message from answering serice "Need to switch from [place] to [place] for Lamictal, has to be mailed"  I called x 2 and got voice mail.   With second voicemail I left message that the office was closed and that office would call on Monday

## 2019-04-29 ENCOUNTER — Telehealth: Payer: Self-pay | Admitting: *Deleted

## 2019-04-29 ENCOUNTER — Telehealth: Payer: Self-pay | Admitting: Family Medicine

## 2019-04-29 MED ORDER — NICOTINE 21 MG/24HR TD PT24: 21.0000 mg | MEDICATED_PATCH | Freq: Every day | TRANSDERMAL | 1 refills | Status: DC

## 2019-04-29 NOTE — Telephone Encounter (Signed)
Done

## 2019-04-29 NOTE — Telephone Encounter (Signed)
Patient called saying she started a program to quit smoking and she finished the program. Patient states she was prescribed 21mg  of nicotine patches and patient states she is out of patches. Patient states that the company she is working with will not send any refills until next week and patient states she needs them now because she quit smoking on Dec. 30,2020. Please f/u. Patient uses [facility] representative on [employer].

## 2019-04-29 NOTE — Telephone Encounter (Signed)
Will route to PCP for review. 

## 2019-04-29 NOTE — Telephone Encounter (Addendum)
[facility], spoke with Aldona Bar to discuss lamictal Rx coming to their pharmacy. She stated they have a Rx for Lamictal. She ran it through insurance on file, got message:   Too early to refill, filled at different pharmacy. I thankked her; will call patient. Called patient and LVM requesting call back.

## 2019-04-29 NOTE — Telephone Encounter (Signed)
We should try some muscle relaxers.  Flexeril ok with her?

## 2019-04-29 NOTE — Telephone Encounter (Signed)
Patient called today and states she continues to have "awful" pain from the Right hip. She is having difficulty just getting around her house she states and the pain is not improving with time. We discussed that the pharmacy will not fill her Rx of Tramadol as she took the last Rx sooner than prescribed and she cannot yet pick up the new as it is too soon to refill.  She is taking the Ibuprofen as prescribed, but wants to know what else can be done. Any recommendations? Thank you.

## 2019-04-30 ENCOUNTER — Telehealth: Payer: Self-pay | Admitting: *Deleted

## 2019-04-30 MED ORDER — CYCLOBENZAPRINE HCL 5 MG PO TABS: 5.0000 mg | ORAL_TABLET | Freq: Three times a day (TID) | ORAL | 3 refills | Status: DC | PRN

## 2019-04-30 NOTE — Telephone Encounter (Signed)
Ortho bundle call to patient; 60 days-to discuss medication refill.

## 2019-04-30 NOTE — Telephone Encounter (Signed)
Patient is requesting patches as she is out.

## 2019-04-30 NOTE — Telephone Encounter (Signed)
I sent some in 

## 2019-04-30 NOTE — Telephone Encounter (Signed)
She's been on Robaxin since surgery. Maybe changing it up will help.

## 2019-05-01 NOTE — Telephone Encounter (Signed)
These were filled by PCP.

## 2019-05-03 ENCOUNTER — Telehealth: Payer: Self-pay | Admitting: Orthopaedic Surgery

## 2019-05-03 NOTE — Telephone Encounter (Signed)
Ok for this? 

## 2019-05-03 NOTE — Telephone Encounter (Signed)
Patient called.  She needs someone to call and okay her prescription for tramadol to be given a few days early.  Call back number: [phone]

## 2019-05-06 ENCOUNTER — Other Ambulatory Visit: Payer: Self-pay | Admitting: Physician Assistant

## 2019-05-06 NOTE — Telephone Encounter (Signed)
I spoke to [name] about her Friday and denied early refill.  She is dependent on these.  I sent in #20 on 1/5 to be taken once daily prn.  Not due until 1/25.  Will you make sure she is aware

## 2019-05-06 NOTE — Telephone Encounter (Signed)
done

## 2019-05-07 ENCOUNTER — Other Ambulatory Visit: Payer: Self-pay | Admitting: Family Medicine

## 2019-05-07 DIAGNOSIS — M25551 Pain in right hip: Secondary | ICD-10-CM

## 2019-05-10 ENCOUNTER — Ambulatory Visit: Payer: Medicare Other | Admitting: Allergy

## 2019-05-11 ENCOUNTER — Emergency Department (HOSPITAL_COMMUNITY)
Admission: EM | Admit: 2019-05-11 | Discharge: 2019-05-11 | Disposition: A | Discharge status: Home / Self Care | Payer: Medicare Other | Attending: Emergency Medicine | Admitting: Emergency Medicine

## 2019-05-11 ENCOUNTER — Other Ambulatory Visit: Payer: Self-pay

## 2019-05-11 DIAGNOSIS — Z79899 Other long term (current) drug therapy: Secondary | ICD-10-CM | POA: Diagnosis not present

## 2019-05-11 DIAGNOSIS — K046 Periapical abscess with sinus: Secondary | ICD-10-CM | POA: Insufficient documentation

## 2019-05-11 DIAGNOSIS — F17210 Nicotine dependence, cigarettes, uncomplicated: Secondary | ICD-10-CM | POA: Diagnosis not present

## 2019-05-11 DIAGNOSIS — Z7982 Long term (current) use of aspirin: Secondary | ICD-10-CM | POA: Diagnosis not present

## 2019-05-11 DIAGNOSIS — K047 Periapical abscess without sinus: Secondary | ICD-10-CM | POA: Diagnosis not present

## 2019-05-11 DIAGNOSIS — K0889 Other specified disorders of teeth and supporting structures: Secondary | ICD-10-CM | POA: Diagnosis present

## 2019-05-11 MED ORDER — HYDROCODONE-ACETAMINOPHEN 5-325 MG PO TABS
1.0000 | ORAL_TABLET | Freq: Once | ORAL | Status: AC
  Administered 2019-05-11: 1 via ORAL
  Filled 2019-05-11: qty 1
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
## 2019-05-11 NOTE — ED Triage Notes (Signed)
Pt presents to the ED with complaint of dental pain and swelling to the right side of the cheek. Patient was seen by a dentist this past week and placed on tylenol 3, motrin and clindamyacin 300mg  Q8h.

## 2019-05-11 NOTE — Discharge Instructions (Signed)
Please continue taking your antibiotic as previously prescribed.  Call and follow-up closely with your dentist for further management of your dental infection.  Return if you have any concerns.

## 2019-05-13 ENCOUNTER — Telehealth: Payer: Self-pay | Admitting: *Deleted

## 2019-05-13 NOTE — Telephone Encounter (Signed)
Patient called today and states she has Cracked a tooth. Her dentist has started her on Clindamycin 300 mg every 8 hours (she started this on 05/08/19). She sees Oral surgery on 05/22/19 for tooth extraction. She wanted to know if she would need any other dental antibiotic coverage related to the hip replacement on 03/04/19. Thanks-

## 2019-05-14 NOTE — Telephone Encounter (Signed)
Clindamycin is perfect

## 2019-05-14 NOTE — Telephone Encounter (Signed)
I'll let her know that no other coverage is needed. Thanks.

## 2019-05-24 ENCOUNTER — Ambulatory Visit: Payer: Medicare Other | Admitting: Physician Assistant

## 2019-05-28 ENCOUNTER — Telehealth: Payer: Self-pay | Admitting: *Deleted

## 2019-05-28 ENCOUNTER — Ambulatory Visit (INDEPENDENT_AMBULATORY_CARE_PROVIDER_SITE_OTHER): Payer: Medicare Other | Admitting: Physician Assistant

## 2019-05-28 ENCOUNTER — Other Ambulatory Visit: Payer: Self-pay

## 2019-05-28 ENCOUNTER — Other Ambulatory Visit: Payer: Self-pay | Admitting: *Deleted

## 2019-05-28 DIAGNOSIS — Z96642 Presence of left artificial hip joint: Secondary | ICD-10-CM

## 2019-05-28 DIAGNOSIS — M1612 Unilateral primary osteoarthritis, left hip: Secondary | ICD-10-CM

## 2019-05-28 MED ORDER — TRAMADOL HCL 50 MG PO TABS: 50.0000 mg | ORAL_TABLET | Freq: Two times a day (BID) | ORAL | 0 refills | Status: DC | PRN
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
## 2019-05-28 NOTE — Progress Notes (Signed)
[name] spoke with PA for Dr. Erlinda Hong, who saw patient in clinic today. Patient continues to have pain and tightness in the Left hip s/p Left THA on 03/04/19. PA gave verbal order to begin Physical therapy to see if this can help with pain. [name] placed order for physical therapy with [facility] location.

## 2019-05-28 NOTE — Telephone Encounter (Signed)
Ortho bundle patient and therapy order placed at request of Tawanna Cooler, PA-C.

## 2019-05-29 DIAGNOSIS — R490 Dysphonia: Secondary | ICD-10-CM | POA: Diagnosis not present

## 2019-05-29 DIAGNOSIS — K219 Gastro-esophageal reflux disease without esophagitis: Secondary | ICD-10-CM | POA: Diagnosis not present

## 2019-06-03 ENCOUNTER — Telehealth: Payer: Self-pay | Admitting: *Deleted

## 2019-06-03 ENCOUNTER — Ambulatory Visit: Payer: Medicare Other | Admitting: Family Medicine

## 2019-06-03 NOTE — Care Plan (Signed)
[name] reached out to check on patient at 90 days post-op from her most recent hip replacement. Overall, she states she is doing well. Last week she was seen in office and Dr. Phoebe Sharps PA requested she have a few visits of OPPT to help with strengthening and stretching of the tissues/muscles surrounding the hip due to continued complaints of tightness and pain. Overall, patient states she has improved since surgery, but still has some mild pain generally. She is scheduled to begin OPPT on 06/11/19 with [place] at [facility] location.

## 2019-06-03 NOTE — Telephone Encounter (Signed)
90 day Ortho bundle call completed. 

## 2019-06-05 ENCOUNTER — Ambulatory Visit: Payer: Medicare Other | Attending: Family Medicine | Admitting: Family Medicine

## 2019-06-05 DIAGNOSIS — N951 Menopausal and female climacteric states: Secondary | ICD-10-CM

## 2019-06-05 DIAGNOSIS — N3281 Overactive bladder: Secondary | ICD-10-CM

## 2019-06-05 DIAGNOSIS — E782 Mixed hyperlipidemia: Secondary | ICD-10-CM

## 2019-06-05 DIAGNOSIS — G2581 Restless legs syndrome: Secondary | ICD-10-CM | POA: Diagnosis not present

## 2019-06-05 DIAGNOSIS — L299 Pruritus, unspecified: Secondary | ICD-10-CM

## 2019-06-05 DIAGNOSIS — G4709 Other insomnia: Secondary | ICD-10-CM

## 2019-06-05 MED ORDER — HYDROXYZINE HCL 25 MG PO TABS: 25.0000 mg | ORAL_TABLET | Freq: Three times a day (TID) | ORAL | 1 refills | Status: DC | PRN

## 2019-06-05 MED ORDER — ROPINIROLE HCL 1 MG PO TABS: 1.0000 mg | ORAL_TABLET | Freq: Every day | ORAL | 6 refills | Status: DC

## 2019-06-05 MED ORDER — CLONIDINE HCL 0.1 MG PO TABS: 0.1000 mg | ORAL_TABLET | Freq: Every day | ORAL | 6 refills | Status: DC

## 2019-06-05 MED ORDER — SIMVASTATIN 40 MG PO TABS: 40.0000 mg | ORAL_TABLET | Freq: Every evening | ORAL | 6 refills | Status: DC

## 2019-06-05 MED ORDER — TRAZODONE HCL 100 MG PO TABS: 200.0000 mg | ORAL_TABLET | Freq: Every evening | ORAL | 6 refills | Status: DC | PRN
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
## 2019-06-05 NOTE — Progress Notes (Signed)
Patient has been called and DOB has been verified. Patient has been screened and transferred to PCP to start phone visit.     

## 2019-06-06 ENCOUNTER — Encounter: Payer: Self-pay | Admitting: Family Medicine

## 2019-06-10 ENCOUNTER — Telehealth: Payer: Self-pay | Admitting: *Deleted

## 2019-06-10 ENCOUNTER — Other Ambulatory Visit: Payer: Self-pay | Admitting: Physician Assistant

## 2019-06-10 MED ORDER — TRAMADOL HCL 50 MG PO TABS: 50.0000 mg | ORAL_TABLET | Freq: Two times a day (BID) | ORAL | 0 refills | Status: DC | PRN

## 2019-06-10 NOTE — Telephone Encounter (Signed)
Ok, make sure she is aware this is last one

## 2019-06-10 NOTE — Telephone Encounter (Signed)
Received call from patient requesting refill of Tramadol. I reviewed last progress note on 05/28/19 that states she should use sparingly. She has had to cancel her therapy appointment due to her scheduled Covid vaccine on same day and is rescheduled for her initial Outpatient therapy eval on 06/17/19. She asked if you'd be willing to refill her Tramadol so that she has when she starts therapy. Thanks.

## 2019-06-11 ENCOUNTER — Ambulatory Visit: Payer: Medicare Other | Admitting: Physical Therapy

## 2019-06-11 NOTE — Telephone Encounter (Signed)
I will make her aware to use very sparingly and that this is last Rx. Thank you.

## 2019-06-13 ENCOUNTER — Ambulatory Visit: Payer: Medicare Other | Admitting: Allergy

## 2019-06-17 ENCOUNTER — Ambulatory Visit: Payer: Medicare Other

## 2019-06-17 ENCOUNTER — Telehealth: Payer: Self-pay | Admitting: Family Medicine

## 2019-06-17 MED ORDER — GABAPENTIN 300 MG PO CAPS: 300.0000 mg | ORAL_CAPSULE | Freq: Every day | ORAL | 3 refills | Status: DC

## 2019-06-17 NOTE — Telephone Encounter (Signed)
Patient called saying she was prescribed rOPINIRole (REQUIP) 1 MG tablet and the medication is not helping her. Patient would like to know is she can be prescribed something else. Please f/u

## 2019-06-17 NOTE — Telephone Encounter (Signed)
States that medication is not working.

## 2019-06-17 NOTE — Telephone Encounter (Signed)
I have changed from Requip to Gabapentin

## 2019-06-18 ENCOUNTER — Telehealth: Payer: Self-pay

## 2019-06-18 DIAGNOSIS — N898 Other specified noninflammatory disorders of vagina: Secondary | ICD-10-CM

## 2019-06-18 NOTE — Telephone Encounter (Signed)
Patient states that Troxidone is not helping her sleep at night.

## 2019-06-18 NOTE — Telephone Encounter (Signed)
Patient was called and informed of medication change  

## 2019-06-19 NOTE — Telephone Encounter (Signed)
I just started her on gabapentin for her 2 days ago which has a sedating side effect and combined with trazodone and clonidine all contribute to sedation.  I would give it some time prior to making any regimen changes.

## 2019-06-19 NOTE — Telephone Encounter (Signed)
Patient states that she will try medication for 1 week and let us know if it helps her sleep.

## 2019-06-26 ENCOUNTER — Ambulatory Visit (INDEPENDENT_AMBULATORY_CARE_PROVIDER_SITE_OTHER): Payer: Medicare Other | Admitting: Allergy

## 2019-06-26 ENCOUNTER — Encounter: Payer: Self-pay | Admitting: Allergy

## 2019-06-26 ENCOUNTER — Other Ambulatory Visit: Payer: Self-pay

## 2019-06-26 VITALS — BP 122/76 | HR 90 | Temp 98.0°F | Resp 18

## 2019-06-26 DIAGNOSIS — J310 Chronic rhinitis: Secondary | ICD-10-CM

## 2019-06-26 DIAGNOSIS — J3089 Other allergic rhinitis: Secondary | ICD-10-CM | POA: Diagnosis not present

## 2019-06-26 DIAGNOSIS — T485X5A Adverse effect of other anti-common-cold drugs, initial encounter: Secondary | ICD-10-CM

## 2019-06-26 DIAGNOSIS — T485X5D Adverse effect of other anti-common-cold drugs, subsequent encounter: Secondary | ICD-10-CM

## 2019-06-26 DIAGNOSIS — K2100 Gastro-esophageal reflux disease with esophagitis, without bleeding: Secondary | ICD-10-CM

## 2019-06-26 NOTE — Patient Instructions (Signed)
Nasal congestion  - improving from initial visit  - continue avoidance measures for weed pollens, tree pollens, molds and cat  - use Afrin 2 sprays each nostril only if you can not breathe out of both nostril only for 2-3 days maximum  - do the following regimen for the next week: Flonase 2 sprays each nostril once day and Nasacort 2 sprays each nostril once a day.   After a week decrease down to Flonase 1 spray each nostril once a day and Nasacort 1 spray each nostril once a day.   will have you try Xhance nasal spray (fluticasone) 2 sprays each nostril twice a day.   - continue use of Azelastine 2 sprays each nostril twice a day for control of nasal drainage.    Reflux  - use of Protonix once a day (take around same time daily)   Follow-up 4-6 months or sooner if needed

## 2019-06-26 NOTE — Progress Notes (Signed)
Follow-up Note  RE: [name] MRN: [id number] DOB: 27-Mar-1951 Date of Office Visit: 06/26/2019   History of present illness: [name] is a 69 y.o. female presenting today for follow-up of allergic rhinitis with rhinitis medicmentosa and reflux.  She was last seen in the office on 04/04/2019 by msyelf.  Since this visit she did stop smoking on 04/17/19. She states she is now weaning down on her nicotine patches.  She also has weaned her use of Afrin down significantly and states she is only using now every 1-2 weeks for severe congestion.  This is down from many sprays daily.   She states the [place] device did work initially for the first 2 weeks or so and then seemed to become ineffective.  Her current regimen right now is Flonase 1 spray each nostril in the mornings and Nasacort 1 spray each nostril in the afternoon/evenings.  She uses the azelastine 2 sprays twice a day.  She feels this regimen is working better for her however still reports nasal congestion.   She states it has been hard for her to remember to use her protonix and she was advised to take it 30 minutes prior to eating and she doesn't always know when she is going to eat to take it beforehand.   Review of systems: Review of Systems  Constitutional: Negative.   HENT: Positive for congestion.   Eyes: Negative.   Respiratory: Negative.   Cardiovascular: Negative.   Gastrointestinal: Negative.   Musculoskeletal: Negative.   Skin: Negative.   Neurological: Negative.     All other systems negative unless noted above in HPI  Past medical/social/surgical/family history have been reviewed and are unchanged unless specifically indicated below.  No changes  Medication List: Current Outpatient Medications  Medication Sig Dispense Refill  . aspirin EC 81 MG tablet Take 1 tablet (81 mg total) by mouth 2 (two) times daily. 84 tablet 0  . azelastine (ASTELIN) 0.1 % nasal spray Place 2 sprays into both nostrils 2  (two) times daily. Use in each nostril as directed    . Calcium Carb-Cholecalciferol (CALCIUM + D3 PO) Take 1 tablet by mouth daily after breakfast.    . cloNIDine (CATAPRES) 0.1 MG tablet Take 1 tablet (0.1 mg total) by mouth at bedtime. For hot flashes 30 tablet 6  . fluticasone (FLONASE) 50 MCG/ACT nasal spray Place 1 spray into both nostrils daily.    Marland Kitchen gabapentin (NEURONTIN) 300 MG capsule Take 1 capsule (300 mg total) by mouth at bedtime. 30 capsule 3  . hydrOXYzine (ATARAX/VISTARIL) 25 MG tablet Take 1 tablet (25 mg total) by mouth 3 (three) times daily as needed for itching. 60 tablet 1  . lamoTRIgine (LAMICTAL) 100 MG tablet TAKE 1 TABLET BY MOUTH EVERY MORNING & TAKE 2 TABLETS BY MOUTH EVERY EVENING 90 tablet 12  . Multiple Vitamin (MULTIVITAMIN WITH MINERALS) TABS tablet Take 1 tablet by mouth daily after breakfast. Centrum Women's 50+    . nicotine (NICODERM CQ) 21 mg/24hr patch Place 1 patch (21 mg total) onto the skin daily. 28 patch 1  . nicotine polacrilex (COMMIT) 4 MG lozenge Take 4 mg by mouth every 4 (four) hours as needed for smoking cessation.    . ondansetron (ZOFRAN) 4 MG tablet Take 1-2 tablets (4-8 mg total) by mouth every 8 (eight) hours as needed for nausea or vomiting. 40 tablet 0  . oxymetazoline (AFRIN) 0.05 % nasal spray Place 1 spray into both nostrils at bedtime as  needed for congestion.     . simvastatin (ZOCOR) 40 MG tablet Take 1 tablet (40 mg total) by mouth every evening. 30 tablet 6  . traZODone (DESYREL) 100 MG tablet Take 2 tablets (200 mg total) by mouth at bedtime as needed for sleep. 60 tablet 6  . triamcinolone (NASACORT) 55 MCG/ACT AERO nasal inhaler Place 2 sprays into the nose 2 (two) times daily as needed. 1 Inhaler 5  . Ciclesonide (ZETONNA) 37 MCG/ACT AERS Place 1 spray into the nose daily. (Patient not taking: Reported on 06/05/2019) 6.1 g 5  . HYDROcodone-acetaminophen (NORCO) 5-325 MG tablet Take 1-2 tablets by mouth 2 (two) times daily as needed  for moderate pain. (Patient not taking: Reported on 06/05/2019) 19 tablet 0  . senna-docusate (SENOKOT S) 8.6-50 MG tablet Take 1-2 tablets by mouth at bedtime as needed. (Patient not taking: Reported on 06/05/2019) 30 tablet 1  . traMADol (ULTRAM) 50 MG tablet Take 1 tablet (50 mg total) by mouth 2 (two) times daily as needed. (Patient not taking: Reported on 06/26/2019) 30 tablet 0   No current facility-administered medications for this visit.     Known medication allergies: Allergies  Allergen Reactions  . Haloperidol Decanoate Anaphylaxis  . Tape     Adhesive tape--itching     Physical examination: Blood pressure 122/76, pulse 90, temperature 98 F (36.7 C), temperature source Temporal, resp. rate 18, SpO2 96 %.  General: Alert, interactive, in no acute distress. HEENT: PERRLA, TMs pearly gray, turbinates moderately edematous without discharge (improved from previous exam), post-pharynx non erythematous. Neck: Supple without lymphadenopathy. Lungs: Clear to auscultation without wheezing, rhonchi or rales. {no increased work of breathing. CV: Normal S1, S2 without murmurs. Abdomen: Nondistended, nontender. Skin: Warm and dry, without lesions or rashes. Extremities:  No clubbing, cyanosis or edema. Neuro:   Grossly intact.  Diagnositics/Labs: None today  Assessment and plan:   Nasal congestion due to allergic rhinitis and medicamentosa  - improving from initial visit.  - smoking cessation can greatly improve congestion! Very happy she has quit smoking  - continue avoidance measures for weed pollens, tree pollens, molds and cat  - use Afrin 2 sprays each nostril only if you can not breathe out of both nostril only for 2-3 days maximum  - do the following regimen for the next week: Flonase 2 sprays each nostril once day and Nasacort 2 sprays each nostril once a day.   After a week decrease down to Flonase 1 spray each nostril once a day and Nasacort 1 spray each nostril once a  day.   will have you try Xhance nasal spray (fluticasone) 2 sprays each nostril twice a day.   - continue use of Azelastine 2 sprays each nostril twice a day for control of nasal drainage.    Reflux  - use of Protonix once a day (take around same time daily)   Follow-up 4-6 months or sooner if needed  I appreciate the opportunity to take part in [place] care. Please do not hesitate to contact me with questions.  Sincerely,   Prudy Feeler, MD Allergy/Immunology [facility]

## 2019-06-27 NOTE — Telephone Encounter (Signed)
States that she has vaginal lesions and needs to get them removed like she did before.

## 2019-06-27 NOTE — Telephone Encounter (Signed)
Patient called requesting a referral to the Gynecologist . Please f/u

## 2019-06-28 NOTE — Telephone Encounter (Signed)
Patient was called and informed of GYN referral.

## 2019-06-28 NOTE — Telephone Encounter (Signed)
Referral has been placed. 

## 2019-07-11 ENCOUNTER — Telehealth: Payer: Self-pay | Admitting: Adult Health

## 2019-07-11 NOTE — Telephone Encounter (Signed)
Pt called stating that the MD had informed her to not use artificial sugars but she has stopped smoking 2 months ago and has been using the Lozenges and noticed that it contains aspartame which she found out that is an artificial sweetener and she is wanting to know if she can still use the Lozenges for two more weeks or will that affect her. Please advise.

## 2019-07-15 ENCOUNTER — Other Ambulatory Visit: Payer: Self-pay

## 2019-07-15 ENCOUNTER — Ambulatory Visit (INDEPENDENT_AMBULATORY_CARE_PROVIDER_SITE_OTHER): Payer: Medicare Other

## 2019-07-15 ENCOUNTER — Encounter: Payer: Self-pay | Admitting: Podiatry

## 2019-07-15 ENCOUNTER — Telehealth: Payer: Self-pay

## 2019-07-15 ENCOUNTER — Ambulatory Visit (INDEPENDENT_AMBULATORY_CARE_PROVIDER_SITE_OTHER): Payer: Medicare Other | Admitting: Podiatry

## 2019-07-15 VITALS — Temp 98.0°F

## 2019-07-15 DIAGNOSIS — M2042 Other hammer toe(s) (acquired), left foot: Secondary | ICD-10-CM

## 2019-07-15 DIAGNOSIS — G4709 Other insomnia: Secondary | ICD-10-CM

## 2019-07-15 DIAGNOSIS — M2041 Other hammer toe(s) (acquired), right foot: Secondary | ICD-10-CM | POA: Diagnosis not present

## 2019-07-15 NOTE — Telephone Encounter (Signed)
Patient was called and informed of referral being placed. 

## 2019-07-15 NOTE — Telephone Encounter (Signed)
I called pt.  I was not sure this was meant for neuro.  She stated that Dr. Tish Men told her to not take artificial sweetners and she has been for the last 2.5 months due to quitting smoking.  (affect scar tissue and seizures).  She has 2 more weeks to go.  I relayed the over all benefit of quitting smoking outways the aspartame of 2-3 month use.  If any other recommendations to let her know.

## 2019-07-15 NOTE — Telephone Encounter (Signed)
Patient states that she is still having trouble sleeping and would like to increase her Trazodone or get another medication.

## 2019-07-15 NOTE — Telephone Encounter (Signed)
Noted  

## 2019-07-15 NOTE — Progress Notes (Signed)
   Subjective:    Patient ID: [name], female    DOB: [birth year], 69 y.o.   MRN: [id number]  HPI    Review of Systems  All other systems reviewed and are negative.      Objective:   Physical Exam        Assessment & Plan:

## 2019-07-15 NOTE — Patient Instructions (Signed)
Pre-Operative Instructions  Congratulations, you have decided to take an important step towards improving your quality of life.  You can be assured that the doctors and staff at [facility] will be with you every step of the way.  Here are some important things you should know:  1. Plan to be at the surgery center/hospital at least 1 (one) hour prior to your scheduled time, unless otherwise directed by the surgical center/hospital staff.  You must have a responsible adult accompany you, remain during the surgery and drive you home.  Make sure you have directions to the surgical center/hospital to ensure you arrive on time. 2. If you are having surgery at [facility] or [facility] hospitals, you will need a copy of your medical history and physical form from your family physician within one month prior to the date of surgery. We will give you a form for your primary physician to complete.  3. We make every effort to accommodate the date you request for surgery.  However, there are times where surgery dates or times have to be moved.  We will contact you as soon as possible if a change in schedule is required.   4. No aspirin/ibuprofen for one week before surgery.  If you are on aspirin, any non-steroidal anti-inflammatory medications (Mobic, Aleve, Ibuprofen) should not be taken seven (7) days prior to your surgery.  You make take Tylenol for pain prior to surgery.  5. Medications - If you are taking daily heart and blood pressure medications, seizure, reflux, allergy, asthma, anxiety, pain or diabetes medications, make sure you notify the surgery center/hospital before the day of surgery so they can tell you which medications you should take or avoid the day of surgery. 6. No food or drink after midnight the night before surgery unless directed otherwise by surgical center/hospital staff. 7. No alcoholic beverages 24-hours prior to surgery.  No smoking 24-hours prior or 24-hours after  surgery. 8. Wear loose pants or shorts. They should be loose enough to fit over bandages, boots, and casts. 9. Don't wear slip-on shoes. Sneakers are preferred. 10. Bring your boot with you to the surgery center/hospital.  Also bring crutches or a walker if your physician has prescribed it for you.  If you do not have this equipment, it will be provided for you after surgery. 11. If you have not been contacted by the surgery center/hospital by the day before your surgery, call to confirm the date and time of your surgery. 12. Leave-time from work may vary depending on the type of surgery you have.  Appropriate arrangements should be made prior to surgery with your employer. 13. Prescriptions will be provided immediately following surgery by your doctor.  Fill these as soon as possible after surgery and take the medication as directed. Pain medications will not be refilled on weekends and must be approved by the doctor. 14. Remove nail polish on the operative foot and avoid getting pedicures prior to surgery. 15. Wash the night before surgery.  The night before surgery wash the foot and leg well with water and the antibacterial soap provided. Be sure to pay special attention to beneath the toenails and in between the toes.  Wash for at least three (3) minutes. Rinse thoroughly with water and dry well with a towel.  Perform this wash unless told not to do so by your physician.  Enclosed: 1 Ice pack (please put in freezer the night before surgery)   1 Hibiclens skin cleaner     Pre-op instructions  If you have any questions regarding the instructions, please do not hesitate to call our office.  [place]: [address], [place], [place] [address] -- [phone]  [place]: [address] [place], [place] [address] -- [phone]  [place]: [address], [place], [place] [address] -- [phone]   Website: [email or web address] 

## 2019-07-15 NOTE — Telephone Encounter (Signed)
She is on maximum dose of Trazodone. I have referred her to a sleep specialist who will manage her insomnia going forward.

## 2019-07-15 NOTE — Telephone Encounter (Signed)
I relayed to pt to proceed with her quitting smoking. She verabalized understanding.

## 2019-07-17 ENCOUNTER — Encounter: Payer: Self-pay | Admitting: Obstetrics & Gynecology

## 2019-07-17 ENCOUNTER — Ambulatory Visit (INDEPENDENT_AMBULATORY_CARE_PROVIDER_SITE_OTHER): Payer: Medicare Other | Admitting: Obstetrics & Gynecology

## 2019-07-17 ENCOUNTER — Other Ambulatory Visit: Payer: Self-pay

## 2019-07-17 VITALS — BP 147/90 | HR 96 | Ht 66.0 in | Wt 161.2 lb

## 2019-07-17 DIAGNOSIS — N951 Menopausal and female climacteric states: Secondary | ICD-10-CM | POA: Diagnosis not present

## 2019-07-17 DIAGNOSIS — N9089 Other specified noninflammatory disorders of vulva and perineum: Secondary | ICD-10-CM

## 2019-07-17 MED ORDER — ESTRADIOL 0.5 MG PO TABS: 0.5000 mg | ORAL_TABLET | Freq: Every day | ORAL | 6 refills | Status: DC

## 2019-07-17 NOTE — Progress Notes (Signed)
Subjective:   Patient ID: [name], female   DOB: 69 y.o.   MRN: [id number]   HPI Patient presents stating she has had previous surgery on her toes approximately 20 years ago and this fifth toe continues to give her more problems and it is floppy and she catches it all the time and she is worried she is going to lose the toe.  Also has restless leg syndrome is on medication for that.  Patient does not smoke likes to be active   Review of Systems  All other systems reviewed and are negative.       Objective:  Physical Exam Vitals and nursing note reviewed.  Constitutional:      Appearance: She is well-developed.  Pulmonary:     Effort: Pulmonary effort is normal.  Musculoskeletal:        General: Normal range of motion.  Skin:    General: Skin is warm.  Neurological:     Mental Status: She is alert.     Neurovascular status intact muscle strength was found to be adequate range of motion within normal limits.  Patient has 5 digit right which has minimal bone content and has a rotated fourth digit left that is pressing against third toe creating irritation.  Patient's toe is very floppy in the fifth digit and it is at great risk for eventual trauma.  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  I have recommended a syndactylization procedure of the fifth digit to the fourth digit along with distal arthroplasty digit for left to try to prevent the pressure between the third and fourth toes.  She wants this done and I allowed her to read consent form going over line by line all complications that can occur with this type procedure and she is willing to  undergo risk willing to undergo surgery understanding again that this is revisional procedure and no guarantee what we will do and she will have her fourth and fifth digits will be partially syndactyl lysed.  After extensive review she signed consent form she understands total recovery can take up to 6 months  X-rays indicate that there is significant loss of bone content of the fifth digit left proximal phalanx with a fusion of the proximal phalanx digit 4 with distal contracture

## 2019-07-17 NOTE — Patient Instructions (Signed)
Atrophic Vaginitis  Atrophic vaginitis is a condition in which the tissues that line the vagina become dry and thin. This condition is most common in women who have stopped having regular menstrual periods (are in menopause). This usually starts when a woman is 45-67 years old. That is the time when a woman's estrogen levels begin to drop (decrease). Estrogen is a female hormone. It helps to keep the tissues of the vagina moist. It stimulates the vagina to produce a clear fluid that lubricates the vagina for sexual intercourse. This fluid also protects the vagina from infection. Lack of estrogen can cause the lining of the vagina to get thinner and dryer. The vagina may also shrink in size. It may become less elastic. Atrophic vaginitis tends to get worse over time as a woman's estrogen level drops. What are the causes? This condition is caused by the normal drop in estrogen that happens around the time of menopause. What increases the risk? Certain conditions or situations may lower a woman's estrogen level, leading to a higher risk for atrophic vaginitis. You are more likely to develop this condition if:  You are taking medicines that block estrogen.  You have had your ovaries removed.  You are being treated for cancer with X-ray (radiation) or medicines (chemotherapy).  You have given birth or are breastfeeding.  You are older than age 50.  You smoke. What are the signs or symptoms? Symptoms of this condition include:  Pain, soreness, or bleeding during sexual intercourse (dyspareunia).  Vaginal burning, irritation, or itching.  Pain or bleeding when a speculum is used in a vaginal exam (pelvic exam).  Having burning pain when passing urine.  Vaginal discharge that is brown or yellow. In some cases, there are no symptoms. How is this diagnosed? This condition is diagnosed by taking a medical history and doing a physical exam. This will include a pelvic exam that checks the  vaginal tissues. Though rare, you may also have other tests, including:  A urine test.  A test that checks the acid balance in your vagina (acid balance test). How is this treated? Treatment for this condition depends on how severe your symptoms are. Treatment may include:  Using an over-the-counter vaginal lubricant before sex.  Using a long-acting vaginal moisturizer.  Using low-dose vaginal estrogen for moderate to severe symptoms that do not respond to other treatments. Options include creams, tablets, and inserts (vaginal rings). Before you use a vaginal estrogen, tell your health care provider if you have a history of: ? Breast cancer. ? Endometrial cancer. ? Blood clots. If you are not sexually active and your symptoms are very mild, you may not need treatment. Follow these instructions at home: Medicines  Take over-the-counter and prescription medicines only as told by your health care provider. Do not use herbal or alternative medicines unless your health care provider says that you can.  Use over-the-counter creams, lubricants, or moisturizers for dryness only as directed by your health care provider. General instructions  If your atrophic vaginitis is caused by menopause, discuss all of your menopause symptoms and treatment options with your health care provider.  Do not douche.  Do not use products that can make your vagina dry. These include: ? Scented feminine sprays. ? Scented tampons. ? Scented soaps.  Vaginal intercourse can help to improve blood flow and elasticity of vaginal tissue. If it hurts to have sex, try using a lubricant or moisturizer just before having intercourse. Contact a health care provider if:    Your discharge looks different than normal.  Your vagina has an unusual smell.  You have new symptoms.  Your symptoms do not improve with treatment.  Your symptoms get worse. Summary  Atrophic vaginitis is a condition in which the tissues that  line the vagina become dry and thin. It is most common in women who have stopped having regular menstrual periods (are in menopause).  Treatment options include using vaginal lubricants and low-dose vaginal estrogen.  Contact a health care provider if your vagina has an unusual smell, or if your symptoms get worse or do not improve after treatment. This information is not intended to replace advice given to you by your health care provider. Make sure you discuss any questions you have with your health care provider. Document Revised: 03/17/2017 Document Reviewed: 12/29/2016 Elsevier Patient Education  2020 Elsevier Inc.  

## 2019-07-17 NOTE — Progress Notes (Signed)
Pt states she has "spots" on her vagina that have gotten worse. Pt states she has had this problem years ago and the areas were "cut out".  Pt also complains of hot flashes.
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
## 2019-07-30 ENCOUNTER — Telehealth: Payer: Self-pay

## 2019-07-30 NOTE — Telephone Encounter (Signed)
Returned call, no answer, left vm 

## 2019-08-02 ENCOUNTER — Telehealth: Payer: Self-pay

## 2019-08-02 DIAGNOSIS — L299 Pruritus, unspecified: Secondary | ICD-10-CM

## 2019-08-02 DIAGNOSIS — K219 Gastro-esophageal reflux disease without esophagitis: Secondary | ICD-10-CM | POA: Diagnosis not present

## 2019-08-02 MED ORDER — HYDROXYZINE HCL 25 MG PO TABS: 25.0000 mg | ORAL_TABLET | Freq: Three times a day (TID) | ORAL | 3 refills | Status: DC | PRN

## 2019-08-02 NOTE — Telephone Encounter (Signed)
Patient is requesting a refill on Hydroxyzine.

## 2019-08-02 NOTE — Telephone Encounter (Signed)
Done

## 2019-08-05 ENCOUNTER — Other Ambulatory Visit: Payer: Self-pay

## 2019-08-05 ENCOUNTER — Encounter: Payer: Self-pay | Admitting: Family Medicine

## 2019-08-05 ENCOUNTER — Ambulatory Visit: Payer: Medicare Other | Attending: Family Medicine | Admitting: Family Medicine

## 2019-08-05 VITALS — BP 128/78 | HR 80 | Ht 66.0 in | Wt 164.0 lb

## 2019-08-05 DIAGNOSIS — E782 Mixed hyperlipidemia: Secondary | ICD-10-CM | POA: Diagnosis not present

## 2019-08-05 DIAGNOSIS — M25512 Pain in left shoulder: Secondary | ICD-10-CM | POA: Diagnosis not present

## 2019-08-05 DIAGNOSIS — R635 Abnormal weight gain: Secondary | ICD-10-CM

## 2019-08-05 DIAGNOSIS — G2581 Restless legs syndrome: Secondary | ICD-10-CM | POA: Diagnosis not present

## 2019-08-05 MED ORDER — ROPINIROLE HCL 2 MG PO TABS: 2.0000 mg | ORAL_TABLET | Freq: Every day | ORAL | 3 refills | Status: DC

## 2019-08-05 MED ORDER — BACLOFEN 10 MG PO TABS: 10.0000 mg | ORAL_TABLET | Freq: Three times a day (TID) | ORAL | 2 refills | Status: DC

## 2019-08-05 NOTE — Patient Instructions (Signed)
Shoulder Pain Many things can cause shoulder pain, including:  An injury.  Moving the shoulder in the same way again and again (overuse).  Joint pain (arthritis). Pain can come from:  Swelling and irritation (inflammation) of any part of the shoulder.  An injury to the shoulder joint.  An injury to: ? Tissues that connect muscle to bone (tendons). ? Tissues that connect bones to each other (ligaments). ? Bones. Follow these instructions at home: Watch for changes in your symptoms. Let your doctor know about them. Follow these instructions to help with your pain. If you have a sling:  Wear the sling as told by your doctor. Remove it only as told by your doctor.  Loosen the sling if your fingers: ? Tingle. ? Become numb. ? Turn cold and blue.  Keep the sling clean.  If the sling is not waterproof: ? Do not let it get wet. ? Take the sling off when you shower or bathe. Managing pain, stiffness, and swelling   If told, put ice on the painful area: ? Put ice in a plastic bag. ? Place a towel between your skin and the bag. ? Leave the ice on for 20 minutes, 2-3 times a day. Stop putting ice on if it does not help with the pain.  Squeeze a soft ball or a foam pad as much as possible. This prevents swelling in the shoulder. It also helps to strengthen the arm. General instructions  Take over-the-counter and prescription medicines only as told by your doctor.  Keep all follow-up visits as told by your doctor. This is important. Contact a doctor if:  Your pain gets worse.  Medicine does not help your pain.  You have new pain in your arm, hand, or fingers. Get help right away if:  Your arm, hand, or fingers: ? Tingle. ? Are numb. ? Are swollen. ? Are painful. ? Turn white or blue. Summary  Shoulder pain can be caused by many things. These include injury, moving the shoulder in the same away again and again, and joint pain.  Watch for changes in your symptoms.  Let your doctor know about them.  This condition may be treated with a sling, ice, and pain medicine.  Contact your doctor if the pain gets worse or you have new pain. Get help right away if your arm, hand, or fingers tingle or get numb, swollen, or painful.  Keep all follow-up visits as told by your doctor. This is important. This information is not intended to replace advice given to you by your health care provider. Make sure you discuss any questions you have with your health care provider. Document Revised: 10/17/2017 Document Reviewed: 10/17/2017 Elsevier Patient Education  2020 [employer]vier Inc.  
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
## 2019-08-13 DIAGNOSIS — M205X2 Other deformities of toe(s) (acquired), left foot: Secondary | ICD-10-CM | POA: Diagnosis not present

## 2019-08-13 DIAGNOSIS — M2042 Other hammer toe(s) (acquired), left foot: Secondary | ICD-10-CM | POA: Diagnosis not present

## 2019-08-13 DIAGNOSIS — L570 Actinic keratosis: Secondary | ICD-10-CM | POA: Diagnosis not present

## 2019-08-13 DIAGNOSIS — E7800 Pure hypercholesterolemia, unspecified: Secondary | ICD-10-CM | POA: Diagnosis not present

## 2019-08-14 ENCOUNTER — Telehealth: Payer: Self-pay | Admitting: *Deleted

## 2019-08-14 NOTE — Telephone Encounter (Signed)
Pt states she had surgery yesterday with Dr. Paulla Dolly and her pain was managed until this morning, icing and wearing the boot, pain worsens when down to walk.

## 2019-08-14 NOTE — Telephone Encounter (Signed)
I spoke with pt and informed that it sounded like the ace wrap that was applied immediately after surgery was too tight. I told pt to sit down, remove the cam boot, open-ended sock, and ace wrap only, then elevate the foot for 15 minutes, but if pain worsened then dangle for 15 minutes this being the only time it was okay to dangle, after 15 minutes either way, place foot level with the hip and beginning at the toes gently roll the ace down the foot and up the leg, replace the sock and boot. Pt states she can not take ibuprofen.

## 2019-08-16 ENCOUNTER — Telehealth: Payer: Self-pay | Admitting: *Deleted

## 2019-08-16 MED ORDER — HYDROCODONE-ACETAMINOPHEN 10-325 MG PO TABS: 1.0000 | ORAL_TABLET | Freq: Three times a day (TID) | ORAL | 0 refills | Status: AC | PRN

## 2019-08-16 NOTE — Telephone Encounter (Signed)
Taken care of

## 2019-08-16 NOTE — Addendum Note (Signed)
Addended by: Wallene Huh on: 08/16/2019 11:26 AM   Modules accepted: Orders

## 2019-08-16 NOTE — Telephone Encounter (Signed)
Pt states she is still having pain in her foot and is wanting a refill of the narcotic pain medication.

## 2019-08-21 ENCOUNTER — Ambulatory Visit (INDEPENDENT_AMBULATORY_CARE_PROVIDER_SITE_OTHER): Payer: Medicare Other

## 2019-08-21 ENCOUNTER — Ambulatory Visit (INDEPENDENT_AMBULATORY_CARE_PROVIDER_SITE_OTHER): Payer: Medicare Other | Admitting: Podiatry

## 2019-08-21 ENCOUNTER — Other Ambulatory Visit: Payer: Self-pay

## 2019-08-21 DIAGNOSIS — M2041 Other hammer toe(s) (acquired), right foot: Secondary | ICD-10-CM

## 2019-08-21 DIAGNOSIS — M2042 Other hammer toe(s) (acquired), left foot: Secondary | ICD-10-CM

## 2019-08-21 MED ORDER — HYDROCODONE-ACETAMINOPHEN 10-325 MG PO TABS: 1.0000 | ORAL_TABLET | Freq: Three times a day (TID) | ORAL | 0 refills | Status: AC | PRN

## 2019-08-21 NOTE — Progress Notes (Signed)
Subjective:   Patient ID: [name], female   DOB: 69 y.o.   MRN: [id number]   HPI Patient is doing very well with surgery left foot and pleased so far but does need pain medicine for nighttime but she gets quite a bit of throbbing at night   ROS      Objective:  Physical Exam  Neurovascular status intact negative Bevelyn Buckles' sign noted wound edges well coapted fourth and fifth digits good alignment with good positional component     Assessment:  Doing well post foot surgery left     Plan:  H&P conditions reviewed sterile dressing reapplied advised on continued elevation compression immobilization and reappoint 2 weeks suture removal or earlier if needed  X-rays indicate that the satisfactory resection of bone fourth digit is occurred with good alignment noted

## 2019-08-23 ENCOUNTER — Ambulatory Visit: Payer: Medicare Other | Attending: Family Medicine

## 2019-08-23 ENCOUNTER — Other Ambulatory Visit: Payer: Self-pay

## 2019-08-23 DIAGNOSIS — E782 Mixed hyperlipidemia: Secondary | ICD-10-CM

## 2019-08-24 LAB — CMP14+EGFR
ALT: 16 [IU]/L (ref 0–32)
AST: 19 [IU]/L (ref 0–40)
Albumin/Globulin Ratio: 2 (ref 1.2–2.2)
Albumin: 4.6 g/dL (ref 3.8–4.8)
Alkaline Phosphatase: 130 [IU]/L — ABNORMAL HIGH (ref 39–117)
BUN/Creatinine Ratio: 22 (ref 12–28)
BUN: 24 mg/dL (ref 8–27)
Bilirubin Total: <0.2 mg/dL (ref 0.0–1.2)
CO2: 21 mmol/L (ref 20–29)
Calcium: 9.4 mg/dL (ref 8.7–10.3)
Chloride: 103 mmol/L (ref 96–106)
Creatinine, Ser: 1.11 mg/dL — ABNORMAL HIGH (ref 0.57–1.00)
GFR calc Af Amer: 60 mL/min/{1.73_m2}
GFR calc non Af Amer: 52 mL/min/{1.73_m2} — ABNORMAL LOW
Globulin, Total: 2.3 g/dL (ref 1.5–4.5)
Glucose: 99 mg/dL (ref 65–99)
Potassium: 5.2 mmol/L (ref 3.5–5.2)
Sodium: 140 mmol/L (ref 134–144)
Total Protein: 6.9 g/dL (ref 6.0–8.5)

## 2019-08-24 LAB — LIPID PANEL
Chol/HDL Ratio: 3.4 ratio (ref 0.0–4.4)
Cholesterol, Total: 182 mg/dL (ref 100–199)
HDL: 53 mg/dL
LDL Chol Calc (NIH): 102 mg/dL — ABNORMAL HIGH (ref 0–99)
Triglycerides: 156 mg/dL — ABNORMAL HIGH (ref 0–149)
VLDL Cholesterol Cal: 27 mg/dL (ref 5–40)

## 2019-08-26 DIAGNOSIS — Z961 Presence of intraocular lens: Secondary | ICD-10-CM | POA: Diagnosis not present

## 2019-08-26 DIAGNOSIS — H0102B Squamous blepharitis left eye, upper and lower eyelids: Secondary | ICD-10-CM | POA: Diagnosis not present

## 2019-08-26 DIAGNOSIS — H538 Other visual disturbances: Secondary | ICD-10-CM | POA: Diagnosis not present

## 2019-08-26 DIAGNOSIS — H0102A Squamous blepharitis right eye, upper and lower eyelids: Secondary | ICD-10-CM | POA: Diagnosis not present

## 2019-08-26 DIAGNOSIS — H16223 Keratoconjunctivitis sicca, not specified as Sjogren's, bilateral: Secondary | ICD-10-CM | POA: Diagnosis not present

## 2019-08-26 DIAGNOSIS — H26493 Other secondary cataract, bilateral: Secondary | ICD-10-CM | POA: Diagnosis not present

## 2019-08-29 ENCOUNTER — Encounter: Payer: Self-pay | Admitting: Internal Medicine

## 2019-08-29 ENCOUNTER — Telehealth: Payer: Self-pay | Admitting: Internal Medicine

## 2019-08-29 ENCOUNTER — Ambulatory Visit (INDEPENDENT_AMBULATORY_CARE_PROVIDER_SITE_OTHER): Payer: Medicare Other | Admitting: Internal Medicine

## 2019-08-29 ENCOUNTER — Other Ambulatory Visit: Payer: Self-pay

## 2019-08-29 VITALS — BP 102/60 | HR 74 | Temp 96.5°F | Ht 66.0 in | Wt 160.0 lb

## 2019-08-29 DIAGNOSIS — F5101 Primary insomnia: Secondary | ICD-10-CM | POA: Diagnosis not present

## 2019-08-29 DIAGNOSIS — F519 Sleep disorder not due to a substance or known physiological condition, unspecified: Secondary | ICD-10-CM

## 2019-08-29 DIAGNOSIS — G4733 Obstructive sleep apnea (adult) (pediatric): Secondary | ICD-10-CM | POA: Diagnosis not present

## 2019-08-29 NOTE — Patient Instructions (Signed)
Order- schedule HST    Dx OSA with Insomnia  Please call us about 2 weeks after your sleep study to see if results and recommendations are ready yet. If appropriate, we may be able to start treatment before we see you next.
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
## 2019-08-29 NOTE — Telephone Encounter (Signed)
Pt came by office thinking that an Rx was supposed to be sent to pharmacy for her. Looked at today's OV and saw where HST was ordered but no Rx.  Dr. Annamaria Boots, please advise on this.

## 2019-08-30 ENCOUNTER — Telehealth: Payer: Self-pay | Admitting: *Deleted

## 2019-08-30 ENCOUNTER — Telehealth: Payer: Self-pay

## 2019-08-30 ENCOUNTER — Encounter: Payer: Self-pay | Admitting: Internal Medicine

## 2019-08-30 NOTE — Telephone Encounter (Signed)
-----   Message from Charlott Rakes, MD sent at 08/27/2019 11:13 PM EDT ----- Cholesterol is normal, other labs are stable

## 2019-08-30 NOTE — Telephone Encounter (Signed)
She has been getting trazodone from PCP. I told her we could consider alternatives After she has sleep study done. I don't want to make it harder for her to wake up from apnea if she needs to breathe.

## 2019-08-30 NOTE — Telephone Encounter (Signed)
Called and spoke with pt letting her know the info stated by Dr. Annamaria Boots and she verbalized understanding. While speaking with pt, she gave me the dates she received her covid vaccine so I have updated that. Nothing further needed.

## 2019-08-30 NOTE — Assessment & Plan Note (Signed)
History of chronic insomnia. Unclear if there has been sleep apnea. Past hx drug and ETOH use now said to be stopped. Sometimes these represent self-medication for insomnia, but can cause sleep pattern disruption. She is not responding well to trazodone. Possibly a candidate for Belsomra, but need to exclude sleep disordered breathing first. Plan- sleep study, then see what help we can give for insomnia complaint.

## 2019-08-30 NOTE — Telephone Encounter (Signed)
1 year Ortho bundle call for right hip; 6 month call for left hip.

## 2019-08-30 NOTE — Telephone Encounter (Signed)
Patient called in and requested for results. Patient was identified by 2 patient identifiers. Results were given. Patient verbalized understating and had no further questions or concerns at this time.

## 2019-08-30 NOTE — Telephone Encounter (Signed)
Error routing it to [employer]. Meant to route it to Dr. Annamaria Boots. Dr. Annamaria Boots, please advise.

## 2019-08-30 NOTE — Telephone Encounter (Signed)
Pt calling back back prescription that was supposed to be called in . Pt ca be reached at [phone].

## 2019-08-30 NOTE — Telephone Encounter (Signed)
Patient was called and a voicemail was left informing patient to return phone call for lab results. 

## 2019-09-03 DIAGNOSIS — N3010 Interstitial cystitis (chronic) without hematuria: Secondary | ICD-10-CM | POA: Diagnosis not present

## 2019-09-04 ENCOUNTER — Ambulatory Visit (INDEPENDENT_AMBULATORY_CARE_PROVIDER_SITE_OTHER): Payer: Medicare Other | Admitting: Podiatry

## 2019-09-04 ENCOUNTER — Ambulatory Visit (INDEPENDENT_AMBULATORY_CARE_PROVIDER_SITE_OTHER): Payer: Medicare Other

## 2019-09-04 ENCOUNTER — Other Ambulatory Visit: Payer: Self-pay

## 2019-09-04 ENCOUNTER — Encounter: Payer: Self-pay | Admitting: Podiatry

## 2019-09-04 ENCOUNTER — Other Ambulatory Visit: Payer: Medicare Other

## 2019-09-04 VITALS — Temp 97.9°F

## 2019-09-04 DIAGNOSIS — M2042 Other hammer toe(s) (acquired), left foot: Secondary | ICD-10-CM

## 2019-09-05 NOTE — Progress Notes (Signed)
Subjective:   Patient ID: [name], female   DOB: 69 y.o.   MRN: [id number]   HPI Patient presents stating doing very well with digit [place] stitches out and very pleased with the procedure that we have done   ROS      Objective:  Physical Exam  Neurovascular status intact with patient's left fourth interspace healing well wound edges well coapted stitches intact along with distal fourth toe     Assessment:  Doing well post arthroplasty partial syndactylization     Plan:  H&P x-rays reviewed stitches removed wound edges well coapted crusted with no breakdown of tissue instructed on continued soaks open toed shoes for the next couple weeks and if any redness drainage swelling were to occur to let us know immediately  X-rays indicate satisfactory resection of bone distal fourth toe left with good alignment of the toe

## 2019-09-10 ENCOUNTER — Other Ambulatory Visit: Payer: Self-pay

## 2019-09-10 ENCOUNTER — Ambulatory Visit (INDEPENDENT_AMBULATORY_CARE_PROVIDER_SITE_OTHER): Payer: Medicare Other | Admitting: Orthopaedic Surgery

## 2019-09-10 ENCOUNTER — Encounter: Payer: Self-pay | Admitting: Orthopaedic Surgery

## 2019-09-10 ENCOUNTER — Ambulatory Visit (INDEPENDENT_AMBULATORY_CARE_PROVIDER_SITE_OTHER): Payer: Medicare Other

## 2019-09-10 DIAGNOSIS — Z96642 Presence of left artificial hip joint: Secondary | ICD-10-CM | POA: Diagnosis not present
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
## 2019-09-11 ENCOUNTER — Encounter: Payer: Medicare Other | Admitting: Podiatry

## 2019-09-12 ENCOUNTER — Other Ambulatory Visit: Payer: Self-pay | Admitting: Family Medicine

## 2019-09-12 DIAGNOSIS — Z1231 Encounter for screening mammogram for malignant neoplasm of breast: Secondary | ICD-10-CM

## 2019-09-17 DIAGNOSIS — G4730 Sleep apnea, unspecified: Secondary | ICD-10-CM

## 2019-09-17 HISTORY — DX: Sleep apnea, unspecified: G47.30

## 2019-09-18 ENCOUNTER — Other Ambulatory Visit: Payer: Self-pay

## 2019-09-18 ENCOUNTER — Ambulatory Visit: Payer: Medicare Other

## 2019-09-18 DIAGNOSIS — G4733 Obstructive sleep apnea (adult) (pediatric): Secondary | ICD-10-CM

## 2019-09-18 DIAGNOSIS — F5101 Primary insomnia: Secondary | ICD-10-CM

## 2019-09-23 DIAGNOSIS — G4733 Obstructive sleep apnea (adult) (pediatric): Secondary | ICD-10-CM

## 2019-10-03 ENCOUNTER — Telehealth: Payer: Self-pay | Admitting: Internal Medicine

## 2019-10-03 DIAGNOSIS — G4733 Obstructive sleep apnea (adult) (pediatric): Secondary | ICD-10-CM

## 2019-10-03 NOTE — Telephone Encounter (Signed)
Home sleep test shows obstructive sleep apnea, averaging 11 apneas/ hour, with low blood oxygen levels. We need to get this under control to protect her breathing, by using CPAP. Then we can consider ways to help with insomnia.  Recommend- order new DME, new CPAP auto 5-15, mask of choice, humidifier, supplies, AirView/ card.  Please make sure she has a return ov appointment in 31-90 days, per insurance requirements.

## 2019-10-03 NOTE — Telephone Encounter (Signed)
Called and spoke with pt. Pt is requesting the results of HST. Dr. Annamaria Boots, please advise.

## 2019-10-04 ENCOUNTER — Telehealth: Payer: Self-pay | Admitting: Internal Medicine

## 2019-10-04 DIAGNOSIS — G4733 Obstructive sleep apnea (adult) (pediatric): Secondary | ICD-10-CM

## 2019-10-04 NOTE — Telephone Encounter (Signed)
Called and spoke with pt letting her know the results of the HST and stated to her that CY stated recommendation is to start her on cpap. Pt verbalized understanding. Order has been placed for cpap start. Pt already has a f/u appt scheduled with CY in August 2021. Nothing further needed.

## 2019-10-04 NOTE — Telephone Encounter (Signed)
Called and spoke with pt letting her know that I have placed order specifying nasal mask and she verbalized understanding. Nothing further needed.

## 2019-10-23 DIAGNOSIS — H538 Other visual disturbances: Secondary | ICD-10-CM | POA: Diagnosis not present

## 2019-10-23 DIAGNOSIS — H16221 Keratoconjunctivitis sicca, not specified as Sjogren's, right eye: Secondary | ICD-10-CM | POA: Diagnosis not present

## 2019-10-28 ENCOUNTER — Other Ambulatory Visit: Payer: Self-pay | Admitting: Family Medicine

## 2019-10-28 DIAGNOSIS — E782 Mixed hyperlipidemia: Secondary | ICD-10-CM

## 2019-11-05 ENCOUNTER — Telehealth: Payer: Self-pay | Admitting: Orthopaedic Surgery

## 2019-11-05 NOTE — Telephone Encounter (Signed)
Spoke with patient she advised she will call back this afternoon to reschedule her appointment

## 2019-11-06 ENCOUNTER — Telehealth: Payer: Self-pay | Admitting: Family Medicine

## 2019-11-06 ENCOUNTER — Encounter: Payer: Self-pay | Admitting: Family Medicine

## 2019-11-06 ENCOUNTER — Other Ambulatory Visit: Payer: Self-pay

## 2019-11-06 ENCOUNTER — Ambulatory Visit: Payer: Medicare Other | Attending: Family Medicine | Admitting: Family Medicine

## 2019-11-06 DIAGNOSIS — N951 Menopausal and female climacteric states: Secondary | ICD-10-CM

## 2019-11-06 DIAGNOSIS — G4709 Other insomnia: Secondary | ICD-10-CM

## 2019-11-06 DIAGNOSIS — K5909 Other constipation: Secondary | ICD-10-CM | POA: Diagnosis not present

## 2019-11-06 DIAGNOSIS — G2581 Restless legs syndrome: Secondary | ICD-10-CM

## 2019-11-06 MED ORDER — CLONIDINE HCL 0.2 MG PO TABS: 0.2000 mg | ORAL_TABLET | Freq: Every day | ORAL | 1 refills | Status: DC

## 2019-11-06 MED ORDER — LINACLOTIDE 72 MCG PO CAPS: 72.0000 ug | ORAL_CAPSULE | Freq: Every day | ORAL | 1 refills | Status: DC

## 2019-11-06 MED ORDER — GABAPENTIN 300 MG PO CAPS: ORAL_CAPSULE | ORAL | 1 refills | Status: DC
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
## 2019-11-06 NOTE — Telephone Encounter (Signed)
PT was just in to see Dr Delane Ginger this am wanted nurse to know she had called Dr Lendell Caprice office and he will treat her restless legs but as he has not treated her for that specific before states that he needs a referral. Send referral to Dr [facility] office [place] # is [phone]

## 2019-11-06 NOTE — Progress Notes (Signed)
Patient states that her medication for restless leg syndrome is not working.

## 2019-11-11 ENCOUNTER — Telehealth: Payer: Self-pay | Admitting: Internal Medicine

## 2019-11-11 MED ORDER — CLONAZEPAM 0.5 MG PO TABS: ORAL_TABLET | ORAL | 1 refills | Status: DC

## 2019-11-11 NOTE — Telephone Encounter (Signed)
Spoke with [name]. She reports she is still having trouble sleeping and adjusting to the CPAP that she started using 2 weeks ago. [name] states she went back to [place] last week to get some adjustments and she still cannot sleep. She is still taking Trazadone 200mg  at bedtime without relief.  She has f/u appt with Dr Annamaria Boots on 12/09/19 but is asking if he can prescribe something different to help her fall asleep. Please advise.  Current Outpatient Medications on File Prior to Visit  Medication Sig Dispense Refill  . aspirin EC 81 MG tablet Take 1 tablet (81 mg total) by mouth 2 (two) times daily. 84 tablet 0  . baclofen (LIORESAL) 10 MG tablet Take 1 tablet (10 mg total) by mouth 3 (three) times daily. 60 each 2  . Calcium Carb-Cholecalciferol (CALCIUM + D3 PO) Take 1 tablet by mouth daily after breakfast.    . chlorhexidine (PERIDEX) 0.12 % solution     . cloNIDine (CATAPRES) 0.2 MG tablet Take 1 tablet (0.2 mg total) by mouth at bedtime. For hot flashes 90 tablet 1  . gabapentin (NEURONTIN) 300 MG capsule TAKE 1 CAPSULE(300 MG) BY MOUTH AT BEDTIME 90 capsule 1  . hydrOXYzine (ATARAX/VISTARIL) 25 MG tablet Take 1 tablet (25 mg total) by mouth 3 (three) times daily as needed for itching. 60 tablet 3  . ibuprofen (ADVIL) 800 MG tablet     . lamoTRIgine (LAMICTAL) 100 MG tablet TAKE 1 TABLET BY MOUTH EVERY MORNING & TAKE 2 TABLETS BY MOUTH EVERY EVENING 90 tablet 12  . linaclotide (LINZESS) 72 MCG capsule Take 1 capsule (72 mcg total) by mouth daily before breakfast. 90 capsule 1  . Multiple Vitamin (MULTIVITAMIN WITH MINERALS) TABS tablet Take 1 tablet by mouth daily after breakfast. Centrum Women's 50+    . nicotine polacrilex (COMMIT) 4 MG lozenge Take 4 mg by mouth every 4 (four) hours as needed for smoking cessation.    Marland Kitchen rOPINIRole (REQUIP) 2 MG tablet Take 1 tablet (2 mg total) by mouth at bedtime. For Restless leg syndrome 30 tablet 3  . simvastatin (ZOCOR) 40 MG tablet TAKE 1 TABLET BY MOUTH EVERY DAY  IN THE EVENING 120 tablet 0  . traMADol (ULTRAM) 50 MG tablet Take by mouth.    . traZODone (DESYREL) 100 MG tablet Take 2 tablets (200 mg total) by mouth at bedtime as needed for sleep. 60 tablet 6  . tretinoin (RETIN-A) 0.05 % cream APPLY THIN COAT TO FACE AND CHEST EVERY NIGHT AT BEDTIME    . triamcinolone (NASACORT) 55 MCG/ACT AERO nasal inhaler Place 2 sprays into the nose 2 (two) times daily as needed. 1 Inhaler 5  . XIIDRA 5 % SOLN Instill 1 drop into both eyes twice a day     No current facility-administered medications on file prior to visit.

## 2019-11-11 NOTE — Telephone Encounter (Signed)
Pt calling back regarding previous msg. Please [phone]

## 2019-11-11 NOTE — Telephone Encounter (Signed)
Spoke with the pt and notified of recs per CDY  She verbalized understanding   

## 2019-11-11 NOTE — Telephone Encounter (Signed)
DCing Trazodone. I have sent a limited supply of clonazepam to her drug store, to see if it helps. Try taking about 30 minutes before bedtime.

## 2019-11-12 ENCOUNTER — Telehealth: Payer: Self-pay | Admitting: Internal Medicine

## 2019-11-12 NOTE — Telephone Encounter (Signed)
CY discontinued pt's trazodone and prescribed clonazepam to see if this would work better for her for sleep.  Pt was notified of this 11/11/19.  Attempted to call pt to further discuss this but unable to reach. Left message for her to return call.

## 2019-11-12 NOTE — Telephone Encounter (Signed)
pt states that [facility] on [address] - needs to know why Dr. Annamaria Boots is RX the clonazePAM (KLONOPIN) 0.5 MG tablet  - she is stating that she is wanting her money back as she had to pay cash due to the fact that they didn't know why Dr. Annamaria Boots had FX'G it for the pt - please advise [phone]

## 2019-11-13 DIAGNOSIS — H0102A Squamous blepharitis right eye, upper and lower eyelids: Secondary | ICD-10-CM | POA: Diagnosis not present

## 2019-11-13 DIAGNOSIS — H0102B Squamous blepharitis left eye, upper and lower eyelids: Secondary | ICD-10-CM | POA: Diagnosis not present

## 2019-11-13 DIAGNOSIS — H538 Other visual disturbances: Secondary | ICD-10-CM | POA: Diagnosis not present

## 2019-11-13 DIAGNOSIS — H16142 Punctate keratitis, left eye: Secondary | ICD-10-CM | POA: Diagnosis not present

## 2019-11-13 DIAGNOSIS — H16223 Keratoconjunctivitis sicca, not specified as Sjogren's, bilateral: Secondary | ICD-10-CM | POA: Diagnosis not present

## 2019-11-13 DIAGNOSIS — H2000 Unspecified acute and subacute iridocyclitis: Secondary | ICD-10-CM | POA: Diagnosis not present

## 2019-11-13 DIAGNOSIS — Z961 Presence of intraocular lens: Secondary | ICD-10-CM | POA: Diagnosis not present

## 2019-11-13 DIAGNOSIS — H26493 Other secondary cataract, bilateral: Secondary | ICD-10-CM | POA: Diagnosis not present

## 2019-11-13 NOTE — Telephone Encounter (Signed)
Attempted to call pt but unable to reach. Left message for her to return call. 

## 2019-11-13 NOTE — Telephone Encounter (Signed)
Pt called stating she has trouble using CPAP due to issues with dry eye and recommendations from eye dr.  Quintella Baton getting used to using machine w/prescription Dr. Annamaria Boots prescribed.  Please advise.  [phone]

## 2019-11-13 NOTE — Telephone Encounter (Signed)
Pt returning call.  [phone]

## 2019-11-14 DIAGNOSIS — H2000 Unspecified acute and subacute iridocyclitis: Secondary | ICD-10-CM | POA: Diagnosis not present

## 2019-11-14 DIAGNOSIS — H538 Other visual disturbances: Secondary | ICD-10-CM | POA: Diagnosis not present

## 2019-11-14 DIAGNOSIS — H16223 Keratoconjunctivitis sicca, not specified as Sjogren's, bilateral: Secondary | ICD-10-CM | POA: Diagnosis not present

## 2019-11-14 DIAGNOSIS — H16142 Punctate keratitis, left eye: Secondary | ICD-10-CM | POA: Diagnosis not present

## 2019-11-14 DIAGNOSIS — Z961 Presence of intraocular lens: Secondary | ICD-10-CM | POA: Diagnosis not present

## 2019-11-16 NOTE — Telephone Encounter (Signed)
She is calling for some paperwork to  Be filled out for clonazepam ( ? Prior authorization)so she can get $25 back from the pharmacy CPAP is drying her eys out. I note that OSA is mild , AHI 11/h Perhaps not a good candidate for CPAP & try alternative or no therapy

## 2019-11-18 ENCOUNTER — Other Ambulatory Visit: Payer: Self-pay | Admitting: Family Medicine

## 2019-11-18 ENCOUNTER — Telehealth: Payer: Self-pay | Admitting: Internal Medicine

## 2019-11-18 DIAGNOSIS — G4709 Other insomnia: Secondary | ICD-10-CM

## 2019-11-18 DIAGNOSIS — G2581 Restless legs syndrome: Secondary | ICD-10-CM

## 2019-11-18 NOTE — Telephone Encounter (Signed)
Pt called to complain that she is unable to sleep with nasal pillow mask and can not tolerate full face mask. Pt states she feels like she is smothering and is requesting something "stronger" for sleep. Pt stated klonopin was not effective because it made her go to the bathroom to much.  Dr. Annamaria Boots please advise.

## 2019-11-18 NOTE — Telephone Encounter (Signed)
Maybe one of the other sleep docs could see here for a second opinion while I am away this week.

## 2019-11-18 NOTE — Telephone Encounter (Signed)
Patient is wanting referral to [facility] (Dr. Andrey Spearman) for her restless leg syndrome.

## 2019-11-18 NOTE — Telephone Encounter (Signed)
Pt is calling checking on the referral to dr [facility] office. Pt has medicare/medicaid insurance. Pt need referral for restless legs syndrome.  Pt would like to be seen soon as possible in the afternoon

## 2019-11-18 NOTE — Telephone Encounter (Signed)
None of our other sleep doctors have any openings. Pt's appt with you is the earliest appt avail.  Dr. Annamaria Boots, please advise if you would be okay with pt not using her cpap until she gets the dental device or please advise what you recommend.

## 2019-11-18 NOTE — Telephone Encounter (Signed)
I already placed referral on 11/06/19 after her visit with me and she did not mention a specific neurologist. Please coordinate with Alinda Sierras ... I'm not sure which Neurologist she sent referral to.

## 2019-11-19 NOTE — Telephone Encounter (Signed)
Can you look into this neurology referral please.

## 2019-11-19 NOTE — Telephone Encounter (Signed)
Pt called back about this: [employer] called pt and told her she should not use Cpap if she breathes through her mouth-- and pt does breathe through her mouth. Pt doesn't know what to do. Pt wanted to inform us of that.   Pt also reports klonopin doesn't help. She feels she is smothering with the mask on her face.

## 2019-11-19 NOTE — Telephone Encounter (Signed)
Gm  I don't see a referral placed  per Dr Margarita Rana  but patient has an appointment schedule  01-07-20 @ 3pm  with  Amy lomax  she works in the same location of Dr Andrey Spearman [facility]

## 2019-11-19 NOTE — Telephone Encounter (Signed)
Patient reporting she can not wear her CPAP mask, not the full face mask or the nasal mask. She wears a night guard and reports she is a mouth breather. She is overwhelmed with medical problems at this time. Patient and nurse had long discussion about what she needed. The plan is to wait and discuss issues at the next follow up appointment with Dr. Annamaria Boots and to continue to use the klonopin as directed. She will call us if she has any further problems.

## 2019-11-19 NOTE — Telephone Encounter (Signed)
Patient was called and informed of upcoming appointment.

## 2019-11-21 NOTE — Telephone Encounter (Signed)
Spoke with patient, she is having a hard time tolerating the CPAP.   She spoke with Olivia Mackie with [facility] and she is going to go into the office next week to see about some other type of mask.  She went to her eye doctor about her dry eyes, they turned red and became painful.  She has been referred to a specialist at [facility], states it is not looking good.  She is concerned about losing her vision.  She will let us know if she needs anything further regarding her CPAP.  Nothing further needed at this time.

## 2019-11-22 DIAGNOSIS — H3022 Posterior cyclitis, left eye: Secondary | ICD-10-CM | POA: Diagnosis not present

## 2019-11-22 DIAGNOSIS — H209 Unspecified iridocyclitis: Secondary | ICD-10-CM | POA: Diagnosis not present

## 2019-11-22 DIAGNOSIS — H04123 Dry eye syndrome of bilateral lacrimal glands: Secondary | ICD-10-CM | POA: Diagnosis not present

## 2019-11-27 ENCOUNTER — Telehealth: Payer: Self-pay | Admitting: Family Medicine

## 2019-11-27 ENCOUNTER — Ambulatory Visit: Payer: Medicare Other | Admitting: Allergy

## 2019-11-27 NOTE — Telephone Encounter (Signed)
Returned call to patient and scheduled an appt for 12/18/19 with PCP.   Copied from [place] [id number]. Topic: Appointment Scheduling - Scheduling Inquiry for Clinic >> Nov 27, 2019 10:17 AM Percell Belt A wrote: Reason for CRM: pt stated she is going though a lot of health issues and would like to see Dr Margarita Rana as soon as possible.  She would like her to know that she has started smoking again do to her stress and would like to stop and get some patches.  She also needs to fu with her about the results from the eye dr .  She does not want to wait til oct she would like an appt as soon as possible  Best number -[phone]

## 2019-11-27 NOTE — Telephone Encounter (Signed)
Noted  

## 2019-12-02 DIAGNOSIS — H209 Unspecified iridocyclitis: Secondary | ICD-10-CM | POA: Diagnosis not present

## 2019-12-02 DIAGNOSIS — H3022 Posterior cyclitis, left eye: Secondary | ICD-10-CM | POA: Diagnosis not present

## 2019-12-02 DIAGNOSIS — H44112 Panuveitis, left eye: Secondary | ICD-10-CM | POA: Diagnosis not present

## 2019-12-02 DIAGNOSIS — Z79899 Other long term (current) drug therapy: Secondary | ICD-10-CM | POA: Diagnosis not present

## 2019-12-02 DIAGNOSIS — Z7952 Long term (current) use of systemic steroids: Secondary | ICD-10-CM | POA: Diagnosis not present

## 2019-12-09 ENCOUNTER — Encounter: Payer: Self-pay | Admitting: Internal Medicine

## 2019-12-09 ENCOUNTER — Telehealth: Payer: Self-pay | Admitting: Internal Medicine

## 2019-12-09 ENCOUNTER — Ambulatory Visit (INDEPENDENT_AMBULATORY_CARE_PROVIDER_SITE_OTHER): Payer: Medicare Other | Admitting: Internal Medicine

## 2019-12-09 ENCOUNTER — Ambulatory Visit (INDEPENDENT_AMBULATORY_CARE_PROVIDER_SITE_OTHER): Payer: Medicare Other

## 2019-12-09 ENCOUNTER — Other Ambulatory Visit: Payer: Self-pay

## 2019-12-09 VITALS — BP 132/82 | HR 84 | Temp 97.3°F | Ht 66.5 in | Wt 158.2 lb

## 2019-12-09 DIAGNOSIS — F17200 Nicotine dependence, unspecified, uncomplicated: Secondary | ICD-10-CM | POA: Diagnosis not present

## 2019-12-09 DIAGNOSIS — J449 Chronic obstructive pulmonary disease, unspecified: Secondary | ICD-10-CM

## 2019-12-09 DIAGNOSIS — G4733 Obstructive sleep apnea (adult) (pediatric): Secondary | ICD-10-CM | POA: Diagnosis not present

## 2019-12-09 DIAGNOSIS — F519 Sleep disorder not due to a substance or known physiological condition, unspecified: Secondary | ICD-10-CM

## 2019-12-09 DIAGNOSIS — H209 Unspecified iridocyclitis: Secondary | ICD-10-CM | POA: Diagnosis not present

## 2019-12-09 MED ORDER — CLONAZEPAM 0.5 MG PO TABS: ORAL_TABLET | ORAL | 5 refills | Status: DC
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
## 2019-12-09 NOTE — Patient Instructions (Signed)
Order- CXR dx COPD mixed type  Order- schedule PFT   Dx COPD mixed type  I'm proud of you for trying to quit smoking- keep at it !  Refill script sent for clonazepam for sleep   Ask Dr Graciella Freer if he makes oral appliances to treat mild obstructive sleep apnea. Your AHI score on your sleep study was 11.2/ hour.

## 2019-12-09 NOTE — Telephone Encounter (Signed)
Called and spoke with patient, she stated she spoke with the pharmacy and it would be ready in 30 minutes.  Nothing further needed.

## 2019-12-16 DIAGNOSIS — Z79899 Other long term (current) drug therapy: Secondary | ICD-10-CM | POA: Diagnosis not present

## 2019-12-16 DIAGNOSIS — Z7952 Long term (current) use of systemic steroids: Secondary | ICD-10-CM | POA: Diagnosis not present

## 2019-12-16 DIAGNOSIS — H209 Unspecified iridocyclitis: Secondary | ICD-10-CM | POA: Diagnosis not present

## 2019-12-18 ENCOUNTER — Other Ambulatory Visit: Payer: Self-pay | Admitting: Family Medicine

## 2019-12-18 ENCOUNTER — Other Ambulatory Visit: Payer: Self-pay

## 2019-12-18 ENCOUNTER — Encounter: Payer: Self-pay | Admitting: Family Medicine

## 2019-12-18 ENCOUNTER — Ambulatory Visit: Payer: Medicare Other | Attending: Family Medicine | Admitting: Family Medicine

## 2019-12-18 ENCOUNTER — Telehealth: Payer: Self-pay | Admitting: Internal Medicine

## 2019-12-18 VITALS — BP 109/69 | HR 95 | Ht 66.0 in | Wt 161.0 lb

## 2019-12-18 DIAGNOSIS — R197 Diarrhea, unspecified: Secondary | ICD-10-CM | POA: Diagnosis present

## 2019-12-18 DIAGNOSIS — F419 Anxiety disorder, unspecified: Secondary | ICD-10-CM | POA: Insufficient documentation

## 2019-12-18 DIAGNOSIS — J449 Chronic obstructive pulmonary disease, unspecified: Secondary | ICD-10-CM | POA: Diagnosis not present

## 2019-12-18 DIAGNOSIS — F17210 Nicotine dependence, cigarettes, uncomplicated: Secondary | ICD-10-CM | POA: Diagnosis not present

## 2019-12-18 DIAGNOSIS — Z23 Encounter for immunization: Secondary | ICD-10-CM | POA: Diagnosis not present

## 2019-12-18 DIAGNOSIS — K219 Gastro-esophageal reflux disease without esophagitis: Secondary | ICD-10-CM | POA: Diagnosis not present

## 2019-12-18 DIAGNOSIS — Z79899 Other long term (current) drug therapy: Secondary | ICD-10-CM | POA: Diagnosis not present

## 2019-12-18 DIAGNOSIS — R0609 Other forms of dyspnea: Secondary | ICD-10-CM

## 2019-12-18 DIAGNOSIS — K5909 Other constipation: Secondary | ICD-10-CM | POA: Diagnosis not present

## 2019-12-18 DIAGNOSIS — E785 Hyperlipidemia, unspecified: Secondary | ICD-10-CM | POA: Diagnosis not present

## 2019-12-18 DIAGNOSIS — G2581 Restless legs syndrome: Secondary | ICD-10-CM | POA: Diagnosis not present

## 2019-12-18 DIAGNOSIS — R635 Abnormal weight gain: Secondary | ICD-10-CM | POA: Diagnosis not present

## 2019-12-18 DIAGNOSIS — Z6825 Body mass index (BMI) 25.0-25.9, adult: Secondary | ICD-10-CM | POA: Insufficient documentation

## 2019-12-18 DIAGNOSIS — Z7982 Long term (current) use of aspirin: Secondary | ICD-10-CM | POA: Insufficient documentation

## 2019-12-18 DIAGNOSIS — N951 Menopausal and female climacteric states: Secondary | ICD-10-CM

## 2019-12-18 MED ORDER — ALBUTEROL SULFATE HFA 108 (90 BASE) MCG/ACT IN AERS: 2.0000 | INHALATION_SPRAY | Freq: Four times a day (QID) | RESPIRATORY_TRACT | 0 refills | Status: DC | PRN

## 2019-12-18 MED ORDER — HYDROCORT-PRAMOXINE (PERIANAL) 2.5-1 % EX CREA: 1.0000 "application " | TOPICAL_CREAM | Freq: Three times a day (TID) | CUTANEOUS | 1 refills | Status: DC

## 2019-12-18 NOTE — Progress Notes (Signed)
States that she has done her sleep study and has obtained her CPAP machine.  States that she has starting smoking.  Concerned about weight gain.  Breathing has gotten a little worse this last week.  Eye doctor has referred her to rheumatologist.  Currently on prednisone.

## 2019-12-18 NOTE — Patient Instructions (Signed)
Orlistat capsules What is this medicine? Orlistat (OR li stat) is used to help people lose weight and maintain weight loss while eating a reduced-calorie diet. This medicine decreases the amount of fat that is absorbed from your diet. This medicine may be used for other purposes; ask your health care provider or pharmacist if you have questions. COMMON BRAND NAME(S): alli, Xenical What should I tell my health care provider before I take this medicine? They need to know if you have any of these conditions:  an eating disorder, such as anorexia or bulimia  diabetes  gallbladder disease  history of irregular heart beat  HIV or AIDS  kidney stones  liver disease or hepatitis  organ transplant  pancreatic disease  problems absorbing food  seizures  stomach or intestine problems  thyroid disease  take medicines that treat or prevent blood clots  an unusual or allergic reaction to orlistat, other medicines, foods, dyes, supplements or preservatives  pregnant or trying to get pregnant  breast-feeding How should I use this medicine? Take this medicine by mouth with a glass of water. Follow the directions on the prescription or product label. Take this medicine with each main meal that contains about 30 percent of the calories from fat or within 1 hour after each meal. Do not take your medicine more often than directed. If you occasionally miss a meal or have a meal without fat, you can skip that dose of this medicine. Talk to your pediatrician regarding the use of this medicine in children. Special care may be needed. While this drug may be prescribed for children as young as 12 years for selected conditions, precautions do apply. Use of this medicine without a prescription is not approved in children less than 18 years. Overdosage: If you think you have taken too much of this medicine contact a poison control center or emergency room at once. NOTE: This medicine is only for you.  Do not share this medicine with others. What if I miss a dose? If you miss a dose, take it within 1 hour following the meal that contains fat. If it is almost time for your next dose, take only that dose. Do not take double or extra doses. If you occasionally miss a meal or have a meal without fat, you can skip that dose of this medicine. What may interact with this medicine?  amiodarone  certain antivirals for HIV or hepatitis  certain medicines that treat or prevent blood clots like warfarin, enoxaparin, dalteparin, apixaban, dabigatran, edoxaban, and rivaroxaban  cyclosporine  medicines for diabetes  medicines for seizures  other medicines or herbal and dietary supplements for weight loss  supplements like vitamins A, beta-carotene, D, E and K  thyroid hormones  warfarin This list may not describe all possible interactions. Give your health care provider a list of all the medicines, herbs, non-prescription drugs, or dietary supplements you use. Also tell them if you smoke, drink alcohol, or use illegal drugs. Some items may interact with your medicine. What should I watch for while using this medicine? Visit your health care professional for regular checks on your progress. This medicine can cause decreased absorption of some vitamins. You should take a daily multivitamin that contains normal amounts of vitamins D, E, K and beta-carotene or vitamin A. Take the multivitamin once per day at bedtime unless otherwise directed by your doctor or healthcare professional. Discuss the foods you eat and the vitamins you take with your health care professional. You should use  this medicine with a reduced-calorie diet that contains no more than about 30 percent of the calories from fat. Divide your daily intake of fat, carbohydrates, and protein evenly over your 3 main meals. Follow a well-balanced, reduced-calorie, low fat diet. Try starting this diet before taking this medicine. Following a low  fat diet can help reduce the possible side effects from this medicine. Women should inform their health care provider if they wish to become pregnant or think they might be pregnant. Losing weight while pregnant is not advised and may cause harm to the unborn child. Talk to your health care provider for more information. Do not use this medicine if you have had an organ transplant. This medicine interferes with some medicines used to prevent transplant rejection. What side effects may I notice from receiving this medicine? Side effects that you should report to your doctor or health care professional as soon as possible:  allergic reactions like skin rash, itching or hives, swelling of the face, lips, or tongue  breathing problems  bloody or black, tarry stools  signs and symptoms of kidney stones like blood in the urine; pain in the lower back or side; pain when urinating  signs and symptoms of liver injury like dark yellow or brown urine; general ill feeling or flu-like symptoms; light-colored stools; loss of appetite; nausea; right upper belly pain; unusually weak or tired; yellowing of the eyes or skin  trouble passing urine or change in the amount of urine  uncontrolled, urgent bowel movements  vomiting Side effects that usually do not require medical attention (report to your doctor or health care professional if they continue or are bothersome):  gas  increased number of bowel movements  oily stools (bowel movements may be clear, orange or brown in color)  upset stomach This list may not describe all possible side effects. Call your doctor for medical advice about side effects. You may report side effects to FDA at 1-800-FDA-1088. Where should I keep my medicine? Keep out of the reach of children. Storage at room temperature between 15 and 30 degrees C (59 and 86 degrees F). Keep container tightly closed. Throw away any unused medicine after the expiration date. NOTE: This  sheet is a summary. It may not cover all possible information. If you have questions about this medicine, talk to your doctor, pharmacist, or health care provider.  2020 [employer]vier/Gold Standard (2019-02-08 13:57:59)

## 2019-12-18 NOTE — Telephone Encounter (Signed)
Spoke with patient, she stated that the dental appliance was not going to be covered under her insurance and wanted to know if there was something that could be done to get it covered for sleep apnea.  I advised her to speak with Dr. Rozann Lesches office about that issue.  She was also asking about returning the CPap machine as she is not using it and with the dental appliance would not be able to use it.  She also stated that Dr. Rozann Lesches office needed something from Dr. Annamaria Boots for her to get her dental appliance.  She was asking about having an overnight oximetry to see if she needs oxygen at night.  Dr. Annamaria Boots, please advise.  Thank you.
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
## 2019-12-19 ENCOUNTER — Telehealth: Payer: Self-pay | Admitting: Family Medicine

## 2019-12-19 NOTE — Telephone Encounter (Signed)
Pt was advised that the Rx for hydrocortisone-pramoxine (ANALPRAM HC) 2.5-1 % rectal cream Was over $200 and not covered by insurance and needs a PA or pharmacy advised for her to  Switch to a formulary drug that's covered if possible   Pt was advised to look into getting over the counter Orlistat for weight loss / Pt is concerned about her taking Lamictal for seizures/ Pt has been seizure free for about 7 years / please advise

## 2019-12-19 NOTE — Telephone Encounter (Signed)
Spoke with patient regarding prior message. Was able to get patient's Dentist number and address.   [name] [phone]  Patient also wanted to know what she should do with her CPAP.Dr.Young can you please advise.

## 2019-12-19 NOTE — Telephone Encounter (Signed)
Will route to PCP.  Will inform patient to get in touch with neurology for lamictal medications.

## 2019-12-19 NOTE — Telephone Encounter (Signed)
We know her oxygen level drops at night.  We need to show that treatment for sleep apnea is not sufficient to control oxygen level, otherwise insurance won't provide oxygen.  So the first thing we need to do is see if her dentist can provide the oral appliance. Can Ms [name] please provide her dentist's first and last names, and address?    will send a letter.

## 2019-12-20 MED ORDER — HYDROCORTISONE (PERIANAL) 1 % EX CREA: 1.0000 "application " | TOPICAL_CREAM | Freq: Three times a day (TID) | CUTANEOUS | 0 refills | Status: DC

## 2019-12-20 NOTE — Telephone Encounter (Signed)
Insurance will cover HC topical. I pended the specific product covered. Will route to PCP.

## 2019-12-20 NOTE — Telephone Encounter (Signed)
Can you please switch Analpram to the formulary medication for this patient?  Thank you

## 2019-12-20 NOTE — Telephone Encounter (Signed)
Prescription sent to pharmacy.

## 2019-12-20 NOTE — Telephone Encounter (Signed)
Pt calling back to ask when this can be done.  Pt is trying to get before the weekend.

## 2019-12-25 DIAGNOSIS — G4733 Obstructive sleep apnea (adult) (pediatric): Secondary | ICD-10-CM | POA: Insufficient documentation

## 2019-12-25 NOTE — Assessment & Plan Note (Signed)
Strongly encourage her to work with patches to quit smooking and offered torefer her to pharmacy smoking cessation team Plan- PFT

## 2019-12-25 NOTE — Assessment & Plan Note (Signed)
Couldn't tolerate CPAP. She is going to see if her dentist can make an oral advancement device to treat OSA.

## 2019-12-25 NOTE — Assessment & Plan Note (Signed)
After discussion, we will continue clonazepam for insomnia, with emphasis on sleep hygiene

## 2019-12-26 NOTE — Telephone Encounter (Signed)
Pt returning a phone call. Pt can be reached at [phone]. She sees Dr. Graciella Freer on 9/23.

## 2019-12-27 NOTE — Telephone Encounter (Signed)
Left message for patient to call back  

## 2019-12-27 NOTE — Telephone Encounter (Signed)
Calling to see if [place] would fax over the letter about the dental appliance faxed to [phone]. Pt can be reached at [phone]. Also stated about a letter from [facility] about her not using her CPAP and pt stated that Cy about her not using the cpap.

## 2019-12-28 NOTE — Telephone Encounter (Signed)
Spoke with the pt  She states received letter from [place] about them no longer covering the CPAP d/t non compliance  She is wanting [place] to send letter to her dentist re dental appliance trial   Darel Hong Aneth [phone] Fax [phone]   Please advise thanks

## 2019-12-30 ENCOUNTER — Encounter: Payer: Self-pay | Admitting: Internal Medicine

## 2019-12-30 ENCOUNTER — Telehealth: Payer: Self-pay | Admitting: Allergy

## 2019-12-30 NOTE — Telephone Encounter (Signed)
Patient states she has been getting a lot of mucus in her right eye mostly for about a week and her eye doctor mentioned that she should talk to her allergist. Patient remembers that she is allergic to cats and has had a "part-time" cat for about 2 months. Patient wonders if the mucus could be from the cat and if she should be on an antihistamine eye drop. Patient takes Benadryl at night which does not help.  Please advise.

## 2019-12-30 NOTE — Telephone Encounter (Signed)
I called patient back and she states she has been having this problem for the past week. White discharge coming from her right eye. She states it only feels irritated. No pain or itching. Patient would like something called in to [facility] in  [place]. Please advice.

## 2019-12-30 NOTE — Telephone Encounter (Signed)
I called patient back and explained to her that we will call her when we get a response from Dr. Nelva Bush. Patient verbalized understanding.

## 2019-12-30 NOTE — Telephone Encounter (Signed)
Patient called back checking on this message. She is having transportation issues and needed to know if something could be called in and when so she can plan her transportation around this.

## 2019-12-31 MED ORDER — OLOPATADINE HCL 0.2 % OP SOLN: OPHTHALMIC | 1 refills | Status: DC

## 2019-12-31 NOTE — Telephone Encounter (Signed)
Is she having crusting/matting of the eyelashes as well?  We can go ahead and send in an allergy eyedrop like Olopatadaine 0.2% 1 drop each eye daily as needed for itchy/watery/red eyes

## 2019-12-31 NOTE — Telephone Encounter (Signed)
Letter sent to Dr Graciella Freer for consideration of oaral appliance.

## 2019-12-31 NOTE — Telephone Encounter (Signed)
Called and spoke to patient and she stated that she does not have any crusting/matting of the eyelashes. I did inform her that we were going to send in olopatadine 1 drop each eye daily as needed. Patient expressed understanding.

## 2019-12-31 NOTE — Telephone Encounter (Signed)
Lmtcb for pt.  

## 2019-12-31 NOTE — Telephone Encounter (Signed)
Patient called back again regarding her message.  Please Advise.

## 2020-01-01 NOTE — Telephone Encounter (Signed)
Patient is returning phone call. Patient states putting nicotine patch on. Patient phone number is [phone].

## 2020-01-02 ENCOUNTER — Telehealth: Payer: Self-pay | Admitting: Family Medicine

## 2020-01-02 NOTE — Telephone Encounter (Signed)
Left VM for pt to return call.

## 2020-01-02 NOTE — Telephone Encounter (Signed)
Copied from [place] [phone]. Topic: General - Other >> Jan 01, 2020  1:46 PM Leward Quan A wrote: Reason for CRM: Patient called to inform Dr Margarita Rana that for the past few weeks she have been having an excessive amount of saliva developing in the corners of her mouth where she have to keep wiping. Also wanted to let Dr Margarita Rana know that she stopped smoking for 2 months but started back smoking a month and a half ago. Asking from advise from Dr Margarita Rana on the saliva issue. Please call Ph# [phone] >> Jan 02, 2020  3:16 PM Mcneil, Jacinto Reap wrote: Pt stated she has not heard back from anyone regarding her message about excessive saliva. Pt requests call back asap

## 2020-01-02 NOTE — Telephone Encounter (Signed)
Spoke with the pt and notified that the letter has been sent to Dr Graciella Freer

## 2020-01-02 NOTE — Telephone Encounter (Signed)
Patient was called and set up a telephone visit to discuss recent concerns

## 2020-01-06 ENCOUNTER — Telehealth: Payer: Self-pay | Admitting: Internal Medicine

## 2020-01-06 ENCOUNTER — Ambulatory Visit (HOSPITAL_COMMUNITY): Payer: Medicare Other

## 2020-01-06 NOTE — Telephone Encounter (Signed)
Called and spoke with pt and have scheduled her a pharmacy visit 01/13/20. Nothing further needed.

## 2020-01-06 NOTE — Telephone Encounter (Signed)
Please refer her to our Pharmacy smoking cessation program.

## 2020-01-06 NOTE — Telephone Encounter (Signed)
Spoke with the pt  She states wore a nicotine patch and she went 48 hours without smoking  She states the whole time she obsessed about the thought of smoking  She took the patch off b/c she was afraid that it would cause her heart to race  She is now smoking again  She is wondering if there is anything else that could be prescribed to help with smoking cessation

## 2020-01-07 ENCOUNTER — Ambulatory Visit: Payer: Medicare Other | Admitting: Family Medicine

## 2020-01-09 ENCOUNTER — Ambulatory Visit: Payer: Medicare Other | Attending: Family Medicine | Admitting: Family Medicine

## 2020-01-09 ENCOUNTER — Encounter: Payer: Self-pay | Admitting: Family Medicine

## 2020-01-09 DIAGNOSIS — R0609 Other forms of dyspnea: Secondary | ICD-10-CM

## 2020-01-09 DIAGNOSIS — K117 Disturbances of salivary secretion: Secondary | ICD-10-CM | POA: Diagnosis not present

## 2020-01-09 MED ORDER — ALBUTEROL SULFATE HFA 108 (90 BASE) MCG/ACT IN AERS: 2.0000 | INHALATION_SPRAY | Freq: Four times a day (QID) | RESPIRATORY_TRACT | 3 refills | Status: DC | PRN

## 2020-01-09 MED ORDER — AMITRIPTYLINE HCL 100 MG PO TABS: 100.0000 mg | ORAL_TABLET | Freq: Every day | ORAL | 0 refills | Status: DC
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
## 2020-01-09 NOTE — Progress Notes (Signed)
States that she is having excess saliva.  Needs refills on inhaler.

## 2020-01-12 ENCOUNTER — Other Ambulatory Visit: Payer: Self-pay | Admitting: Family Medicine

## 2020-01-13 ENCOUNTER — Other Ambulatory Visit: Payer: Self-pay

## 2020-01-13 ENCOUNTER — Ambulatory Visit: Payer: Medicare Other | Admitting: Pharmacist

## 2020-01-13 DIAGNOSIS — F17200 Nicotine dependence, unspecified, uncomplicated: Secondary | ICD-10-CM

## 2020-01-13 DIAGNOSIS — Z7189 Other specified counseling: Secondary | ICD-10-CM | POA: Insufficient documentation

## 2020-01-13 NOTE — Progress Notes (Signed)
Subjective Patient presents to [facility] and seen by the pharmacist for smoking cessation counseling. PMH significant for seizures. Patient reports she set a quit date for 01/02/20 and quit for 48 hours using 21 mg patches and 4 mg lozenges, however during those 48 hours, all she could think of was smoking and then resumed. Reports she stopped smoking all together and did not gradually decrease the number of cigars per day because she was scared of having heart problems if she smoked while on the patch. Reports she quit smoking for 7 months in 2020 while using nicotine patches and lozenges.   Social History   Tobacco Use  Smoking Status Current Every Day Smoker  . Packs/day: 1.00  . Years: 50.00  . Pack years: 50.00  . Types: Cigarettes, Cigars  Smokeless Tobacco Never Used  Tobacco Comment   Set quit date for 04/16/20     Tobacco Use History  Age when started using tobacco on a daily basis: 77-70 years old.  Type: cigar - [place] [employer]  Number of cigars per day 20  Smokes first cigarette 20 minutes after waking.  Does wake at night to smoke - about 4 times a night   Triggers include watching TV, drinking coffee, waiting around.  Quit Attempt History   Most recent quit attempt around 01/02/2020 for 48 hours.  Longest time ever been tobacco free was 7 months.  Methods tried in the past include Nicotine Patch and Chantix  Rates IMPORTANCE of quitting tobacco on 1-10 scale of 10.  Rates READINESS of quitting tobacco on 1-10 scale of 9.  Rates CONFIDENCE of quitting tobacco on 1-10 scale of 9.  Motivators to quitting include family   Immunization History  Administered Date(s) Administered  . Influenza Whole 02/09/2007  . Influenza,inj,Quad PF,6+ Mos 12/18/2019  . PFIZER SARS-COV-2 Vaccination 06/11/2019, 07/02/2019  . Pneumococcal Polysaccharide-23 02/09/2007  . Td 08/16/2005     Assessment and Plan  1. Smoking Cessation   Patient states they are  ready to quit smoking.  Patient set quit date of 04/16/20. Explained to patient that there is a low risk of nicotine toxicity as long as she cuts down the number of cigars she smokes daily. Patient verbalized understanding. Patient is agreeable to restarting nicotine patches and continuing nicotine lozenges. Patient counseled on purpose, proper use, and potential adverse effects, including mild itching or redness at the point of application, headache, trouble sleeping, and/or vivid dreams. Counseled on removing patch right before bedtime to reduce risk of vivid dreams and insomnia.    Nicotine Patch Patch Schedule for >10 cigarettes daily Weeks 1-6: one 21 mg patch daily Weeks 7-8: one 14 mg patch daily Weeks 9-10: one 7 mg patch daily  Patch Schedule for <10 cigarettes daily Weeks 1 to 6: one nicotine patch (14 mg) daily. I will call and re-assess how you are doing at the end of 6 weeks to see how you are doing on 14 mg patch and if you are ready to decrease dose of patch. Weeks 7-8: one nicotine patch (7 mg) daily  Nicotine Lozenge Patient counseled on purpose, proper use, and potential adverse effects including nausea, hiccups, cough, and heartburn.  Instructed patient to use  2 mg unless the smoke within 30 minutes of waking up in which they should use 4 mg.  Lozenge dosing schedule Weeks 1 to 6: 1 lozenge every 1 to 2 hours (maximum: 5 lozenges every 6 hours; 20 lozenges/day); to increase chances of quitting, use at  least 9 lozenges/day during the first 6 weeks Weeks 7 to 9: 1 lozenge every 2 to 4 hours (maximum: 5 lozenges every 6 hours; 20 lozenges/day) Weeks 10 to 12: 1 lozenge every 4 to 8 hours (maximum: 5 lozenges every 6 hours; 20 lozenges/day)  Plan: 1. Continue using Nicotine patches and lozenges 2. Goal is to decrease by 1 cigar every other day 3. Quit date set of 04/16/20  2. Medication Reconciliation A drug regimen assessment was performed, including review of allergies,  interactions, disease-state management, dosing and immunization history. Medications were reviewed with the patient, including name, instructions, indication, goals of therapy, potential side effects, importance of adherence, and safe use.  3. Immunizations Patient is indicated for shingles vaccinations.   This appointment required 45 minutes of patient care (this includes precharting, chart review, review of results, face-to-face care, etc.).  Lorel Monaco, PharmD PGY2 Ambulatory Care Resident [place]

## 2020-01-13 NOTE — Patient Instructions (Signed)
Thank you for meeting with the pharmacy team today!  Below find a summary of what we discussed at your visit:   Continue using Nicotine patches and lozenges  Goal is to decrease by 1 cigarettes every other day  Quit Date set of December 30th   Call [phone] with any questions or concerns.  Lorel Monaco, PharmD PGY2 Ambulatory Care Resident [place]

## 2020-01-14 DIAGNOSIS — H30033 Focal chorioretinal inflammation, peripheral, bilateral: Secondary | ICD-10-CM | POA: Diagnosis not present

## 2020-01-14 DIAGNOSIS — H2513 Age-related nuclear cataract, bilateral: Secondary | ICD-10-CM | POA: Diagnosis not present

## 2020-01-14 DIAGNOSIS — H35372 Puckering of macula, left eye: Secondary | ICD-10-CM | POA: Insufficient documentation

## 2020-01-16 ENCOUNTER — Ambulatory Visit
Admission: RE | Admit: 2020-01-16 | Discharge: 2020-01-16 | Disposition: A | Discharge status: Home / Self Care | Payer: Medicare Other | Source: Ambulatory Visit | Attending: Family Medicine | Admitting: Family Medicine

## 2020-01-16 ENCOUNTER — Other Ambulatory Visit: Payer: Self-pay

## 2020-01-16 DIAGNOSIS — Z1231 Encounter for screening mammogram for malignant neoplasm of breast: Secondary | ICD-10-CM

## 2020-01-21 ENCOUNTER — Ambulatory Visit (HOSPITAL_COMMUNITY)
Admission: RE | Admit: 2020-01-21 | Discharge: 2020-01-21 | Disposition: A | Discharge status: Home / Self Care | Payer: Medicare Other | Source: Ambulatory Visit | Attending: Family Medicine | Admitting: Family Medicine

## 2020-01-21 ENCOUNTER — Other Ambulatory Visit: Payer: Self-pay

## 2020-01-21 DIAGNOSIS — F17210 Nicotine dependence, cigarettes, uncomplicated: Secondary | ICD-10-CM | POA: Diagnosis present

## 2020-01-22 ENCOUNTER — Encounter: Payer: Self-pay | Admitting: *Deleted

## 2020-01-22 ENCOUNTER — Other Ambulatory Visit: Payer: Self-pay | Admitting: Family Medicine

## 2020-01-22 DIAGNOSIS — I2510 Atherosclerotic heart disease of native coronary artery without angina pectoris: Secondary | ICD-10-CM

## 2020-01-27 ENCOUNTER — Other Ambulatory Visit: Payer: Self-pay | Admitting: Adult Health

## 2020-01-27 ENCOUNTER — Telehealth: Payer: Self-pay | Admitting: Adult Health

## 2020-01-27 NOTE — Telephone Encounter (Signed)
Pt request refill lamoTRIgine (LAMICTAL) 100 MG tablet at [id number]

## 2020-01-27 NOTE — Telephone Encounter (Signed)
Refill sent to pharmacy.   

## 2020-01-31 ENCOUNTER — Ambulatory Visit: Payer: Medicare Other | Admitting: Allergy

## 2020-02-04 ENCOUNTER — Emergency Department (HOSPITAL_COMMUNITY): Payer: Medicare Other

## 2020-02-04 ENCOUNTER — Emergency Department (HOSPITAL_COMMUNITY)
Admission: EM | Admit: 2020-02-04 | Discharge: 2020-02-04 | Disposition: A | Discharge status: Home / Self Care | Payer: Medicare Other | Attending: Emergency Medicine | Admitting: Emergency Medicine

## 2020-02-04 ENCOUNTER — Other Ambulatory Visit: Payer: Self-pay

## 2020-02-04 ENCOUNTER — Encounter (HOSPITAL_COMMUNITY): Payer: Self-pay

## 2020-02-04 DIAGNOSIS — R112 Nausea with vomiting, unspecified: Secondary | ICD-10-CM | POA: Insufficient documentation

## 2020-02-04 DIAGNOSIS — R197 Diarrhea, unspecified: Secondary | ICD-10-CM

## 2020-02-04 DIAGNOSIS — J449 Chronic obstructive pulmonary disease, unspecified: Secondary | ICD-10-CM | POA: Insufficient documentation

## 2020-02-04 DIAGNOSIS — Z79899 Other long term (current) drug therapy: Secondary | ICD-10-CM | POA: Diagnosis not present

## 2020-02-04 DIAGNOSIS — E876 Hypokalemia: Secondary | ICD-10-CM | POA: Diagnosis not present

## 2020-02-04 DIAGNOSIS — Z96641 Presence of right artificial hip joint: Secondary | ICD-10-CM | POA: Insufficient documentation

## 2020-02-04 DIAGNOSIS — Z7982 Long term (current) use of aspirin: Secondary | ICD-10-CM | POA: Insufficient documentation

## 2020-02-04 DIAGNOSIS — Z96642 Presence of left artificial hip joint: Secondary | ICD-10-CM | POA: Diagnosis not present

## 2020-02-04 DIAGNOSIS — F17210 Nicotine dependence, cigarettes, uncomplicated: Secondary | ICD-10-CM | POA: Insufficient documentation

## 2020-02-04 DIAGNOSIS — R1110 Vomiting, unspecified: Secondary | ICD-10-CM | POA: Diagnosis not present

## 2020-02-04 DIAGNOSIS — R109 Unspecified abdominal pain: Secondary | ICD-10-CM | POA: Diagnosis not present

## 2020-02-04 DIAGNOSIS — F419 Anxiety disorder, unspecified: Secondary | ICD-10-CM | POA: Diagnosis not present

## 2020-02-04 LAB — URINALYSIS, ROUTINE W REFLEX MICROSCOPIC
Glucose, UA: NEGATIVE mg/dL
Ketones, ur: 15 mg/dL — AB
Leukocytes,Ua: NEGATIVE
Nitrite: NEGATIVE
Protein, ur: 30 mg/dL — AB
Specific Gravity, Urine: 1.025 (ref 1.005–1.030)
pH: 6.5 (ref 5.0–8.0)

## 2020-02-04 LAB — URINALYSIS, MICROSCOPIC (REFLEX)

## 2020-02-04 LAB — LIPASE, BLOOD: Lipase: 21 U/L (ref 11–51)

## 2020-02-04 LAB — COMPREHENSIVE METABOLIC PANEL WITH GFR
ALT: 18 U/L (ref 0–44)
AST: 19 U/L (ref 15–41)
Albumin: 3.6 g/dL (ref 3.5–5.0)
Alkaline Phosphatase: 88 U/L (ref 38–126)
Anion gap: 16 — ABNORMAL HIGH (ref 5–15)
BUN: 11 mg/dL (ref 8–23)
CO2: 26 mmol/L (ref 22–32)
Calcium: 8.7 mg/dL — ABNORMAL LOW (ref 8.9–10.3)
Chloride: 93 mmol/L — ABNORMAL LOW (ref 98–111)
Creatinine, Ser: 1.05 mg/dL — ABNORMAL HIGH (ref 0.44–1.00)
GFR, Estimated: 55 mL/min — ABNORMAL LOW
Glucose, Bld: 136 mg/dL — ABNORMAL HIGH (ref 70–99)
Potassium: 2.6 mmol/L — CL (ref 3.5–5.1)
Sodium: 135 mmol/L (ref 135–145)
Total Bilirubin: 0.7 mg/dL (ref 0.3–1.2)
Total Protein: 7 g/dL (ref 6.5–8.1)

## 2020-02-04 LAB — CBC
HCT: 49.5 % — ABNORMAL HIGH (ref 36.0–46.0)
Hemoglobin: 17.3 g/dL — ABNORMAL HIGH (ref 12.0–15.0)
MCH: 31.3 pg (ref 26.0–34.0)
MCHC: 34.9 g/dL (ref 30.0–36.0)
MCV: 89.7 fL (ref 80.0–100.0)
Platelets: 490 10*3/uL — ABNORMAL HIGH (ref 150–400)
RBC: 5.52 MIL/uL — ABNORMAL HIGH (ref 3.87–5.11)
RDW: 15.4 % (ref 11.5–15.5)
WBC: 18.5 10*3/uL — ABNORMAL HIGH (ref 4.0–10.5)
nRBC: 0 % (ref 0.0–0.2)

## 2020-02-04 LAB — MAGNESIUM: Magnesium: 1.7 mg/dL (ref 1.7–2.4)

## 2020-02-04 LAB — C DIFFICILE QUICK SCREEN W PCR REFLEX
C Diff antigen: NEGATIVE
C Diff interpretation: NOT DETECTED
C Diff toxin: NEGATIVE

## 2020-02-04 MED ORDER — POTASSIUM CHLORIDE CRYS ER 20 MEQ PO TBCR: 20.0000 meq | EXTENDED_RELEASE_TABLET | Freq: Every day | ORAL | 0 refills | Status: DC

## 2020-02-04 MED ORDER — POTASSIUM CHLORIDE CRYS ER 20 MEQ PO TBCR
40.0000 meq | EXTENDED_RELEASE_TABLET | Freq: Once | ORAL | Status: AC
  Administered 2020-02-04: 40 meq via ORAL
  Filled 2020-02-04: qty 2

## 2020-02-04 MED ORDER — IOHEXOL 300 MG/ML  SOLN
100.0000 mL | Freq: Once | INTRAMUSCULAR | Status: AC | PRN
  Administered 2020-02-04: 100 mL via INTRAVENOUS

## 2020-02-04 MED ORDER — SODIUM CHLORIDE (PF) 0.9 % IJ SOLN
INTRAMUSCULAR | Status: AC
  Filled 2020-02-04: qty 50

## 2020-02-04 MED ORDER — POTASSIUM CHLORIDE 10 MEQ/100ML IV SOLN
10.0000 meq | INTRAVENOUS | Status: AC
  Administered 2020-02-04 (×2): 10 meq via INTRAVENOUS
  Filled 2020-02-04 (×2): qty 100

## 2020-02-04 MED ORDER — SODIUM CHLORIDE 0.9 % IV BOLUS
1000.0000 mL | Freq: Once | INTRAVENOUS | Status: AC
  Administered 2020-02-04: 1000 mL via INTRAVENOUS

## 2020-02-04 MED ORDER — ONDANSETRON HCL 4 MG/2ML IJ SOLN
4.0000 mg | Freq: Once | INTRAMUSCULAR | Status: AC
  Administered 2020-02-04: 4 mg via INTRAVENOUS
  Filled 2020-02-04: qty 2

## 2020-02-04 MED ORDER — MAGNESIUM OXIDE 400 (241.3 MG) MG PO TABS
400.0000 mg | ORAL_TABLET | Freq: Once | ORAL | Status: AC
  Administered 2020-02-04: 400 mg via ORAL
  Filled 2020-02-04: qty 1

## 2020-02-04 NOTE — ED Triage Notes (Addendum)
Patient states she has had N/v/D since 02/24/20. Patient states she has been taking Imodium with no relief.  Patient states she goes to a pain clinic and tested + for cocaine. Patient was not given any pain medicines from the pain clinic at that time. Patient states that is when she developed diarrhea. Patient states she had recently been given a new Rx. for Hydrocone 10 mg and states she was taking them hoping it would harden up her stool.
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
## 2020-02-04 NOTE — ED Provider Notes (Signed)
Patient was received at handoff from [place] [facility] she provided HPI, current work-up, likely disposition please see her note for full detail.  In short patient presents with chief complaint of weakness and watery diarrhea x10 days.  Patient states she has had multiple episodes of watery diarrhea, denies seeing blood or dark tarry stools.  She denies any abdominal pain or vomiting but does admit that she has had some nausea and aversion to food.  She states she was chronically on opioids for chronic interstitial cystitis but she tested positive for cocaine at pain management clinic and they stopped her narcotics.  She states that is when her diarrhea started and feels like she is suffering from opioid withdrawals.  Lab work shows CBC with leukocytosis of 18.5, hemoconcentrated 5.52, hemoglobin 17.3.  CMP showing hypokalemia of 2.6, hyperglycemia of 136, elevated creatinine 1.05, no anion gap noted.  C. difficile quick screen negative for C. difficile.  UA showing trace hemoglobin, ketones, protein, negative nitrates or leukocytes, few bacteria.  CT abdomen pelvis pending. EKG showing sinus rhythm without signs of ischemia no ST elevation or depression noted.  Due to hypokalemia prior provider start her on IV and p.o. potassium.  Will add magnesium for further evaluation.  Physical Exam  BP 105/74   Pulse 84   Temp 98.6 F (37 C) (Oral)   Resp 18   Ht 5\' 6"  (1.676 m)   Wt 65.8 kg   SpO2 94%   BMI 23.40 kg/m   Physical Exam Vitals and nursing note reviewed.  Constitutional:      General: She is not in acute distress.    Appearance: She is not ill-appearing.  HENT:     Head: Normocephalic and atraumatic.     Nose: No congestion.     Mouth/Throat:     Mouth: Mucous membranes are moist.     Pharynx: Oropharynx is clear.  Eyes:     General: No scleral icterus. Cardiovascular:     Rate and Rhythm: Normal rate and regular rhythm.     Pulses: Normal pulses.     Heart sounds: No murmur  heard.  No friction rub. No gallop.   Pulmonary:     Effort: No respiratory distress.     Breath sounds: No wheezing, rhonchi or rales.  Abdominal:     General: There is no distension.     Palpations: Abdomen is soft.     Tenderness: There is no abdominal tenderness. There is no right CVA tenderness, left CVA tenderness or guarding.  Musculoskeletal:        General: No swelling.     Right lower leg: No edema.     Left lower leg: No edema.  Skin:    General: Skin is warm and dry.     Findings: No rash.  Neurological:     Mental Status: She is alert.  Psychiatric:        Mood and Affect: Mood normal.     ED Course/Procedures     Procedures  MDM  I have personally reviewed all imaging, labs and have interpreted them.  CT abdomen pelvis shows fluid-filled stomach and small intestine without evidence of partial small bowel obstruction or large bowel obstruction.  Does show gallbladder is distended but does not show evidence of cholecystitis.  Patient has a magnesium of 1.7.  Will provide her 400 mg of magnesium oxide.  Patient is reevaluated and is tolerating p.o. without difficulty.  She states she is feeling much better and  is ready to go home.  I have low suspicion for systemic infection as patient is nontoxic-appearing, vital signs reassuring, no obvious source infection on exam.  I suspect leukocytosis is secondary to dehydration as well as acute phase reactant from consistent diarrhea.  Low suspicion for intra-abdominal abnormality requiring surgical intervention as patient's abdomen was nontender to palpation, patient is tolerating p.o., no acute findings seen on CT.  Low suspicion for cardiac abnormality as patient has chest pain, shortness of breath, no signs of hypoperfusion or fluid overload on exam, EKG sinus rhythm without signs of ischemia.  Vital signs have remained stable, no indication for hospital admission.  Patient discussed with attending and they agreed with  assessment and plan.  Patient given at home care as well strict return precautions.  Patient verbalized that they understood agreed to said plan.        Marcello Fennel, PA-C 02/04/20 1913    Lucrezia Starch, MD 02/05/20 [phone]

## 2020-02-04 NOTE — ED Notes (Signed)
RN provided ginger ale to patient.

## 2020-02-04 NOTE — Discharge Instructions (Signed)
Seen here for diarrhea.  Lab work and imaging all looks reassuring.  I prescribed you potassium pills please take 1 today for the next 5 days.  also given you information to help you stop diarrhea please read.  Please remember to stay hydrated.  Follow-up with your PCP for further evaluation management as needed.  Come back to the emergency department if you develop chest pain, shortness of breath, severe abdominal pain, uncontrolled nausea, vomiting, diarrhea.

## 2020-02-05 LAB — GASTROINTESTINAL PANEL BY PCR, STOOL (REPLACES STOOL CULTURE)

## 2020-02-11 ENCOUNTER — Ambulatory Visit: Payer: Medicare Other | Admitting: Cardiology

## 2020-02-14 ENCOUNTER — Other Ambulatory Visit: Payer: Self-pay | Admitting: Family Medicine

## 2020-02-14 DIAGNOSIS — E782 Mixed hyperlipidemia: Secondary | ICD-10-CM

## 2020-02-17 ENCOUNTER — Other Ambulatory Visit: Payer: Self-pay | Admitting: Family Medicine

## 2020-02-17 ENCOUNTER — Telehealth: Payer: Self-pay | Admitting: Family Medicine

## 2020-02-17 DIAGNOSIS — K117 Disturbances of salivary secretion: Secondary | ICD-10-CM

## 2020-02-17 MED ORDER — AMITRIPTYLINE HCL 100 MG PO TABS: 100.0000 mg | ORAL_TABLET | Freq: Every day | ORAL | 0 refills | Status: DC

## 2020-02-17 NOTE — Telephone Encounter (Signed)
Pt is calling and does not have any more refills on amitriptyline 100 mg and did not call her pharm. Pt was taking medication due to the increase saliva in the corner of her mouth and would like to know if dr Margarita Rana would give her a refill. [address]

## 2020-02-17 NOTE — Telephone Encounter (Signed)
FYI Pt is calling to report to dr Margarita Rana that she has lost 25 pounds in the last month due to GI issues such as nausea, diarrhea, vomiting and dizziness. Pt has an appt with [facility] on 02-18-2020. Pt has make a trip to [place] er and was found to have low potassium due to diarrhea and vomiting and pt was given IV potassium

## 2020-02-17 NOTE — Telephone Encounter (Signed)
Requested Prescriptions  Pending Prescriptions Disp Refills  . amitriptyline (ELAVIL) 100 MG tablet 30 tablet 0    Sig: Take 1 tablet (100 mg total) by mouth at bedtime.     Psychiatry:  Antidepressants - Heterocyclics (TCAs) Passed - 02/17/2020 12:26 PM      Passed - Completed PHQ-2 or PHQ-9 in the last 360 days      Passed - Valid encounter within last 6 months    Recent Outpatient Visits          1 month ago [place], Enobong, MD   2 months ago Other constipation   [place], Enobong, MD   3 months ago Other constipation   [place], Charlane Ferretti, MD   6 months ago Acute pain of left shoulder   [place], Enobong, MD   8 months ago Menopausal vasomotor syndrome   [place], MD      Future Appointments            In 4 months Charlott Rakes, MD [place]

## 2020-02-17 NOTE — Telephone Encounter (Signed)
FYI

## 2020-02-18 ENCOUNTER — Other Ambulatory Visit: Payer: Self-pay | Admitting: Physician Assistant

## 2020-02-18 DIAGNOSIS — R1011 Right upper quadrant pain: Secondary | ICD-10-CM | POA: Diagnosis not present

## 2020-02-18 DIAGNOSIS — R112 Nausea with vomiting, unspecified: Secondary | ICD-10-CM | POA: Diagnosis not present

## 2020-02-18 DIAGNOSIS — R197 Diarrhea, unspecified: Secondary | ICD-10-CM | POA: Diagnosis not present

## 2020-02-18 DIAGNOSIS — K2270 Barrett's esophagus without dysplasia: Secondary | ICD-10-CM | POA: Diagnosis not present

## 2020-02-18 DIAGNOSIS — Z86010 Personal history of colonic polyps: Secondary | ICD-10-CM | POA: Diagnosis not present

## 2020-02-20 ENCOUNTER — Telehealth: Payer: Self-pay | Admitting: Internal Medicine

## 2020-02-21 NOTE — Telephone Encounter (Signed)
Called and spoke to patient.  Patient would like Dr. Janee Morn recommendations on dentists that do oral mouthpieces for OSA.  Dr. Annamaria Boots, please advise. Thanks

## 2020-02-21 NOTE — Telephone Encounter (Signed)
Spoke to patient and relayed below recommendations.  Patient will research both providers and call back with name of whom she would like to see so referral can be placed. Nothing further needed at this time.

## 2020-02-21 NOTE — Telephone Encounter (Signed)
Suggest Dr Oneal Grout, Orthodontist          Or  Dr Augustina Mood, Dentist

## 2020-02-25 ENCOUNTER — Other Ambulatory Visit: Payer: Self-pay | Admitting: Family Medicine

## 2020-02-25 NOTE — Telephone Encounter (Signed)
Medication Refill - Medication: Potassium chloride   Has the patient contacted their pharmacy? Yes.   PT states that her potassium was really low in the hospital. Please advise.  (Agent: If no, request that the patient contact the pharmacy for the refill.) (Agent: If yes, when and what did the pharmacy advise?)  Preferred Pharmacy (with phone number or street name):  Agent: [id number] [address], [address] AT [address]  [place] [address]  Phone: [phone] Fax: [phone]  Hours: Not open 24 hours   e be advised that RX refills may take up to 3 business days. We ask that you follow-up with your pharmacy.

## 2020-02-25 NOTE — Telephone Encounter (Signed)
  Notes to clinic:  Patient states that her potassium was low in the hospital Please review for refill Script has expired    Requested Prescriptions  Pending Prescriptions Disp Refills   potassium chloride SA (KLOR-CON) 20 MEQ tablet 5 tablet 0    Sig: Take 1 tablet (20 mEq total) by mouth daily for 5 days.      Endocrinology:  Minerals - Potassium Supplementation Failed - 02/25/2020  1:50 PM      Failed - K in normal range and within 360 days    Potassium  Date Value Ref Range Status  02/04/2020 2.6 (LL) 3.5 - 5.1 mmol/L Final    Comment:    CRITICAL RESULT CALLED TO, READ BACK BY AND VERIFIED WITH: S.WEST AT 1355 ON 02/04/20 BY N.THOMPSON           Failed - Cr in normal range and within 360 days    Creatinine, Ser  Date Value Ref Range Status  02/04/2020 1.05 (H) 0.44 - 1.00 mg/dL Final          Passed - Valid encounter within last 12 months    Recent Outpatient Visits           1 month ago [place], Enobong, MD   2 months ago Other constipation   [place], Enobong, MD   3 months ago Other constipation   [place], Charlane Ferretti, MD   6 months ago Acute pain of left shoulder   [place], Enobong, MD   8 months ago Menopausal vasomotor syndrome   [place] Charlott Rakes, MD       Future Appointments             In 2 weeks [name], MD [place], [facility]   In 3 months Charlott Rakes, MD [place]

## 2020-02-26 ENCOUNTER — Telehealth: Payer: Self-pay | Admitting: *Deleted

## 2020-02-26 NOTE — Telephone Encounter (Signed)
Patient would like the below request expedited, informed patient please allow 48 to 72 hour turn around time, patient states she will run out.   [facility] [place], [address] [place] Phone:  [phone]  Fax:  [phone]

## 2020-02-26 NOTE — Telephone Encounter (Signed)
Ortho bundle 1 year call completed for Left total hip replacement.

## 2020-02-28 ENCOUNTER — Telehealth: Payer: Self-pay | Admitting: Family Medicine

## 2020-02-28 ENCOUNTER — Other Ambulatory Visit: Payer: Medicare Other

## 2020-02-28 DIAGNOSIS — E876 Hypokalemia: Secondary | ICD-10-CM

## 2020-02-28 NOTE — Telephone Encounter (Signed)
Patient is requesting refill on potassium medication.

## 2020-02-28 NOTE — Telephone Encounter (Signed)
Copied from [place] [phone]. Topic: General - Other >> Feb 28, 2020  2:40 PM Leward Quan A wrote: Reason for CRM: Patient called to inquire of Dr Margarita Rana why she did not send Rx to the pharmacy for Potassium. Asking for a call back from Dr Margarita Rana nurse Ph# [phone]

## 2020-03-02 NOTE — Telephone Encounter (Signed)
I do not see a previous request for potassium refill.  It was prescribed as a short-term medication at the ED when she presented with diarrhea and hypokalemia.  I have ordered repeat potassium level which will guide the need for refill or not.

## 2020-03-02 NOTE — Telephone Encounter (Signed)
Patient was called and patient is very upset as to why she has not received her potassium medications, patient was informed that she needs to come in and get blood work and she refused and hung up the phone.

## 2020-03-07 ENCOUNTER — Other Ambulatory Visit: Payer: Self-pay | Admitting: Obstetrics & Gynecology

## 2020-03-07 DIAGNOSIS — N951 Menopausal and female climacteric states: Secondary | ICD-10-CM

## 2020-03-10 ENCOUNTER — Ambulatory Visit (INDEPENDENT_AMBULATORY_CARE_PROVIDER_SITE_OTHER): Payer: Medicare Other | Admitting: Cardiology

## 2020-03-10 ENCOUNTER — Other Ambulatory Visit: Payer: Self-pay

## 2020-03-10 VITALS — BP 103/69 | HR 84 | Ht 66.0 in | Wt 141.0 lb

## 2020-03-10 DIAGNOSIS — E876 Hypokalemia: Secondary | ICD-10-CM | POA: Diagnosis not present

## 2020-03-10 DIAGNOSIS — E785 Hyperlipidemia, unspecified: Secondary | ICD-10-CM

## 2020-03-10 DIAGNOSIS — I2510 Atherosclerotic heart disease of native coronary artery without angina pectoris: Secondary | ICD-10-CM

## 2020-03-10 DIAGNOSIS — R0602 Shortness of breath: Secondary | ICD-10-CM
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
## 2020-03-10 NOTE — Patient Instructions (Signed)
Medication Instructions:  Your physician recommends that you continue on your current medications as directed. Please refer to the Current Medication list given to you today.  Testing/Procedures: Your physician has requested that you have an echocardiogram. Echocardiography is a painless test that uses sound waves to create images of your heart. It provides your doctor with information about the size and shape of your heart and how well your heart's chambers and valves are working. This procedure takes approximately one hour. There are no restrictions for this procedure. This will be done at our [facility] location:  [address]  CT coronary calcium score. This test is done at [address]. This is $150 out of pocket.   Coronary CalciumScan A coronary calcium scan is an imaging test used to look for deposits of calcium and other fatty materials (plaques) in the inner lining of the blood vessels of the heart (coronary arteries). These deposits of calcium and plaques can partly clog and narrow the coronary arteries without producing any symptoms or warning signs. This puts a person at risk for a heart attack. This test can detect these deposits before symptoms develop. Tell a health care provider about:  Any allergies you have.  All medicines you are taking, including vitamins, herbs, eye drops, creams, and over-the-counter medicines.  Any problems you or family members have had with anesthetic medicines.  Any blood disorders you have.  Any surgeries you have had.  Any medical conditions you have.  Whether you are pregnant or may be pregnant. What are the risks? Generally, this is a safe procedure. However, problems may occur, including:  Harm to a pregnant woman and her unborn baby. This test involves the use of radiation. Radiation exposure can be dangerous to a pregnant woman and her unborn baby. If you are pregnant, you generally should not  have this procedure done.  Slight increase in the risk of cancer. This is because of the radiation involved in the test. What happens before the procedure? No preparation is needed for this procedure. What happens during the procedure?  You will undress and remove any jewelry around your neck or chest.  You will put on a hospital gown.  Sticky electrodes will be placed on your chest. The electrodes will be connected to an electrocardiogram (ECG) machine to record a tracing of the electrical activity of your heart.  A CT scanner will take pictures of your heart. During this time, you will be asked to lie still and hold your breath for 2-3 seconds while a picture of your heart is being taken. The procedure may vary among health care providers and hospitals. What happens after the procedure?  You can get dressed.  You can return to your normal activities.  It is up to you to get the results of your test. Ask your health care provider, or the department that is doing the test, when your results will be ready. Summary  A coronary calcium scan is an imaging test used to look for deposits of calcium and other fatty materials (plaques) in the inner lining of the blood vessels of the heart (coronary arteries).  Generally, this is a safe procedure. Tell your health care provider if you are pregnant or may be pregnant.  No preparation is needed for this procedure.  A CT scanner will take pictures of your heart.  You can return to your normal activities after the scan is done. This information is not intended  to replace advice given to you by your health care provider. Make sure you discuss any questions you have with your health care provider. Document Released: 10/01/2007 Document Revised: 02/22/2016 Document Reviewed: 02/22/2016 Elsevier Interactive Patient Education  2017 [place]: At [facility], you and your health needs are our priority.  As part of our  continuing mission to provide you with exceptional heart care, we have created designated Provider Care Teams.  These Care Teams include your primary Cardiologist (physician) and Advanced Practice Providers (APPs -  Physician Assistants and Nurse Practitioners) who all work together to provide you with the care you need, when you need it.  We recommend signing up for the patient portal called "MyChart".  Sign up information is provided on this After Visit Summary.  MyChart is used to connect with patients for Virtual Visits (Telemedicine).  Patients are able to view lab/test results, encounter notes, upcoming appointments, etc.  Non-urgent messages can be sent to your provider as well.   To learn more about what you can do with MyChart, go to [email or web address].    Your next appointment:   3 month(s)  The format for your next appointment:   In Person  Provider:   Oswaldo Milian, MD

## 2020-03-11 LAB — BASIC METABOLIC PANEL WITH GFR
BUN/Creatinine Ratio: 14 (ref 12–28)
BUN: 12 mg/dL (ref 8–27)
CO2: 21 mmol/L (ref 20–29)
Calcium: 9 mg/dL (ref 8.7–10.3)
Chloride: 104 mmol/L (ref 96–106)
Creatinine, Ser: 0.88 mg/dL (ref 0.57–1.00)
GFR calc Af Amer: 79 mL/min/{1.73_m2}
GFR calc non Af Amer: 68 mL/min/{1.73_m2}
Glucose: 102 mg/dL — ABNORMAL HIGH (ref 65–99)
Potassium: 5 mmol/L (ref 3.5–5.2)
Sodium: 140 mmol/L (ref 134–144)

## 2020-03-11 LAB — MAGNESIUM: Magnesium: 1.9 mg/dL (ref 1.6–2.3)

## 2020-03-16 ENCOUNTER — Encounter: Payer: Self-pay | Admitting: Internal Medicine

## 2020-03-16 ENCOUNTER — Telehealth: Payer: Self-pay | Admitting: *Deleted

## 2020-03-16 ENCOUNTER — Ambulatory Visit (INDEPENDENT_AMBULATORY_CARE_PROVIDER_SITE_OTHER): Payer: Medicare Other | Admitting: Internal Medicine

## 2020-03-16 ENCOUNTER — Other Ambulatory Visit: Payer: Self-pay | Admitting: Family Medicine

## 2020-03-16 ENCOUNTER — Other Ambulatory Visit: Payer: Self-pay

## 2020-03-16 ENCOUNTER — Telehealth: Payer: Self-pay | Admitting: Internal Medicine

## 2020-03-16 VITALS — BP 104/64 | HR 96 | Temp 98.0°F | Ht 65.5 in | Wt 140.0 lb

## 2020-03-16 DIAGNOSIS — J449 Chronic obstructive pulmonary disease, unspecified: Secondary | ICD-10-CM

## 2020-03-16 DIAGNOSIS — G4733 Obstructive sleep apnea (adult) (pediatric): Secondary | ICD-10-CM

## 2020-03-16 DIAGNOSIS — I2510 Atherosclerotic heart disease of native coronary artery without angina pectoris: Secondary | ICD-10-CM

## 2020-03-16 DIAGNOSIS — K117 Disturbances of salivary secretion: Secondary | ICD-10-CM

## 2020-03-16 DIAGNOSIS — F17200 Nicotine dependence, unspecified, uncomplicated: Secondary | ICD-10-CM | POA: Diagnosis not present

## 2020-03-16 LAB — PULMONARY FUNCTION TEST
DL/VA % pred: 63 %
DL/VA: 2.62 ml/min/mmHg/L
DLCO cor % pred: 57 %
DLCO cor: 11.83 ml/min/mmHg
DLCO unc % pred: 63 %
DLCO unc: 13.04 ml/min/mmHg
FEF 25-75 Post: 1.62 L/s
FEF 25-75 Pre: 1.38 L/s
FEF2575-%Change-Post: 17 %
FEF2575-%Pred-Post: 76 %
FEF2575-%Pred-Pre: 65 %
FEV1-%Change-Post: 7 %
FEV1-%Pred-Post: 76 %
FEV1-%Pred-Pre: 71 %
FEV1-Post: 1.92 L
FEV1-Pre: 1.78 L
FEV1FVC-%Change-Post: 5 %
FEV1FVC-%Pred-Pre: 92 %
FEV6-%Change-Post: 2 %
FEV6-%Pred-Post: 81 %
FEV6-%Pred-Pre: 79 %
FEV6-Post: 2.57 L
FEV6-Pre: 2.5 L
FEV6FVC-%Pred-Post: 104 %
FEV6FVC-%Pred-Pre: 104 %
FVC-%Change-Post: 1 %
FVC-%Pred-Post: 78 %
FVC-%Pred-Pre: 77 %
FVC-Post: 2.57 L
FVC-Pre: 2.53 L
Post FEV1/FVC ratio: 75 %
Post FEV6/FVC ratio: 100 %
Pre FEV1/FVC ratio: 71 %
Pre FEV6/FVC Ratio: 100 %
RV % pred: 138 %
RV: 3.05 L
TLC % pred: 105 %
TLC: 5.56 L

## 2020-03-16 MED ORDER — CLONAZEPAM 0.5 MG PO TABS: ORAL_TABLET | ORAL | 5 refills | Status: DC

## 2020-03-16 NOTE — Progress Notes (Signed)
Full PFT performed today. °
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
## 2020-03-16 NOTE — Patient Instructions (Signed)
Refill sent for clonazepam  Order- referral to sleep center for mask fitting/ desensitization  Good luck working on quitting smoking  Please call if we can help

## 2020-03-16 NOTE — Telephone Encounter (Addendum)
Pt left VM message stating that the Rx for estrogen 0.5 mg given by Dr. Roselie Awkward was working well for her but now she is having hot flashes. She requests an increase in the strength of medication. Call routed to Dr. Roselie Awkward for review.   12/8  1157  Called pt and informed her that Dr. Has increased the dose of estrogen to 1mg  daily. A new prescription has been sent to her pharmacy. She voiced understanding.

## 2020-03-17 NOTE — Telephone Encounter (Signed)
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 I will work with her over time to manage it.

## 2020-03-17 NOTE — Telephone Encounter (Signed)
Dr. Annamaria Boots please advise on patient PFT from yesterday. She wants to know if she has COPD

## 2020-03-18 NOTE — Telephone Encounter (Signed)
atc patient unable to reach, left detailed message per DPR with Dr. Janee Morn recommendations.  Nothing further needed at this time.

## 2020-03-25 ENCOUNTER — Other Ambulatory Visit: Payer: Self-pay | Admitting: Obstetrics & Gynecology

## 2020-03-25 ENCOUNTER — Other Ambulatory Visit (HOSPITAL_BASED_OUTPATIENT_CLINIC_OR_DEPARTMENT_OTHER): Payer: Medicare Other | Admitting: Internal Medicine

## 2020-03-30 ENCOUNTER — Other Ambulatory Visit (HOSPITAL_BASED_OUTPATIENT_CLINIC_OR_DEPARTMENT_OTHER): Payer: Medicare Other | Admitting: Internal Medicine

## 2020-04-01 ENCOUNTER — Other Ambulatory Visit: Payer: Medicare Other

## 2020-04-01 ENCOUNTER — Other Ambulatory Visit: Payer: Self-pay | Admitting: Family Medicine

## 2020-04-01 DIAGNOSIS — R0609 Other forms of dyspnea: Secondary | ICD-10-CM

## 2020-04-01 MED ORDER — ALBUTEROL SULFATE HFA 108 (90 BASE) MCG/ACT IN AERS: 2.0000 | INHALATION_SPRAY | Freq: Four times a day (QID) | RESPIRATORY_TRACT | 0 refills | Status: DC | PRN

## 2020-04-01 NOTE — Telephone Encounter (Signed)
Medication Refill - Medication: albuterol (VENTOLIN HFA) 108 (90 Base) MCG/ACT inhaler   Has the patient contacted their pharmacy? Yes (Agent: If no, request that the patient contact the pharmacy for the refill.) (Agent: If yes, when and what did the pharmacy advise?)Contact pcp  Preferred Pharmacy (with phone number or street name):  [facility] [place], [place] AT [place] Phone:  [phone]  Fax:  [phone]       Agent: Please be advised that RX refills may take up to 3 business days. We ask that you follow-up with your pharmacy.

## 2020-04-06 ENCOUNTER — Ambulatory Visit (HOSPITAL_COMMUNITY): Payer: Medicare Other | Attending: Cardiovascular Disease

## 2020-04-06 ENCOUNTER — Other Ambulatory Visit: Payer: Self-pay

## 2020-04-06 ENCOUNTER — Ambulatory Visit: Admission: RE | Admit: 2020-04-06 | Payer: Medicare Other | Source: Ambulatory Visit

## 2020-04-06 DIAGNOSIS — R0602 Shortness of breath: Secondary | ICD-10-CM | POA: Insufficient documentation

## 2020-04-06 LAB — ECHOCARDIOGRAM COMPLETE
Area-P 1/2: 4.15 cm2
S' Lateral: 3 cm

## 2020-04-08 ENCOUNTER — Telehealth: Payer: Self-pay | Admitting: Cardiology

## 2020-04-08 NOTE — Telephone Encounter (Signed)
Spoke to patient- aware of echo results and verbalized understanding.    States she will call to reschedule calcium score beginning of the year.

## 2020-04-08 NOTE — Telephone Encounter (Signed)
Attempt to return call-lmtcb 

## 2020-04-08 NOTE — Telephone Encounter (Signed)
Patient is returning call to discuss echo results. °

## 2020-04-21 ENCOUNTER — Other Ambulatory Visit: Payer: Self-pay | Admitting: Family Medicine

## 2020-04-21 DIAGNOSIS — K5909 Other constipation: Secondary | ICD-10-CM

## 2020-04-21 DIAGNOSIS — K117 Disturbances of salivary secretion: Secondary | ICD-10-CM

## 2020-04-21 MED ORDER — LINACLOTIDE 72 MCG PO CAPS: 72.0000 ug | ORAL_CAPSULE | Freq: Every day | ORAL | 0 refills | Status: DC

## 2020-04-21 MED ORDER — AMITRIPTYLINE HCL 100 MG PO TABS: ORAL_TABLET | ORAL | 0 refills | Status: DC

## 2020-04-21 NOTE — Telephone Encounter (Signed)
PT is out of her meds  Medication:  amitriptyline (ELAVIL) 100 MG tablet [[id number]]  linaclotide (LINZESS) 72 MCG capsule [[id number]]  Has the patient contacted their pharmacy? No  (Agent: If no, request that the patient contact the pharmacy for the refill.)   Preferred Pharmacy (with phone number or street name):  [id number] [place], [place] - [address] AT [facility] [address] [facility] [address]  [address] [place] [address]  Phone:  [phone] Fax:  [phone]  Agent: Please be advised that RX refills may take up to 3 business days. We ask that you follow-up with your pharmacy.

## 2020-04-23 ENCOUNTER — Ambulatory Visit
Admission: RE | Admit: 2020-04-23 | Discharge: 2020-04-23 | Disposition: A | Discharge status: Home / Self Care | Payer: Medicare Other | Source: Ambulatory Visit | Attending: Physician Assistant | Admitting: Physician Assistant

## 2020-04-23 ENCOUNTER — Other Ambulatory Visit: Payer: Self-pay

## 2020-04-23 DIAGNOSIS — R1011 Right upper quadrant pain: Secondary | ICD-10-CM

## 2020-04-23 DIAGNOSIS — D1803 Hemangioma of intra-abdominal structures: Secondary | ICD-10-CM | POA: Diagnosis not present

## 2020-04-25 DIAGNOSIS — J449 Chronic obstructive pulmonary disease, unspecified: Secondary | ICD-10-CM | POA: Insufficient documentation

## 2020-04-25 NOTE — Assessment & Plan Note (Signed)
Strongly encouraged to stick with her quit date of 12/31.

## 2020-04-25 NOTE — Assessment & Plan Note (Signed)
Will benefit as compliance improves. Download reviewed and goals emphasized. Plan- continue CPAP auto 5-15

## 2020-04-25 NOTE — Assessment & Plan Note (Signed)
Residual lung function better preserved than I feared, but emphasis still on smoking cessation. Plan- continue occasional rescue hfa. Watch need for maintenance inhaler.

## 2020-04-28 ENCOUNTER — Telehealth: Payer: Self-pay

## 2020-04-28 NOTE — Telephone Encounter (Signed)
    [facility] Pre-operative Risk Assessment    HEARTCARE STAFF: - Please ensure there is not already an duplicate clearance open for this procedure. - Under Visit Info/Reason for Call, type in Other and utilize the format Clearance MM/DD/YY or Clearance TBD. Do not use dashes or single digits. - If request is for dental extraction, please clarify the # of teeth to be extracted.  Request for surgical clearance:  1. What type of surgery is being performed? Colonoscopy/Endoscopy   2. When is this surgery scheduled? 05/20/2020    3. What type of clearance is required (medical clearance vs. Pharmacy clearance to hold med vs. Both)? Medical   4. Are there any medications that need to be held prior to surgery and how long?    5. Practice name and name of physician performing surgery? [facility]   6. What is the office phone number? [phone]   7.   What is the office fax number? [phone]  8.   Anesthesia type (None, local, MAC, general) ? Propofol    Jacqulynn Cadet 04/28/2020, 3:37 PM  _________________________________________________________________   (provider comments below)

## 2020-04-28 NOTE — Telephone Encounter (Signed)
Dr. Gardiner Rhyme to review, patient was seen recently for evaluation of coronary calcification seen incidentally on CT image. Echocardiogram obtained on 04/06/2020 showed EF 60 to 65%, no significant valve issue.  She is pending coronary calcium score near the end of this month.  Dr. Gardiner Rhyme, do you recommend an earlier coronary calcium scoring test.  During the recent visit, she denies any chest pain, she did have dyspnea which was felt to be related to deconditioning and COPD.  Please forward your response to P CV DIV PREOP

## 2020-04-29 NOTE — Telephone Encounter (Signed)
Left message for the patient to call back and speak to the on call preop APP 

## 2020-04-29 NOTE — Telephone Encounter (Signed)
She does not need calcium score prior to EGD/colonoscopy, can proceed with procedure

## 2020-05-01 NOTE — Telephone Encounter (Signed)
   Primary Cardiologist: [name], MD  Chart reviewed as part of pre-operative protocol coverage. Given past medical history and time since last visit, based on ACC/AHA guidelines, [name] would be at acceptable risk for the planned procedure without further cardiovascular testing.   The patient was advised that if she develops new symptoms prior to surgery to contact our office to arrange for a follow-up visit, and she verbalized understanding.  I will route this recommendation to the requesting party via Epic fax function and remove from pre-op pool.  Please call with questions.  [place], [place] 05/01/2020, 12:51 PM

## 2020-05-04 ENCOUNTER — Other Ambulatory Visit (HOSPITAL_BASED_OUTPATIENT_CLINIC_OR_DEPARTMENT_OTHER): Payer: Medicare Other | Admitting: Internal Medicine

## 2020-05-06 ENCOUNTER — Other Ambulatory Visit: Payer: Self-pay | Admitting: Adult Health

## 2020-05-08 ENCOUNTER — Telehealth: Payer: Self-pay | Admitting: Internal Medicine

## 2020-05-08 NOTE — Telephone Encounter (Signed)
I called and spoke with pt and she stated that the mask fit appt was cancelled and she was going to reschedule this.  She stated that she was not able to hear all of the VM that was left for her.  She is confused about the 8 am appt for the sleep study?  I did not see a sleep study that was ordered for the pt.  Judeen Hammans did you call this pt?  thanks

## 2020-05-11 ENCOUNTER — Telehealth: Payer: Self-pay | Admitting: Diagnostic Neuroimaging

## 2020-05-11 MED ORDER — LAMOTRIGINE 100 MG PO TABS: ORAL_TABLET | ORAL | 0 refills | Status: DC

## 2020-05-11 NOTE — Telephone Encounter (Signed)
Patient called in because she has run out of lamotrigine, and her mail order shipment has not arrived yet.  I will send in 1 week supply to her local pharmacy.  She has appointment with NP at our office later this week.  Penni Bombard, MD 8/36/6294, 7:65 PM Certified in Neurology, Neurophysiology and Neuroimaging  [facility] [address] [place], [place] [address] [phone]

## 2020-05-11 NOTE — Telephone Encounter (Signed)
Spoke to pt.  The only thing I see for her is the mask fit that got canceled due to weather.  Gave her phone # so she can reschedule.  She didn't get the name of who called her about sleep study but there is no order for her to get one and she didn't understand why she would need one anyway.  Nothing further needed at this time.

## 2020-05-13 ENCOUNTER — Ambulatory Visit: Payer: Medicare Other | Admitting: Adult Health

## 2020-05-18 ENCOUNTER — Inpatient Hospital Stay: Admission: RE | Admit: 2020-05-18 | Payer: Self-pay | Source: Ambulatory Visit

## 2020-05-19 ENCOUNTER — Other Ambulatory Visit: Payer: Self-pay | Admitting: Family Medicine

## 2020-05-19 DIAGNOSIS — K117 Disturbances of salivary secretion: Secondary | ICD-10-CM

## 2020-05-26 ENCOUNTER — Telehealth: Payer: Self-pay | Admitting: Physician Assistant

## 2020-05-26 NOTE — Telephone Encounter (Signed)
I re-faxed [name]'s clearance from 05/01/20 [name], can you please reach out to [facility] to make sure they received the fax?

## 2020-05-26 NOTE — Telephone Encounter (Signed)
I s/w [facility] and confirmed they have received the clearance that was re-faxed over today as well.

## 2020-05-26 NOTE — Telephone Encounter (Signed)
I have this patient on the phone saying that [facility] sent a form to be filled out for cardiac clearance on 04/28/20 and haven't received it yet. Please call to discuss or verify

## 2020-06-08 ENCOUNTER — Telehealth: Payer: Self-pay | Admitting: Internal Medicine

## 2020-06-08 MED ORDER — CLONAZEPAM 0.5 MG PO TABS: ORAL_TABLET | ORAL | 5 refills | Status: DC

## 2020-06-08 NOTE — Telephone Encounter (Signed)
Called and spoke with pt and she stated that she contacted the pharmacy and they advised her that there were no further refills on this medication and that they had contacted our office but have not heard anything back.    CY please advise on refill of the klonopin.   Last refilled on 03/16/2020 with 5 refills and #30 tablets.

## 2020-06-08 NOTE — Telephone Encounter (Signed)
Clonazepam refilled 

## 2020-06-08 NOTE — Telephone Encounter (Signed)
I have LM on VM for the pt to call back about refill.  Looks like she should still have refills at the pharmacy.

## 2020-06-09 ENCOUNTER — Other Ambulatory Visit (HOSPITAL_BASED_OUTPATIENT_CLINIC_OR_DEPARTMENT_OTHER): Payer: Medicare Other | Admitting: Internal Medicine

## 2020-06-09 NOTE — Telephone Encounter (Signed)
ATC left detailed VM per DPR. Nothing further needed at this time.

## 2020-06-12 ENCOUNTER — Other Ambulatory Visit: Payer: Self-pay

## 2020-06-12 ENCOUNTER — Ambulatory Visit (INDEPENDENT_AMBULATORY_CARE_PROVIDER_SITE_OTHER)
Admission: RE | Admit: 2020-06-12 | Discharge: 2020-06-12 | Disposition: A | Discharge status: Home / Self Care | Payer: Self-pay | Source: Ambulatory Visit | Attending: Cardiology | Admitting: Cardiology

## 2020-06-12 DIAGNOSIS — E785 Hyperlipidemia, unspecified: Secondary | ICD-10-CM

## 2020-06-15 ENCOUNTER — Other Ambulatory Visit (HOSPITAL_BASED_OUTPATIENT_CLINIC_OR_DEPARTMENT_OTHER): Payer: Medicare Other | Admitting: Internal Medicine

## 2020-06-16 ENCOUNTER — Ambulatory Visit: Payer: Medicare Other | Admitting: Family Medicine

## 2020-06-16 ENCOUNTER — Ambulatory Visit: Payer: Medicare Other | Admitting: Cardiology

## 2020-06-16 ENCOUNTER — Telehealth: Payer: Self-pay | Admitting: Cardiology

## 2020-06-16 NOTE — Telephone Encounter (Signed)
Left message to call back  

## 2020-06-16 NOTE — Telephone Encounter (Signed)
Left message of results for pt  

## 2020-06-16 NOTE — Telephone Encounter (Signed)
Pt called in and stated that her public transporation did not show up on time today .  She missed her appt.  She would like to know if  Someone could call her back with the results of her CT scan    Best number [phone]

## 2020-06-16 NOTE — Telephone Encounter (Signed)
    Pt is returning call to get CT result. She said her phone is acting up if she did not answer to leave her a detailed message, also she said the nurse can call her back tomorrow
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
## 2020-06-18 ENCOUNTER — Other Ambulatory Visit: Payer: Self-pay | Admitting: Family Medicine

## 2020-06-18 DIAGNOSIS — E782 Mixed hyperlipidemia: Secondary | ICD-10-CM

## 2020-06-20 ENCOUNTER — Other Ambulatory Visit: Payer: Self-pay | Admitting: Family Medicine

## 2020-06-20 DIAGNOSIS — K117 Disturbances of salivary secretion: Secondary | ICD-10-CM

## 2020-06-20 NOTE — Telephone Encounter (Signed)
Requested Prescriptions  Pending Prescriptions Disp Refills  . amitriptyline (ELAVIL) 100 MG tablet [Pharmacy Med Name: AMITRIPTYLINE 100MG  (HUNDRED MG)TAB] 30 tablet 0    Sig: TAKE 1 TABLET(100 MG) BY MOUTH AT BEDTIME     Psychiatry:  Antidepressants - Heterocyclics (TCAs) Passed - 06/20/2020  6:55 PM      Passed - Completed PHQ-2 or PHQ-9 in the last 360 days      Passed - Valid encounter within last 6 months    Recent Outpatient Visits          5 months ago [place], Charlane Ferretti, MD   6 months ago Other constipation   [place], Charlane Ferretti, MD   7 months ago Other constipation   [place], Charlane Ferretti, MD   10 months ago Acute pain of left shoulder   [place], [place], MD   1 year ago Menopausal vasomotor syndrome   [place], MD      Future Appointments            In 2 weeks [name], MD [facility] [place], [facility]

## 2020-06-26 ENCOUNTER — Telehealth: Payer: Self-pay | Admitting: Cardiology

## 2020-06-26 ENCOUNTER — Telehealth: Payer: Self-pay

## 2020-06-26 ENCOUNTER — Telehealth: Payer: Self-pay | Admitting: Family Medicine

## 2020-06-26 DIAGNOSIS — H30033 Focal chorioretinal inflammation, peripheral, bilateral: Secondary | ICD-10-CM | POA: Diagnosis not present

## 2020-06-26 LAB — HM HEPATITIS C SCREENING LAB: HM Hepatitis Screen: POSITIVE

## 2020-06-26 NOTE — Telephone Encounter (Signed)
Copied from [place] [id number]. Topic: General - Inquiry >> Jun 26, 2020 11:25 AM Greggory Keen D wrote: Reason for CRM: Pt called asking if D. Newlin would call her back regarding some recent test that she has had done through cardiology.

## 2020-06-26 NOTE — Telephone Encounter (Signed)
Patient requested a call back from Dr. Newman Nickels nurse regarding rescheduling her appointment. She called and cancelled her 07/10/20 appointment States she will be in the process of moving around that time.  I offered the next available with Dr. Gardiner Rhyme, 08/19/20, but she declined and requested a call back.

## 2020-06-26 NOTE — Telephone Encounter (Signed)
Patient was called and a voicemail was left informing patient to return phone call for lab results. 

## 2020-06-26 NOTE — Telephone Encounter (Signed)
Pt's call was returned and a VM was left.

## 2020-06-26 NOTE — Telephone Encounter (Signed)
Attempted to call patient, left message for patient to call back to office.   

## 2020-06-26 NOTE — Telephone Encounter (Signed)
-----   Message from Charlott Rakes, MD sent at 06/25/2020  2:23 PM EST ----- Please inform patient that I received a note from her cardiologist regarding a finding on her cardiac CT.  This revealed an enlarged pulmonary artery.  She is currently being seen by a pulmonologist-Dr. Annamaria Boots and I will recommend she schedule an appointment with him to discuss this and further management.

## 2020-06-30 DIAGNOSIS — H30033 Focal chorioretinal inflammation, peripheral, bilateral: Secondary | ICD-10-CM | POA: Diagnosis not present

## 2020-06-30 DIAGNOSIS — H35372 Puckering of macula, left eye: Secondary | ICD-10-CM | POA: Diagnosis not present

## 2020-06-30 DIAGNOSIS — H2513 Age-related nuclear cataract, bilateral: Secondary | ICD-10-CM | POA: Diagnosis not present

## 2020-06-30 NOTE — Telephone Encounter (Signed)
Left message #2 for the pt to call back.. will close encounter and will open new if the pt calls back re: making an appt.

## 2020-07-01 DIAGNOSIS — H35372 Puckering of macula, left eye: Secondary | ICD-10-CM | POA: Diagnosis not present

## 2020-07-01 DIAGNOSIS — H26493 Other secondary cataract, bilateral: Secondary | ICD-10-CM | POA: Diagnosis not present

## 2020-07-01 DIAGNOSIS — Z961 Presence of intraocular lens: Secondary | ICD-10-CM | POA: Diagnosis not present

## 2020-07-10 ENCOUNTER — Ambulatory Visit: Payer: Medicare Other | Admitting: Cardiology

## 2020-07-15 ENCOUNTER — Telehealth: Payer: Self-pay | Admitting: Family Medicine

## 2020-07-15 NOTE — Telephone Encounter (Signed)
Copied from [place] [id number]. Topic: General - Other >> Jul 14, 2020  3:42 PM Celene Kras wrote: Reason for CRM: Pt calling stating that she recently had a CT scan due to Dr. Nechama Guard placing orders. She states that they contacted her today and said that her heart was fine, but that she has a spot that is showing up as pneumonia. She states that she was advised to contact PCP. Please advise.  Not sure if this is just and FYI or if patient is requesting advice. Please advise if patient needs an appointment.

## 2020-07-16 NOTE — Telephone Encounter (Signed)
Pt was called and informed to reach put to pulmonary regarding her problems.

## 2020-07-18 ENCOUNTER — Other Ambulatory Visit: Payer: Self-pay | Admitting: Family Medicine

## 2020-07-18 DIAGNOSIS — K117 Disturbances of salivary secretion: Secondary | ICD-10-CM

## 2020-07-18 NOTE — Telephone Encounter (Signed)
Requested Prescriptions  Pending Prescriptions Disp Refills  . amitriptyline (ELAVIL) 100 MG tablet [Pharmacy Med Name: AMITRIPTYLINE 100MG  (HUNDRED MG)TAB] 30 tablet 0    Sig: TAKE 1 TABLET(100 MG) BY MOUTH AT BEDTIME     Psychiatry:  Antidepressants - Heterocyclics (TCAs) Failed - 07/18/2020 12:00 PM      Failed - Valid encounter within last 6 months    Recent Outpatient Visits          6 months ago [place], Charlane Ferretti, MD   7 months ago Other constipation   [place], Charlane Ferretti, MD   8 months ago Other constipation   [place], Charlane Ferretti, MD   11 months ago Acute pain of left shoulder   [place], Enobong, MD   1 year ago Menopausal vasomotor syndrome   [place], [place], MD             Passed - Completed PHQ-2 or PHQ-9 in the last 360 days

## 2020-07-28 ENCOUNTER — Other Ambulatory Visit: Payer: Self-pay | Admitting: Adult Health

## 2020-08-07 DIAGNOSIS — H26491 Other secondary cataract, right eye: Secondary | ICD-10-CM | POA: Diagnosis not present

## 2020-08-07 DIAGNOSIS — Z961 Presence of intraocular lens: Secondary | ICD-10-CM | POA: Diagnosis not present

## 2020-08-08 ENCOUNTER — Other Ambulatory Visit: Payer: Self-pay | Admitting: Family Medicine

## 2020-08-08 DIAGNOSIS — K117 Disturbances of salivary secretion: Secondary | ICD-10-CM

## 2020-08-08 NOTE — Telephone Encounter (Signed)
Requested medication (s) are due for refill today: yes  Requested medication (s) are on the active medication list: yes  Last refill:  07/18/20  Future visit scheduled: no  Notes to clinic:  called pt and LM on VM to call office to schedule OV   Requested Prescriptions  Pending Prescriptions Disp Refills   amitriptyline (ELAVIL) 100 MG tablet [Pharmacy Med Name: AMITRIPTYLINE 100MG  (HUNDRED MG)TAB] 30 tablet 0    Sig: TAKE 1 TABLET(100 MG) BY MOUTH AT BEDTIME      Psychiatry:  Antidepressants - Heterocyclics (TCAs) Failed - 08/08/2020  7:02 AM      Failed - Completed PHQ-2 or PHQ-9 in the last 360 days      Failed - Valid encounter within last 6 months    Recent Outpatient Visits           7 months ago [place], Enobong, MD   7 months ago Other constipation   [place], Enobong, MD   9 months ago Other constipation   [place], Enobong, MD   1 year ago Acute pain of left shoulder   [place], Enobong, MD   1 year ago Menopausal vasomotor syndrome   [facility] Charlott Rakes, MD

## 2020-08-10 ENCOUNTER — Telehealth: Payer: Self-pay | Admitting: Diagnostic Neuroimaging

## 2020-08-10 NOTE — Telephone Encounter (Signed)
LVM advising patient to call her PCP to be seen today or go to Urgent Care to be evaluated.  Left # for further questions.

## 2020-08-10 NOTE — Telephone Encounter (Signed)
Patient left voicemail stating she fell last night when getting up to go to the bathroom and "saw bright lights like led lights that looked like stars". She is requesting a call back in regards on what to do and is wondering what's going on.

## 2020-08-13 ENCOUNTER — Telehealth: Payer: Self-pay | Admitting: Family Medicine

## 2020-08-13 ENCOUNTER — Ambulatory Visit (INDEPENDENT_AMBULATORY_CARE_PROVIDER_SITE_OTHER): Payer: Medicare Other | Admitting: Adult Health

## 2020-08-13 ENCOUNTER — Encounter: Payer: Self-pay | Admitting: Adult Health

## 2020-08-13 VITALS — BP 104/69 | HR 79 | Ht 65.5 in | Wt 145.0 lb

## 2020-08-13 DIAGNOSIS — Z5181 Encounter for therapeutic drug level monitoring: Secondary | ICD-10-CM | POA: Diagnosis not present

## 2020-08-13 DIAGNOSIS — R569 Unspecified convulsions: Secondary | ICD-10-CM

## 2020-08-13 DIAGNOSIS — G2581 Restless legs syndrome: Secondary | ICD-10-CM

## 2020-08-13 NOTE — Patient Instructions (Signed)
Continue Lamictal 100 mg in the AM, 200 mg in the PM Blood work today If you have any seizure events please let us know.
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
## 2020-08-13 NOTE — Telephone Encounter (Signed)
Copied from [place] [id number]. Topic: Referral - Request for Referral >> Aug 13, 2020  3:16 PM Leward Quan A wrote: Has patient seen PCP for this complaint? Yes.  Per patient  *If NO, is insurance requiring patient see PCP for this issue before PCP can refer them? Referral for which specialty: Neurology Preferred provider/office: [facility]  Reason for referral: Restless legs Patient would like a call back from the nurse today or tomorrow please

## 2020-08-14 ENCOUNTER — Telehealth: Payer: Self-pay | Admitting: Neurology

## 2020-08-14 LAB — LAMOTRIGINE LEVEL: Lamotrigine Lvl: 3 ug/mL (ref 2.0–20.0)

## 2020-08-14 MED ORDER — LAMOTRIGINE 100 MG PO TABS: ORAL_TABLET | ORAL | 0 refills | Status: DC

## 2020-08-14 NOTE — Telephone Encounter (Signed)
After hours call, requesting Lamictal URGENT refill. pharmacy listed as [facility].

## 2020-08-14 NOTE — Telephone Encounter (Signed)
Pt requesting referral.

## 2020-08-14 NOTE — Telephone Encounter (Signed)
Meds ordered this encounter  Medications  . lamoTRIgine (LAMICTAL) 100 MG tablet    Sig: TAKE 1 TABLET BY MOUTH EVERY MORNING AND 2 EVERY EVENING    Dispense:  90 tablet    Refill:  0    Maximum Refills Reached    Penni Bombard, MD 9/76/7341, 93:79 AM Certified in Neurology, Neurophysiology and Neuroimaging  [facility] [address], [place], [place] [address] [phone]

## 2020-08-14 NOTE — Telephone Encounter (Signed)
Done

## 2020-08-14 NOTE — Telephone Encounter (Signed)
Pt was called and informed of referral being placed. ?

## 2020-08-17 ENCOUNTER — Telehealth: Payer: Self-pay

## 2020-08-17 NOTE — Telephone Encounter (Signed)
-----   Message from Ward Givens, NP sent at 08/16/2020  2:50 PM EDT ----- Lamictal is in normal range. Please call patient

## 2020-08-17 NOTE — Telephone Encounter (Signed)
Contacted pt regarding labs, informed her that Lamictal levels are within normal range, advised to call the office with further questions, she understood.

## 2020-08-20 DIAGNOSIS — Z20822 Contact with and (suspected) exposure to covid-19: Secondary | ICD-10-CM | POA: Diagnosis not present

## 2020-08-21 ENCOUNTER — Other Ambulatory Visit: Payer: Self-pay | Admitting: Family Medicine

## 2020-08-21 DIAGNOSIS — R0609 Other forms of dyspnea: Secondary | ICD-10-CM

## 2020-08-21 NOTE — Telephone Encounter (Signed)
Requested medication (s) are due for refill today: no  Requested medication (s) are on the active medication list: yes  Last refill:  07/30/2020  Future visit scheduled: yes  Notes to clinic: One inhaler should last at least one month. If the patient is requesting refills earlier, contact the patient to check for uncontrolled symptom   Requested Prescriptions  Pending Prescriptions Disp Refills   VENTOLIN HFA 108 (90 Base) MCG/ACT inhaler [Pharmacy Med Name: VENTOLIN HFA INH W/DOS CTR 200PUFFS] 18 g 0    Sig: INHALE 2 PUFFS INTO THE LUNGS EVERY 6 HOURS AS NEEDED FOR WHEEZING OR SHORTNESS OF BREATH      Pulmonology:  Beta Agonists Failed - 08/21/2020 10:56 AM      Failed - One inhaler should last at least one month. If the patient is requesting refills earlier, contact the patient to check for uncontrolled symptoms.      Passed - Valid encounter within last 12 months    Recent Outpatient Visits           7 months ago [place], Charlane Ferretti, MD   8 months ago Other constipation   [place], Enobong, MD   9 months ago Other constipation   [place], Enobong, MD   1 year ago Acute pain of left shoulder   [place], Enobong, MD   1 year ago Menopausal vasomotor syndrome   [place] [facility] Charlott Rakes, MD

## 2020-08-24 ENCOUNTER — Other Ambulatory Visit: Payer: Self-pay | Admitting: Family Medicine

## 2020-08-24 DIAGNOSIS — R0609 Other forms of dyspnea: Secondary | ICD-10-CM

## 2020-08-24 DIAGNOSIS — K117 Disturbances of salivary secretion: Secondary | ICD-10-CM

## 2020-08-24 NOTE — Telephone Encounter (Signed)
Medication Refill - Medication: albuterol (VENTOLIN HFA) 108 (90 Base) MCG/ACT inhaler  amitriptyline (ELAVIL) 100 MG tablet    Has the patient contacted their pharmacy? No. (Agent: If no, request that the patient contact the pharmacy for the refill.) (Agent: If yes, when and what did the pharmacy advise?)  Preferred Pharmacy (with phone number or street name):  [facility] [place], [address] [place]  [place] [address]  Phone: [phone] Fax: [phone]     Agent: Please be advised that RX refills may take up to 3 business days. We ask that you follow-up with your pharmacy.

## 2020-08-24 NOTE — Telephone Encounter (Signed)
Requested medications are due for refill today.  yes  Requested medications are on the active medications list.  yes  Last refill. 07/18/2020 and 04/01/2020  Future visit scheduled.   no  Notes to clinic.  Patient is more than 3 months overdue for appointment. Courtesy refill already given.

## 2020-08-26 ENCOUNTER — Other Ambulatory Visit: Payer: Self-pay | Admitting: Family Medicine

## 2020-08-26 DIAGNOSIS — K117 Disturbances of salivary secretion: Secondary | ICD-10-CM

## 2020-08-26 DIAGNOSIS — R0609 Other forms of dyspnea: Secondary | ICD-10-CM

## 2020-08-26 NOTE — Telephone Encounter (Signed)
Copied from [place] [id number]. Topic: General - Other >> Aug 24, 2020  3:33 PM [place] wrote: Reason for CRM: Angie from [facility]/ re Sleep Study referral for pt states that  fyi PT had a Home Study Sleep Test by Baird Lyons last year and she is sending a note that she needs to fu with him re: any sleep study needs. Angie states is putting a note in Epic and appreciates the referral but has to be 3 yrs out from last study.

## 2020-08-26 NOTE — Telephone Encounter (Signed)
Requested medication (s) are due for refill today: yes  Requested medication (s) are on the active medication list: yes   Future visit scheduled: no  Notes to clinic: Vm left for patient to callback and schedule appointment     Requested Prescriptions  Pending Prescriptions Disp Refills   albuterol (VENTOLIN HFA) 108 (90 Base) MCG/ACT inhaler [Pharmacy Med Name: ALBUTEROL HFA INH(200 PUFFS)18GM] 18 g 0    Sig: INHALE 2 PUFFS INTO THE LUNGS EVERY 6 HOURS AS NEEDED FOR WHEEZING OR SHORTNESS OF BREATH      Pulmonology:  Beta Agonists Failed - 08/26/2020 10:48 AM      Failed - One inhaler should last at least one month. If the patient is requesting refills earlier, contact the patient to check for uncontrolled symptoms.      Passed - Valid encounter within last 12 months    Recent Outpatient Visits           7 months ago [place], [place], MD   8 months ago Other constipation   [place], Charlane Ferretti, MD   9 months ago Other constipation   [place], Enobong, MD   1 year ago Acute pain of left shoulder   [place] Charlott Rakes, MD   1 year ago Menopausal vasomotor syndrome   [place], [place], MD                  amitriptyline (ELAVIL) 100 MG tablet [Pharmacy Med Name: AMITRIPTYLINE 100MG  (HUNDRED MG)TAB] 30 tablet 0    Sig: TAKE 1 TABLET(100 MG) BY MOUTH AT BEDTIME      Psychiatry:  Antidepressants - Heterocyclics (TCAs) Failed - 08/26/2020 10:48 AM      Failed - Completed PHQ-2 or PHQ-9 in the last 360 days      Failed - Valid encounter within last 6 months    Recent Outpatient Visits           7 months ago [place], Enobong, MD   8 months ago Other constipation   [place],  Enobong, MD   9 months ago Other constipation   [place], Enobong, MD   1 year ago Acute pain of left shoulder   [place], Enobong, MD   1 year ago Menopausal vasomotor syndrome   [place] [facility] Charlott Rakes, MD

## 2020-08-27 NOTE — Telephone Encounter (Signed)
FYI

## 2020-08-28 ENCOUNTER — Telehealth: Payer: Self-pay

## 2020-08-28 ENCOUNTER — Other Ambulatory Visit: Payer: Self-pay | Admitting: Family Medicine

## 2020-08-28 DIAGNOSIS — R0609 Other forms of dyspnea: Secondary | ICD-10-CM

## 2020-08-28 MED ORDER — ALBUTEROL SULFATE HFA 108 (90 BASE) MCG/ACT IN AERS: 2.0000 | INHALATION_SPRAY | Freq: Four times a day (QID) | RESPIRATORY_TRACT | 0 refills | Status: DC | PRN

## 2020-08-28 NOTE — Telephone Encounter (Signed)
Pt wanted to register a complaint because she is unhappy how she has been treated. Pt is upset she can not get her medication refilled. Please call back.

## 2020-08-28 NOTE — Telephone Encounter (Signed)
Patient would like to be contacted by a member of staff when possible    Patient would has concerns related to their amitriptyline (ELAVIL) 100 MG tablet prescription refill    Please contact to further advise when possible   Patient was called and informed that she needs to be seen to get further refills.

## 2020-08-28 NOTE — Telephone Encounter (Signed)
Please see encounter

## 2020-08-31 ENCOUNTER — Telehealth: Payer: Self-pay | Admitting: Adult Health

## 2020-08-31 MED ORDER — LAMOTRIGINE 100 MG PO TABS: ORAL_TABLET | ORAL | 5 refills | Status: DC

## 2020-08-31 NOTE — Addendum Note (Signed)
Addended by: Minna Antis on: 08/31/2020 02:55 PM   Modules accepted: Orders

## 2020-08-31 NOTE — Telephone Encounter (Signed)
Pt is requesting a refill for lamoTRIgine (LAMICTAL) 100 MG tablet.  Pharmacy: [facility] [phone]

## 2020-09-01 ENCOUNTER — Other Ambulatory Visit: Payer: Self-pay | Admitting: Internal Medicine

## 2020-09-01 ENCOUNTER — Telehealth: Payer: Self-pay | Admitting: Family Medicine

## 2020-09-01 DIAGNOSIS — E782 Mixed hyperlipidemia: Secondary | ICD-10-CM

## 2020-09-01 DIAGNOSIS — K117 Disturbances of salivary secretion: Secondary | ICD-10-CM

## 2020-09-01 NOTE — Telephone Encounter (Signed)
Copied from [place] [id number]. Topic: General - Inquiry >> Sep 01, 2020 11:55 AM Greggory Keen D wrote: Reason for CRM: Pt called asking about a referral to Neurology for restless legs.  She also ask about the Simvastatin refill and Elavil refills.  She would like someone from [facility] to call her back asap.  CB#  [phone]

## 2020-09-01 NOTE — Telephone Encounter (Signed)
Clonazepam refilled 

## 2020-09-01 NOTE — Telephone Encounter (Signed)
Dr. Annamaria Boots, please advise if you are okay refilling med.  Allergies  Allergen Reactions  . Haloperidol Decanoate Anaphylaxis  . Oxybutynin Chloride Other (See Comments)    TROUBLE SWALLOWING  . Tape     Adhesive tape--itching     Current Outpatient Medications:  .  albuterol (VENTOLIN HFA) 108 (90 Base) MCG/ACT inhaler, Inhale 2 puffs into the lungs every 6 (six) hours as needed for wheezing or shortness of breath., Disp: 18 g, Rfl: 0 .  amitriptyline (ELAVIL) 100 MG tablet, TAKE 1 TABLET(100 MG) BY MOUTH AT BEDTIME, Disp: 30 tablet, Rfl: 0 .  clonazePAM (KLONOPIN) 0.5 MG tablet, 1 or 2 tabs at bedtime as needed for sleep, Disp: 30 tablet, Rfl: 5 .  estradiol (ESTRACE) 1 MG tablet, Take 1 tablet (1 mg total) by mouth daily., Disp: 30 tablet, Rfl: 6 .  gabapentin (NEURONTIN) 300 MG capsule, TAKE 1 CAPSULE(300 MG) BY MOUTH AT BEDTIME (Patient taking differently: Take 300 mg by mouth as directed. Pt takes 1 capsule i4x throughout the day and 2 capsule at night), Disp: 90 capsule, Rfl: 1 .  lamoTRIgine (LAMICTAL) 100 MG tablet, TAKE 1 TABLET BY MOUTH EVERY MORNING AND 2 EVERY EVENING, Disp: 90 tablet, Rfl: 5 .  Multiple Vitamin (MULTIVITAMIN WITH MINERALS) TABS tablet, Take 1 tablet by mouth daily after breakfast. Centrum Women's 50+, Disp: , Rfl:  .  simvastatin (ZOCOR) 40 MG tablet, TAKE 1 TABLET BY MOUTH EVERY DAY IN THE EVENING, Disp: 90 tablet, Rfl: 0

## 2020-09-02 NOTE — Telephone Encounter (Signed)
Call placed to patient and a VM was left informing patient to return phone call.

## 2020-09-04 NOTE — Telephone Encounter (Signed)
Will forward to nurse 

## 2020-09-04 NOTE — Telephone Encounter (Signed)
Pt would like a refill on simvastatin and Elavil.

## 2020-09-04 NOTE — Telephone Encounter (Signed)
Pt returned called I tried to transfer to the office was put on hold while receptionist got the nurse.  Patient hung up while waiting to be transferred.  Pease have nurse to return call to the patient.

## 2020-09-07 MED ORDER — AMITRIPTYLINE HCL 100 MG PO TABS: 100.0000 mg | ORAL_TABLET | Freq: Every day | ORAL | 0 refills | Status: DC

## 2020-09-07 MED ORDER — SIMVASTATIN 40 MG PO TABS: ORAL_TABLET | ORAL | 0 refills | Status: DC

## 2020-09-07 NOTE — Telephone Encounter (Signed)
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

## 2020-09-07 NOTE — Telephone Encounter (Signed)
Ok. I have refilled it.

## 2020-09-07 NOTE — Telephone Encounter (Signed)
I have refilled Simvastatin. What is the indication for Elavil as I do not have that documented in my notes and it seems a nurse has been refilling for her on my behalf. Thanks.

## 2020-09-08 NOTE — Progress Notes (Signed)
I reviewed note and agree with plan.   Penni Bombard, MD 3/50/7573, 2:25 PM Certified in Neurology, Neurophysiology and Neuroimaging  [facility] [address] [place], [place] [address] [phone]

## 2020-09-09 ENCOUNTER — Ambulatory Visit: Payer: Medicare Other | Admitting: Orthopaedic Surgery

## 2020-09-17 ENCOUNTER — Ambulatory Visit: Payer: Self-pay | Admitting: Internal Medicine

## 2020-09-17 DIAGNOSIS — G894 Chronic pain syndrome: Secondary | ICD-10-CM | POA: Diagnosis not present

## 2020-09-17 DIAGNOSIS — R109 Unspecified abdominal pain: Secondary | ICD-10-CM | POA: Diagnosis not present
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
## 2020-09-21 ENCOUNTER — Ambulatory Visit: Payer: Self-pay | Admitting: Internal Medicine

## 2020-09-24 DIAGNOSIS — K635 Polyp of colon: Secondary | ICD-10-CM | POA: Diagnosis not present

## 2020-09-24 DIAGNOSIS — K2980 Duodenitis without bleeding: Secondary | ICD-10-CM | POA: Diagnosis not present

## 2020-09-24 DIAGNOSIS — K3189 Other diseases of stomach and duodenum: Secondary | ICD-10-CM | POA: Diagnosis not present

## 2020-09-24 DIAGNOSIS — K2930 Chronic superficial gastritis without bleeding: Secondary | ICD-10-CM | POA: Diagnosis not present

## 2020-09-24 DIAGNOSIS — K2270 Barrett's esophagus without dysplasia: Secondary | ICD-10-CM | POA: Diagnosis not present

## 2020-09-24 DIAGNOSIS — R634 Abnormal weight loss: Secondary | ICD-10-CM | POA: Diagnosis not present

## 2020-09-24 DIAGNOSIS — Z86010 Personal history of colonic polyps: Secondary | ICD-10-CM | POA: Diagnosis not present

## 2020-09-24 DIAGNOSIS — D123 Benign neoplasm of transverse colon: Secondary | ICD-10-CM | POA: Diagnosis not present

## 2020-09-24 DIAGNOSIS — R197 Diarrhea, unspecified: Secondary | ICD-10-CM | POA: Diagnosis not present

## 2020-09-26 IMAGING — DX DG HIP (WITH OR WITHOUT PELVIS) 2-3V*R*
3 series · 3 of 3 positions shown · non-contrast
Comparison: None.

CLINICAL DATA: Posterior hip pain for 2 years, no known injury,
initial encounter

EXAM:
DG HIP (WITH OR WITHOUT PELVIS) 3V RIGHT

[pelvis ap]
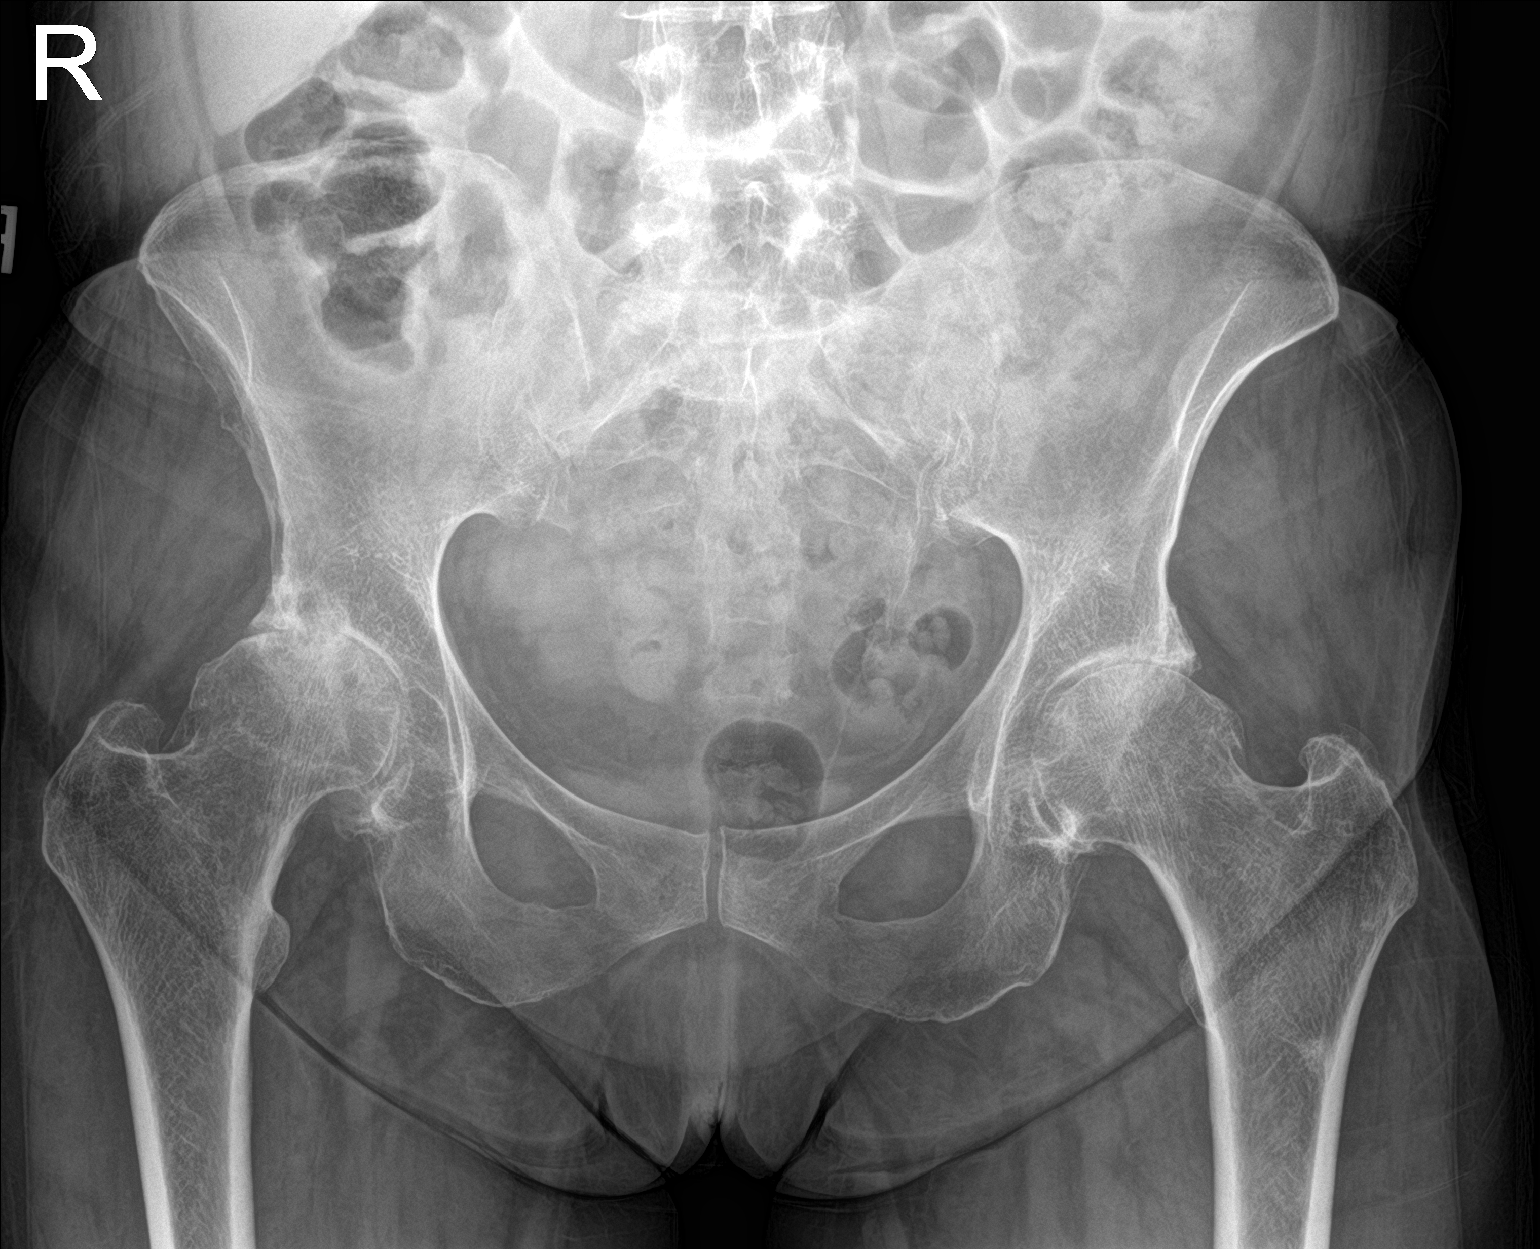

[hip ap]
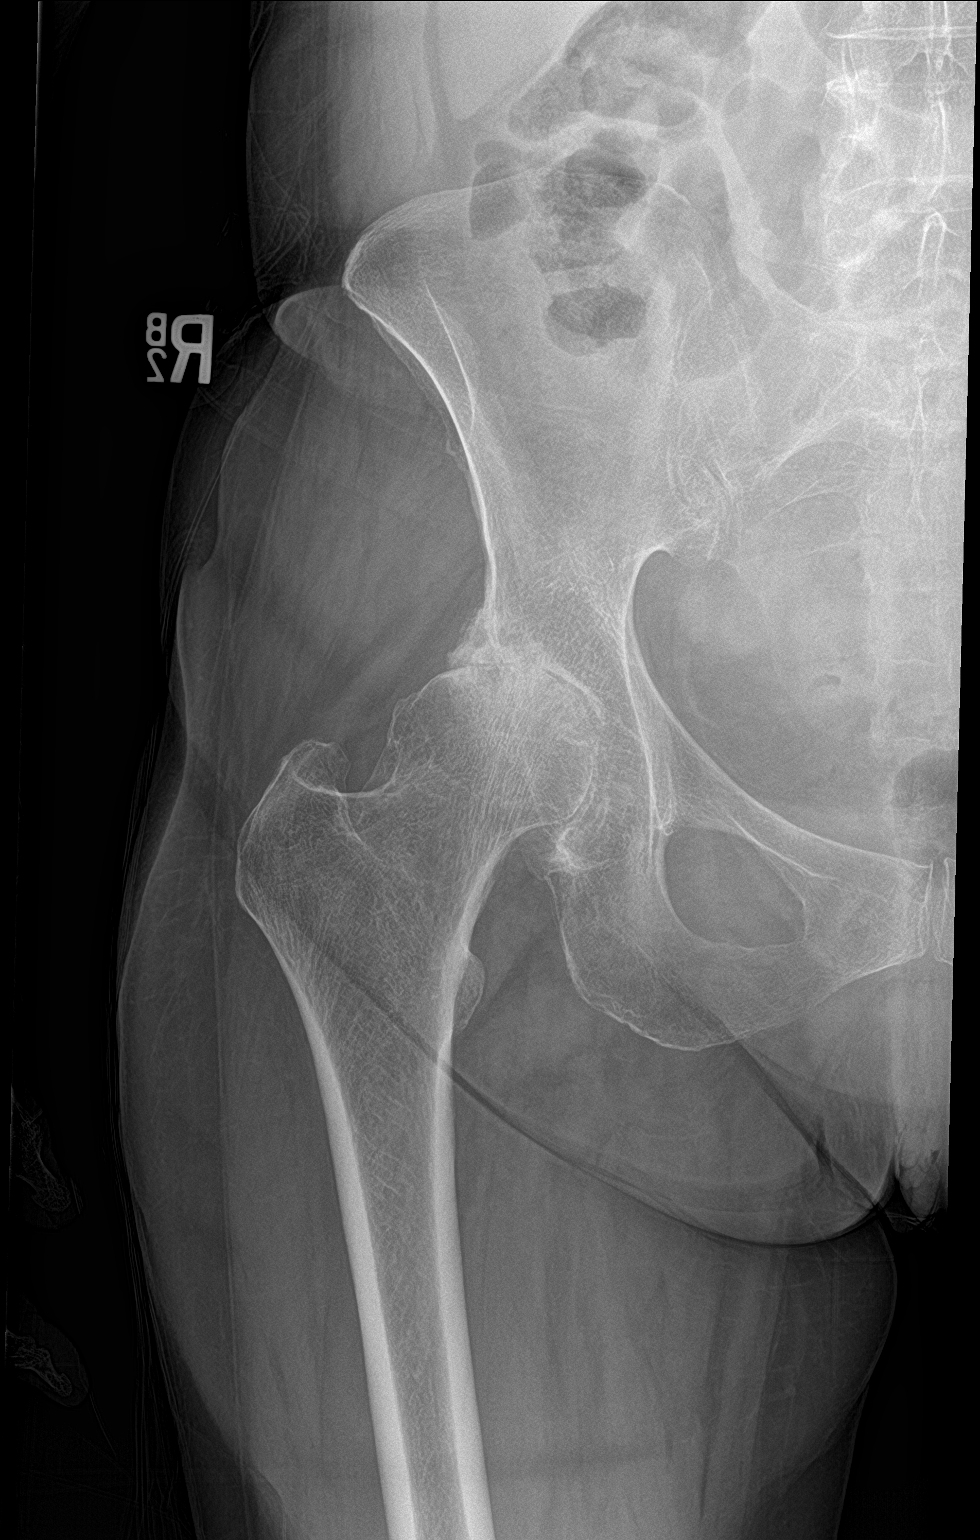

[hip lat]
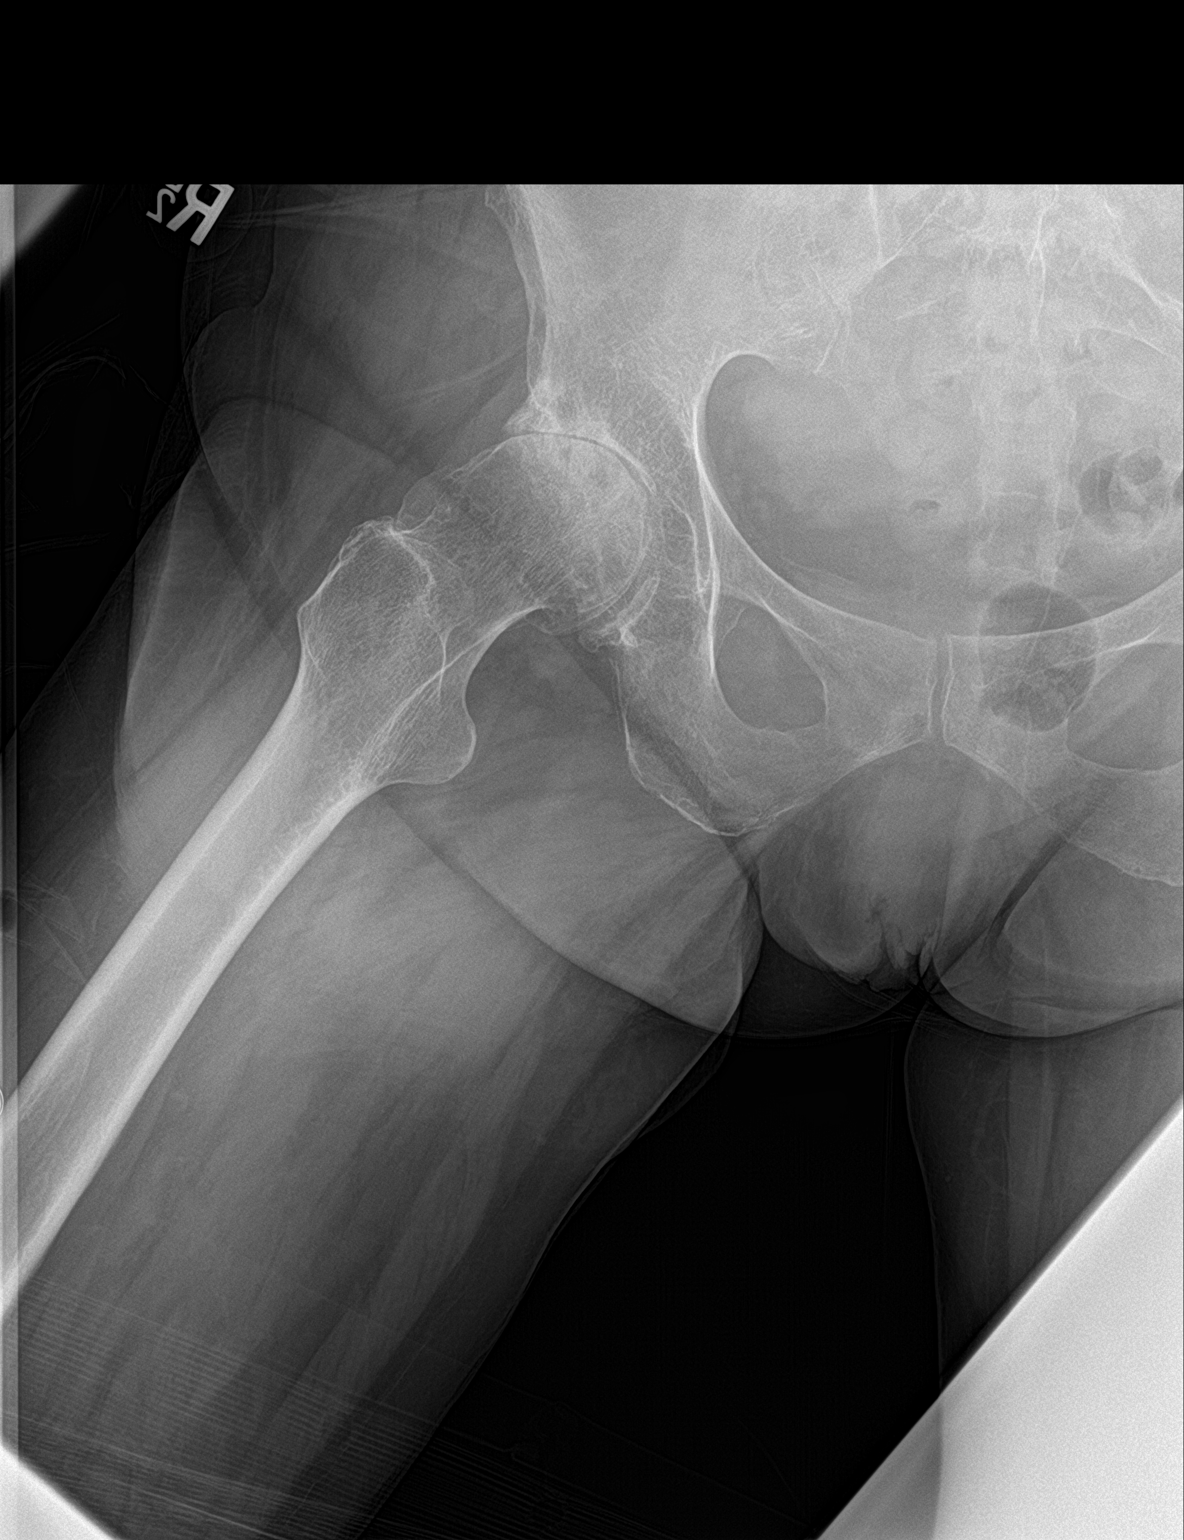

[3 of 3 positions shown; findings below may reference images not displayed]

FINDINGS: Pelvic ring is intact. Degenerative changes of the hip joints are
noted bilaterally right greater than left with bone-on-bone contact
superiorly as well as mild remodeling of the right femoral head. No
acute fracture or dislocation is noted. Subchondral sclerosis and
cyst formation is noted as well.
IMPRESSION: Advanced degenerative changes in the right hip joint as described.

## 2020-09-30 DIAGNOSIS — K2270 Barrett's esophagus without dysplasia: Secondary | ICD-10-CM | POA: Diagnosis not present

## 2020-09-30 DIAGNOSIS — K635 Polyp of colon: Secondary | ICD-10-CM | POA: Diagnosis not present

## 2020-09-30 DIAGNOSIS — K2980 Duodenitis without bleeding: Secondary | ICD-10-CM | POA: Diagnosis not present

## 2020-09-30 DIAGNOSIS — K2930 Chronic superficial gastritis without bleeding: Secondary | ICD-10-CM | POA: Diagnosis not present

## 2020-09-30 DIAGNOSIS — D123 Benign neoplasm of transverse colon: Secondary | ICD-10-CM | POA: Diagnosis not present

## 2020-10-07 ENCOUNTER — Other Ambulatory Visit: Payer: Self-pay | Admitting: Family Medicine

## 2020-10-07 ENCOUNTER — Ambulatory Visit: Payer: Medicare Other | Admitting: Physician Assistant

## 2020-10-07 DIAGNOSIS — K117 Disturbances of salivary secretion: Secondary | ICD-10-CM

## 2020-10-07 NOTE — Telephone Encounter (Signed)
  Notes to clinic: Patient was scheduled for appt today and canceled it Review for refill Overdue for office visit    Requested Prescriptions  Pending Prescriptions Disp Refills   amitriptyline (ELAVIL) 100 MG tablet [Pharmacy Med Name: AMITRIPTYLINE 100MG  (HUNDRED MG)TAB] 30 tablet 0    Sig: TAKE 1 TABLET(100 MG) BY MOUTH AT BEDTIME      Psychiatry:  Antidepressants - Heterocyclics (TCAs) Failed - 10/07/2020  7:11 AM      Failed - Completed PHQ-2 or PHQ-9 in the last 360 days      Failed - Valid encounter within last 6 months    Recent Outpatient Visits           9 months ago [place], Enobong, MD   9 months ago Other constipation   [place], Enobong, MD   11 months ago Other constipation   [place], Enobong, MD   1 year ago Acute pain of left shoulder   [place], Enobong, MD   1 year ago Menopausal vasomotor syndrome   [facility] Charlott Rakes, MD

## 2020-10-09 DIAGNOSIS — Z20822 Contact with and (suspected) exposure to covid-19: Secondary | ICD-10-CM | POA: Diagnosis not present

## 2020-10-12 ENCOUNTER — Telehealth: Payer: Self-pay | Admitting: Nurse Practitioner

## 2020-10-12 ENCOUNTER — Ambulatory Visit: Payer: Self-pay | Admitting: Primary Care

## 2020-10-12 NOTE — Telephone Encounter (Signed)
VM was left to schedule AWV

## 2020-10-15 DIAGNOSIS — R109 Unspecified abdominal pain: Secondary | ICD-10-CM | POA: Diagnosis not present

## 2020-10-15 DIAGNOSIS — G894 Chronic pain syndrome: Secondary | ICD-10-CM | POA: Diagnosis not present

## 2020-10-27 ENCOUNTER — Encounter: Payer: Self-pay | Admitting: Primary Care

## 2020-10-27 ENCOUNTER — Ambulatory Visit (INDEPENDENT_AMBULATORY_CARE_PROVIDER_SITE_OTHER): Payer: Medicare Other | Admitting: Primary Care

## 2020-10-27 ENCOUNTER — Other Ambulatory Visit: Payer: Self-pay

## 2020-10-27 ENCOUNTER — Other Ambulatory Visit: Payer: Self-pay | Admitting: Primary Care

## 2020-10-27 DIAGNOSIS — J449 Chronic obstructive pulmonary disease, unspecified: Secondary | ICD-10-CM

## 2020-10-27 DIAGNOSIS — G4733 Obstructive sleep apnea (adult) (pediatric): Secondary | ICD-10-CM | POA: Diagnosis not present

## 2020-10-27 DIAGNOSIS — R0609 Other forms of dyspnea: Secondary | ICD-10-CM

## 2020-10-27 MED ORDER — ALBUTEROL SULFATE HFA 108 (90 BASE) MCG/ACT IN AERS: 2.0000 | INHALATION_SPRAY | Freq: Four times a day (QID) | RESPIRATORY_TRACT | 1 refills | Status: DC | PRN

## 2020-10-27 NOTE — Patient Instructions (Addendum)
Recommendations: Call quit line and make plan to quit smoking  Orders: HST re: OSA  Follow-up: 6 week video or televisit with Beth NP  to review sleep study     Sleep Apnea Sleep apnea affects breathing during sleep. It causes breathing to stop for 10 seconds or more, or to become shallow. People with sleep apnea usually snoreloudly. It can also increase the risk of: Heart attack. Stroke. Being very overweight (obese). Diabetes. Heart failure. Irregular heartbeat. High blood pressure. The goal of treatment is to help you breathe normally again. What are the causes?  The most common cause of this condition is a collapsed or blocked airway. There are three kinds of sleep apnea: Obstructive sleep apnea. This is caused by a blocked or collapsed airway. Central sleep apnea. This happens when the brain does not send the right signals to the muscles that control breathing. Mixed sleep apnea. This is a combination of obstructive and central sleep apnea. What increases the risk? Being overweight. Smoking. Having a small airway. Being older. Being female. Drinking alcohol. Taking medicines to calm yourself (sedatives or tranquilizers). Having family members with the condition. Having a tongue or tonsils that are larger than normal. What are the signs or symptoms? Trouble staying asleep. Loud snoring. Headaches in the morning. Waking up gasping. Dry mouth or sore throat in the morning. Being sleepy or tired during the day. If you are sleepy or tired during the day, you may also: Not be able to focus your mind (concentrate). Forget things. Get angry a lot and have mood swings. Feel sad (depressed). Have changes in your personality. Have less interest in sex, if you are female. Be unable to have an erection, if you are female. How is this treated?  Sleeping on your side. Using a medicine to get rid of mucus in your nose (decongestant). Avoiding the use of alcohol, medicines  to help you relax, or certain pain medicines (narcotics). Losing weight, if needed. Changing your diet. Quitting smoking. Using a machine to open your airway while you sleep, such as: An oral appliance. This is a mouthpiece that shifts your lower jaw forward. A CPAP device. This device blows air through a mask when you breathe out (exhale). An EPAP device. This has valves that you put in each nostril. A BPAP device. This device blows air through a mask when you breathe in (inhale) and breathe out. Having surgery if other treatments do not work. Follow these instructions at home: Lifestyle Make changes that your doctor recommends. Eat a healthy diet. Lose weight if needed. Avoid alcohol, medicines to help you relax, and some pain medicines. Do not smoke or use any products that contain nicotine or tobacco. If you need help quitting, ask your doctor. General instructions Take over-the-counter and prescription medicines only as told by your doctor. If you were given a machine to use while you sleep, use it only as told by your doctor. If you are having surgery, make sure to tell your doctor you have sleep apnea. You may need to bring your device with you. Keep all follow-up visits. Contact a doctor if: The machine that you were given to use during sleep bothers you or does not seem to be working. You do not get better. You get worse. Get help right away if: Your chest hurts. You have trouble breathing in enough air. You have an uncomfortable feeling in your back, arms, or stomach. You have trouble talking. One side of your body feels weak. A  part of your face is hanging down. These symptoms may be an emergency. Get help right away. Call your local emergency services (911 in the [place] Do not wait to see if the symptoms will go away. Do not drive yourself to the hospital. Summary This condition affects breathing during sleep. The most common cause is a collapsed or blocked  airway. The goal of treatment is to help you breathe normally while you sleep. This information is not intended to replace advice given to you by your health care provider. Make sure you discuss any questions you have with your healthcare provider. Document Revised: 03/13/2020 Document Reviewed: 03/13/2020 Elsevier Patient Education  2022 [place].      Steps to Quit Smoking Smoking tobacco is the leading cause of preventable death. It can affect almost every organ in the body. Smoking puts you and people around you at risk for many serious, long-lasting (chronic) diseases. Quitting smoking can be hard, but it is one of the best things thatyou can do for your health. It is never too late to quit. How do I get ready to quit? When you decide to quit smoking, make a plan to help you succeed. Before you quit: Pick a date to quit. Set a date within the next 2 weeks to give you time to prepare. Write down the reasons why you are quitting. Keep this list in places where you will see it often. Tell your family, friends, and co-workers that you are quitting. Their support is important. Talk with your doctor about the choices that may help you quit. Find out if your health insurance will pay for these treatments. Know the people, places, things, and activities that make you want to smoke (triggers). Avoid them. What first steps can I take to quit smoking? Throw away all cigarettes at home, at work, and in your car. Throw away the things that you use when you smoke, such as ashtrays and lighters. Clean your car. Make sure to empty the ashtray. Clean your home, including curtains and carpets. What can I do to help me quit smoking? Talk with your doctor about taking medicines and seeing a counselor at the same time. You are more likely to succeed when you do both. If you are pregnant or breastfeeding, talk with your doctor about counseling or other ways to quit smoking. Do not take medicine to  help you quit smoking unless your doctor tells you to do so. To quit smoking: Quit right away Quit smoking totally, instead of slowly cutting back on how much you smoke over a period of time. Go to counseling. You are more likely to quit if you go to counseling sessions regularly. Take medicine You may take medicines to help you quit. Some medicines need a prescription, and some you can buy over-the-counter. Some medicines may contain a drug called nicotine to replace the nicotine in cigarettes. Medicines may: Help you to stop having the desire to smoke (cravings). Help to stop the problems that come when you stop smoking (withdrawal symptoms). Your doctor may ask you to use: Nicotine patches, gum, or lozenges. Nicotine inhalers or sprays. Non-nicotine medicine that is taken by mouth. Find resources Find resources and other ways to help you quit smoking and remain smoke-free after you quit. These resources are most helpful when you use them often. They include: Online chats with a Social worker. Phone quitlines. Printed Furniture conservator/restorer. Support groups or group counseling. Text messaging programs. Mobile phone apps. Use apps on your mobile phone  or tablet that can help you stick to your quit plan. There are many free apps for mobile phones and tablets as well as websites. Examples include Quit Guide from the [employer] and smokefree.gov  What things can I do to make it easier to quit?  Talk to your family and friends. Ask them to support and encourage you. Call a phone quitline (1-800-QUIT-NOW), reach out to support groups, or work with a Social worker. Ask people who smoke to not smoke around you. Avoid places that make you want to smoke, such as: Bars. Parties. Smoke-break areas at work. Spend time with people who do not smoke. Lower the stress in your life. Stress can make you want to smoke. Try these things to help your stress: Getting regular exercise. Doing deep-breathing  exercises. Doing yoga. Meditating. Doing a body scan. To do this, close your eyes, focus on one area of your body at a time from head to toe. Notice which parts of your body are tense. Try to relax the muscles in those areas. How will I feel when I quit smoking? Day 1 to 3 weeks Within the first 24 hours, you may start to have some problems that come from quitting tobacco. These problems are very bad 2-3 days after you quit, but they do not often last for more than 2-3 weeks. You may get these symptoms: Mood swings. Feeling restless, nervous, angry, or annoyed. Trouble concentrating. Dizziness. Strong desire for high-sugar foods and nicotine. Weight gain. Trouble pooping (constipation). Feeling like you may vomit (nausea). Coughing or a sore throat. Changes in how the medicines that you take for other issues work in your body. Depression. Trouble sleeping (insomnia). Week 3 and afterward After the first 2-3 weeks of quitting, you may start to notice more positive results, such as: Better sense of smell and taste. Less coughing and sore throat. Slower heart rate. Lower blood pressure. Clearer skin. Better breathing. Fewer sick days. Quitting smoking can be hard. Do not give up if you fail the first time. Some people need to try a few times before they succeed. Do your best to stick to your quit plan, and talk with yourdoctor if you have any questions or concerns. Summary Smoking tobacco is the leading cause of preventable death. Quitting smoking can be hard, but it is one of the best things that you can do for your health. When you decide to quit smoking, make a plan to help you succeed. Quit smoking right away, not slowly over a period of time. When you start quitting, seek help from your doctor, family, or friends. This information is not intended to replace advice given to you by your health care provider. Make sure you discuss any questions you have with your healthcare  provider. Document Revised: 12/28/2018 Document Reviewed: 06/23/2018 Elsevier Patient Education  [place].

## 2020-10-27 NOTE — Assessment & Plan Note (Signed)
-   Intolerant to CPAP. She has lost 20 lbs since HST last year, recommend repeat sleep study to re-evaluate for OSA. If patient still has sleep apnea would consider referral for oral appliance

## 2020-10-27 NOTE — Assessment & Plan Note (Addendum)
-   Mild obstruction on PFTs in November 2021. Stable interval, no recent exacerbations. She has occasional respiratory symptoms including cough/chest tightness which is well managed with prn SABA. If symptoms worsen would recommend trial LAMA such as Spiriva or Incruse
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
## 2020-11-01 ENCOUNTER — Other Ambulatory Visit: Payer: Self-pay | Admitting: Obstetrics & Gynecology

## 2020-11-01 DIAGNOSIS — N951 Menopausal and female climacteric states: Secondary | ICD-10-CM

## 2020-11-06 ENCOUNTER — Other Ambulatory Visit: Payer: Self-pay | Admitting: Family Medicine

## 2020-11-06 DIAGNOSIS — K117 Disturbances of salivary secretion: Secondary | ICD-10-CM

## 2020-11-06 NOTE — Telephone Encounter (Signed)
Requested medication (s) are due for refill today: yes  Requested medication (s) are on the active medication list: yes  Last refill:  10/09/2020  Future visit scheduled: no  Notes to clinic:  Patient hd appt scheduled for 10/07/2020 and canceled appt  Overdue for follow up    Requested Prescriptions  Pending Prescriptions Disp Refills   amitriptyline (ELAVIL) 100 MG tablet [Pharmacy Med Name: AMITRIPTYLINE '100MG'$  (HUNDRED MG)TAB] 30 tablet 0    Sig: TAKE 1 TABLET(100 MG) BY MOUTH AT BEDTIME      Psychiatry:  Antidepressants - Heterocyclics (TCAs) Failed - 11/06/2020  7:06 AM      Failed - Completed PHQ-2 or PHQ-9 in the last 360 days      Failed - Valid encounter within last 6 months    Recent Outpatient Visits           10 months ago [place], Enobong, MD   10 months ago Other constipation   [place], Enobong, MD   1 year ago Other constipation   [place], Enobong, MD   1 year ago Acute pain of left shoulder   [place], Enobong, MD   1 year ago Menopausal vasomotor syndrome   [place], Enobong, MD       Future Appointments             In 1 month Martyn Ehrich, NP [facility]

## 2020-11-12 DIAGNOSIS — Z79891 Long term (current) use of opiate analgesic: Secondary | ICD-10-CM | POA: Diagnosis not present

## 2020-11-12 DIAGNOSIS — Z79899 Other long term (current) drug therapy: Secondary | ICD-10-CM | POA: Diagnosis not present

## 2020-11-12 DIAGNOSIS — G894 Chronic pain syndrome: Secondary | ICD-10-CM | POA: Diagnosis not present

## 2020-11-12 DIAGNOSIS — R109 Unspecified abdominal pain: Secondary | ICD-10-CM | POA: Diagnosis not present

## 2020-11-18 DIAGNOSIS — Z20822 Contact with and (suspected) exposure to covid-19: Secondary | ICD-10-CM | POA: Diagnosis not present

## 2020-11-19 ENCOUNTER — Other Ambulatory Visit: Payer: Self-pay | Admitting: Family Medicine

## 2020-11-19 DIAGNOSIS — K117 Disturbances of salivary secretion: Secondary | ICD-10-CM

## 2020-11-19 NOTE — Telephone Encounter (Signed)
Requested medication (s) are due for refill today: Yes  Requested medication (s) are on the active medication list: Yes  Last refill:  10/07/20  Future visit scheduled: No  Notes to clinic:  Unable to refill per protocol, appointment needed.      Requested Prescriptions  Pending Prescriptions Disp Refills   amitriptyline (ELAVIL) 100 MG tablet 30 tablet 0      Psychiatry:  Antidepressants - Heterocyclics (TCAs) Failed - 11/19/2020 10:41 AM      Failed - Completed PHQ-2 or PHQ-9 in the last 360 days      Failed - Valid encounter within last 6 months    Recent Outpatient Visits           10 months ago [place], Enobong, MD   11 months ago Other constipation   [place], Enobong, MD   1 year ago Other constipation   [place], Enobong, MD   1 year ago Acute pain of left shoulder   [place], Enobong, MD   1 year ago Menopausal vasomotor syndrome   [place], Enobong, MD       Future Appointments             In 3 weeks Martyn Ehrich, NP [facility]

## 2020-11-19 NOTE — Telephone Encounter (Signed)
Patient called, left VM to return the call to the office to schedule an OV for follow up.  ? ?

## 2020-11-19 NOTE — Telephone Encounter (Signed)
Medication Refill - Medication: amitriptyline (ELAVIL) 100 MG tablet  Has the patient contacted their pharmacy? No   (Agent: If yes, when and what did the pharmacy advise?) Patient sates unable to connect with pharmacist  and is completely out of medication   Preferred Pharmacy (with phone number or street name):   [facility] [place] DR[facility] STORE [place], [place] - [address] Phone:  [phone]  Fax:  [phone]      Agent: Please be advised that RX refills may take up to 3 business days. We ask that you follow-up with your pharmacy.

## 2020-11-23 ENCOUNTER — Other Ambulatory Visit: Payer: Self-pay | Admitting: Primary Care

## 2020-11-23 DIAGNOSIS — R0609 Other forms of dyspnea: Secondary | ICD-10-CM

## 2020-11-25 ENCOUNTER — Other Ambulatory Visit: Payer: Self-pay | Admitting: Internal Medicine

## 2020-11-25 MED ORDER — CLONAZEPAM 0.5 MG PO TABS: ORAL_TABLET | ORAL | 5 refills | Status: DC

## 2020-11-25 NOTE — Telephone Encounter (Signed)
Clonazepam refilled 

## 2020-11-25 NOTE — Telephone Encounter (Signed)
Call made to patient, confirmed DOB. Made aware refill has been sent. Voiced understanding.   Nothing further needed at this time.

## 2020-11-25 NOTE — Telephone Encounter (Signed)
Call made to patient, confirmed DOB. Requesting refill of Klonopin.   Last OV:10/27/20 Last Filled: 09/01/20   CY please advise. Medication has been pended.

## 2020-12-01 DIAGNOSIS — H30033 Focal chorioretinal inflammation, peripheral, bilateral: Secondary | ICD-10-CM | POA: Diagnosis not present

## 2020-12-01 DIAGNOSIS — Z79899 Other long term (current) drug therapy: Secondary | ICD-10-CM | POA: Diagnosis not present

## 2020-12-01 DIAGNOSIS — Z961 Presence of intraocular lens: Secondary | ICD-10-CM | POA: Diagnosis not present

## 2020-12-01 DIAGNOSIS — H35372 Puckering of macula, left eye: Secondary | ICD-10-CM | POA: Diagnosis not present

## 2020-12-02 ENCOUNTER — Telehealth: Payer: Self-pay | Admitting: Internal Medicine

## 2020-12-02 NOTE — Telephone Encounter (Signed)
I do not have this HST to be scheduled.

## 2020-12-05 ENCOUNTER — Other Ambulatory Visit: Payer: Self-pay | Admitting: Obstetrics & Gynecology

## 2020-12-05 DIAGNOSIS — N951 Menopausal and female climacteric states: Secondary | ICD-10-CM

## 2020-12-07 ENCOUNTER — Other Ambulatory Visit: Payer: Self-pay | Admitting: Obstetrics & Gynecology

## 2020-12-07 ENCOUNTER — Telehealth: Payer: Self-pay | Admitting: *Deleted

## 2020-12-07 DIAGNOSIS — N951 Menopausal and female climacteric states: Secondary | ICD-10-CM

## 2020-12-07 NOTE — Telephone Encounter (Signed)
8/22-LVM for pt to call back and sched HST

## 2020-12-07 NOTE — Telephone Encounter (Signed)
Bernadetta left 2 messages she is waiting for ok for estradiol prescription. States she needs to pick up some other prescriptions and wants to get estradiol at same time. States [facility] on [address] sent 2 requests for Estradiol from Dr. Roselie Awkward . Per chart review has not seen Dr. Roselie Awkward in over 2 years.  Atilano Covelli,RN

## 2020-12-08 ENCOUNTER — Telehealth: Payer: Self-pay | Admitting: Family Medicine

## 2020-12-08 DIAGNOSIS — G2581 Restless legs syndrome: Secondary | ICD-10-CM

## 2020-12-08 DIAGNOSIS — E2839 Other primary ovarian failure: Secondary | ICD-10-CM

## 2020-12-08 NOTE — Telephone Encounter (Signed)
Patient states her  pain management doctor Dr. Annamarie Dawley is no longer with the practice and he use to prescribe trazodone 500 mg due to her insomnia, patient would like PCP to prescribe.    Patient unable to reach her OB GYN who prescribed estradiol due to her hot flashes, patient would like PCP to prescribe.      [facility] DR[facility] STORE [place], [place] Phone:  [phone]  Fax:  [phone]

## 2020-12-08 NOTE — Telephone Encounter (Signed)
  Patient requesting orders for  bone density and a referral to neurology for restless leg Art Buff, MD

## 2020-12-09 ENCOUNTER — Telehealth: Payer: Self-pay | Admitting: Internal Medicine

## 2020-12-09 NOTE — Telephone Encounter (Signed)
Patient would like a prescription to quit smoking. Is this ok to send in?  Dr. Annamaria Boots please advise

## 2020-12-09 NOTE — Telephone Encounter (Signed)
Sched for 9/1 at 3:00pm

## 2020-12-09 NOTE — Telephone Encounter (Signed)
Unfortunately I have never had to prescribe such a huge dose of trazodone, the max I would prescribe within my scope of practice is 200 mg for insomnia.  What kind of specialist is Dr. Delena Bali- I can place a referral for her.  She can also reach out to a colleague of Dr. Delena Bali within the same practice. Estradiol rx will need to come from her GYN.

## 2020-12-10 ENCOUNTER — Telehealth: Payer: Self-pay | Admitting: Family Medicine

## 2020-12-10 DIAGNOSIS — Z79891 Long term (current) use of opiate analgesic: Secondary | ICD-10-CM | POA: Diagnosis not present

## 2020-12-10 DIAGNOSIS — R109 Unspecified abdominal pain: Secondary | ICD-10-CM | POA: Diagnosis not present

## 2020-12-10 DIAGNOSIS — G894 Chronic pain syndrome: Secondary | ICD-10-CM | POA: Diagnosis not present

## 2020-12-10 NOTE — Telephone Encounter (Signed)
Copied from [place] [phone]. Topic: Complaint - Sensitive >> Dec 08, 2020 11:33 AM Oneta Rack wrote: Patient is unhappy with the care she has been receiving from PCP and would like to speak with Practice Admin

## 2020-12-10 NOTE — Telephone Encounter (Signed)
[name] will need some help to pick the right treatment. Please refer her to the pharmacy managed telephone smoking cessation program  (Rx pulm tobacco) - may have to ask [name] or someone how that is actually initiated. It is not just a referral order.

## 2020-12-11 NOTE — Telephone Encounter (Signed)
Hey team, Patient would like RX for to stop smoking, Dr. Annamaria Boots says she will need some help to pick the right treatment. Please refer her to the pharmacy managed telephone smoking cessation program  (Rx pulm tobacco)   Please advise

## 2020-12-14 ENCOUNTER — Ambulatory Visit: Payer: Medicare Other | Admitting: Primary Care

## 2020-12-14 NOTE — Telephone Encounter (Signed)
Called pt; VM left stating I am returning call. Callback number for [facility] left on VM, where pt is usually seen. Call routed to correct office.

## 2020-12-15 ENCOUNTER — Telehealth: Payer: Self-pay | Admitting: *Deleted

## 2020-12-15 NOTE — Telephone Encounter (Signed)
Returned patient's call regarding request for medication refill. No answer. Left voicemail stating that she would need to be seen in the office for annual check up. Annual overdue from 07/30/19. Patient does not have MyChart access.

## 2020-12-15 NOTE — Telephone Encounter (Signed)
Returned patients call, forwarded from another [facility] location. Informed patient that we could not refill her prescription until she is seen for her annual check up. Overdue from 07/30/19.

## 2020-12-17 ENCOUNTER — Other Ambulatory Visit: Payer: Self-pay

## 2020-12-17 ENCOUNTER — Ambulatory Visit: Payer: Medicare Other

## 2020-12-17 DIAGNOSIS — R0609 Other forms of dyspnea: Secondary | ICD-10-CM

## 2020-12-19 ENCOUNTER — Other Ambulatory Visit: Payer: Self-pay | Admitting: Family Medicine

## 2020-12-19 DIAGNOSIS — E782 Mixed hyperlipidemia: Secondary | ICD-10-CM

## 2020-12-22 ENCOUNTER — Other Ambulatory Visit: Payer: Self-pay | Admitting: Family Medicine

## 2020-12-22 DIAGNOSIS — Z1231 Encounter for screening mammogram for malignant neoplasm of breast: Secondary | ICD-10-CM

## 2020-12-22 NOTE — Telephone Encounter (Signed)
Requested medication (s) are due for refill today: Yes  Requested medication (s) are on the active medication list: Yes  Last refill:  09/07/20  Future visit scheduled: Yes  Notes to clinic:  Protocol indicates pt. Needs lab work.    Requested Prescriptions  Pending Prescriptions Disp Refills   simvastatin (ZOCOR) 40 MG tablet [Pharmacy Med Name: SIMVASTATIN '40MG'$  TABLETS] 90 tablet 0    Sig: TAKE 1 TABLET BY MOUTH EVERY DAY IN THE EVENING     Cardiovascular:  Antilipid - Statins Failed - 12/19/2020 10:26 AM      Failed - Total Cholesterol in normal range and within 360 days    Cholesterol, Total  Date Value Ref Range Status  08/23/2019 182 100 - 199 mg/dL Final          Failed - LDL in normal range and within 360 days    LDL Chol Calc (NIH)  Date Value Ref Range Status  08/23/2019 102 (H) 0 - 99 mg/dL Final   Direct LDL  Date Value Ref Range Status  05/29/2006 160 mg/dL           Failed - HDL in normal range and within 360 days    HDL  Date Value Ref Range Status  08/23/2019 53 >39 mg/dL Final          Failed - Triglycerides in normal range and within 360 days    Triglycerides  Date Value Ref Range Status  08/23/2019 156 (H) 0 - 149 mg/dL Final          Passed - Patient is not pregnant      Passed - Valid encounter within last 12 months    Recent Outpatient Visits           11 months ago [place], Charlane Ferretti, MD   1 year ago Other constipation   [place], Charlane Ferretti, MD   1 year ago Other constipation   [place], Enobong, MD   1 year ago Acute pain of left shoulder   [place], Enobong, MD   1 year ago Menopausal vasomotor syndrome   [place], Charlane Ferretti, MD       Future Appointments             Tomorrow [place], Casimer Bilis [place]

## 2020-12-23 ENCOUNTER — Ambulatory Visit: Payer: Medicare Other | Attending: Physician Assistant | Admitting: Physician Assistant

## 2020-12-23 ENCOUNTER — Encounter: Payer: Self-pay | Admitting: Physician Assistant

## 2020-12-23 ENCOUNTER — Other Ambulatory Visit: Payer: Self-pay

## 2020-12-23 VITALS — BP 94/62 | HR 74 | Resp 16 | Wt 130.4 lb

## 2020-12-23 DIAGNOSIS — G2581 Restless legs syndrome: Secondary | ICD-10-CM | POA: Diagnosis not present

## 2020-12-23 DIAGNOSIS — Z23 Encounter for immunization: Secondary | ICD-10-CM

## 2020-12-23 DIAGNOSIS — K117 Disturbances of salivary secretion: Secondary | ICD-10-CM | POA: Diagnosis not present

## 2020-12-23 DIAGNOSIS — G4700 Insomnia, unspecified: Secondary | ICD-10-CM

## 2020-12-23 DIAGNOSIS — E782 Mixed hyperlipidemia: Secondary | ICD-10-CM | POA: Diagnosis not present

## 2020-12-23 MED ORDER — AMITRIPTYLINE HCL 100 MG PO TABS: ORAL_TABLET | ORAL | 2 refills | Status: DC

## 2020-12-23 MED ORDER — SIMVASTATIN 40 MG PO TABS: ORAL_TABLET | ORAL | 1 refills | Status: DC
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
## 2020-12-23 NOTE — Patient Instructions (Signed)
Insomnia Insomnia is a sleep disorder that makes it difficult to fall asleep or stay asleep. Insomnia can cause fatigue, low energy, difficulty concentrating, moodswings, and poor performance at work or school. There are three different ways to classify insomnia: Difficulty falling asleep. Difficulty staying asleep. Waking up too early in the morning. Any type of insomnia can be long-term (chronic) or short-term (acute). Both are common. Short-term insomnia usually lasts for three months or less. Chronic insomnia occurs at least three times a week for longer than threemonths. What are the causes? Insomnia may be caused by another condition, situation, or substance, such as: Anxiety. Certain medicines. Gastroesophageal reflux disease (GERD) or other gastrointestinal conditions. Asthma or other breathing conditions. Restless legs syndrome, sleep apnea, or other sleep disorders. Chronic pain. Menopause. Stroke. Abuse of alcohol, tobacco, or illegal drugs. Mental health conditions, such as depression. Caffeine. Neurological disorders, such as Alzheimer's disease. An overactive thyroid (hyperthyroidism). Sometimes, the cause of insomnia may not be known. What increases the risk? Risk factors for insomnia include: Gender. Women are affected more often than men. Age. Insomnia is more common as you get older. Stress. Lack of exercise. Irregular work schedule or working night shifts. Traveling between different time zones. Certain medical and mental health conditions. What are the signs or symptoms? If you have insomnia, the main symptom is having trouble falling asleep or having trouble staying asleep. This may lead to other symptoms, such as: Feeling fatigued or having low energy. Feeling nervous about going to sleep. Not feeling rested in the morning. Having trouble concentrating. Feeling irritable, anxious, or depressed. How is this diagnosed? This condition may be diagnosed based  on: Your symptoms and medical history. Your health care provider may ask about: Your sleep habits. Any medical conditions you have. Your mental health. A physical exam. How is this treated? Treatment for insomnia depends on the cause. Treatment may focus on treating an underlying condition that is causing insomnia. Treatment may also include: Medicines to help you sleep. Counseling or therapy. Lifestyle adjustments to help you sleep better. Follow these instructions at home: Eating and drinking  Limit or avoid alcohol, caffeinated beverages, and cigarettes, especially close to bedtime. These can disrupt your sleep. Do not eat a large meal or eat spicy foods right before bedtime. This can lead to digestive discomfort that can make it hard for you to sleep.  Sleep habits  Keep a sleep diary to help you and your health care provider figure out what could be causing your insomnia. Write down: When you sleep. When you wake up during the night. How well you sleep. How rested you feel the next day. Any side effects of medicines you are taking. What you eat and drink. Make your bedroom a dark, comfortable place where it is easy to fall asleep. Put up shades or blackout curtains to block light from outside. Use a white noise machine to block noise. Keep the temperature cool. Limit screen use before bedtime. This includes: Watching TV. Using your smartphone, tablet, or computer. Stick to a routine that includes going to bed and waking up at the same times every day and night. This can help you fall asleep faster. Consider making a quiet activity, such as reading, part of your nighttime routine. Try to avoid taking naps during the day so that you sleep better at night. Get out of bed if you are still awake after 15 minutes of trying to sleep. Keep the lights down, but try reading or doing a quiet   activity. When you feel sleepy, go back to bed.  General instructions Take over-the-counter  and prescription medicines only as told by your health care provider. Exercise regularly, as told by your health care provider. Avoid exercise starting several hours before bedtime. Use relaxation techniques to manage stress. Ask your health care provider to suggest some techniques that may work well for you. These may include: Breathing exercises. Routines to release muscle tension. Visualizing peaceful scenes. Make sure that you drive carefully. Avoid driving if you feel very sleepy. Keep all follow-up visits as told by your health care provider. This is important. Contact a health care provider if: You are tired throughout the day. You have trouble in your daily routine due to sleepiness. You continue to have sleep problems, or your sleep problems get worse. Get help right away if: You have serious thoughts about hurting yourself or someone else. If you ever feel like you may hurt yourself or others, or have thoughts about taking your own life, get help right away. You can go to your nearest emergency department or call: Your local emergency services (911 in the [place] A suicide crisis helpline, such as the National Suicide Prevention Lifeline at 1-800-273-8255. This is open 24 hours a day. Summary Insomnia is a sleep disorder that makes it difficult to fall asleep or stay asleep. Insomnia can be long-term (chronic) or short-term (acute). Treatment for insomnia depends on the cause. Treatment may focus on treating an underlying condition that is causing insomnia. Keep a sleep diary to help you and your health care provider figure out what could be causing your insomnia. This information is not intended to replace advice given to you by your health care provider. Make sure you discuss any questions you have with your healthcare provider. Document Revised: 02/13/2020 Document Reviewed: 02/13/2020 Elsevier Patient Education  2022 [employer]vier Inc.  

## 2020-12-24 ENCOUNTER — Encounter: Payer: Self-pay | Admitting: *Deleted

## 2020-12-24 LAB — COMPREHENSIVE METABOLIC PANEL WITH GFR
ALT: 4 [IU]/L (ref 0–32)
AST: 4 [IU]/L (ref 0–40)
Albumin/Globulin Ratio: 2 (ref 1.2–2.2)
Albumin: 4.3 g/dL (ref 3.8–4.8)
Alkaline Phosphatase: 68 [IU]/L (ref 44–121)
BUN/Creatinine Ratio: 14 (ref 12–28)
BUN: 13 mg/dL (ref 8–27)
Bilirubin Total: 0.2 mg/dL (ref 0.0–1.2)
CO2: 21 mmol/L (ref 20–29)
Calcium: 8.8 mg/dL (ref 8.7–10.3)
Chloride: 105 mmol/L (ref 96–106)
Creatinine, Ser: 0.92 mg/dL (ref 0.57–1.00)
Globulin, Total: 2.1 g/dL (ref 1.5–4.5)
Glucose: 91 mg/dL (ref 65–99)
Potassium: 4.2 mmol/L (ref 3.5–5.2)
Sodium: 142 mmol/L (ref 134–144)
Total Protein: 6.4 g/dL (ref 6.0–8.5)
eGFR: 68 mL/min/{1.73_m2}

## 2020-12-24 LAB — LIPID PANEL
Chol/HDL Ratio: 3.7 ratio (ref 0.0–4.4)
Cholesterol, Total: 157 mg/dL (ref 100–199)
HDL: 43 mg/dL
LDL Chol Calc (NIH): 87 mg/dL (ref 0–99)
Triglycerides: 157 mg/dL — ABNORMAL HIGH (ref 0–149)
VLDL Cholesterol Cal: 27 mg/dL (ref 5–40)

## 2020-12-29 ENCOUNTER — Ambulatory Visit: Payer: Medicare Other | Admitting: Obstetrics & Gynecology

## 2020-12-30 ENCOUNTER — Telehealth: Payer: Self-pay | Admitting: Critical Care Medicine

## 2020-12-30 ENCOUNTER — Other Ambulatory Visit: Payer: Self-pay | Admitting: Physician Assistant

## 2020-12-30 DIAGNOSIS — G4700 Insomnia, unspecified: Secondary | ICD-10-CM

## 2020-12-30 MED ORDER — TRAZODONE HCL 50 MG PO TABS: 25.0000 mg | ORAL_TABLET | Freq: Every evening | ORAL | 3 refills | Status: DC | PRN

## 2020-12-30 NOTE — Telephone Encounter (Signed)
Returned call to pt to go over provider message pt didn't answer lvm

## 2020-12-30 NOTE — Telephone Encounter (Signed)
Copied from [place] [id number]. Topic: General - Other >> Dec 28, 2020  1:30 PM Leward Quan A wrote: Reason for CRM: Patient called in to say that she saw Freeman Caldron and discussed Trazadone and asking for that Rx to be sent to her Pharmacy ASAP please any questions please call Ph# [phone]

## 2020-12-30 NOTE — Telephone Encounter (Signed)
Levada Dy could you please address

## 2021-01-05 ENCOUNTER — Telehealth: Payer: Self-pay | Admitting: Critical Care Medicine

## 2021-01-05 NOTE — Telephone Encounter (Signed)
Copied from [place] [id number]. Topic: General - Other >> Dec 31, 2020 10:44 AM Leward Quan A wrote: Reason for CRM: Patient called in asking for nurse to call her with lab results please at Ph#  [phone]

## 2021-01-06 NOTE — Telephone Encounter (Signed)
Left message on voicemail per [facility] 04/04/2019. Encouraged to call if they have additional questions or concerns.

## 2021-01-07 ENCOUNTER — Ambulatory Visit: Payer: Medicare Other | Admitting: Obstetrics & Gynecology

## 2021-01-07 DIAGNOSIS — Z79899 Other long term (current) drug therapy: Secondary | ICD-10-CM | POA: Diagnosis not present

## 2021-01-07 DIAGNOSIS — G894 Chronic pain syndrome: Secondary | ICD-10-CM | POA: Diagnosis not present

## 2021-01-07 DIAGNOSIS — Z79891 Long term (current) use of opiate analgesic: Secondary | ICD-10-CM | POA: Diagnosis not present

## 2021-01-07 DIAGNOSIS — R109 Unspecified abdominal pain: Secondary | ICD-10-CM | POA: Diagnosis not present

## 2021-01-15 ENCOUNTER — Telehealth: Payer: Self-pay | Admitting: Internal Medicine

## 2021-01-15 DIAGNOSIS — G4733 Obstructive sleep apnea (adult) (pediatric): Secondary | ICD-10-CM

## 2021-01-15 NOTE — Telephone Encounter (Signed)
Home sleep test showed mild to moderate obstructive sleep apnea, averaging 14 apneas/ hour with drops in oxygen level. I suggest referral to Dr Augustina Mood, DDS to consider oral appliance for OSA as alternative to CPAP.

## 2021-01-15 NOTE — Telephone Encounter (Signed)
I suggest we forward her name to [facility] and the pharmacy smoking cessation team. They can explain it to [name].

## 2021-01-15 NOTE — Telephone Encounter (Signed)
I have called and spoke with pt and she is aware of results.  She is aware of the referral to Dr. Toy Cookey, [place].  She is willing to try that.    She stated that she is going to quit smoking.  She is wanting to know if CY would prescribe her wellbutrin to try. She wanted to know if she can take this before she starts to try and quit.  She also has the patches and the lozenges, but she was told before that she cannot use all of these together.  CY please advise. Thanks

## 2021-01-15 NOTE — Telephone Encounter (Signed)
Pt is calling for HST results from 12/21/20.  CY please advise. Thanks

## 2021-01-18 ENCOUNTER — Telehealth: Payer: Self-pay

## 2021-01-18 ENCOUNTER — Other Ambulatory Visit: Payer: Self-pay

## 2021-01-18 DIAGNOSIS — G4733 Obstructive sleep apnea (adult) (pediatric): Secondary | ICD-10-CM

## 2021-01-18 NOTE — Telephone Encounter (Signed)
[name] is a 70 y.o. female and has been referred to the pharmacist telephone-based smoking cessation service on 12/09/2020 by pulmonologist Dr. Annamaria Boots  -------------------------------------------------------------------------------------------------------------------  Confirmed that patient does not meet any of the following exclusion criteria: No  Pregnancy Schizophrenia, bipolar disorder, or major depression Myocardial infarction or coronary artery bypass grafting in the last 2 months Severe or worsening angina  Currently smoking > 10 cigarettes per day? Yes  Willing to quit smoking now or within 30 days? Yes  Interested in using medications to help you quit smoking? Yes   If yes to all above, patient meets criteria for telephone-based service.  -------------------------------------------------------------------------------------------------------------------  Tobacco Use History Current tobacco use: 20 cigarettes/day Time to first cigarette: < 30 minutes Started smoking at 70 years old  Quit Attempt History  Have you tried to quit in the past? Yes  Most recent quit attempt: Apr 17, 2019 Longest time ever been tobacco free: 7 months, still used NRT while being tobacco free What helped? Felt more motivated, 'less things going on in my life' What was difficult? 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  Past pharmacotherapy trials:  []  Nicotine gum [x]  Nicotine lozenge [x]  Nicotine patch []  Nicotine inhaler []  Nicotine nasal spray [x]  Bupropion (Zyban)- quit 2 weeks when she had bronchitis, not motivated to quit at that time [x]  Varenicline (Chantix)- was on for one year and cut down to 3 cigarettes/day  Current Outpatient Medications  Medication Instructions    albuterol (VENTOLIN HFA) 108 (90 Base) MCG/ACT inhaler INHALE 2 PUFFS INTO THE LUNGS EVERY 6 HOURS AS NEEDED FOR WHEEZING OR SHORTNESS OF BREATH   amitriptyline (ELAVIL) 100 MG tablet TAKE 1 TABLET(100 MG) BY MOUTH AT BEDTIME   clonazePAM (KLONOPIN) 0.5 MG tablet TAKE 1 TO 2 TABLETS BY MOUTH AT BEDTIME AS NEEDED FOR SLEEP   cycloSPORINE (RESTASIS) 0.05 % ophthalmic emulsion 1 drop, Both Eyes, Daily   estradiol (ESTRACE) 1 mg, Oral, Daily   lamoTRIgine (LAMICTAL) 100 MG tablet TAKE 1 TABLET BY MOUTH EVERY MORNING AND 2 EVERY EVENING   Multiple Vitamin (MULTIVITAMIN WITH MINERALS) TABS tablet 1 tablet, Oral, Daily after breakfast, [facility] Women's 50+    simvastatin (ZOCOR) 40 MG tablet TAKE 1 TABLET BY MOUTH EVERY DAY IN THE EVENING   traZODone (DESYREL) 25-50 mg, Oral, At bedtime PRN   Turmeric (QC TUMERIC COMPLEX PO) 1 capsule, Daily   Vitamin D 2,000 Units, Oral, Daily    Assessment/Plan: Patient states interest in quitting smoking. Set quit date of 02/08/2021. Longest smoke free period was 7 months starting December 2022. Patient reports still using patches and lozenges during this period. 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 Has 2 boxes of 21 mg patches and 2 boxes of 4mg  lozenges provided by [facility] QUIT NOW.  She endorses she has used Chantix for ~1 year in the past (can't remember how long ago) and she got down to 3 cigarettes/day. She was receiving prescriptions from her surgeon but was then instructed to follow up with her family medicine doctor. Patient reports her PCP at that time could not/did not send refills for Chantix. Patient is not interested in a Chantix re-trial because she did not feel like it help her  quit since she was still smoking 3 cigarettes/day. Will discuss re-trial at another point if patient interested.   Instructed patient to start with using one 21 mg patch a day and gradually cut down your  smoking each day.  Discussed options for smoking cessation agents and patient is agreeable to: [x]  Nicotine lozenge 4 mg PRN [x]  Nicotine patch 21 mg daily Provided by [facility]  Treatment was reviewed with the patient including name, instructions, goals of therapy, potential adverse effects including mild itching or redness at the point of application, headache, trouble sleeping, and/or vivid dreams.   Medication counseling:  The following counseling was provided: [x]  Anticipated nicotine withdrawal symptoms [x]  Coping skills/strategies [x]  Information on [facility]-QUITNOW support program [x]  Tell family and friends about quitting [x]  Stress management  Patient was advised to contact [facility]monary Clinic if questions/concerns arise. Patient verbalized understanding of information.  Follow up in 1 week.   Time spent: 18 minutes  Joseph Art, [place].D. PGY-1 Pharmacy Resident 01/18/2021 5:02 PM

## 2021-01-20 ENCOUNTER — Ambulatory Visit: Payer: Medicare Other | Admitting: Obstetrics & Gynecology

## 2021-01-20 ENCOUNTER — Ambulatory Visit: Payer: Medicare Other

## 2021-02-01 ENCOUNTER — Encounter: Payer: Self-pay | Admitting: Obstetrics and Gynecology

## 2021-02-01 ENCOUNTER — Ambulatory Visit (INDEPENDENT_AMBULATORY_CARE_PROVIDER_SITE_OTHER): Payer: Medicare Other | Admitting: Obstetrics and Gynecology

## 2021-02-01 ENCOUNTER — Other Ambulatory Visit: Payer: Self-pay

## 2021-02-01 VITALS — BP 121/74 | HR 98 | Wt 133.0 lb

## 2021-02-01 DIAGNOSIS — N949 Unspecified condition associated with female genital organs and menstrual cycle: Secondary | ICD-10-CM

## 2021-02-01 DIAGNOSIS — N951 Menopausal and female climacteric states: Secondary | ICD-10-CM

## 2021-02-01 MED ORDER — ESTRADIOL 0.5 MG PO TABS: 1.0000 mg | ORAL_TABLET | Freq: Every day | ORAL | 11 refills | Status: DC
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
## 2021-02-01 NOTE — Progress Notes (Signed)
Pt states she is in office for refill on Estrace, states she has been out for a few weeks.  Pt states she is having hot flashes.

## 2021-02-03 NOTE — Telephone Encounter (Signed)
Patient completed her smoking cessation phone call on 01/18/21. Will close this encounter.

## 2021-02-04 DIAGNOSIS — R109 Unspecified abdominal pain: Secondary | ICD-10-CM | POA: Diagnosis not present

## 2021-02-04 DIAGNOSIS — G894 Chronic pain syndrome: Secondary | ICD-10-CM | POA: Diagnosis not present

## 2021-02-05 ENCOUNTER — Ambulatory Visit: Payer: Medicare Other

## 2021-02-19 ENCOUNTER — Other Ambulatory Visit (HOSPITAL_COMMUNITY)
Admission: RE | Admit: 2021-02-19 | Discharge: 2021-02-19 | Disposition: A | Discharge status: Home / Self Care | Payer: Medicare Other | Attending: Diagnostic Neuroimaging | Admitting: Diagnostic Neuroimaging

## 2021-02-19 DIAGNOSIS — Z79899 Other long term (current) drug therapy: Secondary | ICD-10-CM | POA: Insufficient documentation

## 2021-02-19 DIAGNOSIS — Z5181 Encounter for therapeutic drug level monitoring: Secondary | ICD-10-CM | POA: Diagnosis not present

## 2021-02-19 DIAGNOSIS — H30033 Focal chorioretinal inflammation, peripheral, bilateral: Secondary | ICD-10-CM | POA: Diagnosis present

## 2021-02-19 LAB — CBC WITH DIFFERENTIAL/PLATELET
Abs Immature Granulocytes: 0.02 10*3/uL (ref 0.00–0.07)
Basophils Absolute: 0 10*3/uL (ref 0.0–0.1)
Basophils Relative: 1 %
Eosinophils Absolute: 0.1 10*3/uL (ref 0.0–0.5)
Eosinophils Relative: 2 %
HCT: 39.3 % (ref 36.0–46.0)
Hemoglobin: 13.8 g/dL (ref 12.0–15.0)
Immature Granulocytes: 1 %
Lymphocytes Relative: 36 %
Lymphs Abs: 1.6 10*3/uL (ref 0.7–4.0)
MCH: 37.5 pg — ABNORMAL HIGH (ref 26.0–34.0)
MCHC: 35.1 g/dL (ref 30.0–36.0)
MCV: 106.8 fL — ABNORMAL HIGH (ref 80.0–100.0)
Monocytes Absolute: 0.3 10*3/uL (ref 0.1–1.0)
Monocytes Relative: 8 %
Neutro Abs: 2.3 10*3/uL (ref 1.7–7.7)
Neutrophils Relative %: 52 %
Platelets: 262 10*3/uL (ref 150–400)
RBC: 3.68 MIL/uL — ABNORMAL LOW (ref 3.87–5.11)
RDW: 15.2 % (ref 11.5–15.5)
WBC: 4.4 10*3/uL (ref 4.0–10.5)
nRBC: 0 % (ref 0.0–0.2)

## 2021-02-19 LAB — COMPREHENSIVE METABOLIC PANEL WITH GFR
ALT: 9 U/L (ref 0–44)
AST: 13 U/L — ABNORMAL LOW (ref 15–41)
Albumin: 3.9 g/dL (ref 3.5–5.0)
Alkaline Phosphatase: 62 U/L (ref 38–126)
Anion gap: 7 (ref 5–15)
BUN: 10 mg/dL (ref 8–23)
CO2: 25 mmol/L (ref 22–32)
Calcium: 9.1 mg/dL (ref 8.9–10.3)
Chloride: 104 mmol/L (ref 98–111)
Creatinine, Ser: 0.86 mg/dL (ref 0.44–1.00)
GFR, Estimated: >60 mL/min
Glucose, Bld: 109 mg/dL — ABNORMAL HIGH (ref 70–99)
Potassium: 4.7 mmol/L (ref 3.5–5.1)
Sodium: 136 mmol/L (ref 135–145)
Total Bilirubin: 0.5 mg/dL (ref 0.3–1.2)
Total Protein: 6.5 g/dL (ref 6.5–8.1)

## 2021-02-22 ENCOUNTER — Ambulatory Visit (INDEPENDENT_AMBULATORY_CARE_PROVIDER_SITE_OTHER): Payer: Medicare Other | Admitting: Diagnostic Neuroimaging

## 2021-02-22 ENCOUNTER — Encounter: Payer: Self-pay | Admitting: Diagnostic Neuroimaging

## 2021-02-22 VITALS — BP 114/73 | HR 75 | Ht 66.0 in | Wt 136.2 lb

## 2021-02-22 DIAGNOSIS — G2571 Drug induced akathisia: Secondary | ICD-10-CM

## 2021-02-22 DIAGNOSIS — R451 Restlessness and agitation: Secondary | ICD-10-CM | POA: Diagnosis not present
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
## 2021-02-23 LAB — IRON,TIBC AND FERRITIN PANEL
Ferritin: 84 ng/mL (ref 15–150)
Iron Saturation: 27 % (ref 15–55)
Iron: 73 ug/dL (ref 27–139)
Total Iron Binding Capacity: 275 ug/dL (ref 250–450)
UIBC: 202 ug/dL (ref 118–369)

## 2021-02-23 LAB — VITAMIN B12: Vitamin B-12: 538 pg/mL (ref 232–1245)

## 2021-02-23 LAB — TSH: TSH: 2.43 u[IU]/mL (ref 0.450–4.500)

## 2021-02-25 ENCOUNTER — Telehealth: Payer: Self-pay | Admitting: Internal Medicine

## 2021-02-25 ENCOUNTER — Telehealth: Payer: Self-pay

## 2021-02-25 MED ORDER — QUVIVIQ 25 MG PO TABS: 1.0000 | ORAL_TABLET | Freq: Every day | ORAL | 1 refills | Status: DC

## 2021-02-25 NOTE — Telephone Encounter (Signed)
-----   Message from Penni Bombard, MD sent at 02/25/2021  2:47 PM EST ----- Unremarkable labs. Continue current plan. Please call patient. -VRP

## 2021-02-25 NOTE — Telephone Encounter (Signed)
      Penumalli, Earlean Polka, MD  You 3 minutes ago (3:50 PM)   Ok that is fine. I think it was rx'd by PCP. -VRP

## 2021-02-25 NOTE — Telephone Encounter (Signed)
Lm for patient.  

## 2021-02-25 NOTE — Telephone Encounter (Signed)
Called and spoke with Patient.  Patient stated [name] at [facility] was out of stock.  Patient stated she was contacting [facility] to see when med would be available or  if prescription can be transferred to another local [facility]. Patient stated she would contact office if needed.

## 2021-02-25 NOTE — Telephone Encounter (Signed)
I called the pt and relayed results. Pt verbalized understanding and appreciation for the call.   Pt did ask for MD to be informed that the ropinirole 1 mg is help in treating her RLS at night and would like to continue this medication.  Pt reports she was confused as to which med she was talking about.

## 2021-02-25 NOTE — Telephone Encounter (Signed)
Quviviq sent to her drug store- 1 or 2 at bedtime for sleep if needed

## 2021-02-25 NOTE — Telephone Encounter (Signed)
Pt is requesting to have Quviviq sent in to replace the clonazepam.  CY please advise. thanks

## 2021-03-02 DIAGNOSIS — R109 Unspecified abdominal pain: Secondary | ICD-10-CM | POA: Diagnosis not present

## 2021-03-02 DIAGNOSIS — Z79891 Long term (current) use of opiate analgesic: Secondary | ICD-10-CM | POA: Diagnosis not present

## 2021-03-02 DIAGNOSIS — Z79899 Other long term (current) drug therapy: Secondary | ICD-10-CM | POA: Diagnosis not present

## 2021-03-02 DIAGNOSIS — G894 Chronic pain syndrome: Secondary | ICD-10-CM | POA: Diagnosis not present

## 2021-03-13 ENCOUNTER — Other Ambulatory Visit: Payer: Self-pay | Admitting: Adult Health

## 2021-03-17 ENCOUNTER — Telehealth: Payer: Self-pay | Admitting: Critical Care Medicine

## 2021-03-17 DIAGNOSIS — K117 Disturbances of salivary secretion: Secondary | ICD-10-CM

## 2021-03-17 NOTE — Telephone Encounter (Signed)
Medication Refill - Medication: traZODone (DESYREL) 50 MG tablet Pts former provider was prescribing her 500MG  tablets / when she received her new refill it was for 50MG  / pt stated that dose has not made any difference and she wants to know if she can have refills increased to at least 200MG  or the highest [facility] can prescribe for    Pt also needs refill for amitriptyline (ELAVIL) 100 MG tablet / pt asked if this can be refilled for at least 3 months at a time to have auto refilled from pharmacy   Has the patient contacted their pharmacy? Yes.  About Amitriptyline  (Agent: If no, request that the patient contact the pharmacy for the refill. If patient does not wish to contact the pharmacy document the reason why and proceed with request.) (Agent: If yes, when and what did the pharmacy advise?) no refills call pcp  Preferred Pharmacy (with phone number or street name): [facility] [place] [facility] [address]  [place], [place] [address]  Phone:  [phone]  Fax:  [phone]  Has the patient been seen for an appointment in the last year OR does the patient have an upcoming appointment? Yes.    Agent: Please be advised that RX refills may take up to 3 business days. We ask that you follow-up with your pharmacy.

## 2021-03-18 ENCOUNTER — Telehealth: Payer: Self-pay | Admitting: Critical Care Medicine

## 2021-03-18 DIAGNOSIS — Z1389 Encounter for screening for other disorder: Secondary | ICD-10-CM

## 2021-03-18 MED ORDER — AMITRIPTYLINE HCL 100 MG PO TABS: ORAL_TABLET | ORAL | 0 refills | Status: DC

## 2021-03-18 NOTE — Telephone Encounter (Signed)
Copied from [place] [id number]. Topic: Referral - Request for Referral >> Mar 15, 2021  3:33 PM Alanda Slim E wrote: Has patient seen PCP for this complaint? Dr. Margarita Rana / Not sure  *If NO, is insurance requiring patient see PCP for this issue before PCP can refer them? Referral for which specialty: Dermatologist  Preferred provider/office:  Reason for referral: Mole on neck and elbow that need to be looked at

## 2021-03-18 NOTE — Telephone Encounter (Signed)
Pt is seeking referral to dermatology

## 2021-03-18 NOTE — Telephone Encounter (Signed)
Dr. Margarita Rana,   This patient is requesting a dose change in her trazodone. I explained that we do not have a 500 mg tablet strength of trazodone. She tells me should would like to be increased d/t lack of efficacy with the 50 mg tablet. She has not been seen by you in some time (01/09/20). She was seen by Levada Dy in September of this year. However, I told her I would forward this request to you.   I already sent her refill requested for amitriptyline.

## 2021-03-18 NOTE — Telephone Encounter (Signed)
That was the same issue she raised with me and she subsequently requested a change in PCP and was assigned to Dr. Joya Gaskins.  Unfortunately she will need to address this with her new PCP

## 2021-03-19 ENCOUNTER — Other Ambulatory Visit: Payer: Self-pay | Admitting: Physician Assistant

## 2021-03-19 DIAGNOSIS — K117 Disturbances of salivary secretion: Secondary | ICD-10-CM

## 2021-03-19 NOTE — Telephone Encounter (Signed)
I have yet to see this patient, I think she was with dr Margarita Rana but I have not seen her yet, can you call pt and ask what the issue is and so I can make a better referrla

## 2021-03-19 NOTE — Telephone Encounter (Signed)
Called pt and left vm to give a call back

## 2021-03-22 ENCOUNTER — Other Ambulatory Visit: Payer: Self-pay | Admitting: Diagnostic Neuroimaging

## 2021-03-22 MED ORDER — ROPINIROLE HCL 1 MG PO TABS: 1.0000 mg | ORAL_TABLET | Freq: Three times a day (TID) | ORAL | 3 refills | Status: DC

## 2021-03-22 NOTE — Telephone Encounter (Signed)
Pt called was told one she is running out of the rOPINIRole (REQUIP) 1 MG tablet to give [place] a call to refill it. Pt is out of it. [place], [place]

## 2021-03-22 NOTE — Telephone Encounter (Signed)
Called patient and informed her the PCP can prescribe ropinirole. She stated PCP prescribed 0.5 mg qhs years ago. She stated it wasn't enough. She talked to her pain management dr who prescribed 1 mg 3 x day, but he is not supposed to prescribe anything but pain meds.  She's getting new PCP in March and would like refill to last until she sees new PCP, [name]. I advised will send request to Dr Leta Baptist. Patient verbalized understanding, appreciation.

## 2021-03-23 NOTE — Addendum Note (Signed)
Addended by: Elsie Stain on: 03/23/2021 06:25 PM   Modules accepted: Orders

## 2021-03-23 NOTE — Telephone Encounter (Signed)
Referral to [facility] dermatology made

## 2021-03-24 NOTE — Telephone Encounter (Signed)
Called pt and she is aware  ?

## 2021-03-26 ENCOUNTER — Telehealth: Payer: Self-pay

## 2021-03-26 NOTE — Telephone Encounter (Signed)
Patient was contacted for smoking cessation follow-up. Unable to reach patient or leave voicemail. Will try to contact two more times until patient must be re-referred to the telephone-based smoking cessation program.   Joseph Art, Pharm.D. PGY-1 Pharmacy Resident 03/26/2021 2:44 PM

## 2021-04-01 DIAGNOSIS — Z79891 Long term (current) use of opiate analgesic: Secondary | ICD-10-CM | POA: Diagnosis not present

## 2021-04-01 DIAGNOSIS — R109 Unspecified abdominal pain: Secondary | ICD-10-CM | POA: Diagnosis not present

## 2021-04-01 DIAGNOSIS — G894 Chronic pain syndrome: Secondary | ICD-10-CM | POA: Diagnosis not present

## 2021-04-01 DIAGNOSIS — Z79899 Other long term (current) drug therapy: Secondary | ICD-10-CM | POA: Diagnosis not present

## 2021-04-02 DIAGNOSIS — Z961 Presence of intraocular lens: Secondary | ICD-10-CM | POA: Diagnosis not present

## 2021-04-02 DIAGNOSIS — H30033 Focal chorioretinal inflammation, peripheral, bilateral: Secondary | ICD-10-CM | POA: Diagnosis not present

## 2021-04-02 DIAGNOSIS — H35372 Puckering of macula, left eye: Secondary | ICD-10-CM | POA: Diagnosis not present

## 2021-04-02 DIAGNOSIS — Z79899 Other long term (current) drug therapy: Secondary | ICD-10-CM | POA: Diagnosis not present

## 2021-04-08 ENCOUNTER — Telehealth: Payer: Self-pay | Admitting: Internal Medicine

## 2021-04-09 MED ORDER — CLONAZEPAM 0.5 MG PO TABS: ORAL_TABLET | ORAL | 5 refills | Status: DC

## 2021-04-09 NOTE — Telephone Encounter (Signed)
Called and spoke with pt letting her know that [facility] refilled her clonazepam Rx and she verbalized understanding. Nothing further needed.

## 2021-04-09 NOTE — Telephone Encounter (Signed)
Dr. Annamaria Boots, please advise on this for pt.  Allergies  Allergen Reactions   Haloperidol Decanoate Anaphylaxis   Oxybutynin Chloride Other (See Comments)    TROUBLE SWALLOWING   Tape     Adhesive tape--itching    Current Outpatient Medications:    albuterol (VENTOLIN HFA) 108 (90 Base) MCG/ACT inhaler, INHALE 2 PUFFS INTO THE LUNGS EVERY 6 HOURS AS NEEDED FOR WHEEZING OR SHORTNESS OF BREATH, Disp: 54 g, Rfl: 0   amitriptyline (ELAVIL) 100 MG tablet, TAKE 1 TABLET(100 MG) BY MOUTH AT BEDTIME, Disp: 90 tablet, Rfl: 0   Cholecalciferol (VITAMIN D) 50 MCG (2000 UT) tablet, Take 2,000 Units by mouth daily., Disp: , Rfl:    clonazePAM (KLONOPIN) 0.5 MG tablet, TAKE 1 TO 2 TABLETS BY MOUTH AT BEDTIME AS NEEDED FOR SLEEP, Disp: 30 tablet, Rfl: 5   cycloSPORINE (RESTASIS) 0.05 % ophthalmic emulsion, Place 1 drop into both eyes daily., Disp: , Rfl:    Daridorexant HCl (QUVIVIQ) 25 MG TABS, Take 1 tablet by mouth at bedtime., Disp: 30 tablet, Rfl: 1   estradiol (ESTRACE) 1 MG tablet, Take 1 tablet (1 mg total) by mouth daily., Disp: 30 tablet, Rfl: 6   HYDROcodone-acetaminophen (NORCO) 10-325 MG tablet, 02/22/21 takes 4 x day, Disp: , Rfl:    lamoTRIgine (LAMICTAL) 100 MG tablet, TAKE 1 TABLET BY MOUTH EVERY MORNING AND 2 TABLETS EVERY EVENING, Disp: 90 tablet, Rfl: 5   Multiple Vitamin (MULTIVITAMIN WITH MINERALS) TABS tablet, Take 1 tablet by mouth daily after breakfast. Centrum Women's 50+, Disp: , Rfl:    prednisoLONE acetate (PRED FORTE) 1 % ophthalmic suspension, Place 1 drop into the left eye 4 times daily. No refills until PT is seen in clinic., Disp: , Rfl:    rOPINIRole (REQUIP) 1 MG tablet, Take 1 tablet (1 mg total) by mouth 3 (three) times daily., Disp: 90 tablet, Rfl: 3   simvastatin (ZOCOR) 40 MG tablet, TAKE 1 TABLET BY MOUTH EVERY DAY IN THE EVENING, Disp: 90 tablet, Rfl: 1   traZODone (DESYREL) 50 MG tablet, Take 0.5-1 tablets (25-50 mg total) by mouth at bedtime as needed for sleep.  (Patient not taking: No sig reported), Disp: 30 tablet, Rfl: 3

## 2021-04-09 NOTE — Telephone Encounter (Signed)
Clonazepam refilled at [facility]

## 2021-04-26 ENCOUNTER — Other Ambulatory Visit: Payer: Self-pay | Admitting: Physician Assistant

## 2021-04-26 DIAGNOSIS — G4700 Insomnia, unspecified: Secondary | ICD-10-CM

## 2021-04-26 NOTE — Telephone Encounter (Signed)
Requested Prescriptions  Pending Prescriptions Disp Refills   traZODone (DESYREL) 50 MG tablet [Pharmacy Med Name: TRAZODONE 50MG  TABLETS] 30 tablet 3    Sig: TAKE 1/2 TO 1 TABLET(25 TO 50 MG) BY MOUTH AT BEDTIME AS NEEDED FOR SLEEP     Psychiatry: Antidepressants - Serotonin Modulator Passed - 04/26/2021 11:07 AM      Passed - Completed PHQ-2 or PHQ-9 in the last 360 days      Passed - Valid encounter within last 6 months    Recent Outpatient Visits          4 months ago Restless legs syndrome   [place], [place], [place]   1 year ago [place], Charlane Ferretti, MD   1 year ago Other constipation   [place], [place], MD   1 year ago Other constipation   [place], [place]ong, MD   1 year ago Acute pain of left shoulder   [place], En[place]ong, MD      Future Appointments            In 1 month Wynetta Emery, Dalbert Batman, MD [place]

## 2021-04-28 ENCOUNTER — Other Ambulatory Visit: Payer: Self-pay | Admitting: Primary Care

## 2021-04-28 DIAGNOSIS — R0609 Other forms of dyspnea: Secondary | ICD-10-CM

## 2021-05-03 DIAGNOSIS — R109 Unspecified abdominal pain: Secondary | ICD-10-CM | POA: Diagnosis not present

## 2021-05-03 DIAGNOSIS — G894 Chronic pain syndrome: Secondary | ICD-10-CM | POA: Diagnosis not present

## 2021-05-15 ENCOUNTER — Other Ambulatory Visit: Payer: Self-pay | Admitting: Family Medicine

## 2021-05-15 DIAGNOSIS — K117 Disturbances of salivary secretion: Secondary | ICD-10-CM

## 2021-05-15 NOTE — Telephone Encounter (Signed)
Requested Prescriptions  Pending Prescriptions Disp Refills   amitriptyline (ELAVIL) 100 MG tablet [Pharmacy Med Name: AMITRIPTYLINE 100MG  (HUNDRED MG)TAB] 90 tablet 0    Sig: TAKE 1 TABLET(100 MG) BY MOUTH AT BEDTIME     Psychiatry:  Antidepressants - Heterocyclics (TCAs) Passed - 05/15/2021 11:29 AM      Passed - Completed PHQ-2 or PHQ-9 in the last 360 days      Passed - Valid encounter within last 6 months    Recent Outpatient Visits          4 months ago Restless legs syndrome   [place], [place], [place]   1 year ago [place], Charlane Ferretti, MD   1 year ago Other constipation   [place], Enobong, MD   1 year ago Other constipation   [place], Enobong, MD   1 year ago Acute pain of left shoulder   [place], Enobong, MD      Future Appointments            In 1 month Wynetta Emery, Dalbert Batman, MD [place]

## 2021-05-18 ENCOUNTER — Other Ambulatory Visit: Payer: Self-pay | Admitting: Physician Assistant

## 2021-05-28 DIAGNOSIS — J343 Hypertrophy of nasal turbinates: Secondary | ICD-10-CM | POA: Diagnosis not present

## 2021-05-28 DIAGNOSIS — J3489 Other specified disorders of nose and nasal sinuses: Secondary | ICD-10-CM | POA: Diagnosis not present

## 2021-05-28 DIAGNOSIS — K219 Gastro-esophageal reflux disease without esophagitis: Secondary | ICD-10-CM | POA: Diagnosis not present

## 2021-05-28 DIAGNOSIS — Z87891 Personal history of nicotine dependence: Secondary | ICD-10-CM | POA: Diagnosis not present

## 2021-05-28 DIAGNOSIS — J342 Deviated nasal septum: Secondary | ICD-10-CM | POA: Diagnosis not present

## 2021-06-08 ENCOUNTER — Ambulatory Visit
Admission: RE | Admit: 2021-06-08 | Discharge: 2021-06-08 | Disposition: A | Discharge status: Home / Self Care | Payer: Medicare Other | Source: Ambulatory Visit | Attending: Family Medicine | Admitting: Family Medicine

## 2021-06-08 ENCOUNTER — Other Ambulatory Visit: Payer: Self-pay

## 2021-06-08 DIAGNOSIS — Z780 Asymptomatic menopausal state: Secondary | ICD-10-CM | POA: Diagnosis not present

## 2021-06-08 DIAGNOSIS — Z1231 Encounter for screening mammogram for malignant neoplasm of breast: Secondary | ICD-10-CM | POA: Diagnosis not present

## 2021-06-08 DIAGNOSIS — M8589 Other specified disorders of bone density and structure, multiple sites: Secondary | ICD-10-CM | POA: Diagnosis not present

## 2021-06-08 DIAGNOSIS — E2839 Other primary ovarian failure: Secondary | ICD-10-CM

## 2021-06-09 ENCOUNTER — Telehealth: Payer: Self-pay | Admitting: Primary Care

## 2021-06-09 ENCOUNTER — Encounter: Payer: Self-pay | Admitting: Primary Care

## 2021-06-09 ENCOUNTER — Ambulatory Visit (INDEPENDENT_AMBULATORY_CARE_PROVIDER_SITE_OTHER): Payer: Medicare Other | Admitting: Primary Care

## 2021-06-09 DIAGNOSIS — G4700 Insomnia, unspecified: Secondary | ICD-10-CM

## 2021-06-09 DIAGNOSIS — G4733 Obstructive sleep apnea (adult) (pediatric): Secondary | ICD-10-CM

## 2021-06-09 DIAGNOSIS — J449 Chronic obstructive pulmonary disease, unspecified: Secondary | ICD-10-CM | POA: Diagnosis not present

## 2021-06-09 MED ORDER — CLONAZEPAM 1 MG PO TABS: ORAL_TABLET | ORAL | 0 refills | Status: DC

## 2021-06-09 MED ORDER — SPIRIVA RESPIMAT 2.5 MCG/ACT IN AERS: 2.0000 | INHALATION_SPRAY | Freq: Every day | RESPIRATORY_TRACT | 0 refills | Status: DC

## 2021-06-09 MED ORDER — CLONAZEPAM 0.5 MG PO TABS: ORAL_TABLET | ORAL | 0 refills | Status: DC

## 2021-06-09 NOTE — Telephone Encounter (Signed)
error 

## 2021-06-09 NOTE — Patient Instructions (Signed)
I will get back to you will recommendations for insomnia, for now continue klonopin 1mg  at bedtime  Recommendations: Start Spiriva respimat- 2 puffs daily every morning Continue Albuterol 2 puffs every 4-6 hours for breakthrough shortness of breath/wheezing   Follow-up: 1 month with Eustaquio Maize NP
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
## 2021-06-11 ENCOUNTER — Telehealth: Payer: Self-pay

## 2021-06-11 ENCOUNTER — Ambulatory Visit: Payer: Self-pay | Admitting: *Deleted

## 2021-06-11 NOTE — Telephone Encounter (Signed)
Contacted pt to go over lab results pt didn't answer lvm   Sent a CRM and forward labs to NT to give pt labs when they call back   

## 2021-06-11 NOTE — Telephone Encounter (Signed)
Opened chart to give lab results however I let the agent know these lab results are from a neurology practice not [facility].  I let her know to have pt call [facility] for the lab results.

## 2021-06-14 DIAGNOSIS — H30033 Focal chorioretinal inflammation, peripheral, bilateral: Secondary | ICD-10-CM | POA: Diagnosis not present

## 2021-06-14 DIAGNOSIS — Z961 Presence of intraocular lens: Secondary | ICD-10-CM | POA: Diagnosis not present

## 2021-06-19 ENCOUNTER — Other Ambulatory Visit: Payer: Self-pay | Admitting: Physician Assistant

## 2021-06-19 DIAGNOSIS — E782 Mixed hyperlipidemia: Secondary | ICD-10-CM

## 2021-06-21 NOTE — Telephone Encounter (Signed)
Requested Prescriptions  ?Pending Prescriptions Disp Refills  ?? simvastatin (ZOCOR) 40 MG tablet [Pharmacy Med Name: SIMVASTATIN '40MG'$  TABLETS] 90 tablet 1  ?  Sig: TAKE 1 TABLET BY MOUTH EVERY DAY IN THE EVENING  ?  ? Cardiovascular:  Antilipid - Statins Failed - 06/19/2021  5:38 PM  ?  ?  Failed - Lipid Panel in normal range within the last 12 months  ?  Cholesterol, Total  ?Date Value Ref Range Status  ?12/23/2020 157 100 - 199 mg/dL Final  ? ?LDL Chol Calc (NIH)  ?Date Value Ref Range Status  ?12/23/2020 87 0 - 99 mg/dL Final  ? ?Direct LDL  ?Date Value Ref Range Status  ?05/29/2006 160 mg/dL   ? ?HDL  ?Date Value Ref Range Status  ?12/23/2020 43 >39 mg/dL Final  ? ?Triglycerides  ?Date Value Ref Range Status  ?12/23/2020 157 (H) 0 - 149 mg/dL Final  ? ?  ?  ?  Passed - Patient is not pregnant  ?  ?  Passed - Valid encounter within last 12 months  ?  Recent Outpatient Visits   ?      ? 6 months ago Restless legs syndrome  ? [place], [place], [place]  ? 1 year ago Sialorrhea  ? [place], [place], [place]  ? 1 year ago Other constipation  ? [place], Enobong, [place]  ? 1 year ago Other constipation  ? [place], Charlane Ferretti, MD  ? 1 year ago Acute pain of left shoulder  ? [place] Charlott Rakes, MD  ?  ?  ?Future Appointments   ?        ? Tomorrow Ladell Pier, MD [place]  ? In 2 weeks Martyn Ehrich, NP [facility]  ?  ? ?  ?  ?  ? ? ?

## 2021-06-22 ENCOUNTER — Other Ambulatory Visit: Payer: Self-pay

## 2021-06-22 ENCOUNTER — Ambulatory Visit: Payer: Medicare Other | Admitting: Family Medicine

## 2021-06-22 ENCOUNTER — Ambulatory Visit: Payer: Medicare Other | Attending: Internal Medicine | Admitting: Internal Medicine

## 2021-06-22 ENCOUNTER — Encounter: Payer: Self-pay | Admitting: Internal Medicine

## 2021-06-22 VITALS — BP 109/79 | HR 77 | Ht 66.0 in | Wt 145.0 lb

## 2021-06-22 DIAGNOSIS — Z79891 Long term (current) use of opiate analgesic: Secondary | ICD-10-CM | POA: Diagnosis not present

## 2021-06-22 DIAGNOSIS — J449 Chronic obstructive pulmonary disease, unspecified: Secondary | ICD-10-CM | POA: Diagnosis not present

## 2021-06-22 DIAGNOSIS — D2261 Melanocytic nevi of right upper limb, including shoulder: Secondary | ICD-10-CM | POA: Diagnosis not present

## 2021-06-22 DIAGNOSIS — G2581 Restless legs syndrome: Secondary | ICD-10-CM | POA: Diagnosis not present

## 2021-06-22 DIAGNOSIS — M62830 Muscle spasm of back: Secondary | ICD-10-CM | POA: Insufficient documentation

## 2021-06-22 DIAGNOSIS — D229 Melanocytic nevi, unspecified: Secondary | ICD-10-CM | POA: Diagnosis not present

## 2021-06-22 DIAGNOSIS — G4733 Obstructive sleep apnea (adult) (pediatric): Secondary | ICD-10-CM | POA: Diagnosis not present

## 2021-06-22 DIAGNOSIS — M160 Bilateral primary osteoarthritis of hip: Secondary | ICD-10-CM | POA: Insufficient documentation

## 2021-06-22 DIAGNOSIS — F439 Reaction to severe stress, unspecified: Secondary | ICD-10-CM | POA: Insufficient documentation

## 2021-06-22 DIAGNOSIS — Z7951 Long term (current) use of inhaled steroids: Secondary | ICD-10-CM | POA: Insufficient documentation

## 2021-06-22 DIAGNOSIS — E782 Mixed hyperlipidemia: Secondary | ICD-10-CM | POA: Diagnosis not present

## 2021-06-22 DIAGNOSIS — G4709 Other insomnia: Secondary | ICD-10-CM | POA: Diagnosis not present

## 2021-06-22 DIAGNOSIS — D2262 Melanocytic nevi of left upper limb, including shoulder: Secondary | ICD-10-CM | POA: Insufficient documentation

## 2021-06-22 DIAGNOSIS — Z79899 Other long term (current) drug therapy: Secondary | ICD-10-CM | POA: Insufficient documentation

## 2021-06-22 DIAGNOSIS — G40909 Epilepsy, unspecified, not intractable, without status epilepticus: Secondary | ICD-10-CM | POA: Diagnosis not present

## 2021-06-22 DIAGNOSIS — F17210 Nicotine dependence, cigarettes, uncomplicated: Secondary | ICD-10-CM | POA: Diagnosis not present

## 2021-06-22 DIAGNOSIS — F329 Major depressive disorder, single episode, unspecified: Secondary | ICD-10-CM | POA: Insufficient documentation

## 2021-06-22 DIAGNOSIS — Z23 Encounter for immunization: Secondary | ICD-10-CM | POA: Insufficient documentation

## 2021-06-22 MED ORDER — ZOSTER VAC RECOMB ADJUVANTED 50 MCG/0.5ML IM SUSR: 0.5000 mL | Freq: Once | INTRAMUSCULAR | 0 refills | Status: AC

## 2021-06-22 MED ORDER — CYCLOBENZAPRINE HCL 5 MG PO TABS: 5.0000 mg | ORAL_TABLET | Freq: Every day | ORAL | 0 refills | Status: DC | PRN
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
## 2021-06-23 DIAGNOSIS — Z23 Encounter for immunization: Secondary | ICD-10-CM | POA: Diagnosis not present

## 2021-06-30 DIAGNOSIS — G894 Chronic pain syndrome: Secondary | ICD-10-CM | POA: Diagnosis not present

## 2021-06-30 DIAGNOSIS — Z79891 Long term (current) use of opiate analgesic: Secondary | ICD-10-CM | POA: Diagnosis not present

## 2021-07-07 ENCOUNTER — Ambulatory Visit: Payer: Medicare Other | Admitting: Primary Care

## 2021-07-09 ENCOUNTER — Other Ambulatory Visit: Payer: Self-pay

## 2021-07-09 ENCOUNTER — Encounter: Payer: Self-pay | Admitting: Primary Care

## 2021-07-09 ENCOUNTER — Ambulatory Visit (INDEPENDENT_AMBULATORY_CARE_PROVIDER_SITE_OTHER): Payer: Medicare Other | Admitting: Primary Care

## 2021-07-09 DIAGNOSIS — G4733 Obstructive sleep apnea (adult) (pediatric): Secondary | ICD-10-CM | POA: Diagnosis not present

## 2021-07-09 DIAGNOSIS — R0609 Other forms of dyspnea: Secondary | ICD-10-CM | POA: Diagnosis not present

## 2021-07-09 DIAGNOSIS — G4700 Insomnia, unspecified: Secondary | ICD-10-CM

## 2021-07-09 DIAGNOSIS — J449 Chronic obstructive pulmonary disease, unspecified: Secondary | ICD-10-CM

## 2021-07-09 MED ORDER — ALBUTEROL SULFATE HFA 108 (90 BASE) MCG/ACT IN AERS: 2.0000 | INHALATION_SPRAY | Freq: Four times a day (QID) | RESPIRATORY_TRACT | 2 refills | Status: DC | PRN

## 2021-07-09 MED ORDER — CLONAZEPAM 1 MG PO TABS: ORAL_TABLET | ORAL | 0 refills | Status: DC

## 2021-07-09 MED ORDER — SPIRIVA RESPIMAT 2.5 MCG/ACT IN AERS: 2.0000 | INHALATION_SPRAY | Freq: Every day | RESPIRATORY_TRACT | 0 refills | Status: DC
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
## 2021-07-09 NOTE — Patient Instructions (Addendum)
Recommendations: ?- Continue Spiriva respimat - take two puffs daily in the morning ?- Decreased Klonopin to '1mg'$  at bedtime ?- Continue to work on smoking cessation (taper amount you are smoking and when ready pick quit date) ?- Refilling ventolin inhaler  ? ?Orders: ?- Please give sample Spiriva 2.45mg (RX sent) ? ?Follow-up: ?- 3 months with Dr. YAnnamaria Bootsor BEustaquio MaizeNP ?

## 2021-07-13 ENCOUNTER — Telehealth: Payer: Self-pay | Admitting: Orthopaedic Surgery

## 2021-07-13 NOTE — Telephone Encounter (Signed)
Pt called and states that she needs a new card that tells the people at the airport she had a hip replacement.  ?

## 2021-07-14 NOTE — Telephone Encounter (Signed)
She would like this mailed out. I will place this is in the mail. ? ?

## 2021-07-16 DIAGNOSIS — G4700 Insomnia, unspecified: Secondary | ICD-10-CM | POA: Insufficient documentation

## 2021-07-16 NOTE — Assessment & Plan Note (Signed)
-   Improved; She has less stress at home. Plans on leaving current living situation. She would like to resume lower dose Klonopin '1mg'$  qhs.  ?

## 2021-07-16 NOTE — Assessment & Plan Note (Signed)
-   Stable; No acute respiratory symptoms. Having difficulty figuring out how to use Spiriva Respimat inhaler. Reviewed use today in office.  ?

## 2021-07-16 NOTE — Assessment & Plan Note (Signed)
-   HST 09/18/19 >> AHI 11.2/hr. Following with orthodontics. Recently received oral appliance, having difficulty with fitting- will contact Dr. Ron Parker  ?

## 2021-07-20 ENCOUNTER — Other Ambulatory Visit: Payer: Self-pay | Admitting: Critical Care Medicine

## 2021-07-20 DIAGNOSIS — G4700 Insomnia, unspecified: Secondary | ICD-10-CM

## 2021-08-01 DIAGNOSIS — Z79899 Other long term (current) drug therapy: Secondary | ICD-10-CM | POA: Diagnosis not present

## 2021-08-01 DIAGNOSIS — Z6822 Body mass index (BMI) 22.0-22.9, adult: Secondary | ICD-10-CM | POA: Diagnosis not present

## 2021-08-01 DIAGNOSIS — M545 Low back pain, unspecified: Secondary | ICD-10-CM | POA: Diagnosis not present

## 2021-08-01 DIAGNOSIS — Z0130 Encounter for examination of blood pressure without abnormal findings: Secondary | ICD-10-CM | POA: Diagnosis not present

## 2021-08-01 DIAGNOSIS — G4700 Insomnia, unspecified: Secondary | ICD-10-CM | POA: Diagnosis not present

## 2021-08-01 DIAGNOSIS — Z1339 Encounter for screening examination for other mental health and behavioral disorders: Secondary | ICD-10-CM | POA: Diagnosis not present

## 2021-08-01 DIAGNOSIS — F32A Depression, unspecified: Secondary | ICD-10-CM | POA: Diagnosis not present

## 2021-08-01 DIAGNOSIS — F17210 Nicotine dependence, cigarettes, uncomplicated: Secondary | ICD-10-CM | POA: Diagnosis not present

## 2021-08-01 DIAGNOSIS — Z1331 Encounter for screening for depression: Secondary | ICD-10-CM | POA: Diagnosis not present

## 2021-08-01 DIAGNOSIS — F411 Generalized anxiety disorder: Secondary | ICD-10-CM | POA: Diagnosis not present

## 2021-08-04 DIAGNOSIS — Z79891 Long term (current) use of opiate analgesic: Secondary | ICD-10-CM | POA: Diagnosis not present

## 2021-08-04 DIAGNOSIS — M545 Low back pain, unspecified: Secondary | ICD-10-CM | POA: Diagnosis not present

## 2021-08-04 DIAGNOSIS — Z79899 Other long term (current) drug therapy: Secondary | ICD-10-CM | POA: Diagnosis not present

## 2021-08-04 DIAGNOSIS — R030 Elevated blood-pressure reading, without diagnosis of hypertension: Secondary | ICD-10-CM | POA: Diagnosis not present

## 2021-08-04 DIAGNOSIS — Z6822 Body mass index (BMI) 22.0-22.9, adult: Secondary | ICD-10-CM | POA: Diagnosis not present

## 2021-08-09 ENCOUNTER — Other Ambulatory Visit: Payer: Self-pay | Admitting: Primary Care

## 2021-08-10 ENCOUNTER — Telehealth: Payer: Self-pay | Admitting: Primary Care

## 2021-08-10 NOTE — Assessment & Plan Note (Signed)
-   Having difficulty sleeping, takes her on average 2 hours to fall asleep. Recommend increasing Klonopin 1.'5mg'$  qhs  ?

## 2021-08-10 NOTE — Assessment & Plan Note (Signed)
-   HST in June 2021 showed mild OSA, AHI 11.2. She is intolerant to CPAP, following with Dr. Ron Parker. Receiving oral appliance in March  ?

## 2021-08-10 NOTE — Assessment & Plan Note (Signed)
-   Experiencing chest tightness at night. Using SABA without much relief. PFTs in Nov 2021 showed mild obstruction, no BD response/ fev1 77%, ratio 71. Recommend starting Spiriva respimat 2.68mg and continue Albuterol 2 puffs every 4-6 hours for breakthrough shortness of breath/wheezing  ?

## 2021-08-10 NOTE — Telephone Encounter (Signed)
Please advise on med refill. ? ? ?Allergies  ?Allergen Reactions  ? Haloperidol Decanoate Anaphylaxis  ? Oxybutynin Chloride Other (See Comments)  ?  TROUBLE SWALLOWING  ? Tape   ?  Adhesive tape--itching  ? ? ? ?Current Outpatient Medications:  ?  albuterol (VENTOLIN HFA) 108 (90 Base) MCG/ACT inhaler, Inhale 2 puffs into the lungs every 6 (six) hours as needed for wheezing or shortness of breath., Disp: 54 g, Rfl: 2 ?  Cholecalciferol (VITAMIN D) 50 MCG (2000 UT) tablet, Take 2,000 Units by mouth daily., Disp: , Rfl:  ?  clonazePAM (KLONOPIN) 1 MG tablet, Take 1 tablet at bedtime, Disp: 30 tablet, Rfl: 0 ?  cycloSPORINE (RESTASIS) 0.05 % ophthalmic emulsion, Place 1 drop into both eyes daily., Disp: , Rfl:  ?  estradiol (ESTRACE) 1 MG tablet, Take 1 tablet (1 mg total) by mouth daily., Disp: 30 tablet, Rfl: 6 ?  HYDROcodone-acetaminophen (NORCO) 10-325 MG tablet, 02/22/21 takes 4 x day, Disp: , Rfl:  ?  lamoTRIgine (LAMICTAL) 100 MG tablet, TAKE 1 TABLET BY MOUTH EVERY MORNING AND 2 TABLETS EVERY EVENING, Disp: 90 tablet, Rfl: 5 ?  Multiple Vitamin (MULTIVITAMIN WITH MINERALS) TABS tablet, Take 1 tablet by mouth daily after breakfast. Centrum Women's 50+, Disp: , Rfl:  ?  rOPINIRole (REQUIP) 1 MG tablet, Take 1 tablet (1 mg total) by mouth 3 (three) times daily., Disp: 90 tablet, Rfl: 3 ?  simvastatin (ZOCOR) 40 MG tablet, TAKE 1 TABLET BY MOUTH EVERY DAY IN THE EVENING, Disp: 90 tablet, Rfl: 1 ?  Tiotropium Bromide Monohydrate (SPIRIVA RESPIMAT) 2.5 MCG/ACT AERS, Inhale 2 puffs into the lungs daily., Disp: 4 g, Rfl: 0 ?  Tiotropium Bromide Monohydrate (SPIRIVA RESPIMAT) 2.5 MCG/ACT AERS, Inhale 2 puffs into the lungs daily., Disp: 4 g, Rfl: 0 ? ?

## 2021-08-10 NOTE — Telephone Encounter (Signed)
Refill sent.

## 2021-08-11 NOTE — Telephone Encounter (Signed)
Called and spoke with pt letting her know that [facility] refilled her clonazepam and she verbalized understanding. ? ?While speaking with pt, she said that the Spiriva is making her cough and she does not like it and is requesting to have a different inhaler prescribed. ? ?Dr. Annamaria Boots, please advise. ? ? ?Allergies  ?Allergen Reactions  ? Haloperidol Decanoate Anaphylaxis  ? Oxybutynin Chloride Other (See Comments)  ?  TROUBLE SWALLOWING  ? Tape   ?  Adhesive tape--itching  ? ? ? ?Current Outpatient Medications:  ?  albuterol (VENTOLIN HFA) 108 (90 Base) MCG/ACT inhaler, Inhale 2 puffs into the lungs every 6 (six) hours as needed for wheezing or shortness of breath., Disp: 54 g, Rfl: 2 ?  Cholecalciferol (VITAMIN D) 50 MCG (2000 UT) tablet, Take 2,000 Units by mouth daily., Disp: , Rfl:  ?  clonazePAM (KLONOPIN) 1 MG tablet, TAKE 1 TABLET BY MOUTH AT BEDTIME, Disp: 30 tablet, Rfl: 5 ?  cycloSPORINE (RESTASIS) 0.05 % ophthalmic emulsion, Place 1 drop into both eyes daily., Disp: , Rfl:  ?  estradiol (ESTRACE) 1 MG tablet, Take 1 tablet (1 mg total) by mouth daily., Disp: 30 tablet, Rfl: 6 ?  HYDROcodone-acetaminophen (NORCO) 10-325 MG tablet, 02/22/21 takes 4 x day, Disp: , Rfl:  ?  lamoTRIgine (LAMICTAL) 100 MG tablet, TAKE 1 TABLET BY MOUTH EVERY MORNING AND 2 TABLETS EVERY EVENING, Disp: 90 tablet, Rfl: 5 ?  Multiple Vitamin (MULTIVITAMIN WITH MINERALS) TABS tablet, Take 1 tablet by mouth daily after breakfast. Centrum Women's 50+, Disp: , Rfl:  ?  rOPINIRole (REQUIP) 1 MG tablet, Take 1 tablet (1 mg total) by mouth 3 (three) times daily., Disp: 90 tablet, Rfl: 3 ?  simvastatin (ZOCOR) 40 MG tablet, TAKE 1 TABLET BY MOUTH EVERY DAY IN THE EVENING, Disp: 90 tablet, Rfl: 1 ?  Tiotropium Bromide Monohydrate (SPIRIVA RESPIMAT) 2.5 MCG/ACT AERS, Inhale 2 puffs into the lungs daily., Disp: 4 g, Rfl: 0 ?  Tiotropium Bromide Monohydrate (SPIRIVA RESPIMAT) 2.5 MCG/ACT AERS, Inhale 2 puffs into the lungs daily., Disp: 4 g, Rfl: 0 ? ?

## 2021-08-12 DIAGNOSIS — R1084 Generalized abdominal pain: Secondary | ICD-10-CM | POA: Diagnosis not present

## 2021-08-12 DIAGNOSIS — G890 Central pain syndrome: Secondary | ICD-10-CM | POA: Diagnosis not present

## 2021-08-12 DIAGNOSIS — G894 Chronic pain syndrome: Secondary | ICD-10-CM | POA: Diagnosis not present

## 2021-08-12 DIAGNOSIS — Z79891 Long term (current) use of opiate analgesic: Secondary | ICD-10-CM | POA: Diagnosis not present

## 2021-08-12 DIAGNOSIS — N3010 Interstitial cystitis (chronic) without hematuria: Secondary | ICD-10-CM | POA: Diagnosis not present

## 2021-08-12 NOTE — Telephone Encounter (Signed)
No samples of incruse. Do you want me to send Rx to pharmacy for pt to see if it is covered? ?

## 2021-08-12 NOTE — Telephone Encounter (Signed)
Yes thanks 

## 2021-08-12 NOTE — Telephone Encounter (Signed)
Attempted to call pt but line went directly to VM. Left message for her to return call. °

## 2021-08-12 NOTE — Telephone Encounter (Signed)
Do we a have a couple of samples of Incruse she could try instead of Spiriva- inhale 1 puff, once daily ?

## 2021-08-19 ENCOUNTER — Ambulatory Visit: Payer: Medicare Other | Admitting: Adult Health

## 2021-08-26 NOTE — Telephone Encounter (Signed)
Pt calling back wanted Beth to let Dr. Annamaria Boots know she has been taking clonazepam.  Asked to be switched to something else.  Had tried QuVivic but made her groggy and now back on clonazepam.  Please advise.  [phone] ?

## 2021-08-26 NOTE — Telephone Encounter (Signed)
Called and spoke with patient. She stated that she has decided to not use the Spiriva 2.37mg inhaler. It causes her to cough more. She is just using the albuterol as needed now.  ? ?While on the phone, she wanted to discuss the clonazepam. She would like try another medication. She does not feel comfortable taking the clonazepam and then sleeping at night. She is supposed to be using the oral appliance that Dr. KRon Parkermade for her but she is not. She mentioned that she has tried [place] in the past.  ? ?Dr. YAnnamaria Boots can you please advise? Thanks!  ?

## 2021-08-27 NOTE — Telephone Encounter (Signed)
ATC patient. LVMTCB. 

## 2021-08-27 NOTE — Telephone Encounter (Signed)
It looks as if I haven't seen her in 2 years- she keeps seeing the NP. ?Please make her an ov f/u with me- not urgent but ok to use held spot. We will talk over considerations for sleep medicine. ?

## 2021-08-31 ENCOUNTER — Telehealth: Payer: Self-pay | Admitting: *Deleted

## 2021-08-31 NOTE — Telephone Encounter (Signed)
Returned TC to pt regarding increase in hot flashes night and day. Wants to increase estrace from 1 mg. Assured pt that message would be routed to Dr. Elly Modena for further advise. ?

## 2021-09-01 ENCOUNTER — Other Ambulatory Visit: Payer: Self-pay | Admitting: *Deleted

## 2021-09-01 ENCOUNTER — Other Ambulatory Visit: Payer: Self-pay | Admitting: Obstetrics and Gynecology
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
## 2021-09-01 NOTE — Progress Notes (Signed)
[name], Vickii Chafe, MD  Penny Pia, RN ?She can take 1 and 1/2 tablet or 2 tablets daily but that is the maximum dose. As we discussed previously, she should be decreasing her dose over time  ? ?Returned pts call and gave above advise for previous concerns with increased hot flashes and dose increase requested. ?

## 2021-09-02 ENCOUNTER — Ambulatory Visit (INDEPENDENT_AMBULATORY_CARE_PROVIDER_SITE_OTHER): Payer: Medicare Other | Admitting: *Deleted

## 2021-09-02 ENCOUNTER — Ambulatory Visit (INDEPENDENT_AMBULATORY_CARE_PROVIDER_SITE_OTHER): Payer: Medicare Other | Admitting: Internal Medicine

## 2021-09-02 ENCOUNTER — Encounter: Payer: Self-pay | Admitting: Internal Medicine

## 2021-09-02 DIAGNOSIS — G4700 Insomnia, unspecified: Secondary | ICD-10-CM | POA: Diagnosis not present

## 2021-09-02 DIAGNOSIS — F17200 Nicotine dependence, unspecified, uncomplicated: Secondary | ICD-10-CM | POA: Diagnosis not present

## 2021-09-02 DIAGNOSIS — G4733 Obstructive sleep apnea (adult) (pediatric): Secondary | ICD-10-CM

## 2021-09-02 DIAGNOSIS — J449 Chronic obstructive pulmonary disease, unspecified: Secondary | ICD-10-CM | POA: Diagnosis not present

## 2021-09-02 DIAGNOSIS — Z0000 Encounter for general adult medical examination without abnormal findings: Secondary | ICD-10-CM | POA: Diagnosis not present

## 2021-09-02 MED ORDER — QUVIVIQ 50 MG PO TABS: ORAL_TABLET | ORAL | 5 refills | Status: DC

## 2021-09-02 MED ORDER — BREZTRI AEROSPHERE 160-9-4.8 MCG/ACT IN AERO: 2.0000 | INHALATION_SPRAY | Freq: Two times a day (BID) | RESPIRATORY_TRACT | 0 refills | Status: DC

## 2021-09-02 NOTE — Patient Instructions (Signed)
Script sent to try Quviviq again for sleep instead of clonazepam  Use your Nicoderm patches- you really need to stop smoking  Order- sample x 2 Breztri inhaler    This is a maintenance inhaler  - inhale 2 puffs, then rinse mouth, twice every day  Please call if we can help
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
## 2021-09-06 DIAGNOSIS — M6289 Other specified disorders of muscle: Secondary | ICD-10-CM | POA: Insufficient documentation

## 2021-09-07 ENCOUNTER — Telehealth: Payer: Self-pay | Admitting: Internal Medicine

## 2021-09-07 DIAGNOSIS — H30033 Focal chorioretinal inflammation, peripheral, bilateral: Secondary | ICD-10-CM | POA: Diagnosis not present

## 2021-09-07 DIAGNOSIS — H35372 Puckering of macula, left eye: Secondary | ICD-10-CM | POA: Diagnosis not present

## 2021-09-07 DIAGNOSIS — Z961 Presence of intraocular lens: Secondary | ICD-10-CM | POA: Diagnosis not present

## 2021-09-07 DIAGNOSIS — Z79899 Other long term (current) drug therapy: Secondary | ICD-10-CM | POA: Diagnosis not present

## 2021-09-07 MED ORDER — BREZTRI AEROSPHERE 160-9-4.8 MCG/ACT IN AERO: 2.0000 | INHALATION_SPRAY | Freq: Two times a day (BID) | RESPIRATORY_TRACT | 5 refills | Status: DC

## 2021-09-07 NOTE — Telephone Encounter (Signed)
Patient returned call. She stated that she was told by [facility] that she needs a PA on her [name] '50mg'$  medication. She would like to see if we could go ahead and get started on it. I advised her that I would send a message to our pharmacy team. She verbalized understanding.   While on the phone, she also stated that the [name] has worked wonders for her. Because of this, she would like to have a RX sent to [facility]. Advised her that I would go ahead and send in the RX for her.   PA Team, can we start a PA on her [name]? Thanks!

## 2021-09-07 NOTE — Telephone Encounter (Signed)
Called patient but she did not answer. Left message for her to call back.  

## 2021-09-08 NOTE — Telephone Encounter (Signed)
Called and spoke with pt letting her know info per [facility] and stated to her that we would call her Monday after Dr. Annamaria Boots reviewed this and she verbalized understanding.

## 2021-09-08 NOTE — Telephone Encounter (Signed)
Spoke with pt who states she was told by her pharmacy [name] would still coast over 500 dollars. Pt is requesting that another medication be called in that would be more affordable. Beth could you please advise as Dr. Annamaria Boots is currently unavailable? Thank you

## 2021-09-08 NOTE — Telephone Encounter (Signed)
Id prefer he be the one to recommend medication when he returns. Not urgent.   Cc: Dr. Annamaria Boots

## 2021-09-09 DIAGNOSIS — Z79891 Long term (current) use of opiate analgesic: Secondary | ICD-10-CM | POA: Diagnosis not present

## 2021-09-09 DIAGNOSIS — G890 Central pain syndrome: Secondary | ICD-10-CM | POA: Diagnosis not present

## 2021-09-09 DIAGNOSIS — N3010 Interstitial cystitis (chronic) without hematuria: Secondary | ICD-10-CM | POA: Diagnosis not present

## 2021-09-09 DIAGNOSIS — R1084 Generalized abdominal pain: Secondary | ICD-10-CM | POA: Diagnosis not present

## 2021-09-09 DIAGNOSIS — G894 Chronic pain syndrome: Secondary | ICD-10-CM | POA: Diagnosis not present

## 2021-09-14 ENCOUNTER — Telehealth: Payer: Self-pay | Admitting: Internal Medicine

## 2021-09-14 ENCOUNTER — Other Ambulatory Visit: Payer: Self-pay | Admitting: Internal Medicine

## 2021-09-14 ENCOUNTER — Telehealth: Payer: Self-pay | Admitting: Diagnostic Neuroimaging

## 2021-09-14 MED ORDER — LAMOTRIGINE 100 MG PO TABS: ORAL_TABLET | ORAL | 5 refills | Status: DC

## 2021-09-14 MED ORDER — FLURAZEPAM HCL 30 MG PO CAPS: 30.0000 mg | ORAL_CAPSULE | Freq: Every evening | ORAL | 0 refills | Status: DC | PRN

## 2021-09-14 NOTE — Telephone Encounter (Signed)
Patient checking on message left. Patient phone number is [phone].

## 2021-09-14 NOTE — Telephone Encounter (Signed)
Refill has been sent as requested.

## 2021-09-14 NOTE — Telephone Encounter (Signed)
Script for flurazepam sent to [facility]. Try 1 at bedtime for sleep.

## 2021-09-14 NOTE — Telephone Encounter (Signed)
Pt called stating that she is needing a refill for her lamoTRIgine (LAMICTAL) 100 MG tablet sent in to the [place] on [address].

## 2021-09-14 NOTE — Telephone Encounter (Signed)
Dr Annamaria Boots, please advise on alternative for [name] as this is going to cost her over $500.

## 2021-09-14 NOTE — Telephone Encounter (Signed)
Spoke with the pt and notified of response per Dr Annamaria Boots. She will call us if has any trouble getting this medication. Nothing further needed.

## 2021-09-15 ENCOUNTER — Other Ambulatory Visit (HOSPITAL_COMMUNITY): Payer: Self-pay

## 2021-09-15 NOTE — Telephone Encounter (Signed)
ATC patient and LMTCB.  Routing to [facility] triage since she is [facility] [facility] patient

## 2021-09-15 NOTE — Telephone Encounter (Signed)
Called and spoke to patient and she voiced understanding.   Dr. Annamaria Boots re routing to you. Please send back to [facility] triage. Tahnk you!

## 2021-09-15 NOTE — Telephone Encounter (Signed)
As this is a controlled substance and I have never seen the patient, please wait for Dr. Janee Morn recommendations tomorrow. I cannot prescribe anything controlled without having never seen her. Sorry for any inconvenience this has caused her. Thanks.

## 2021-09-15 NOTE — Telephone Encounter (Signed)
I sent in script for flurazepam trial to drug store yesterday. Please have her f/u with me in 1 month. Ok to use held-spot.

## 2021-09-15 NOTE — Telephone Encounter (Signed)
Called and spoke to patient and she states that flurazepam has been discontinued per pharmacist at [facility]  Is asking for an alternative medication.   Dr. Annamaria Boots is out office today. Asked patient if she would like to wait until tomorrow for him to address and she states she would prefer an answer today since she saw him 2 weeks ago and still hasnt gotten to take the medications he preferred.   Sending to DOD.  Katie Cobb Please advise. Thank you!

## 2021-09-15 NOTE — Telephone Encounter (Signed)
Patient states Flurazepam has been discontinued. Patient phone number is [phone].

## 2021-09-16 NOTE — Telephone Encounter (Signed)
Called and spoke with pt letting her know that CY sent the Rx for flurazepam to the pharmacy. After stating that to pt, pt said that flurazepam has been fully discontinued at the pharmacy and they no longer have it.  Routing back to Dr. Annamaria Boots for medication change.

## 2021-09-16 NOTE — Telephone Encounter (Signed)
Patient is returning phone call. Patient phone number is [phone]. 

## 2021-09-17 DIAGNOSIS — R3915 Urgency of urination: Secondary | ICD-10-CM | POA: Insufficient documentation

## 2021-09-17 DIAGNOSIS — M6289 Other specified disorders of muscle: Secondary | ICD-10-CM | POA: Diagnosis not present

## 2021-09-17 DIAGNOSIS — N3010 Interstitial cystitis (chronic) without hematuria: Secondary | ICD-10-CM | POA: Diagnosis not present

## 2021-09-17 DIAGNOSIS — R350 Frequency of micturition: Secondary | ICD-10-CM | POA: Diagnosis not present

## 2021-09-17 DIAGNOSIS — Z9189 Other specified personal risk factors, not elsewhere classified: Secondary | ICD-10-CM | POA: Diagnosis not present

## 2021-09-17 NOTE — Telephone Encounter (Signed)
Spoke with the pt  She is aware we are waiting for response from Dr Annamaria Boots on alternative for flurazepam

## 2021-09-17 NOTE — Telephone Encounter (Signed)
Patient called to check the status of this. Needs new medication. Call back [phone]

## 2021-09-17 NOTE — Telephone Encounter (Signed)
Spoke with pt and updated her that we have not heard back Dr. Annamaria Boots as of yet. Pt stated understanding. Pt stated she would be calling back on Monday. Dr. Annamaria Boots please advise.

## 2021-09-20 MED ORDER — ZOLPIDEM TARTRATE 5 MG PO TABS: ORAL_TABLET | ORAL | 5 refills | Status: DC

## 2021-09-20 NOTE — Telephone Encounter (Signed)
She has tried a lot of sleep meds. Has she tried zolpidem ? Does she remember what has worked best for her?

## 2021-09-20 NOTE — Telephone Encounter (Signed)
I called and spoke with the pt and notified of response per DR Annamaria Boots. Nothing further needed.

## 2021-09-20 NOTE — Telephone Encounter (Signed)
Dr Annamaria Boots, pl calling back again about sleep med. Needing alternative to flurazepam

## 2021-09-20 NOTE — Telephone Encounter (Signed)
Zolpidem script sent

## 2021-09-20 NOTE — Telephone Encounter (Signed)
Spoke with the pt and notified. She has tried zolpidem years ago and done well with it. She does not recall the strength of the med. She is willing to try this again. I confirmed [facility] pharm [facility] [address].

## 2021-09-20 NOTE — Telephone Encounter (Signed)
Patient checking on message sent for sleep medication. Patient phone number is [phone].

## 2021-09-24 ENCOUNTER — Other Ambulatory Visit: Payer: Self-pay | Admitting: Critical Care Medicine

## 2021-09-24 DIAGNOSIS — K117 Disturbances of salivary secretion: Secondary | ICD-10-CM

## 2021-09-29 DIAGNOSIS — B078 Other viral warts: Secondary | ICD-10-CM | POA: Diagnosis not present

## 2021-09-29 DIAGNOSIS — L821 Other seborrheic keratosis: Secondary | ICD-10-CM | POA: Diagnosis not present

## 2021-10-04 ENCOUNTER — Encounter: Payer: Self-pay | Admitting: Internal Medicine

## 2021-10-04 NOTE — Assessment & Plan Note (Signed)
Emphasized importance and discussed use of nicotine patches.

## 2021-10-04 NOTE — Assessment & Plan Note (Signed)
Occasional rescue inhaler. Plan- sample Breztri for trial

## 2021-10-04 NOTE — Assessment & Plan Note (Signed)
She is working with Dr Ron Parker on oral appliance.

## 2021-10-04 NOTE — Assessment & Plan Note (Signed)
Ok to [employer]. Medication talk done.

## 2021-10-05 ENCOUNTER — Telehealth: Payer: Self-pay

## 2021-10-05 NOTE — Telephone Encounter (Signed)
Patient called requesting refill for her estrace. Patient states that her dosage was increased to 4 tablets daily. She states that her pharmacy will not allow her to refill the rx at this time due to it not being time for a refill. Patient states that she needs a prescription sent to the pharmacy with the increased dose. She currently has 4 tablets left.

## 2021-10-07 DIAGNOSIS — N3010 Interstitial cystitis (chronic) without hematuria: Secondary | ICD-10-CM | POA: Diagnosis not present

## 2021-10-07 DIAGNOSIS — R1084 Generalized abdominal pain: Secondary | ICD-10-CM | POA: Diagnosis not present

## 2021-10-07 DIAGNOSIS — Z79891 Long term (current) use of opiate analgesic: Secondary | ICD-10-CM | POA: Diagnosis not present

## 2021-10-07 DIAGNOSIS — G890 Central pain syndrome: Secondary | ICD-10-CM | POA: Diagnosis not present

## 2021-10-07 DIAGNOSIS — G894 Chronic pain syndrome: Secondary | ICD-10-CM | POA: Diagnosis not present

## 2021-10-11 ENCOUNTER — Telehealth: Payer: Self-pay

## 2021-10-11 ENCOUNTER — Other Ambulatory Visit: Payer: Self-pay

## 2021-10-11 ENCOUNTER — Ambulatory Visit: Payer: Medicare Other | Admitting: Internal Medicine

## 2021-10-11 ENCOUNTER — Telehealth: Payer: Self-pay | Admitting: Primary Care

## 2021-10-11 DIAGNOSIS — J449 Chronic obstructive pulmonary disease, unspecified: Secondary | ICD-10-CM

## 2021-10-11 MED ORDER — BUPROPION HCL ER (SR) 150 MG PO TB12: 150.0000 mg | ORAL_TABLET | Freq: Two times a day (BID) | ORAL | 5 refills | Status: DC

## 2021-10-11 NOTE — Telephone Encounter (Signed)
The patient was last seen 08/2021. She wants to know if you are willing to send in the Wellbutrin tomorrow morning for the smoking cessation. Please advise.

## 2021-10-11 NOTE — Telephone Encounter (Signed)
 S/w pt and advised that refill request was routed to provider for further review, will follow up

## 2021-10-11 NOTE — Telephone Encounter (Signed)
Wellbutrin sent to [facility].

## 2021-10-12 ENCOUNTER — Other Ambulatory Visit: Payer: Self-pay | Admitting: Obstetrics and Gynecology

## 2021-10-12 ENCOUNTER — Telehealth: Payer: Self-pay

## 2021-10-12 DIAGNOSIS — N951 Menopausal and female climacteric states: Secondary | ICD-10-CM

## 2021-10-12 MED ORDER — ESTRADIOL 2 MG PO TABS: 2.0000 mg | ORAL_TABLET | Freq: Every day | ORAL | 6 refills | Status: DC

## 2021-10-12 NOTE — Telephone Encounter (Signed)
Called and spoke with patient to let her know that rx has been sent to pharmacy. She expressed understanding. Nothing further needed at this time.

## 2021-10-12 NOTE — Telephone Encounter (Signed)
 Called pt and advised that rx was sent to pharmacy.

## 2021-10-14 NOTE — Telephone Encounter (Signed)
Since calling the office 4/25, pt as seen by Dr. Annamaria Boots for an Alicia 5/18. Nothing further needed.

## 2021-11-01 DIAGNOSIS — G894 Chronic pain syndrome: Secondary | ICD-10-CM | POA: Diagnosis not present

## 2021-11-01 DIAGNOSIS — G890 Central pain syndrome: Secondary | ICD-10-CM | POA: Diagnosis not present

## 2021-11-01 DIAGNOSIS — R1084 Generalized abdominal pain: Secondary | ICD-10-CM | POA: Diagnosis not present

## 2021-11-01 DIAGNOSIS — Z79891 Long term (current) use of opiate analgesic: Secondary | ICD-10-CM | POA: Diagnosis not present

## 2021-11-01 DIAGNOSIS — N3010 Interstitial cystitis (chronic) without hematuria: Secondary | ICD-10-CM | POA: Diagnosis not present

## 2021-11-03 DIAGNOSIS — R350 Frequency of micturition: Secondary | ICD-10-CM | POA: Diagnosis not present

## 2021-11-03 DIAGNOSIS — Z9189 Other specified personal risk factors, not elsewhere classified: Secondary | ICD-10-CM | POA: Diagnosis not present

## 2021-11-03 DIAGNOSIS — R3915 Urgency of urination: Secondary | ICD-10-CM | POA: Diagnosis not present

## 2021-11-03 DIAGNOSIS — M6289 Other specified disorders of muscle: Secondary | ICD-10-CM | POA: Diagnosis not present

## 2021-11-03 DIAGNOSIS — N3010 Interstitial cystitis (chronic) without hematuria: Secondary | ICD-10-CM | POA: Diagnosis not present

## 2021-11-03 DIAGNOSIS — Z79899 Other long term (current) drug therapy: Secondary | ICD-10-CM | POA: Diagnosis not present

## 2021-11-03 DIAGNOSIS — H35372 Puckering of macula, left eye: Secondary | ICD-10-CM | POA: Diagnosis not present

## 2021-11-15 ENCOUNTER — Telehealth: Payer: Self-pay | Admitting: Internal Medicine

## 2021-11-15 NOTE — Telephone Encounter (Signed)
Called patient and she states that she does not want to wear the oral device that was ordered. She is wondering how severe her sleep apnea is if it goes untreated  Please advise sir

## 2021-11-16 NOTE — Telephone Encounter (Signed)
Called and went over information from Dr Annamaria Boots. She states that she will continue to wear the oral device. She states she will see CY at follow up. Nothing further needed

## 2021-11-16 NOTE — Telephone Encounter (Signed)
Her sleep apnea is in the mild range. Treating it may help with daytime tiredness Over many years, she is more likely to have heart and blood pressure problems if it goes untreated, but I am not concerned about any immediate problems. We can discuss next time she comes in.

## 2021-11-29 DIAGNOSIS — G890 Central pain syndrome: Secondary | ICD-10-CM | POA: Diagnosis not present

## 2021-11-29 DIAGNOSIS — N3010 Interstitial cystitis (chronic) without hematuria: Secondary | ICD-10-CM | POA: Diagnosis not present

## 2021-11-29 DIAGNOSIS — Z79891 Long term (current) use of opiate analgesic: Secondary | ICD-10-CM | POA: Diagnosis not present

## 2021-11-29 DIAGNOSIS — G894 Chronic pain syndrome: Secondary | ICD-10-CM | POA: Diagnosis not present

## 2021-11-29 DIAGNOSIS — R1084 Generalized abdominal pain: Secondary | ICD-10-CM | POA: Diagnosis not present

## 2021-12-25 ENCOUNTER — Other Ambulatory Visit: Payer: Self-pay | Admitting: Critical Care Medicine

## 2021-12-25 DIAGNOSIS — E782 Mixed hyperlipidemia: Secondary | ICD-10-CM

## 2021-12-27 ENCOUNTER — Ambulatory Visit: Payer: Medicare Other | Admitting: Critical Care Medicine

## 2021-12-27 DIAGNOSIS — N3010 Interstitial cystitis (chronic) without hematuria: Secondary | ICD-10-CM | POA: Diagnosis not present

## 2021-12-27 DIAGNOSIS — G894 Chronic pain syndrome: Secondary | ICD-10-CM | POA: Diagnosis not present

## 2021-12-27 DIAGNOSIS — G890 Central pain syndrome: Secondary | ICD-10-CM | POA: Diagnosis not present

## 2021-12-27 DIAGNOSIS — R1084 Generalized abdominal pain: Secondary | ICD-10-CM | POA: Diagnosis not present

## 2021-12-27 DIAGNOSIS — Z79891 Long term (current) use of opiate analgesic: Secondary | ICD-10-CM | POA: Diagnosis not present

## 2021-12-28 DIAGNOSIS — Z79899 Other long term (current) drug therapy: Secondary | ICD-10-CM | POA: Diagnosis not present

## 2021-12-28 DIAGNOSIS — H2513 Age-related nuclear cataract, bilateral: Secondary | ICD-10-CM | POA: Diagnosis not present

## 2021-12-28 DIAGNOSIS — H35372 Puckering of macula, left eye: Secondary | ICD-10-CM | POA: Diagnosis not present

## 2021-12-28 DIAGNOSIS — H30033 Focal chorioretinal inflammation, peripheral, bilateral: Secondary | ICD-10-CM | POA: Diagnosis not present

## 2022-01-18 DIAGNOSIS — G890 Central pain syndrome: Secondary | ICD-10-CM | POA: Diagnosis not present

## 2022-01-18 DIAGNOSIS — N3010 Interstitial cystitis (chronic) without hematuria: Secondary | ICD-10-CM | POA: Diagnosis not present

## 2022-01-18 DIAGNOSIS — Z79891 Long term (current) use of opiate analgesic: Secondary | ICD-10-CM | POA: Diagnosis not present

## 2022-01-18 DIAGNOSIS — R1084 Generalized abdominal pain: Secondary | ICD-10-CM | POA: Diagnosis not present

## 2022-01-18 DIAGNOSIS — G894 Chronic pain syndrome: Secondary | ICD-10-CM | POA: Diagnosis not present

## 2022-01-28 ENCOUNTER — Telehealth: Payer: Self-pay | Admitting: Internal Medicine

## 2022-01-28 MED ORDER — ZOLPIDEM TARTRATE 5 MG PO TABS: ORAL_TABLET | ORAL | 1 refills | Status: DC

## 2022-01-28 NOTE — Telephone Encounter (Signed)
Ambien script sent

## 2022-01-28 NOTE — Telephone Encounter (Signed)
Called and spoke with patient. She stated that she has been having a hard time sleeping. She has increased the Ambien to '10mg'$  on her own and she is currently out of her prescription. She mentioned that she tried the '5mg'$  dose but she was having to take Benadryl as well. She has even added clonazepam '1mg'$  to this for relief. She also mentioned that she is on oxycodone '15mg'$  for pain. She reports having a hard time falling asleep and staying asleep during the night. Denied using any caffeine before bed.   She was originally scheduled for a follow up in Nov but I was able to get it moved up to October 20th at 11am.   She wanted to know if CY would be willing to send in a temporary RX of her Ambien to last until her next appt.   CY, can you please advise? Thanks!

## 2022-01-28 NOTE — Telephone Encounter (Signed)
Called and spoke with pt letting her know that CY refilled her [place] and she verbalized understanding. Nothing further needed.

## 2022-01-28 NOTE — Telephone Encounter (Signed)
Called and spoke with pt letting her know info per CY and she verbalized understanding. Nothing further needed.

## 2022-01-28 NOTE — Telephone Encounter (Signed)
Dr. Annamaria Boots, please advise if you are okay refilling pt's clonazepam.  Allergies  Allergen Reactions   Haloperidol Decanoate Anaphylaxis   Oxybutynin Chloride Other (See Comments)    TROUBLE SWALLOWING   Tape     Adhesive tape--itching     Current Outpatient Medications:    albuterol (VENTOLIN HFA) 108 (90 Base) MCG/ACT inhaler, Inhale 2 puffs into the lungs every 6 (six) hours as needed for wheezing or shortness of breath., Disp: 54 g, Rfl: 2   azaTHIOprine (IMURAN) 50 MG tablet, Take 150 mg by mouth daily., Disp: , Rfl:    Budeson-Glycopyrrol-Formoterol (BREZTRI AEROSPHERE) 160-9-4.8 MCG/ACT AERO, Inhale 2 puffs into the lungs 2 (two) times daily., Disp: 10.7 g, Rfl: 5   buPROPion (WELLBUTRIN SR) 150 MG 12 hr tablet, Take 1 tablet (150 mg total) by mouth 2 (two) times daily., Disp: 60 tablet, Rfl: 5   Cholecalciferol (VITAMIN D) 50 MCG (2000 UT) tablet, Take 2,000 Units by mouth daily., Disp: , Rfl:    cycloSPORINE (RESTASIS) 0.05 % ophthalmic emulsion, Place 1 drop into both eyes daily., Disp: , Rfl:    estradiol (ESTRACE) 2 MG tablet, Take 1 tablet (2 mg total) by mouth daily., Disp: 30 tablet, Rfl: 6   flurazepam (DALMANE) 30 MG capsule, Take 1 capsule (30 mg total) by mouth at bedtime as needed for sleep., Disp: 30 capsule, Rfl: 0   HYDROcodone-acetaminophen (NORCO) 10-325 MG tablet, 02/22/21 takes 5 x day, Disp: , Rfl:    lamoTRIgine (LAMICTAL) 100 MG tablet, TAKE 1 TABLET BY MOUTH EVERY MORNING AND 2 TABLETS EVERY EVENING, Disp: 90 tablet, Rfl: 5   Multiple Vitamin (MULTIVITAMIN WITH MINERALS) TABS tablet, Take 1 tablet by mouth daily after breakfast. [employer]rum Women's 50+, Disp: , Rfl:    pantoprazole (PROTONIX) 40 MG tablet, Take 40 mg by mouth daily., Disp: , Rfl:    simvastatin (ZOCOR) 40 MG tablet, TAKE 1 TABLET BY MOUTH EVERY DAY IN THE EVENING, Disp: 90 tablet, Rfl: 0   zolpidem (AMBIEN) 5 MG tablet, 1 at bedtime for sleep as needed, Disp: 30 tablet, Rfl: 1

## 2022-01-28 NOTE — Telephone Encounter (Signed)
Not both. I just refilled her ambien and clonazepam is no longer on her med list.

## 2022-01-31 ENCOUNTER — Telehealth: Payer: Self-pay | Admitting: Internal Medicine

## 2022-01-31 DIAGNOSIS — J3489 Other specified disorders of nose and nasal sinuses: Secondary | ICD-10-CM | POA: Diagnosis not present

## 2022-01-31 DIAGNOSIS — J342 Deviated nasal septum: Secondary | ICD-10-CM | POA: Diagnosis not present

## 2022-01-31 DIAGNOSIS — R0981 Nasal congestion: Secondary | ICD-10-CM | POA: Diagnosis not present

## 2022-01-31 DIAGNOSIS — J343 Hypertrophy of nasal turbinates: Secondary | ICD-10-CM | POA: Diagnosis not present

## 2022-01-31 NOTE — Telephone Encounter (Signed)
[facility] and spoke with a Occupational psychologist. She reviewed the patient's chart and did not see any documentation of the above message. She transferred me to the pharmacist but I was not able to speak with them. I left a detailed message for them to call back. Will leave encounter open.

## 2022-02-01 ENCOUNTER — Encounter: Payer: Self-pay | Admitting: Obstetrics and Gynecology

## 2022-02-01 ENCOUNTER — Ambulatory Visit (INDEPENDENT_AMBULATORY_CARE_PROVIDER_SITE_OTHER): Payer: Medicare Other | Admitting: Obstetrics and Gynecology

## 2022-02-01 VITALS — BP 118/73 | HR 71 | Wt 139.0 lb

## 2022-02-01 DIAGNOSIS — N644 Mastodynia: Secondary | ICD-10-CM | POA: Diagnosis not present

## 2022-02-01 MED ORDER — ZOLPIDEM TARTRATE 10 MG PO TABS: 10.0000 mg | ORAL_TABLET | Freq: Every evening | ORAL | 0 refills | Status: DC | PRN
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
## 2022-02-01 NOTE — Telephone Encounter (Signed)
Called and spoke with patient. She is aware that the updated RX has been sent to her pharmacy.   Nothing further needed at time of call.

## 2022-02-01 NOTE — Telephone Encounter (Signed)
Called and spoke with pharmacist. She stated that if a new prescription for Ambien '10mg'$  is sent over, they will be able to fill it early for her. If Dr. Annamaria Boots wants to keep her on the '5mg'$ , she will have to wait 2 more weeks before they are allowed to fill it. Patient reported to the pharmacy as well that she has increased her dosage.   Dr. Annamaria Boots, can you please advise? Thanks!

## 2022-02-01 NOTE — Telephone Encounter (Signed)
Patient is calling to get the status of her medication increase request.  Patient would like to speak with the nurse to get the status of that request.  Please call patient at [phone]

## 2022-02-01 NOTE — Telephone Encounter (Signed)
Ambien script sent changing dose to 10 mg/ night if needed

## 2022-02-01 NOTE — Progress Notes (Signed)
Pt states she was having some breasts pain, doing better now. Pt states mostly on left side - pt hasn't noticed any changes in breast tissue. Pt is taking '2mg'$  Estrace but will have nightly episodes of hot flashes.
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
## 2022-02-04 ENCOUNTER — Other Ambulatory Visit: Payer: Self-pay

## 2022-02-04 ENCOUNTER — Encounter: Payer: Self-pay | Admitting: Internal Medicine

## 2022-02-04 ENCOUNTER — Ambulatory Visit (INDEPENDENT_AMBULATORY_CARE_PROVIDER_SITE_OTHER): Payer: Medicare Other | Admitting: Internal Medicine

## 2022-02-04 ENCOUNTER — Ambulatory Visit (INDEPENDENT_AMBULATORY_CARE_PROVIDER_SITE_OTHER): Payer: Medicare Other

## 2022-02-04 VITALS — BP 114/62 | HR 75 | Ht 65.5 in | Wt 140.2 lb

## 2022-02-04 DIAGNOSIS — F17200 Nicotine dependence, unspecified, uncomplicated: Secondary | ICD-10-CM | POA: Diagnosis not present

## 2022-02-04 DIAGNOSIS — R0609 Other forms of dyspnea: Secondary | ICD-10-CM | POA: Diagnosis not present

## 2022-02-04 DIAGNOSIS — G4733 Obstructive sleep apnea (adult) (pediatric): Secondary | ICD-10-CM | POA: Diagnosis not present

## 2022-02-04 DIAGNOSIS — Z23 Encounter for immunization: Secondary | ICD-10-CM

## 2022-02-04 DIAGNOSIS — G4700 Insomnia, unspecified: Secondary | ICD-10-CM

## 2022-02-04 DIAGNOSIS — J449 Chronic obstructive pulmonary disease, unspecified: Secondary | ICD-10-CM

## 2022-02-04 MED ORDER — ALBUTEROL SULFATE HFA 108 (90 BASE) MCG/ACT IN AERS: 2.0000 | INHALATION_SPRAY | Freq: Four times a day (QID) | RESPIRATORY_TRACT | 4 refills | Status: DC | PRN

## 2022-02-04 MED ORDER — BREZTRI AEROSPHERE 160-9-4.8 MCG/ACT IN AERO: 2.0000 | INHALATION_SPRAY | Freq: Two times a day (BID) | RESPIRATORY_TRACT | 12 refills | Status: DC

## 2022-02-04 NOTE — Assessment & Plan Note (Signed)
Ambien 10 mg helps.  We have discussed goals, sleep habits and alternatives.

## 2022-02-04 NOTE — Patient Instructions (Signed)
Refills sent for Breztri and ventolin  Order- CXR  dx tobacco use  Try wearing a nicotine patch to see if it will reduce your smoking especially at night  Order- flu vax senior

## 2022-02-04 NOTE — Assessment & Plan Note (Signed)
Stable.  Breztri helps. Plan-CXR, smoking cessation, refill inhalers

## 2022-02-04 NOTE — Assessment & Plan Note (Signed)
She has bupropion and a number of nicotine replacement products but has not been using them.  I asked her to try nicotine patches again at least to see if we could reduce her nocturnal smoking.

## 2022-02-04 NOTE — Assessment & Plan Note (Signed)
She has had mild OSA.  Not overweight.  Not successful with either CPAP or oral appliance in the past.  I do not know if upcoming nasal airway procedure will make a lot of difference but we agreed we could look at this problem again after that is completed.

## 2022-02-07 ENCOUNTER — Telehealth: Payer: Self-pay | Admitting: Internal Medicine

## 2022-02-07 ENCOUNTER — Other Ambulatory Visit: Payer: Self-pay

## 2022-02-07 DIAGNOSIS — J849 Interstitial pulmonary disease, unspecified: Secondary | ICD-10-CM

## 2022-02-07 NOTE — Telephone Encounter (Signed)
Called pt and there was no answer- LMTCB and will forward to triage to call again tomorrow

## 2022-02-07 NOTE — Telephone Encounter (Signed)
She has tried a number of other meds. This ambien dose was too much for her. Suggest she break her tabs in half. Go back to taking 5 mg ambien. She can try combining that with Tylenol or maybe Tylenol PM.

## 2022-02-07 NOTE — Telephone Encounter (Signed)
Called and spoke with patient. She stated that she had not had any problems with Ambien '10mg'$  until this past Friday. She took her usual dose of '10mg'$  before bed. She remembers hearing a lot of loud noise and people talking in her house. She stated that no one was in her house at the time. She also doesn't remember if she was awake at this point or if she was dreaming.   Because of this episode, she has stopped taking the Ambien. She has not had anymore episodes. She denied having any of these experiences before in the past nor any episodes during the daytime.   She wanted to know if Dr. Annamaria Boots had any recommendations for her.   Dr. Annamaria Boots, can you please advise? Thanks!

## 2022-02-07 NOTE — Telephone Encounter (Signed)
Patient is returning phone call. Patient phone number is [phone].

## 2022-02-08 NOTE — Telephone Encounter (Signed)
Spoke with the pt and notified of response per Dr Annamaria Boots. Pt verbalized understanding  Nothing further needed

## 2022-02-09 ENCOUNTER — Telehealth: Payer: Self-pay | Admitting: Internal Medicine

## 2022-02-10 NOTE — Telephone Encounter (Signed)
I spoke with the pt  She is incredibly nervous about her HRCT that is scheduled for 02/16/22  She says her father had pulmonary fibrosis and suffered with this before he passed  She is asking that we schedule her for a visit the day after her scan is done so that CY can go over it with her  She does not want to get bad news over the phone  You have a blocked spot end of the day 02/17/22  Can I put her in it? Thanks!

## 2022-02-10 NOTE — Telephone Encounter (Signed)
Spoke with the pt and scheduled her to see Dr Annamaria Boots at 4:30 pm on 02/17/22 Nothing further needed

## 2022-02-10 NOTE — Telephone Encounter (Signed)
Called the pt and there was no answer- LMTCB    

## 2022-02-10 NOTE — Telephone Encounter (Signed)
Fine to use that held spot

## 2022-02-11 DIAGNOSIS — R0981 Nasal congestion: Secondary | ICD-10-CM | POA: Diagnosis not present

## 2022-02-11 DIAGNOSIS — J342 Deviated nasal septum: Secondary | ICD-10-CM | POA: Diagnosis not present

## 2022-02-11 DIAGNOSIS — J343 Hypertrophy of nasal turbinates: Secondary | ICD-10-CM | POA: Diagnosis not present

## 2022-02-11 DIAGNOSIS — J3489 Other specified disorders of nose and nasal sinuses: Secondary | ICD-10-CM | POA: Diagnosis not present

## 2022-02-16 ENCOUNTER — Ambulatory Visit (HOSPITAL_COMMUNITY)
Admission: RE | Admit: 2022-02-16 | Discharge: 2022-02-16 | Disposition: A | Discharge status: Home / Self Care | Payer: Medicare Other | Source: Ambulatory Visit | Attending: Internal Medicine | Admitting: Internal Medicine

## 2022-02-16 DIAGNOSIS — J849 Interstitial pulmonary disease, unspecified: Secondary | ICD-10-CM | POA: Diagnosis present

## 2022-02-16 DIAGNOSIS — Q2547 Right aortic arch: Secondary | ICD-10-CM | POA: Diagnosis not present

## 2022-02-16 DIAGNOSIS — J432 Centrilobular emphysema: Secondary | ICD-10-CM | POA: Diagnosis not present

## 2022-02-16 DIAGNOSIS — S2241XA Multiple fractures of ribs, right side, initial encounter for closed fracture: Secondary | ICD-10-CM | POA: Diagnosis not present

## 2022-02-16 DIAGNOSIS — J8410 Pulmonary fibrosis, unspecified: Secondary | ICD-10-CM | POA: Diagnosis not present
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
## 2022-02-17 ENCOUNTER — Ambulatory Visit (INDEPENDENT_AMBULATORY_CARE_PROVIDER_SITE_OTHER): Payer: Medicare Other | Admitting: Internal Medicine

## 2022-02-17 ENCOUNTER — Encounter: Payer: Self-pay | Admitting: Internal Medicine

## 2022-02-17 VITALS — BP 116/78 | HR 80 | Ht 66.0 in | Wt 140.6 lb

## 2022-02-17 DIAGNOSIS — G4733 Obstructive sleep apnea (adult) (pediatric): Secondary | ICD-10-CM | POA: Diagnosis not present

## 2022-02-17 DIAGNOSIS — G4700 Insomnia, unspecified: Secondary | ICD-10-CM | POA: Diagnosis not present

## 2022-02-17 DIAGNOSIS — F17200 Nicotine dependence, unspecified, uncomplicated: Secondary | ICD-10-CM

## 2022-02-17 MED ORDER — ESZOPICLONE 3 MG PO TABS: ORAL_TABLET | ORAL | 1 refills | Status: DC

## 2022-02-17 NOTE — Patient Instructions (Signed)
Script sent for lunesta- try 1 at bedtime for sleep  Continue working to stop smoking  Keep appointment in 6 months unless needed sooner

## 2022-02-18 ENCOUNTER — Telehealth: Payer: Self-pay | Admitting: Internal Medicine

## 2022-02-18 ENCOUNTER — Encounter: Payer: Self-pay | Admitting: Internal Medicine

## 2022-02-18 MED ORDER — CLONAZEPAM 1 MG PO TABS: 1.0000 mg | ORAL_TABLET | Freq: Two times a day (BID) | ORAL | 0 refills | Status: DC

## 2022-02-18 NOTE — Telephone Encounter (Signed)
Called and spoke with pt letting her know this had been done and she verbalized understanding. Nothing further needed.

## 2022-02-18 NOTE — Telephone Encounter (Signed)
Called and spoke with pt who states that the [name] is requiring a PA. Due to this and due to the fact that she will need something to take over the weekend, pt is requesting to have clonazepam prescribed to see if she is able to get that med without a PA being needed.  Dr. Annamaria Boots, please advise on this.  Allergies  Allergen Reactions   Haloperidol Decanoate Anaphylaxis   Oxybutynin Chloride Other (See Comments)    TROUBLE SWALLOWING   Tape     Adhesive tape--itching     Current Outpatient Medications:    albuterol (VENTOLIN HFA) 108 (90 Base) MCG/ACT inhaler, Inhale 2 puffs into the lungs every 6 (six) hours as needed for wheezing or shortness of breath., Disp: 54 g, Rfl: 4   azaTHIOprine (IMURAN) 50 MG tablet, Take 150 mg by mouth daily., Disp: , Rfl:    Budeson-Glycopyrrol-Formoterol (BREZTRI AEROSPHERE) 160-9-4.8 MCG/ACT AERO, Inhale 2 puffs into the lungs 2 (two) times daily., Disp: 10.7 g, Rfl: 12   buPROPion (WELLBUTRIN SR) 150 MG 12 hr tablet, Take 1 tablet (150 mg total) by mouth 2 (two) times daily., Disp: 60 tablet, Rfl: 5   Cholecalciferol (VITAMIN D) 50 MCG (2000 UT) tablet, Take 2,000 Units by mouth daily., Disp: , Rfl:    cycloSPORINE (RESTASIS) 0.05 % ophthalmic emulsion, Place 1 drop into both eyes daily., Disp: , Rfl:    estradiol (ESTRACE) 2 MG tablet, Take 1 tablet (2 mg total) by mouth daily., Disp: 30 tablet, Rfl: 6   Eszopiclone 3 MG TABS, Take 1 at bedtime for sleep, Disp: 30 tablet, Rfl: 1   lamoTRIgine (LAMICTAL) 100 MG tablet, TAKE 1 TABLET BY MOUTH EVERY MORNING AND 2 TABLETS EVERY EVENING, Disp: 90 tablet, Rfl: 5   Multiple Vitamin (MULTIVITAMIN WITH MINERALS) TABS tablet, Take 1 tablet by mouth daily after breakfast. [employer] Women's 50+, Disp: , Rfl:    oxyCODONE HCl 15 MG TABA, Take by mouth., Disp: , Rfl:    prednisoLONE acetate (PRED FORTE) 1 % ophthalmic suspension, Place 1 drop into the left eye 3 (three) times daily., Disp: , Rfl:    simvastatin (ZOCOR)  40 MG tablet, TAKE 1 TABLET BY MOUTH EVERY DAY IN THE EVENING, Disp: 90 tablet, Rfl: 0   zolpidem (AMBIEN) 10 MG tablet, Take 1 tablet (10 mg total) by mouth at bedtime as needed for sleep., Disp: 30 tablet, Rfl: 0

## 2022-02-18 NOTE — Assessment & Plan Note (Signed)
Continue emphasis on availability of support and encouragement towards smoking cessation.  She has a number of pharmacologic supports at home but has not been using them consistently.

## 2022-02-18 NOTE — Telephone Encounter (Signed)
Clonazepam script sent

## 2022-02-18 NOTE — Assessment & Plan Note (Signed)
She was unable to be compliant with either CPAP or fitted oral appliance.  She is not overweight and hopeful that recent nasal surgery might improve airflow enough to make a difference.  I suggested emphasis on sleeping off flat of back and elevating head during sleep.

## 2022-02-18 NOTE — Assessment & Plan Note (Signed)
We discussed sleep hygiene, impact of tobacco, and medical options again. Plan-prescription for Lunesta 3 mg

## 2022-02-21 ENCOUNTER — Other Ambulatory Visit: Payer: Self-pay

## 2022-02-21 DIAGNOSIS — R911 Solitary pulmonary nodule: Secondary | ICD-10-CM

## 2022-02-22 ENCOUNTER — Ambulatory Visit: Payer: Medicare Other | Admitting: Internal Medicine

## 2022-02-23 ENCOUNTER — Telehealth: Payer: Self-pay | Admitting: Diagnostic Neuroimaging

## 2022-02-23 ENCOUNTER — Ambulatory Visit: Payer: Medicare Other | Admitting: Adult Health

## 2022-02-23 MED ORDER — LAMOTRIGINE 100 MG PO TABS: ORAL_TABLET | ORAL | 0 refills | Status: DC

## 2022-02-23 NOTE — Telephone Encounter (Signed)
Pt is calling. Requesting a refill on medication    [id number]   lamoTRIgine (LAMICTAL) 100 MG tablet. Refill should be sent

## 2022-02-23 NOTE — Telephone Encounter (Signed)
Medication refill sent in for the pt to [facility] for a month supply with no refills to make sure comes to December f/u apt

## 2022-02-24 ENCOUNTER — Other Ambulatory Visit (HOSPITAL_COMMUNITY): Payer: Self-pay

## 2022-02-28 ENCOUNTER — Telehealth: Payer: Self-pay | Admitting: Internal Medicine

## 2022-02-28 NOTE — Telephone Encounter (Signed)
Spoke with pt who states she is experiencing  increased trouble sleeping due to her niece committing suicide this weekend. Pt is requesting that Dr. Annamaria Boots increase Klonopin to help with sleep. Dr. Annamaria Boots please advise. Pt states she takes 6 Benadryl along with Klonopin at night to get to sleep.

## 2022-02-28 NOTE — Telephone Encounter (Signed)
Left message for patient to call back  

## 2022-02-28 NOTE — Telephone Encounter (Signed)
I don't think we should increase med. Please seek counseling. I am sorry for the loss.

## 2022-03-01 ENCOUNTER — Encounter: Payer: Self-pay | Admitting: Internal Medicine

## 2022-03-01 ENCOUNTER — Ambulatory Visit (INDEPENDENT_AMBULATORY_CARE_PROVIDER_SITE_OTHER): Payer: Medicare Other | Admitting: Internal Medicine

## 2022-03-01 VITALS — BP 120/70 | HR 88 | Ht 65.5 in | Wt 139.2 lb

## 2022-03-01 DIAGNOSIS — J449 Chronic obstructive pulmonary disease, unspecified: Secondary | ICD-10-CM | POA: Diagnosis not present

## 2022-03-01 DIAGNOSIS — F4321 Adjustment disorder with depressed mood: Secondary | ICD-10-CM | POA: Diagnosis not present

## 2022-03-01 DIAGNOSIS — Z79891 Long term (current) use of opiate analgesic: Secondary | ICD-10-CM | POA: Diagnosis not present

## 2022-03-01 DIAGNOSIS — F17200 Nicotine dependence, unspecified, uncomplicated: Secondary | ICD-10-CM | POA: Diagnosis not present

## 2022-03-01 DIAGNOSIS — G890 Central pain syndrome: Secondary | ICD-10-CM | POA: Diagnosis not present

## 2022-03-01 DIAGNOSIS — G894 Chronic pain syndrome: Secondary | ICD-10-CM | POA: Diagnosis not present

## 2022-03-01 DIAGNOSIS — F32A Depression, unspecified: Secondary | ICD-10-CM

## 2022-03-01 DIAGNOSIS — N3010 Interstitial cystitis (chronic) without hematuria: Secondary | ICD-10-CM | POA: Diagnosis not present

## 2022-03-01 DIAGNOSIS — R1084 Generalized abdominal pain: Secondary | ICD-10-CM | POA: Diagnosis not present

## 2022-03-01 MED ORDER — CLONAZEPAM 1 MG PO TABS: ORAL_TABLET | ORAL | 0 refills | Status: DC
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
## 2022-03-01 NOTE — Telephone Encounter (Signed)
Spoke with [name] and reviewed Dr. Janee Morn message. [name] stated understanding. [name] requested sooner appointment so [name] was scheduled in open slot today with Dr. Annamaria Boots at 3pm. Nothing further needed at this time.

## 2022-03-01 NOTE — Patient Instructions (Addendum)
Clonazepam refilled for up to 3 tabs daily, this time only.  Order- referral to [facility]   (ASAP)  dx depression, acute, grief

## 2022-03-02 ENCOUNTER — Telehealth: Payer: Self-pay

## 2022-03-02 ENCOUNTER — Other Ambulatory Visit (HOSPITAL_COMMUNITY): Payer: Self-pay

## 2022-03-02 NOTE — Telephone Encounter (Signed)
Patient Advocate Encounter   Received notification from [facility] that prior authorization is required for Eszopiclone '3MG'$  tablets  Submitted: 03-02-2022 Key [id number]  Status is pending

## 2022-03-03 ENCOUNTER — Telehealth: Payer: Self-pay | Admitting: Internal Medicine

## 2022-03-03 NOTE — Telephone Encounter (Signed)
Called patient and she states that she has appt with a psychiatrist doc on Tuesday December 13th. But she is wanting to know if there are any other doctors that could see her sooner in regards to her anti depression medication. She states that her niece killed herself on Saturday and now she is going through a lot of depression right now.   She sounds very sad and depressed on the phone. When she is talking she is all over the place.   She is wondering if there is anything that Dr Annamaria Boots can do. She states she is frustrated because she can not see a psych doctor for another month. She is wanting to know if we can send a referral to see someone sooner or anything.   Emergency room is the only suggestion I would have for this patient in regards to be seen sooner if she is needing to be seen sooner.    Please advise sir

## 2022-03-03 NOTE — Telephone Encounter (Signed)
Called and left detailed voicemail for patient the recommendations from Dr Annamaria Boots. I told her if she had any questions or concerns to call office back. Nothing further needed

## 2022-03-03 NOTE — Telephone Encounter (Signed)
Patient called to ask if there is another psychiatrist that she can see before 12/13 where she can get her anti-depressant medication. Patient stated she did not want to ask her PCP for personal reasons.  Please advise and call patient to discuss.  CB# [phone]

## 2022-03-03 NOTE — Telephone Encounter (Signed)
I believe she can just go over to the  [facility] on [address] and walk in and they can help.

## 2022-03-07 ENCOUNTER — Ambulatory Visit: Payer: Medicare Other | Admitting: Internal Medicine

## 2022-03-11 NOTE — Telephone Encounter (Signed)
Received a fax from [facility] regarding Prior Authorization for Eszopiclone '3MG'$  tablets .   Authorization has been DENIED due to patient needs try/fail or documentation why patient cannot take or has a contraindication to preferred drug: belsomra.
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
## 2022-03-14 ENCOUNTER — Ambulatory Visit: Payer: Self-pay | Admitting: *Deleted

## 2022-03-14 ENCOUNTER — Telehealth: Payer: Self-pay | Admitting: Internal Medicine

## 2022-03-14 NOTE — Telephone Encounter (Signed)
fyi

## 2022-03-14 NOTE — Telephone Encounter (Signed)
Do you have time to check with her?  I thought she had tried and failed Belsomra in past. We can tell her insurance wants [place] to try it again.

## 2022-03-14 NOTE — Telephone Encounter (Signed)
  Chief Complaint: Depression Symptoms: States depressed, angry niece committed suicide. Weight loss, not sleeping. "I'm on the angry stage right now."  Frequency: 02/26/22 Pertinent Negatives: Patient denies Suicidal or homicidal ideation.Denies any physical symptoms.   Disposition: '[]'$ ED /'[]'$ Urgent Care (no appt availability in office) / '[x]'$ Appointment(In office/virtual)/ '[]'$  [place] [facility]/ '[]'$ Home Care/ '[]'$ Refused Recommended Disposition /'[]'$ [place] [facility]/ '[]'$  Follow-up with PCP Additional Notes: Declines appt, declines [facility] UC, declines EMS. Tearful at times. States no transportation, "I need to talk to someone face to face."  Again offered EMS,   "I'll call after I cook my [place] if needed."  Advised NT would call for her, angry affect , "I'm not going to hurt myself or anyone." Provided resource numbers to [facility], [place] and Hotline. Pt calmer by end of triage. Assured pt NT would route to practice for PCPs review.  Reason for Disposition  [1] Depression AND [2] worsening (e.g., sleeping poorly, less able to do activities of daily living)  Answer Assessment - Initial Assessment Questions 1. CONCERN: "What happened that made you call today?"     Death of niece 2. DEPRESSION SYMPTOM SCREENING: "How are you feeling overall?" (e.g., decreased energy, increased sleeping or difficulty sleeping, difficulty concentrating, feelings of sadness, guilt, hopelessness, or worthlessness)     Can't sleep,sadness, weight loss, can't concentrate. 3. RISK OF HARM - SUICIDAL IDEATION:  "Do you ever have thoughts of hurting or killing yourself?"  (e.g., yes, no, no but preoccupation with thoughts about death)   - INTENT:  "Do you have thoughts of hurting or killing yourself right NOW?" (e.g., yes, no, N/A)   - PLAN: "Do you have a specific plan for how you would do this?" (e.g., gun, knife, overdose, no plan, N/A)     No 4. RISK OF HARM - HOMICIDAL IDEATION:  "Do you ever have thoughts of hurting or  killing someone else?"  (e.g., yes, no, no but preoccupation with thoughts about death)   - INTENT:  "Do you have thoughts of hurting or killing someone right NOW?" (e.g., yes, no, N/A)   - PLAN: "Do you have a specific plan for how you would do this?" (e.g., gun, knife, no plan, N/A)      No, Angry right now. 5. FUNCTIONAL IMPAIRMENT: "How have things been going for you overall? Have you had more difficulty than usual doing your normal daily activities?"  (e.g., better, same, worse; self-care, school, work, interactions)     Can't function 6. SUPPORT: "Who is with you now?" "Who do you live with?" "Do you have family or friends who you can talk to?"      No 7. THERAPIST: "Do you have a counselor or therapist? Name?"     No 8. STRESSORS: "Has there been any new stress or recent changes in your life?"     Death of best friend, niece. 9. ALCOHOL USE OR SUBSTANCE USE (DRUG USE): "Do you drink alcohol or use any illegal drugs?"      10. OTHER: "Do you have any other physical symptoms right now?" (e.g., fever)       Insomnia.  Protocols used: Depression-A-AH

## 2022-03-15 ENCOUNTER — Encounter: Payer: Self-pay | Admitting: Internal Medicine

## 2022-03-15 ENCOUNTER — Ambulatory Visit: Payer: Medicare Other | Admitting: Critical Care Medicine

## 2022-03-15 NOTE — Assessment & Plan Note (Signed)
She can't cope right now, but emphasis on long term withdrawal. She has a variety of nicotine replacement products at home and needs to resume use.

## 2022-03-15 NOTE — Telephone Encounter (Signed)
ATC LMTCB 03/15/22 @ 9:41 AM

## 2022-03-15 NOTE — Telephone Encounter (Signed)
I called the pt and there was no answer- LMTCB  I reviewed her past prescriptions in Epic and do not see that we have ever prescribed Belsomra.

## 2022-03-15 NOTE — Telephone Encounter (Signed)
Denial letter attached in patient documents.

## 2022-03-15 NOTE — Assessment & Plan Note (Signed)
Anxious and depressed by suicide in family Plan- referred to [facility]

## 2022-03-15 NOTE — Telephone Encounter (Signed)
ATC LMTCB 03/15/22 @ 9:42 AM

## 2022-03-15 NOTE — Assessment & Plan Note (Signed)
At baseline with ongoing smoking Plan- emphasis on smoking cessation

## 2022-03-17 NOTE — Telephone Encounter (Signed)
Called and left vm  °

## 2022-03-22 NOTE — Telephone Encounter (Signed)
Noted  

## 2022-03-22 NOTE — Telephone Encounter (Signed)
Spoke with patient. She recently lost her niece to suicide. I was able to schedule her an appointment with Dr. Thereasa Solo.

## 2022-03-29 ENCOUNTER — Ambulatory Visit: Payer: Medicare Other | Admitting: Diagnostic Neuroimaging

## 2022-03-29 ENCOUNTER — Other Ambulatory Visit: Payer: Self-pay | Admitting: Diagnostic Neuroimaging

## 2022-03-29 ENCOUNTER — Telehealth: Payer: Self-pay | Admitting: Internal Medicine

## 2022-03-29 DIAGNOSIS — Z79891 Long term (current) use of opiate analgesic: Secondary | ICD-10-CM | POA: Diagnosis not present

## 2022-03-29 DIAGNOSIS — R1084 Generalized abdominal pain: Secondary | ICD-10-CM | POA: Diagnosis not present

## 2022-03-29 DIAGNOSIS — G890 Central pain syndrome: Secondary | ICD-10-CM | POA: Diagnosis not present

## 2022-03-29 DIAGNOSIS — N3010 Interstitial cystitis (chronic) without hematuria: Secondary | ICD-10-CM | POA: Diagnosis not present

## 2022-03-29 DIAGNOSIS — G894 Chronic pain syndrome: Secondary | ICD-10-CM | POA: Diagnosis not present

## 2022-03-29 MED ORDER — CLONAZEPAM 1 MG PO TABS: ORAL_TABLET | ORAL | 0 refills | Status: DC

## 2022-03-29 NOTE — Telephone Encounter (Signed)
Called patient and she is asking if she can get a refill on her Klonopin '1mg'$ . She is asking to decrease it to 2 times daily as needed. She is asking for it to be sent to:  [facility] on [address]  Please advise sir

## 2022-03-29 NOTE — Telephone Encounter (Signed)
Called and update patient that medication had been refilled. Nothing further needed

## 2022-03-29 NOTE — Telephone Encounter (Signed)
Clonazepam refilled 

## 2022-03-30 ENCOUNTER — Ambulatory Visit (HOSPITAL_COMMUNITY): Payer: Medicare Other | Admitting: Psychiatry

## 2022-04-04 DIAGNOSIS — Z48813 Encounter for surgical aftercare following surgery on the respiratory system: Secondary | ICD-10-CM | POA: Diagnosis not present

## 2022-04-05 ENCOUNTER — Ambulatory Visit: Payer: Self-pay

## 2022-04-05 NOTE — Telephone Encounter (Signed)
  Chief Complaint: Depression  - suicide of niece Symptoms:  Frequency: Ongoing  Pertinent Negatives: Patient denies currently using  Disposition: '[]'$ ED /'[]'$ Urgent Care (no appt availability in office) / '[]'$ Appointment(In office/virtual)/ '[]'$  [place] Virtual Care/ '[]'$ Home Care/ '[]'$ Refused Recommended Disposition /'[]'$ [place] [facility]/ '[x]'$  Follow-up with PCP Additional Notes: PT called she is very upset and depressed over the recent suicide of her niece. Pt was close to her niece and was acting as the bridge between strained relations of pt with her son, and strained relationships with her brother. Pt has long history of drug use.  Tried to call [facility] 2 times - no one picked up. Gave phone number to pt, also gave number for suicide prevention. Suggested that pt go to ED. Pt stated she was ok now, and would follow up with [facility] and Behavioral health provider tomorrow.     Reason for Disposition  Recent death of a loved one  Answer Assessment - Initial Assessment Questions 1. CONCERN: "What happened that made you call today?"     Niece suicide. 2. DEPRESSION SYMPTOM SCREENING: "How are you feeling overall?" (e.g., decreased energy, increased sleeping or difficulty sleeping, difficulty concentrating, feelings of sadness, guilt, hopelessness, or worthlessness)     sad 3. RISK OF HARM - SUICIDAL IDEATION:  "Do you ever have thoughts of hurting or killing yourself?"  (e.g., yes, no, no but preoccupation with thoughts about death) no   - INTENT:  "Do you have thoughts of hurting or killing yourself right NOW?" (e.g., yes, no, N/A)   - PLAN: "Do you have a specific plan for how you would do this?" (e.g., gun, knife, overdose, no plan, N/A)     no 4. RISK OF HARM - HOMICIDAL IDEATION:  "Do you ever have thoughts of hurting or killing someone else?"  (e.g., yes, no, no but preoccupation with thoughts about death)   - INTENT:  "Do you have thoughts of hurting or killing someone right NOW?" (e.g., yes, no,  N/A)   - PLAN: "Do you have a specific plan for how you would do this?" (e.g., gun, knife, no plan, N/A)      no 5. FUNCTIONAL IMPAIRMENT: "How have things been going for you overall? Have you had more difficulty than usual doing your normal daily activities?"  (e.g., better, same, worse; self-care, school, work, interactions)     yes 6. SUPPORT: "Who is with you now?" "Who do you live with?" "Do you have family or friends who you can talk to?"      No - brother in [place] 7. THERAPIST: "Do you have a counselor or therapist? Name?"     no 8. STRESSORS: "Has there been any new stress or recent changes in your life?"     Death of niece 5. ALCOHOL USE OR SUBSTANCE USE (DRUG USE): "Do you drink alcohol or use any illegal drugs?"     No crack, takes klonopin, oxycodone 15 mg - 6-7 benadryl 10. OTHER: "Do you have any other physical symptoms right now?" (e.g., fever)       Can't stop crying. 11. PREGNANCY: "Is there any chance you are pregnant?" "When was your last menstrual period?"  Protocols used: Depression-A-AH

## 2022-04-14 NOTE — Telephone Encounter (Signed)
Hey, I've spoken with her before and she declined to speak with me. I gave her resources for other services as well. I will follow up

## 2022-04-15 ENCOUNTER — Other Ambulatory Visit: Payer: Self-pay | Admitting: Internal Medicine

## 2022-04-15 DIAGNOSIS — E782 Mixed hyperlipidemia: Secondary | ICD-10-CM

## 2022-04-20 ENCOUNTER — Telehealth: Payer: Self-pay | Admitting: Physician Assistant

## 2022-04-20 ENCOUNTER — Ambulatory Visit: Payer: Medicare Other | Attending: Physician Assistant | Admitting: Physician Assistant

## 2022-04-20 ENCOUNTER — Encounter: Payer: Self-pay | Admitting: Physician Assistant

## 2022-04-20 VITALS — BP 117/74 | HR 77 | Wt 133.6 lb

## 2022-04-20 DIAGNOSIS — F32A Depression, unspecified: Secondary | ICD-10-CM | POA: Insufficient documentation

## 2022-04-20 DIAGNOSIS — G4709 Other insomnia: Secondary | ICD-10-CM | POA: Diagnosis present

## 2022-04-20 DIAGNOSIS — G40909 Epilepsy, unspecified, not intractable, without status epilepticus: Secondary | ICD-10-CM | POA: Insufficient documentation

## 2022-04-20 DIAGNOSIS — F4322 Adjustment disorder with anxiety: Secondary | ICD-10-CM | POA: Insufficient documentation

## 2022-04-20 DIAGNOSIS — Z9152 Personal history of nonsuicidal self-harm: Secondary | ICD-10-CM | POA: Insufficient documentation

## 2022-04-20 DIAGNOSIS — E782 Mixed hyperlipidemia: Secondary | ICD-10-CM | POA: Diagnosis not present

## 2022-04-20 DIAGNOSIS — Z8614 Personal history of Methicillin resistant Staphylococcus aureus infection: Secondary | ICD-10-CM | POA: Insufficient documentation

## 2022-04-20 DIAGNOSIS — Z8719 Personal history of other diseases of the digestive system: Secondary | ICD-10-CM | POA: Insufficient documentation

## 2022-04-20 DIAGNOSIS — F4321 Adjustment disorder with depressed mood: Secondary | ICD-10-CM

## 2022-04-20 DIAGNOSIS — Z79899 Other long term (current) drug therapy: Secondary | ICD-10-CM | POA: Diagnosis not present

## 2022-04-20 DIAGNOSIS — F3289 Other specified depressive episodes: Secondary | ICD-10-CM

## 2022-04-20 DIAGNOSIS — F4320 Adjustment disorder, unspecified: Secondary | ICD-10-CM

## 2022-04-20 DIAGNOSIS — Z8711 Personal history of peptic ulcer disease: Secondary | ICD-10-CM | POA: Diagnosis not present

## 2022-04-20 MED ORDER — SERTRALINE HCL 50 MG PO TABS: 50.0000 mg | ORAL_TABLET | Freq: Every day | ORAL | 5 refills | Status: DC

## 2022-04-20 MED ORDER — TRAZODONE HCL 50 MG PO TABS: 50.0000 mg | ORAL_TABLET | Freq: Every evening | ORAL | 3 refills | Status: DC | PRN

## 2022-04-20 MED ORDER — SIMVASTATIN 40 MG PO TABS: ORAL_TABLET | ORAL | 3 refills | Status: DC

## 2022-04-20 NOTE — Patient Instructions (Signed)
Major Depressive Disorder, Adult Major depressive disorder (MDD) is a mental health condition. It may also be called clinical depression or unipolar depression. MDD causes symptoms of sadness, hopelessness, and loss of interest in things. These symptoms last most of the day, almost every day, for 2 weeks. MDD can also cause physical symptoms. It can interfere with relationships and activities, such as work, school, and activities that are usually pleasant. MDD may be mild, moderate, or severe. It may be single-episode MDD, which happens once, or recurrent MDD, which may occur many times. What are the causes? The exact cause of this condition is not known. What increases the risk? The following factors may make someone more likely to develop MDD: A family history of depression. Being female. Long-term (chronic) stress, physical illness, other mental health disorders, or substance misuse. Trauma, including: Family problems. Violence or abuse. Loss of a parent or close family member. Experiencing discrimination. What are the signs or symptoms? The main symptoms of MDD usually include: Constant depressed or irritable mood. A loss of interest in activities. Sleeping or eating too much or too little. Tiredness or low energy. Other symptoms include: Unexplained weight gain or weight loss. Being agitated, restless, or weak. Feeling hopeless, worthless, or guilty. Trouble thinking clearly or making decisions. Thoughts of suicide or harming others. Spending a lot of time alone. Not being able to complete daily tasks or work. Severe symptoms of this condition may include: Psychotic depression.This may include false beliefs or delusions. It may also include seeing, hearing, tasting, smelling, or feeling things that are not real (hallucinations). Chronic depression or persistent depressive disorder. This is low-level depression that lasts for at least 2 years. Melancholic depression, or feeling  extremely sad and hopeless. Catatonic depression, which includes trouble speaking and trouble moving. Seasonal depression, which is caused by changes in the seasons. How is this diagnosed? This condition may be diagnosed based on: Your symptoms. Your medical and mental health history. A physical exam. Blood tests to rule out other conditions. MDD is confirmed if you have either a depressed mood or loss of interest and at least four other MDD symptoms, most of the day, nearly every day, in a 2-week period. How is this treated? This condition is usually treated by mental health professionals, such as psychologists, psychiatrists, and clinical social workers. You may need more than one type of treatment. Treatment may include: Psychotherapy, also called talk therapy or counseling. Types of psychotherapy include: Cognitive behavioral therapy (CBT). This teaches you to recognize unhealthy feelings, thoughts, and behaviors, and replace them with positive thoughts and actions. Interpersonal therapy (IPT). This helps you to improve the way you communicate with others or relate to them. Family therapy. This treatment includes members of your family. Medicines to treat anxiety and depression. These medicines help to balance the brain chemicals that affect your emotions. Lifestyle changes. You may be asked to: Limit alcohol use and avoid drug use. Get regular exercise. Get plenty of sleep. Make healthy eating choices. Spend more time outdoors. Brain stimulation. This may be done if symptoms are very severe and other treatments have not worked. Examples of this treatment are electroconvulsive therapy and transcranial magnetic stimulation. Follow these instructions at home: Alcohol use Do not drink alcohol if: Your health care provider tells you not to drink. You are pregnant, may be pregnant, or are planning to become pregnant. If you drink alcohol: Limit how much you have to: 0-1 drink a day for  women 0-2 drinks a day for   men. Know how much alcohol is in your drink. In the [place] one drink equals one 12 oz bottle of beer (355 mL), one 5 oz glass of wine (148 mL), or one 1 oz glass of hard liquor (44 mL). Activity Exercise regularly and spend time outdoors. Find activities that you enjoy and make time to do them. Find healthy ways to manage stress, such as: Meditation or deep breathing. Spending time in nature. Journaling. Return to your normal activities as told by your health care provider. Ask your health care provider what activities are safe for you. General instructions  Take over-the-counter and prescription medicines only as told by your health care provider. Discuss alcohol use with your health care provider. Alcohol can affect any antidepressant medicines you are taking. Discuss any drug use with your health care provider. Eat a healthy diet and get enough sleep. Consider joining a support group. Your health care provider may be able to recommend one. Keep all follow-up visits. It is important for your health care provider to check on your mood, behavior, and medicines. Your health care provider will make changes to your treatment as needed. Where to find more information [employer] on Mental Illness: nami.[employer] of Mental Health: https://www.frey.org/ American Psychiatric Association: psychiatry.org Contact a health care provider if: Your symptoms get worse. You develop new symptoms. Get help right away if: You hurt yourself on purpose (self-harm). You have thoughts about hurting yourself or others. You have hallucinations. Get help right away if you feel like you may hurt yourself or others, or have thoughts about taking your own life. Go to your nearest emergency room or: Call 911. Call the [facility] at 210-520-4193 or 988. This is open 24 hours a day. Text the Crisis Text Line at 979-246-0385. This information is not  intended to replace advice given to you by your health care provider. Make sure you discuss any questions you have with your health care provider. Document Revised: 08/10/2021 Document Reviewed: 08/10/2021 Elsevier Patient Education  [employer]. Insomnia Insomnia is a sleep disorder that makes it difficult to fall asleep or stay asleep. Insomnia can cause fatigue, low energy, difficulty concentrating, mood swings, and poor performance at work or school. There are three different ways to classify insomnia: Difficulty falling asleep. Difficulty staying asleep. Waking up too early in the morning. Any type of insomnia can be long-term (chronic) or short-term (acute). Both are common. Short-term insomnia usually lasts for 3 months or less. Chronic insomnia occurs at least three times a week for longer than 3 months. What are the causes? Insomnia may be caused by another condition, situation, or substance, such as: Having certain mental health conditions, such as anxiety and depression. Using caffeine, alcohol, tobacco, or drugs. Having gastrointestinal conditions, such as gastroesophageal reflux disease (GERD). Having certain medical conditions. These include: Asthma. Alzheimer's disease. Stroke. Chronic pain. An overactive thyroid gland (hyperthyroidism). Other sleep disorders, such as restless legs syndrome and sleep apnea. Menopause. Sometimes, the cause of insomnia may not be known. What increases the risk? Risk factors for insomnia include: Gender. Females are affected more often than males. Age. Insomnia is more common as people get older. Stress and certain medical and mental health conditions. Lack of exercise. Having an irregular work schedule. This may include working night shifts and traveling between different time zones. What are the signs or symptoms? If you have insomnia, the main symptom is having trouble falling asleep or having trouble staying asleep. This may  lead to other symptoms, such as: Feeling tired or having low energy. Feeling nervous about going to sleep. Not feeling rested in the morning. Having trouble concentrating. Feeling irritable, anxious, or depressed. How is this diagnosed? This condition may be diagnosed based on: Your symptoms and medical history. Your health care provider may ask about: Your sleep habits. Any medical conditions you have. Your mental health. A physical exam. How is this treated? Treatment for insomnia depends on the cause. Treatment may focus on treating an underlying condition that is causing the insomnia. Treatment may also include: Medicines to help you sleep. Counseling or therapy. Lifestyle adjustments to help you sleep better. Follow these instructions at home: Eating and drinking  Limit or avoid alcohol, caffeinated beverages, and products that contain nicotine and tobacco, especially close to bedtime. These can disrupt your sleep. Do not eat a large meal or eat spicy foods right before bedtime. This can lead to digestive discomfort that can make it hard for you to sleep. Sleep habits  Keep a sleep diary to help you and your health care provider figure out what could be causing your insomnia. Write down: When you sleep. When you wake up during the night. How well you sleep and how rested you feel the next day. Any side effects of medicines you are taking. What you eat and drink. Make your bedroom a dark, comfortable place where it is easy to fall asleep. Put up shades or blackout curtains to block light from outside. Use a white noise machine to block noise. Keep the temperature cool. Limit screen use before bedtime. This includes: Not watching TV. Not using your smartphone, tablet, or computer. Stick to a routine that includes going to bed and waking up at the same times every day and night. This can help you fall asleep faster. Consider making a quiet activity, such as reading, part of  your nighttime routine. Try to avoid taking naps during the day so that you sleep better at night. Get out of bed if you are still awake after 15 minutes of trying to sleep. Keep the lights down, but try reading or doing a quiet activity. When you feel sleepy, go back to bed. General instructions Take over-the-counter and prescription medicines only as told by your health care provider. Exercise regularly as told by your health care provider. However, avoid exercising in the hours right before bedtime. Use relaxation techniques to manage stress. Ask your health care provider to suggest some techniques that may work well for you. These may include: Breathing exercises. Routines to release muscle tension. Visualizing peaceful scenes. Make sure that you drive carefully. Do not drive if you feel very sleepy. Keep all follow-up visits. This is important. Contact a health care provider if: You are tired throughout the day. You have trouble in your daily routine due to sleepiness. You continue to have sleep problems, or your sleep problems get worse. Get help right away if: You have thoughts about hurting yourself or someone else. Get help right away if you feel like you may hurt yourself or others, or have thoughts about taking your own life. Go to your nearest emergency room or: Call 911. Call the [facility] at [phone] or 988. This is open 24 hours a day. Text the Crisis Text Line at 323-168-6746. Summary Insomnia is a sleep disorder that makes it difficult to fall asleep or stay asleep. Insomnia can be long-term (chronic) or short-term (acute). Treatment for insomnia depends on the cause. Treatment  may focus on treating an underlying condition that is causing the insomnia. Keep a sleep diary to help you and your health care provider figure out what could be causing your insomnia. This information is not intended to replace advice given to you by your health care  provider. Make sure you discuss any questions you have with your health care provider. Document Revised: 03/15/2021 Document Reviewed: 03/15/2021 Elsevier Patient Education  [employer].
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
## 2022-04-25 ENCOUNTER — Ambulatory Visit: Payer: Self-pay

## 2022-04-25 ENCOUNTER — Telehealth: Payer: Self-pay | Admitting: Internal Medicine

## 2022-04-25 DIAGNOSIS — F4321 Adjustment disorder with depressed mood: Secondary | ICD-10-CM

## 2022-04-25 DIAGNOSIS — F4320 Adjustment disorder, unspecified: Secondary | ICD-10-CM

## 2022-04-25 DIAGNOSIS — F3289 Other specified depressive episodes: Secondary | ICD-10-CM

## 2022-04-25 MED ORDER — CLONAZEPAM 1 MG PO TABS: ORAL_TABLET | ORAL | 0 refills | Status: DC

## 2022-04-25 NOTE — Addendum Note (Signed)
Addended by: Karle Plumber B on: 04/25/2022 09:34 PM   Modules accepted: Orders

## 2022-04-25 NOTE — Telephone Encounter (Signed)
Spoke with patient advised of medication refills. Nothing further needed.

## 2022-04-25 NOTE — Telephone Encounter (Signed)
Clonazepam refilled 

## 2022-04-25 NOTE — Telephone Encounter (Signed)
  Chief Complaint: Medication increase - Grief counseling Symptoms: Not sleeping Frequency: ongoing Pertinent Negatives: Patient denies  Disposition: '[]'$ ED /'[]'$ Urgent Care (no appt availability in office) / '[]'$ Appointment(In office/virtual)/ '[]'$  [facility]/ '[]'$ Home Care/ '[]'$ Refused Recommended Disposition /'[]'$ [facility]/ '[x]'$  Follow-up with PCP Additional Notes: PT states that 50 mg of Trazodone is not effective at all. Pt was previously on 300 mg. Pt would like dosage increased.   Also pt need to get referral for hospice grief a counseling.      Summary: Pt request increase in dosage of her medication   Pt requests that the Rx for traZODone (DESYREL) 50 MG tablet be increased as much as possible. Pt stated she has been on this medication before and she was taking about 300 MG. Cb# [phone]       Reason for Disposition  Prescription request for new medicine (not a refill)  Answer Assessment - Initial Assessment Questions 1. DRUG NAME: "What medicine do you need to have refilled?"     Trazodone 2. REFILLS REMAINING: "How many refills are remaining?" (Note: The label on the medicine or pill bottle will show how many refills are remaining. If there are no refills remaining, then a renewal may be needed.)      3. EXPIRATION DATE: "What is the expiration date?" (Note: The label states when the prescription will expire, and thus can no longer be refilled.)      4. PRESCRIBING HCP: "Who prescribed it?" Reason: If prescribed by specialist, call should be referred to that group.      5. SYMPTOMS: "Do you have any symptoms?"      6. PREGNANCY: "Is there any chance that you are pregnant?" "When was your last menstrual period?"  Protocols used: Medication Refill and Renewal Call-A-AH

## 2022-04-25 NOTE — Telephone Encounter (Signed)
Copied from [facility] [id number]. Topic: Referral - Status >> Apr 25, 2022  4:55 PM Cyndi Bender wrote: Reason for CRM: Pt requests that the referral for counseling be expedited as she is concerned about her mental state.

## 2022-04-27 ENCOUNTER — Ambulatory Visit: Payer: Self-pay | Admitting: *Deleted

## 2022-04-27 NOTE — Telephone Encounter (Addendum)
Appointment with Freeman Caldron. Patient voices that she does not want to see Dr. Margarita Rana or Wynetta Emery again.

## 2022-04-27 NOTE — Telephone Encounter (Signed)
Called & spoke to the patient. Verified name & DOB. Patient stated that another staff member has already informed her about the Trazadone message. Patient previously spoke to [facility]

## 2022-04-27 NOTE — Telephone Encounter (Signed)
Called & spoke to the patient. Verified name & DOB. Patient stated that she has been previously contacted by another staff member and message was already given. [name] stated that she will temporarily be going to [facility] until she is able to go to [facility]. Declined to see Dr.Johnson. Appointment with Freeman Caldron already scheduled.

## 2022-04-27 NOTE — Telephone Encounter (Signed)
  Chief Complaint: Referral Request Symptoms: NA Frequency: NA Pertinent Negatives: Patient denies No suicidal ideation Disposition: '[]'$ ED /'[]'$ Urgent Care (no appt availability in office) / '[]'$ Appointment(In office/virtual)/ '[]'$  [facility]/ '[]'$ Home Care/ '[]'$ Refused Recommended Disposition /'[]'$ [facility]/ '[x]'$  Follow-up with PCP Additional Notes:  Pt calling with multiple issues, concerns. Messages read to pt regarding referrals and trazodone.  States she would like to be placed in [place] for grieving as Levada Dy had recommended last OV. States she will call Dr. Annamaria Boots if needed to facilitate this. States she did call and cancel appt to [facility] as she did not have funds for transportation  and did not want to call EMS. Reports did not request for increase of Trazodone to '300mg'$ , "I said whatever they will increase it to, never asked for '300mg'$ ." Requesting prescription of Amitriptyline for increase of saliva. States seen for this before and this med helped.   States does not want to see Dr. Wynetta Emery, "And I don't want anything to do with [facility]. I will only see Levada Dy who treated me with respect."  Pt denies any suicidal ideation but states "Other issues with family member have come up."  Sending HP due to Hospice request. Please advise. Reason for Disposition  [1] Caller requesting NON-URGENT health information AND [2] PCP's office is the best resource  Answer Assessment - Initial Assessment Questions 1. REASON FOR CALL or QUESTION: "What is your reason for calling today?" or "How can I best help you?" or "What question do you have that I can help answer?"     Multiple issues  Protocols used: Information Only Call - No Triage-A-AH

## 2022-05-04 ENCOUNTER — Emergency Department (HOSPITAL_COMMUNITY): Payer: Medicare Other

## 2022-05-04 ENCOUNTER — Emergency Department (HOSPITAL_COMMUNITY)
Admission: EM | Admit: 2022-05-04 | Discharge: 2022-05-04 | Discharge status: Against Medical Advice | Payer: Medicare Other | Attending: Student | Admitting: Student

## 2022-05-04 ENCOUNTER — Other Ambulatory Visit: Payer: Self-pay

## 2022-05-04 DIAGNOSIS — M549 Dorsalgia, unspecified: Secondary | ICD-10-CM | POA: Diagnosis not present

## 2022-05-04 DIAGNOSIS — R1010 Upper abdominal pain, unspecified: Secondary | ICD-10-CM | POA: Diagnosis not present

## 2022-05-04 DIAGNOSIS — K8050 Calculus of bile duct without cholangitis or cholecystitis without obstruction: Secondary | ICD-10-CM | POA: Diagnosis not present

## 2022-05-04 DIAGNOSIS — J449 Chronic obstructive pulmonary disease, unspecified: Secondary | ICD-10-CM | POA: Diagnosis not present

## 2022-05-04 DIAGNOSIS — I700 Atherosclerosis of aorta: Secondary | ICD-10-CM | POA: Diagnosis not present

## 2022-05-04 DIAGNOSIS — R109 Unspecified abdominal pain: Secondary | ICD-10-CM | POA: Diagnosis not present

## 2022-05-04 DIAGNOSIS — I10 Essential (primary) hypertension: Secondary | ICD-10-CM | POA: Diagnosis not present

## 2022-05-04 DIAGNOSIS — M545 Low back pain, unspecified: Secondary | ICD-10-CM | POA: Diagnosis not present

## 2022-05-04 DIAGNOSIS — Z5321 Procedure and treatment not carried out due to patient leaving prior to being seen by health care provider: Secondary | ICD-10-CM | POA: Insufficient documentation

## 2022-05-04 DIAGNOSIS — Z87891 Personal history of nicotine dependence: Secondary | ICD-10-CM | POA: Diagnosis not present

## 2022-05-04 DIAGNOSIS — R1084 Generalized abdominal pain: Secondary | ICD-10-CM | POA: Diagnosis not present

## 2022-05-04 DIAGNOSIS — R1013 Epigastric pain: Secondary | ICD-10-CM | POA: Diagnosis present

## 2022-05-04 DIAGNOSIS — R0902 Hypoxemia: Secondary | ICD-10-CM | POA: Diagnosis not present

## 2022-05-04 LAB — COMPREHENSIVE METABOLIC PANEL WITH GFR
ALT: 31 U/L (ref 0–44)
AST: 110 U/L — ABNORMAL HIGH (ref 15–41)
Albumin: 4.3 g/dL (ref 3.5–5.0)
Alkaline Phosphatase: 66 U/L (ref 38–126)
Anion gap: 7 (ref 5–15)
BUN: 9 mg/dL (ref 8–23)
CO2: 28 mmol/L (ref 22–32)
Calcium: 9.2 mg/dL (ref 8.9–10.3)
Chloride: 106 mmol/L (ref 98–111)
Creatinine, Ser: 0.88 mg/dL (ref 0.44–1.00)
GFR, Estimated: >60 mL/min
Glucose, Bld: 123 mg/dL — ABNORMAL HIGH (ref 70–99)
Potassium: 4.2 mmol/L (ref 3.5–5.1)
Sodium: 141 mmol/L (ref 135–145)
Total Bilirubin: 1.5 mg/dL — ABNORMAL HIGH (ref 0.3–1.2)
Total Protein: 6.9 g/dL (ref 6.5–8.1)

## 2022-05-04 LAB — CBC WITH DIFFERENTIAL/PLATELET
Abs Immature Granulocytes: 0.01 10*3/uL (ref 0.00–0.07)
Basophils Absolute: 0 10*3/uL (ref 0.0–0.1)
Basophils Relative: 1 %
Eosinophils Absolute: 0 10*3/uL (ref 0.0–0.5)
Eosinophils Relative: 1 %
HCT: 40.8 % (ref 36.0–46.0)
Hemoglobin: 14 g/dL (ref 12.0–15.0)
Immature Granulocytes: 0 %
Lymphocytes Relative: 11 %
Lymphs Abs: 0.5 10*3/uL — ABNORMAL LOW (ref 0.7–4.0)
MCH: 37 pg — ABNORMAL HIGH (ref 26.0–34.0)
MCHC: 34.3 g/dL (ref 30.0–36.0)
MCV: 107.9 fL — ABNORMAL HIGH (ref 80.0–100.0)
Monocytes Absolute: 0.5 10*3/uL (ref 0.1–1.0)
Monocytes Relative: 11 %
Neutro Abs: 3.4 10*3/uL (ref 1.7–7.7)
Neutrophils Relative %: 76 %
Platelets: 228 10*3/uL (ref 150–400)
RBC: 3.78 MIL/uL — ABNORMAL LOW (ref 3.87–5.11)
RDW: 14.9 % (ref 11.5–15.5)
WBC: 4.3 10*3/uL (ref 4.0–10.5)
nRBC: 0 % (ref 0.0–0.2)

## 2022-05-04 LAB — LIPASE, BLOOD: Lipase: 31 U/L (ref 11–51)

## 2022-05-04 MED ORDER — IOHEXOL 300 MG/ML  SOLN
100.0000 mL | Freq: Once | INTRAMUSCULAR | Status: AC | PRN
  Administered 2022-05-04: 100 mL via INTRAVENOUS

## 2022-05-04 NOTE — ED Provider Triage Note (Signed)
Emergency Medicine Provider Triage Evaluation Note  [name] , a 72 y.o. female  was evaluated in triage.  Pt complains of  Patient with medical history of hypertension, hyperlipidemia, tobacco abuse, MDD, COPD presents to the emergency department due to abdominal pain.  Is epigastric, radiates to the back.  It started acutely this morning and has been constant although the severity waxes and wanes..  Denies previous abdominal surgeries.  States she is also feeling depressed after having significant losses recently but denies any SI or HI.  No intent of harming herself, she has been followed by this in the outpatient setting.  Review of Systems  Per HPI  Physical Exam  Ht '5\' 6"'$  (1.676 m)   Wt 59 kg   BMI 20.98 kg/m  Gen:   Awake, no distress   Resp:  Normal effort  MSK:   Moves extremities without difficulty  Other:  Upper quadrant and epigastric TTP.  Upper and lower extremity pulses 2+ symmetric bilaterally  Medical Decision Making  Medically screening exam initiated at 2:03 PM.  Appropriate orders placed.  [name] was informed that the remainder of the evaluation will be completed by another provider, this initial triage assessment does not replace that evaluation, and the importance of remaining in the ED until their evaluation is complete.     Sherrill Raring, PA-C 05/04/22 1404

## 2022-05-04 NOTE — ED Triage Notes (Signed)
Pt BIB EMS from home c/o upper abd pain and lower back pain that started this morning. Pt states she took Oxycodone 1 hr pta. 118/70 BP 60 HR 97% 02 194 CBG

## 2022-05-05 ENCOUNTER — Telehealth: Payer: Self-pay | Admitting: Internal Medicine

## 2022-05-05 NOTE — Telephone Encounter (Signed)
Spoke with Ms. Sonoda she is no longer interested in the services from [facility] or [facility]. Ms. Antonio explains that her mental health issues are coming from her not being listened to by her primary care doctor. She wants to be placed in hospice. She also had a fall recently that has caused her to go to the ER. She would like for Freeman Caldron to reach out to her.

## 2022-05-06 DIAGNOSIS — R1084 Generalized abdominal pain: Secondary | ICD-10-CM | POA: Diagnosis not present

## 2022-05-06 DIAGNOSIS — N3010 Interstitial cystitis (chronic) without hematuria: Secondary | ICD-10-CM | POA: Diagnosis not present

## 2022-05-06 DIAGNOSIS — Z79891 Long term (current) use of opiate analgesic: Secondary | ICD-10-CM | POA: Diagnosis not present

## 2022-05-06 DIAGNOSIS — G894 Chronic pain syndrome: Secondary | ICD-10-CM | POA: Diagnosis not present

## 2022-05-06 DIAGNOSIS — G890 Central pain syndrome: Secondary | ICD-10-CM | POA: Diagnosis not present

## 2022-05-06 NOTE — Telephone Encounter (Signed)
Spoke with pt who states [name] was delivered to her home but she has since been unable to locate the inhaler. Pt was advised there were refills on the original Breztri order and to contract the pharmacy and they may be able to refill another Breztri. Pt was also offered to come to the office and pick up a sample but pt states she does not have transportation at this time. Pt does have albuterol inhaler that she will be using. Nothing further needed at this time.

## 2022-05-09 ENCOUNTER — Telehealth: Payer: Self-pay

## 2022-05-09 NOTE — Patient Outreach (Signed)
  Care Coordination TOC Note Transition Care Management Unsuccessful Follow-up Telephone Call  Date of discharge and from where:  05/04/22-[place] [facility]  Attempts:  1st Attempt  Reason for unsuccessful TCM follow-up call:  Left voice message     Enzo Montgomery, RN,BSN,CCM [place] Management Telephonic Care Management Coordinator Direct Phone: [phone] Toll Free: [phone] Fax: [phone]

## 2022-05-10 ENCOUNTER — Telehealth: Payer: Self-pay

## 2022-05-10 NOTE — Patient Outreach (Addendum)
  Care Coordination TOC Note Transition Care Management Follow-up Telephone Call Date of discharge and from where: 17/24-[place]   Red on EMMI-ED Discharge Alert Reason: "Have d/c instructions? No" Red Alert Date: 05/06/22 How have you been since you were released from the hospital? Patient states that for the first four days she was still feeling bad but is now starting to feel better.She has been able to  Any questions or concerns? Yes-patient wanting to know Hospice grief counseling number. States she was supposed to connect with them for grief services dealing with the death of her niece. Support given. Provided patient with contact info. Offered [facility] SW support but patient declined.   Items Reviewed: Did the pt receive and understand the discharge instructions provided?  Patient eloped from ED  Medications obtained and verified?  Patient still in bed-did not want to do so Other? No  Any new allergies since your discharge? No  Dietary orders reviewed? Yes Do you have support at home? No   Home Care and Equipment/Supplies: Were home health services ordered? not applicable If so, what is the name of the agency? N/A  Has the agency set up a time to come to the patient's home? not applicable Were any new equipment or medical supplies ordered?  No What is the name of the medical supply agency? N/A Were you able to get the supplies/equipment? not applicable Do you have any questions related to the use of the equipment or supplies? No  Functional Questionnaire: (I = Independent and D = Dependent) ADLs: I  Bathing/Dressing- I  Meal Prep- I  Eating- I  Maintaining continence- I  Transferring/Ambulation- I  Managing Meds- I  Follow up appointments reviewed:  PCP Hospital f/u appt confirmed? Yes  Scheduled to see Lavonda Jumbo on 05/23/22 @ 10:3am. Patient states this is the soonest appt available with provider. She is adamant that she does not want to see any other  provider Specialist Hospital f/u appt confirmed?  N/A   Are transportation arrangements needed? No  If their condition worsens, is the pt aware to call PCP or go to the Emergency Dept.? Yes Was the patient provided with contact information for the PCP's office or ED? Yes Was to pt encouraged to call back with questions or concerns? Yes  SDOH assessments and interventions completed:   Yes SDOH Interventions Today    Flowsheet Row Most Recent Value  SDOH Interventions   Food Insecurity Interventions Intervention Not Indicated  Transportation Interventions Intervention Not Indicated       Care Coordination Interventions:  Education provided    Encounter Outcome:  Pt. Visit Completed

## 2022-05-17 ENCOUNTER — Ambulatory Visit: Payer: Self-pay | Admitting: *Deleted

## 2022-05-17 NOTE — Telephone Encounter (Signed)
  Chief Complaint: recent ED visit- request provider review her imaging and labs. Patient states she got a call ED regarding the results- but did not understand.  Symptoms: patient reports she is better today- and has appointment 05/23/22 in office Frequency: Patient thinks she had dehydration that caused low BP and causing falling Pertinent Negatives: Patient denies falling since she has been hydrating  Disposition: '[]'$ ED /'[]'$ Urgent Care (no appt availability in office) / '[]'$ Appointment(In office/virtual)/ '[]'$  [place] [facility]/ '[]'$ Home Care/ '[]'$ Refused Recommended Disposition /'[]'$ [place] [facility]/ '[x]'$  Follow-up with PCP Additional Notes: Patient is requesting call back today regarding her results- she is very concerned- Patient advised it may take over 24 hours to have PCP review and respond
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
## 2022-05-18 ENCOUNTER — Telehealth: Payer: Self-pay | Admitting: Internal Medicine

## 2022-05-18 ENCOUNTER — Ambulatory Visit: Payer: Self-pay | Admitting: *Deleted

## 2022-05-18 NOTE — Telephone Encounter (Signed)
Summary: dx info   Patient called in about getting dx info from [facility] ER because she left before she could get a dx.       Chief Complaint: ED questions and possible dx for sx  Symptoms: weak upon standing  takes few seconds to feel balanced. Reports drinking Pedialyte and feeling better. BP WNL.  Frequency: last week  Pertinent Negatives: Patient denies falling no dizziness no low BP  Disposition: '[]'$ ED /'[]'$ Urgent Care (no appt availability in office) / '[]'$ Appointment(In office/virtual)/ '[]'$  [facility] [facility]/ '[]'$ Home Care/ '[]'$ Refused Recommended Disposition /'[]'$  Mobile Bus/ '[x]'$  Follow-up with PCP Additional Notes:   Patient requesting if PCP can call her to discuss what her possible dx could be since falling ,  low BP and dizziness. See previous NT encounter requesting call back. Patient reports to call any time.       Reason for Disposition  [1] Caller requesting NON-URGENT health information AND [2] PCP's office is the best resource  Answer Assessment - Initial Assessment Questions 1. REASON FOR CALL or QUESTION: "What is your reason for calling today?" or "How can I best help you?" or "What question do you have that I can help answer?"     Would like review recent issues with dizziness, low BP and ED visit.  Protocols used: Information Only Call - No Triage-A-AH

## 2022-05-19 ENCOUNTER — Telehealth: Payer: Self-pay | Admitting: Internal Medicine

## 2022-05-19 NOTE — Telephone Encounter (Signed)
Patient is calling back to make sure she will have transportation for her appointment on 05/23/22.  Please advise.      Copied from [place] [phone]. Topic: General - Inquiry >> May 17, 2022 11:02 AM Marcellus Scott wrote: Reason for CRM: Pt is requesting transportation for an upcoming appointment on 02/05 on 10:30AM. Stated has been unable to reach social services, and she does not want to miss her appointment.  Pt stated she would need a ride to appointment and home.   Please advise.

## 2022-05-19 NOTE — Telephone Encounter (Signed)
Spoke with patient. Patient advised per Dr. Wynetta Emery,  some of her liver function tests were elevated.  She is to  STOP SIMVASTATIN for now and keep up coming appointment on 05/23/2022. Her concerns will be discussed further at this visit. Patient voiced understanding of all discussed .

## 2022-05-19 NOTE — Telephone Encounter (Signed)
Patient is calling to see if she can speak with her PCP regarding her hospital visit on 05/04/22. Patient states that she is very concerned about her lab results.   Patient states that her blood pressure keep dropping, having chills, and dizzy.    Please advise

## 2022-05-20 DIAGNOSIS — Z23 Encounter for immunization: Secondary | ICD-10-CM | POA: Diagnosis not present

## 2022-05-23 ENCOUNTER — Ambulatory Visit: Payer: Medicare Other | Attending: Physician Assistant | Admitting: Physician Assistant

## 2022-05-23 ENCOUNTER — Other Ambulatory Visit: Payer: Self-pay | Admitting: Internal Medicine

## 2022-05-23 ENCOUNTER — Telehealth: Payer: Self-pay | Admitting: Internal Medicine

## 2022-05-23 VITALS — BP 132/94 | Ht 66.0 in | Wt 129.0 lb

## 2022-05-23 DIAGNOSIS — R7989 Other specified abnormal findings of blood chemistry: Secondary | ICD-10-CM | POA: Diagnosis present

## 2022-05-23 DIAGNOSIS — G4709 Other insomnia: Secondary | ICD-10-CM | POA: Diagnosis not present

## 2022-05-23 DIAGNOSIS — Z09 Encounter for follow-up examination after completed treatment for conditions other than malignant neoplasm: Secondary | ICD-10-CM

## 2022-05-23 DIAGNOSIS — F4320 Adjustment disorder, unspecified: Secondary | ICD-10-CM

## 2022-05-23 DIAGNOSIS — R296 Repeated falls: Secondary | ICD-10-CM | POA: Insufficient documentation

## 2022-05-23 DIAGNOSIS — F4321 Adjustment disorder with depressed mood: Secondary | ICD-10-CM | POA: Insufficient documentation

## 2022-05-23 DIAGNOSIS — F3289 Other specified depressive episodes: Secondary | ICD-10-CM | POA: Diagnosis not present

## 2022-05-23 DIAGNOSIS — R718 Other abnormality of red blood cells: Secondary | ICD-10-CM | POA: Insufficient documentation

## 2022-05-23 DIAGNOSIS — R748 Abnormal levels of other serum enzymes: Secondary | ICD-10-CM | POA: Diagnosis not present

## 2022-05-23 DIAGNOSIS — F4322 Adjustment disorder with anxiety: Secondary | ICD-10-CM | POA: Insufficient documentation

## 2022-05-23 DIAGNOSIS — K8020 Calculus of gallbladder without cholecystitis without obstruction: Secondary | ICD-10-CM | POA: Insufficient documentation

## 2022-05-23 MED ORDER — SERTRALINE HCL 100 MG PO TABS: 100.0000 mg | ORAL_TABLET | Freq: Every day | ORAL | 3 refills | Status: DC

## 2022-05-23 MED ORDER — TRAZODONE HCL 50 MG PO TABS: 100.0000 mg | ORAL_TABLET | Freq: Every evening | ORAL | 1 refills | Status: DC | PRN
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
## 2022-05-23 NOTE — Patient Instructions (Addendum)
Make an appt with your neurologist for your falls  Cholelithiasis  Cholelithiasis is a disease in which gallstones form in the gallbladder. The gallbladder is an organ that stores bile. Bile is a fluid that helps to digest fats. Gallstones begin as small crystals and can slowly grow into stones. They may cause no symptoms until they block the gallbladder duct, or cystic duct, when the gallbladder tightens (contracts) after food is eaten. This can cause pain and is known as a gallbladder attack, or biliary colic. There are two main types of gallstones: Cholesterol stones. These are the most common type of gallstone. These stones are made of hardened cholesterol and are usually yellow-green in color. Cholesterol is a fat-like substance that is made in the liver. Pigment stones. These are dark in color and are made of a red-yellow substance, called bilirubin,that forms when hemoglobin from red blood cells breaks down. What are the causes? This condition may be caused by an imbalance in the different parts that make bile. This can happen if the bile: Has too much bilirubin. This can happen in certain blood diseases, such as sickle cell anemia. Has too much cholesterol. Does not have enough bile salts. These salts help the body absorb and digest fats. In some cases, this condition can also be caused by the gallbladder not emptying completely or often enough. This is common during pregnancy. What increases the risk? The following factors may make you more likely to develop this condition: Being female. Having multiple pregnancies. Health care providers sometimes advise removing diseased gallbladders before future pregnancies. Eating a diet that is heavy in fried foods, fat, and refined carbohydrates, such as white bread and white rice. Being obese. Being older than age 98. Using medicines that contain female hormones (estrogen) for a long time. Losing weight quickly. Having a family history of  gallstones. Having certain medical problems, such as: Diabetes mellitus. Cystic fibrosis. Crohn's disease. Cirrhosis or other long-term (chronic) liver disease. Certain blood diseases, such as sickle cell anemia or leukemia. What are the signs or symptoms? In many cases, having gallstones causes no symptoms. When you have gallstones but do not have symptoms, you have silent gallstones. If a gallstone blocks your bile duct, it can cause a gallbladder attack. The main symptom of a gallbladder attack is sudden pain in the upper right part of the abdomen. The pain: Usually comes at night or after eating. Can last for one hour or more. Can spread to your right shoulder, back, or chest. Can feel like indigestion. This is discomfort, burning, or fullness in your upper abdomen. If the bile duct is blocked for more than a few hours, it can cause an infection or inflammation of your gallbladder (cholecystitis), liver, or pancreas. This can cause: Nausea or vomiting. Bloating. Pain in your abdomen that lasts for 5 hours or longer. Tenderness in your upper abdomen, often in the upper right section and under your rib cage. Fever or chills. Skin or the white parts of your eyes turning yellow (jaundice). This usually happens when a stone has blocked bile from passing through the common bile duct. Dark urine or light-colored stools. How is this diagnosed? This condition may be diagnosed based on: A physical exam. Your medical history. Ultrasound. CT scan. MRI. You may also have other tests, including: Blood tests to check for signs of an infection or inflammation. Cholescintigraphy, or HIDA scan. This is a scan of your gallbladder and bile ducts (biliary system) using non-harmful radioactive material and special cameras  that can see the radioactive material. Endoscopic retrograde cholangiopancreatogram. This involves inserting a small tube with a camera on the end (endoscope) through your mouth to  look at bile ducts and check for blockages. How is this treated? Treatment for this condition depends on the severity of the condition. Silent gallstones do not need treatment. Treatment may be needed if a blockage causes a gallbladder attack or other symptoms. Treatment may include: Home care, if symptoms are not severe. During a simple gallbladder attack, stop eating and drinking for 12-24 hours (except for water and clear liquids). This helps to "cool down" your gallbladder. After 1 or 2 days, you can start to eat a diet of simple or clear foods, such as broths and crackers. You may also need medicines for pain or nausea or both. If you have cholecystitis and an infection, you will need antibiotics. A hospital stay, if needed for pain control or for cholecystitis with severe infection. Cholecystectomy, or surgery to remove your gallbladder. This is the most common treatment if all other treatments have not worked. Medicines to break up gallstones. These are most effective at treating small gallstones. Medicines may be used for up to 6-12 months. Endoscopic retrograde cholangiopancreatogram. A small basket can be attached to the endoscope and used to capture and remove gallstones, mainly those that are in the common bile duct. Follow these instructions at home: Medicines Take over-the-counter and prescription medicines only as told by your health care provider. If you were prescribed an antibiotic medicine, take it as told by your health care provider. Do not stop taking the antibiotic even if you start to feel better. Ask your health care provider if the medicine prescribed to you requires you to avoid driving or using machinery. Eating and drinking Drink enough fluid to keep your urine pale yellow. This is important during a gallbladder attack. Water and clear liquids are preferred. Follow a healthy diet. This includes: Reducing fatty foods, such as fried food and foods high in  cholesterol. Reducing refined carbohydrates, such as white bread and white rice. Eating more fiber. Aim for foods such as almonds, fruit, and beans. Alcohol use If you drink alcohol: Limit how much you use to: 0-1 drink a day for nonpregnant women. 0-2 drinks a day for men. Be aware of how much alcohol is in your drink. In the [place] one drink equals one 12 oz bottle of beer (355 mL), one 5 oz glass of wine (148 mL), or one 1 oz glass of hard liquor (44 mL). General instructions Do not use any products that contain nicotine or tobacco, such as cigarettes, e-cigarettes, and chewing tobacco. If you need help quitting, ask your health care provider. Maintain a healthy weight. Keep all follow-up visits as told by your health care provider. These may include consultations with a surgeon or specialist. This is important. Where to find more information [employer] of Diabetes and Digestive and Kidney Diseases: [email or web address] Contact a health care provider if: You think you have had a gallbladder attack. You have been diagnosed with silent gallstones and you develop pain in your abdomen or indigestion. You begin to have attacks more often. You have dark urine or light-colored stools. Get help right away if: You have pain from a gallbladder attack that lasts for more than 2 hours. You have pain in your abdomen that lasts for more than 5 hours or is getting worse. You have a fever or chills. You have nausea and vomiting that do not go away.  You develop jaundice. Summary Cholelithiasis is a disease in which gallstones form in the gallbladder. This condition may be caused by an imbalance in the different parts that make bile. This can happen if your bile has too much bilirubin or cholesterol, or does not have enough bile salts. Treatment for gallstones depends on the severity of the condition. Silent gallstones do not need treatment. If gallstones cause a gallbladder attack or other  symptoms, treatment usually involves not eating or drinking anything. Treatment may also include pain medicines and antibiotics, and it sometimes includes a hospital stay. Surgery to remove the gallbladder is common if all other treatments have not worked. This information is not intended to replace advice given to you by your health care provider. Make sure you discuss any questions you have with your health care provider. Document Revised: 02/25/2019 Document Reviewed: 02/25/2019 Elsevier Patient Education  [place].

## 2022-05-23 NOTE — Telephone Encounter (Signed)
Referral Request - Has patient seen PCP for this complaint? yes *If NO, is insurance requiring patient see PCP for this issue before PCP can refer them? Referral for which specialty: neurologist Preferred provider/office: [facility] neurology. Dr Leta Baptist Reason for referral: falls

## 2022-05-24 ENCOUNTER — Telehealth: Payer: Self-pay | Admitting: Internal Medicine

## 2022-05-24 LAB — CBC WITH DIFFERENTIAL/PLATELET
Basophils Absolute: 0.1 10*3/uL (ref 0.0–0.2)
Basos: 2 %
EOS (ABSOLUTE): 0.1 10*3/uL (ref 0.0–0.4)
Eos: 2 %
Hematocrit: 38.2 % (ref 34.0–46.6)
Hemoglobin: 13.6 g/dL (ref 11.1–15.9)
Immature Grans (Abs): 0 10*3/uL (ref 0.0–0.1)
Immature Granulocytes: 0 %
Lymphocytes Absolute: 1.1 10*3/uL (ref 0.7–3.1)
Lymphs: 33 %
MCH: 36.8 pg — ABNORMAL HIGH (ref 26.6–33.0)
MCHC: 35.6 g/dL (ref 31.5–35.7)
MCV: 103 fL — ABNORMAL HIGH (ref 79–97)
Monocytes Absolute: 0.3 10*3/uL (ref 0.1–0.9)
Monocytes: 10 %
Neutrophils Absolute: 1.8 10*3/uL (ref 1.4–7.0)
Neutrophils: 53 %
Platelets: 293 10*3/uL (ref 150–450)
RBC: 3.7 x10E6/uL — ABNORMAL LOW (ref 3.77–5.28)
RDW: 14.4 % (ref 11.7–15.4)
WBC: 3.3 10*3/uL — ABNORMAL LOW (ref 3.4–10.8)

## 2022-05-24 LAB — HEPATIC FUNCTION PANEL
ALT: 5 [IU]/L (ref 0–32)
AST: 7 [IU]/L (ref 0–40)
Albumin: 4.5 g/dL (ref 3.9–4.9)
Alkaline Phosphatase: 71 [IU]/L (ref 44–121)
Bilirubin Total: 0.5 mg/dL (ref 0.0–1.2)
Bilirubin, Direct: 0.17 mg/dL (ref 0.00–0.40)
Total Protein: 6.5 g/dL (ref 6.0–8.5)

## 2022-05-24 LAB — GAMMA GT: GGT: 45 [IU]/L (ref 0–60)

## 2022-05-24 NOTE — Telephone Encounter (Signed)
Patient states she is doing some better from nieces recent death. Advised of Clonazepam refill. Nothing further needed.

## 2022-05-24 NOTE — Telephone Encounter (Signed)
Copied from [place]. Topic: Referral - Request for Referral >> May 24, 2022  4:27 PM Erskine Squibb wrote: Has patient seen PCP for this complaint? Yes.   Referral for which specialty: [facility] Preferred provider/office: Dr Andrey Spearman  Reason for referral: Several falls in a five day period. She hit her head twice on a coffee table  She states she spoke with [facility] and they told her each diagnosis requires a seperate referral even if the patient has already seen the provider there.

## 2022-05-24 NOTE — Telephone Encounter (Signed)
Clonazepam refilled 

## 2022-05-24 NOTE — Telephone Encounter (Signed)
Copied from [place] [id number]. Topic: General - Inquiry >> May 24, 2022  4:32 PM Erskine Squibb wrote: Reason for CRM: The patient is requesting a call to go over her lab results as soon as possible. Please assist patient further

## 2022-05-24 NOTE — Telephone Encounter (Signed)
Copied from [place]. Topic: Referral - Request for Referral >> May 24, 2022  4:27 PM Erskine Squibb wrote: Has patient seen PCP for this complaint? Yes.   Referral for which specialty: [facility] Preferred provider/office: Dr Andrey Spearman  Reason for referral: Several falls in a five day period. She hit her head twice on a coffee table  She states she spoke with [facility] and they told her each diagnosis requires a seperate referral even if the patient has already seen the provider there.

## 2022-05-24 NOTE — Telephone Encounter (Signed)
Noted  

## 2022-05-24 NOTE — Telephone Encounter (Signed)
Please follow up with Dr. Wynetta Emery. Although Levada Dy may be able to place a referral as well if she already spoke with her regarding the falls. THANKS!!

## 2022-05-24 NOTE — Telephone Encounter (Signed)
I don't see that in her chart. I saw where she had a appointment yesterday with Levada Dy.

## 2022-05-25 ENCOUNTER — Other Ambulatory Visit: Payer: Self-pay | Admitting: Physician Assistant

## 2022-05-25 ENCOUNTER — Other Ambulatory Visit: Payer: Self-pay | Admitting: Internal Medicine

## 2022-05-25 DIAGNOSIS — R296 Repeated falls: Secondary | ICD-10-CM

## 2022-05-25 NOTE — Telephone Encounter (Signed)
She requested to transfer care from me a while ago with her last visit with me in 2021. Thanks.

## 2022-05-25 NOTE — Telephone Encounter (Signed)
Noted. You  are welcome.

## 2022-05-26 NOTE — Telephone Encounter (Signed)
Called & spoke to the patient. Verified name & DOB. Informed patient of results. Patient expressed verbal understanding. [name] would like Dr.Johnson to know that the referral sent to [facility] was received and that office has reached out & she spoke with them yesterday. She will also be seeing her regular neurologist in February Confirmed appointment on 08/22/2022 with Dr.Johnson.

## 2022-05-26 NOTE — Telephone Encounter (Signed)
noted 

## 2022-05-27 ENCOUNTER — Telehealth: Payer: Self-pay

## 2022-05-27 NOTE — Telephone Encounter (Signed)
Pt has appt with neurologist later this mth for balance issue.

## 2022-05-27 NOTE — Telephone Encounter (Signed)
Called patient and she is already aware of results

## 2022-05-27 NOTE — Telephone Encounter (Signed)
-----   Message from Carilyn Goodpasture, RN sent at 05/27/2022  3:12 PM EST -----  ----- Message ----- From: Argentina Donovan, PA-C Sent: 05/25/2022   5:20 PM EST To: Carilyn Goodpasture, RN  Please call patient.  Her labs are much improved since being seen at the hospital.  Liver function tests are completely back to normal.  Her red blood cells are closer to normal size now(they were a little enlarged).  This is all very reassuring.  GGT test is also normal which is another liver test.  Drink plenty of water and follow up as planned.  Thanks! Freeman Caldron, PA-C

## 2022-06-03 DIAGNOSIS — N3010 Interstitial cystitis (chronic) without hematuria: Secondary | ICD-10-CM | POA: Diagnosis not present

## 2022-06-03 DIAGNOSIS — G894 Chronic pain syndrome: Secondary | ICD-10-CM | POA: Diagnosis not present

## 2022-06-03 DIAGNOSIS — Z79891 Long term (current) use of opiate analgesic: Secondary | ICD-10-CM | POA: Diagnosis not present

## 2022-06-03 DIAGNOSIS — R1084 Generalized abdominal pain: Secondary | ICD-10-CM | POA: Diagnosis not present

## 2022-06-03 DIAGNOSIS — G890 Central pain syndrome: Secondary | ICD-10-CM | POA: Diagnosis not present

## 2022-06-03 NOTE — Telephone Encounter (Signed)
Referral has been sent to [facility] [place]  [address] [place]  [place], [place] [address] Ph# [phone]

## 2022-06-13 ENCOUNTER — Encounter: Payer: Self-pay | Admitting: Diagnostic Neuroimaging

## 2022-06-13 ENCOUNTER — Ambulatory Visit (INDEPENDENT_AMBULATORY_CARE_PROVIDER_SITE_OTHER): Payer: Medicare Other | Admitting: Diagnostic Neuroimaging

## 2022-06-13 VITALS — BP 92/52 | HR 80 | Ht 65.5 in | Wt 126.0 lb

## 2022-06-13 DIAGNOSIS — R569 Unspecified convulsions: Secondary | ICD-10-CM

## 2022-06-13 DIAGNOSIS — R269 Unspecified abnormalities of gait and mobility: Secondary | ICD-10-CM | POA: Diagnosis not present

## 2022-06-13 MED ORDER — LAMOTRIGINE 100 MG PO TABS: ORAL_TABLET | ORAL | 4 refills | Status: DC

## 2022-06-13 NOTE — Patient Instructions (Addendum)
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
## 2022-06-16 ENCOUNTER — Other Ambulatory Visit: Payer: Self-pay | Admitting: Internal Medicine

## 2022-06-16 DIAGNOSIS — Z1231 Encounter for screening mammogram for malignant neoplasm of breast: Secondary | ICD-10-CM

## 2022-06-23 ENCOUNTER — Ambulatory Visit: Payer: Medicare Other | Admitting: Internal Medicine

## 2022-06-23 ENCOUNTER — Ambulatory Visit: Payer: Medicare Other | Admitting: Physician Assistant

## 2022-06-23 DIAGNOSIS — K8020 Calculus of gallbladder without cholecystitis without obstruction: Secondary | ICD-10-CM | POA: Diagnosis not present

## 2022-06-23 DIAGNOSIS — K8050 Calculus of bile duct without cholangitis or cholecystitis without obstruction: Secondary | ICD-10-CM | POA: Diagnosis not present

## 2022-06-24 ENCOUNTER — Other Ambulatory Visit (HOSPITAL_COMMUNITY): Payer: Self-pay | Admitting: General Surgery

## 2022-06-24 DIAGNOSIS — K8040 Calculus of bile duct with cholecystitis, unspecified, without obstruction: Secondary | ICD-10-CM

## 2022-07-01 DIAGNOSIS — Z79891 Long term (current) use of opiate analgesic: Secondary | ICD-10-CM | POA: Diagnosis not present

## 2022-07-01 DIAGNOSIS — G894 Chronic pain syndrome: Secondary | ICD-10-CM | POA: Diagnosis not present

## 2022-07-01 DIAGNOSIS — N3010 Interstitial cystitis (chronic) without hematuria: Secondary | ICD-10-CM | POA: Diagnosis not present

## 2022-07-01 DIAGNOSIS — R1084 Generalized abdominal pain: Secondary | ICD-10-CM | POA: Diagnosis not present

## 2022-07-01 DIAGNOSIS — G890 Central pain syndrome: Secondary | ICD-10-CM | POA: Diagnosis not present

## 2022-07-04 ENCOUNTER — Encounter (HOSPITAL_COMMUNITY): Payer: Self-pay

## 2022-07-04 ENCOUNTER — Other Ambulatory Visit: Payer: Self-pay | Admitting: Obstetrics and Gynecology

## 2022-07-04 DIAGNOSIS — N951 Menopausal and female climacteric states: Secondary | ICD-10-CM

## 2022-07-04 NOTE — Patient Instructions (Addendum)
SURGICAL WAITING ROOM VISITATION Patients having surgery or a procedure may have no more than 2 support people in the waiting area - these visitors may rotate in the visitor waiting room.   Due to an increase in RSV and influenza rates and associated hospitalizations, children ages 35 and under may not visit patients in [place]. If the patient needs to stay at the hospital during part of their recovery, the visitor guidelines for inpatient rooms apply.  PRE-OP VISITATION  Pre-op nurse will coordinate an appropriate time for 1 support person to accompany the patient in pre-op.  This support person may not rotate.  This visitor will be contacted when the time is appropriate for the visitor to come back in the pre-op area.  Please refer to the [facility] website for the visitor guidelines for Inpatients (after your surgery is over and you are in a regular room).  You are not required to quarantine at this time prior to your surgery. However, you must do this: Hand Hygiene often Do NOT share personal items Notify your provider if you are in close contact with someone who has COVID or you develop fever 100.4 or greater, new onset of sneezing, cough, sore throat, shortness of breath or body aches.  If you test positive for Covid or have been in contact with anyone that has tested positive in the last 10 days please notify you surgeon.    Your procedure is scheduled on:  Thursday  July 07, 2022  Report to [facility] Main Entrance: [place] entrance where the [employer] is available.   Report to admitting at: 09:15    AM  +++++Call this number if you have any questions or problems the morning of surgery [phone]  Do not eat food after Midnight the night prior to your surgery/procedure.  After Midnight you may have the following liquids until   08:30  AM DAY OF SURGERY  Clear Liquid Diet Water Black Coffee (sugar ok, NO MILK/CREAM OR CREAMERS)  Tea (sugar ok,  NO MILK/CREAM OR CREAMERS) regular and decaf                             Plain Jell-O  with no fruit (NO RED)                                           Fruit ices (not with fruit pulp, NO RED)                                     Popsicles (NO RED)                                                                  Juice: apple, WHITE grape, WHITE cranberry Sports drinks like Gatorade or Powerade (NO RED)                   The day of surgery:  Drink ONE (1) Pre-Surgery Clear Ensure or G2 at 08:30  AM the morning of surgery. Drink  in one sitting. Do not sip.  This drink was given to you during your hospital pre-op appointment visit. Nothing else to drink after completing the Pre-Surgery Clear Ensure or G2 : No candy, chewing gum or throat lozenges.    FOLLOW BOWEL PREP AND ANY ADDITIONAL PRE OP INSTRUCTIONS YOU RECEIVED FROM YOUR SURGEON'S OFFICE!!!   Oral Hygiene is also important to reduce your risk of infection.        Remember - BRUSH YOUR TEETH THE MORNING OF SURGERY WITH YOUR REGULAR TOOTHPASTE  Do NOT smoke after Midnight the night before surgery.  Take ONLY these medicines the morning of surgery with A SIP OF WATER: Lamotrigine (Lamictal), sertraline (Zoloft), azathioprine (Imuran). You may take clonazepam, oxycodone if needed. You may use Breztri / Albuterol inhalers and your Eye Drops if needed.    If You have been diagnosed with Sleep Apnea - Bring CPAP mask and tubing day of surgery. We will provide you with a CPAP machine on the day of your surgery.                   You may not have any metal on your body including hair pins, jewelry, and body piercing  Do not wear make-up, lotions, powders, perfumes or deodorant  Do not wear nail polish including gel and S&S, artificial / acrylic nails, or any other type of covering on natural nails including finger and toenails. If you have artificial nails, gel coating, etc., that needs to be removed by a nail salon, Please have this  removed prior to surgery. Not doing so may mean that your surgery could be cancelled or delayed if the Surgeon or anesthesia staff feels like they are unable to monitor you safely.   Do not shave 48 hours prior to surgery to avoid nicks in your skin which may contribute to postoperative infections.   Contacts, Hearing Aids, dentures or bridgework may not be worn into surgery. DENTURES WILL BE REMOVED PRIOR TO SURGERY PLEASE DO NOT APPLY "Poly grip" OR ADHESIVES!!!  You may bring a small overnight bag with you on the day of surgery, only pack items that are not valuable. [facility] IS NOT RESPONSIBLE   FOR VALUABLES THAT ARE LOST OR STOLEN.   Do not bring your home medications to the hospital EXCEPT YOUR ALBUTEROL INHALER. The Pharmacy will dispense medications listed on your medication list to you during your admission in the Hospital.  Please read over the following fact sheets you were given: IF YOU HAVE [place], [place] [phone].   [facility] - Preparing for Surgery Before surgery, you can play an important role.  Because skin is not sterile, your skin needs to be as free of germs as possible.  You can reduce the number of germs on your skin by washing with CHG (chlorahexidine gluconate) soap before surgery.  CHG is an antiseptic cleaner which kills germs and bonds with the skin to continue killing germs even after washing. Please DO NOT use if you have an allergy to CHG or antibacterial soaps.  If your skin becomes reddened/irritated stop using the CHG and inform your nurse when you arrive at Short Stay. Do not shave (including legs and underarms) for at least 48 hours prior to the first CHG shower.  You may shave your face/neck.  Please follow these instructions carefully:  1.  Shower with CHG Soap the night before surgery and the  morning of surgery.  2.  If you  choose to wash your hair, wash your hair first as usual with your normal  shampoo.  3.   After you shampoo, rinse your hair and body thoroughly to remove the shampoo.                             4.  Use CHG as you would any other liquid soap.  You can apply chg directly to the skin and wash.  Gently with a scrungie or clean washcloth.  5.  Apply the CHG Soap to your body ONLY FROM THE NECK DOWN.   Do not use on face/ open                           Wound or open sores. Avoid contact with eyes, ears mouth and genitals (private parts).                       Wash face,  Genitals (private parts) with your normal soap.             6.  Wash thoroughly, paying special attention to the area where your  surgery  will be performed.  7.  Thoroughly rinse your body with warm water from the neck down.  8.  DO NOT shower/wash with your normal soap after using and rinsing off the CHG Soap.            9.  Pat yourself dry with a clean towel.            10.  Wear clean pajamas.            11.  Place clean sheets on your bed the night of your first shower and do not  sleep with pets.  ON THE DAY OF SURGERY : Do not apply any lotions/deodorants the morning of surgery.  Please wear clean clothes to the hospital/surgery center.    FAILURE TO FOLLOW THESE INSTRUCTIONS MAY RESULT IN THE CANCELLATION OF YOUR SURGERY  PATIENT SIGNATURE_________________________________  NURSE SIGNATURE__________________________________  ________________________________________________________________________

## 2022-07-04 NOTE — Progress Notes (Signed)
COVID Vaccine received:  []  No [x]  Yes Date of any COVID positive Test in last 90 days:  PCP - Karle Plumber, MD  Freeman Caldron, PA-C at [facility].  Cardiologist - Oswaldo Milian, MD Neurology-   Andrey Spearman, MD Pulmonology- Baird Lyons MD Pain Medicine- Dr. Shanon Ace at [facility]  [phone]  Chest x-ray - 02-06-2022 2v  epic EKG -03-10-2020 epic,   will repeat at PST   Stress Test -  ECHO - 04-06-2020  epic Cardiac Cath -  CT cardiac score-  0  on 06-15-2020  epic  PCR screen: [x]  Ordered & Completed  Hx of MRSA                      []   No Order but Needs PROFEND                      []   N/A for this surgery  Surgery Plan:  []  Ambulatory                            [x]  Outpatient in bed                            []  Admit  Anesthesia:    [x]  General  []  Spinal                           []   Choice []   MAC  Bowel Prep - []  No  []   Yes ______  Pacemaker / ICD device [x]  No []  Yes        Device order form faxed [x]  No    []   Yes      Faxed to:  Spinal Cord Stimulator:[x]  No []  Yes      (Remind patient to bring remote DOS) Other Implants:   History of Sleep Apnea? []  No [x]  Yes   CPAP used?- []  No []  Yes    Does the patient monitor blood sugar? []  No []  Yes  [x]  N/A  Patient has: []  Pre-DM   []  DM1  []   DM2 Does patient have a [employer] or Dexacom? []  No []  Yes   Fasting Blood Sugar Ranges-  Checks Blood Sugar _____ times a day  Blood Thinner / Instructions:  none Aspirin Instructions:  none  ERAS Protocol Ordered: []  No  []  Yes PRE-SURGERY []  ENSURE  []  G2  []  No Drink Ordered  Patient is to be NPO after:   Comments: NO Surgery orders as of 07-04-22 at 1530  Activity level: Patient is able / unable to climb a flight of stairs without difficulty; []  No CP  []  No SOB, but would have ___   Patient can / can not perform ADLs without assistance.   Anesthesia review: Difficult intubation, COPD,GERD,  smoker, Hx ETOH/ cocaine abuse, seizure- last 2016, anxiety, OSA- ??  Patient denies shortness of breath, fever, cough and chest pain at PAT appointment.  Patient verbalized understanding and agreement to the Pre-Surgical Instructions that were given to them at this PAT appointment. Patient was also educated of the need to review these PAT instructions again prior to her surgery.I reviewed the appropriate phone numbers to call if they have any and questions or concerns.

## 2022-07-05 ENCOUNTER — Ambulatory Visit (HOSPITAL_COMMUNITY)
Admission: RE | Admit: 2022-07-05 | Discharge: 2022-07-05 | Disposition: A | Discharge status: Home / Self Care | Payer: Medicare Other | Source: Ambulatory Visit | Attending: General Surgery | Admitting: General Surgery

## 2022-07-05 ENCOUNTER — Encounter (HOSPITAL_COMMUNITY)
Admission: RE | Admit: 2022-07-05 | Discharge: 2022-07-05 | Disposition: A | Discharge status: Home / Self Care | Payer: Medicare Other | Source: Ambulatory Visit

## 2022-07-05 ENCOUNTER — Ambulatory Visit: Payer: Self-pay | Admitting: General Surgery

## 2022-07-05 ENCOUNTER — Other Ambulatory Visit (HOSPITAL_COMMUNITY): Payer: Self-pay | Admitting: General Surgery

## 2022-07-05 ENCOUNTER — Other Ambulatory Visit: Payer: Self-pay

## 2022-07-05 ENCOUNTER — Other Ambulatory Visit: Payer: Self-pay | Admitting: Gastroenterology

## 2022-07-05 ENCOUNTER — Encounter (HOSPITAL_COMMUNITY): Payer: Self-pay

## 2022-07-05 VITALS — BP 99/65 | HR 69 | Temp 98.3°F | Resp 20 | Ht 65.5 in | Wt 126.0 lb

## 2022-07-05 DIAGNOSIS — F1910 Other psychoactive substance abuse, uncomplicated: Secondary | ICD-10-CM

## 2022-07-05 DIAGNOSIS — K8040 Calculus of bile duct with cholecystitis, unspecified, without obstruction: Secondary | ICD-10-CM | POA: Insufficient documentation

## 2022-07-05 DIAGNOSIS — Z8614 Personal history of Methicillin resistant Staphylococcus aureus infection: Secondary | ICD-10-CM | POA: Diagnosis present

## 2022-07-05 DIAGNOSIS — Z01818 Encounter for other preprocedural examination: Secondary | ICD-10-CM | POA: Diagnosis present

## 2022-07-05 DIAGNOSIS — K769 Liver disease, unspecified: Secondary | ICD-10-CM | POA: Diagnosis present

## 2022-07-05 DIAGNOSIS — K8050 Calculus of bile duct without cholangitis or cholecystitis without obstruction: Secondary | ICD-10-CM | POA: Diagnosis not present

## 2022-07-05 DIAGNOSIS — K8070 Calculus of gallbladder and bile duct without cholecystitis without obstruction: Secondary | ICD-10-CM | POA: Diagnosis not present

## 2022-07-05 HISTORY — DX: Failed or difficult intubation, initial encounter: T88.4XXA

## 2022-07-05 HISTORY — DX: Unspecified osteoarthritis, unspecified site: M19.90

## 2022-07-05 LAB — COMPREHENSIVE METABOLIC PANEL WITH GFR
ALT: 11 U/L (ref 0–44)
AST: 14 U/L — ABNORMAL LOW (ref 15–41)
Albumin: 4.3 g/dL (ref 3.5–5.0)
Alkaline Phosphatase: 53 U/L (ref 38–126)
Anion gap: 6 (ref 5–15)
BUN: 13 mg/dL (ref 8–23)
CO2: 24 mmol/L (ref 22–32)
Calcium: 8.6 mg/dL — ABNORMAL LOW (ref 8.9–10.3)
Chloride: 109 mmol/L (ref 98–111)
Creatinine, Ser: 0.81 mg/dL (ref 0.44–1.00)
GFR, Estimated: >60 mL/min
Glucose, Bld: 95 mg/dL (ref 70–99)
Potassium: 4 mmol/L (ref 3.5–5.1)
Sodium: 139 mmol/L (ref 135–145)
Total Bilirubin: 0.7 mg/dL (ref 0.3–1.2)
Total Protein: 6.6 g/dL (ref 6.5–8.1)

## 2022-07-05 LAB — SURGICAL PCR SCREEN
MRSA, PCR: NEGATIVE
Staphylococcus aureus: NEGATIVE

## 2022-07-05 LAB — CBC
HCT: 39.1 % (ref 36.0–46.0)
Hemoglobin: 13.5 g/dL (ref 12.0–15.0)
MCH: 38.4 pg — ABNORMAL HIGH (ref 26.0–34.0)
MCHC: 34.5 g/dL (ref 30.0–36.0)
MCV: 111.1 fL — ABNORMAL HIGH (ref 80.0–100.0)
Platelets: 217 10*3/uL (ref 150–400)
RBC: 3.52 MIL/uL — ABNORMAL LOW (ref 3.87–5.11)
RDW: 15.9 % — ABNORMAL HIGH (ref 11.5–15.5)
WBC: 4 10*3/uL (ref 4.0–10.5)
nRBC: 0 % (ref 0.0–0.2)

## 2022-07-05 MED ORDER — GADOBUTROL 1 MMOL/ML IV SOLN
5.7000 mL | Freq: Once | INTRAVENOUS | Status: AC | PRN
  Administered 2022-07-05: 5.7 mL via INTRAVENOUS

## 2022-07-05 MED ORDER — SPY AGENT GREEN - (INDOCYANINE FOR INJECTION): 1.2500 mg | Freq: Once | INTRAMUSCULAR | Status: AC

## 2022-07-06 NOTE — Anesthesia Preprocedure Evaluation (Addendum)
Anesthesia Evaluation  Patient identified by MRN, date of birth, ID band Patient awake    Reviewed: Allergy & Precautions, NPO status , Patient's Chart, lab work & pertinent test results  Airway Mallampati: II  TM Distance: >3 FB Neck ROM: Full    Dental no notable dental hx. (+) Partial Upper, Missing,    Pulmonary Current Smoker   Pulmonary exam normal breath sounds clear to auscultation       Cardiovascular Normal cardiovascular exam Rhythm:Regular Rate:Normal  03/2020 echo  1. Left ventricular ejection fraction, by estimation, is 60 to 65%. The  left ventricle has normal function. The left ventricle has no regional  wall motion abnormalities. Left ventricular diastolic parameters are  consistent with Grade I diastolic  dysfunction (impaired relaxation). Elevated left ventricular end-diastolic  pressure.   2. Right ventricular systolic function is normal. The right ventricular  size is normal. There is mildly elevated pulmonary artery systolic  pressure.   3. The mitral valve is normal in structure. Trivial mitral valve  regurgitation. No evidence of mitral stenosis.   4. The aortic valve is tricuspid. Aortic valve regurgitation is not  visualized. No aortic stenosis is present.   5. The inferior vena cava is normal in size with greater than 50%  respiratory variability, suggesting right atrial pressure of 3 mmHg.     Neuro/Psych Seizures -,  PSYCHIATRIC DISORDERS Anxiety Depression       GI/Hepatic ,GERD  ,,(+)     substance abuse  cocaine useHx of Etoh abuse in past   Endo/Other    Renal/GU      Musculoskeletal  (+) Arthritis ,    Abdominal   Peds  Hematology Lab Results      Component                Value               Date                      WBC                      4.0                 07/05/2022                HGB                      13.5                07/05/2022                HCT                       39.1                07/05/2022                MCV                      111.1 (H)           07/05/2022                PLT                      217  07/05/2022              Anesthesia Other Findings ALL: Haldol, oxybutynin  Reproductive/Obstetrics                             Anesthesia Physical Anesthesia Plan  ASA: 3  Anesthesia Plan: General   Post-op Pain Management:    Induction: Intravenous  PONV Risk Score and Plan: Treatment may vary due to age or medical condition and Ondansetron  Airway Management Planned: Oral ETT  Additional Equipment: None  Intra-op Plan:   Post-operative Plan: Extubation in OR  Informed Consent: I have reviewed the patients History and Physical, chart, labs and discussed the procedure including the risks, benefits and alternatives for the proposed anesthesia with the patient or authorized representative who has indicated his/her understanding and acceptance.     Dental advisory given  Plan Discussed with: CRNA and Anesthesiologist  Anesthesia Plan Comments:        Anesthesia Quick Evaluation

## 2022-07-07 ENCOUNTER — Other Ambulatory Visit: Payer: Self-pay | Admitting: Obstetrics and Gynecology

## 2022-07-07 ENCOUNTER — Ambulatory Visit (HOSPITAL_COMMUNITY)
Admission: RE | Admit: 2022-07-07 | Discharge: 2022-07-07 | Disposition: A | Discharge status: Home / Self Care | Payer: Medicare Other | Attending: Gastroenterology | Admitting: Gastroenterology

## 2022-07-07 ENCOUNTER — Ambulatory Visit (HOSPITAL_COMMUNITY): Payer: Medicare Other | Admitting: Anesthesiology

## 2022-07-07 ENCOUNTER — Ambulatory Visit (HOSPITAL_COMMUNITY): Payer: Medicare Other

## 2022-07-07 ENCOUNTER — Encounter (HOSPITAL_COMMUNITY)
Admission: RE | Disposition: A | Discharge status: Home / Self Care | Payer: Self-pay | Source: Home / Self Care | Attending: Gastroenterology

## 2022-07-07 ENCOUNTER — Other Ambulatory Visit: Payer: Self-pay

## 2022-07-07 ENCOUNTER — Ambulatory Visit (HOSPITAL_BASED_OUTPATIENT_CLINIC_OR_DEPARTMENT_OTHER): Payer: Medicare Other | Admitting: Anesthesiology

## 2022-07-07 ENCOUNTER — Encounter (HOSPITAL_COMMUNITY): Payer: Self-pay | Admitting: Gastroenterology

## 2022-07-07 DIAGNOSIS — M1990 Unspecified osteoarthritis, unspecified site: Secondary | ICD-10-CM | POA: Diagnosis not present

## 2022-07-07 DIAGNOSIS — K8050 Calculus of bile duct without cholangitis or cholecystitis without obstruction: Secondary | ICD-10-CM

## 2022-07-07 DIAGNOSIS — Z79899 Other long term (current) drug therapy: Secondary | ICD-10-CM | POA: Diagnosis not present

## 2022-07-07 DIAGNOSIS — F17210 Nicotine dependence, cigarettes, uncomplicated: Secondary | ICD-10-CM | POA: Diagnosis not present

## 2022-07-07 DIAGNOSIS — Z8614 Personal history of Methicillin resistant Staphylococcus aureus infection: Secondary | ICD-10-CM

## 2022-07-07 DIAGNOSIS — F1910 Other psychoactive substance abuse, uncomplicated: Secondary | ICD-10-CM

## 2022-07-07 DIAGNOSIS — F418 Other specified anxiety disorders: Secondary | ICD-10-CM | POA: Insufficient documentation

## 2022-07-07 DIAGNOSIS — Z01818 Encounter for other preprocedural examination: Secondary | ICD-10-CM

## 2022-07-07 DIAGNOSIS — N951 Menopausal and female climacteric states: Secondary | ICD-10-CM

## 2022-07-07 DIAGNOSIS — R932 Abnormal findings on diagnostic imaging of liver and biliary tract: Secondary | ICD-10-CM | POA: Diagnosis not present

## 2022-07-07 DIAGNOSIS — K769 Liver disease, unspecified: Secondary | ICD-10-CM

## 2022-07-07 DIAGNOSIS — R569 Unspecified convulsions: Secondary | ICD-10-CM | POA: Diagnosis not present

## 2022-07-07 DIAGNOSIS — K838 Other specified diseases of biliary tract: Secondary | ICD-10-CM | POA: Diagnosis not present

## 2022-07-07 HISTORY — PX: ENDOSCOPIC RETROGRADE CHOLANGIOPANCREATOGRAPHY (ERCP) WITH PROPOFOL: SHX5810

## 2022-07-07 HISTORY — PX: SPHINCTEROTOMY: SHX5544

## 2022-07-07 HISTORY — PX: REMOVAL OF STONES: SHX5545

## 2022-07-07 SURGERY — ENDOSCOPIC RETROGRADE CHOLANGIOPANCREATOGRAPHY (ERCP) WITH PROPOFOL
Anesthesia: General

## 2022-07-07 SURGERY — LAPAROSCOPIC CHOLECYSTECTOMY WITH INTRAOPERATIVE CHOLANGIOGRAM
Anesthesia: General

## 2022-07-07 MED ORDER — GLYCOPYRROLATE PF 0.2 MG/ML IJ SOSY
PREFILLED_SYRINGE | INTRAMUSCULAR | Status: DC | PRN
  Administered 2022-07-07: .4 mg via INTRAVENOUS

## 2022-07-07 MED ORDER — ROCURONIUM BROMIDE 10 MG/ML (PF) SYRINGE
PREFILLED_SYRINGE | INTRAVENOUS | Status: DC | PRN
  Administered 2022-07-07: 40 mg via INTRAVENOUS

## 2022-07-07 MED ORDER — ONDANSETRON HCL 4 MG/2ML IJ SOLN
INTRAMUSCULAR | Status: DC | PRN
  Administered 2022-07-07: 4 mg via INTRAVENOUS

## 2022-07-07 MED ORDER — SODIUM CHLORIDE 0.9 % IV SOLN: INTRAVENOUS | Status: DC

## 2022-07-07 MED ORDER — GLUCAGON HCL RDNA (DIAGNOSTIC) 1 MG IJ SOLR
INTRAMUSCULAR | Status: AC
  Filled 2022-07-07: qty 1

## 2022-07-07 MED ORDER — PHENYLEPHRINE 80 MCG/ML (10ML) SYRINGE FOR IV PUSH (FOR BLOOD PRESSURE SUPPORT)
PREFILLED_SYRINGE | INTRAVENOUS | Status: DC | PRN
  Administered 2022-07-07 (×2): 80 ug via INTRAVENOUS
  Administered 2022-07-07: 160 ug via INTRAVENOUS

## 2022-07-07 MED ORDER — CIPROFLOXACIN IN D5W 400 MG/200ML IV SOLN
INTRAVENOUS | Status: DC | PRN
  Administered 2022-07-07: 400 mg via INTRAVENOUS

## 2022-07-07 MED ORDER — LIDOCAINE 2% (20 MG/ML) 5 ML SYRINGE
INTRAMUSCULAR | Status: DC | PRN
  Administered 2022-07-07: 40 mg via INTRAVENOUS
  Administered 2022-07-07: 60 mg via INTRAVENOUS

## 2022-07-07 MED ORDER — DEXAMETHASONE SODIUM PHOSPHATE 10 MG/ML IJ SOLN
INTRAMUSCULAR | Status: DC | PRN
  Administered 2022-07-07: 10 mg via INTRAVENOUS

## 2022-07-07 MED ORDER — MIDAZOLAM HCL 2 MG/2ML IJ SOLN
INTRAMUSCULAR | Status: DC | PRN
  Administered 2022-07-07 (×2): 1 mg via INTRAVENOUS

## 2022-07-07 MED ORDER — DICLOFENAC SUPPOSITORY 100 MG
RECTAL | Status: DC | PRN
  Administered 2022-07-07: 100 mg via RECTAL

## 2022-07-07 MED ORDER — FENTANYL CITRATE (PF) 250 MCG/5ML IJ SOLN
INTRAMUSCULAR | Status: DC | PRN
  Administered 2022-07-07: 50 ug via INTRAVENOUS
  Administered 2022-07-07 (×2): 25 ug via INTRAVENOUS

## 2022-07-07 MED ORDER — LACTATED RINGERS IV SOLN: INTRAVENOUS | Status: DC

## 2022-07-07 MED ORDER — DICLOFENAC SUPPOSITORY 100 MG
RECTAL | Status: AC
  Filled 2022-07-07: qty 1

## 2022-07-07 MED ORDER — SODIUM CHLORIDE 0.9 % IV SOLN
INTRAVENOUS | Status: DC | PRN
  Administered 2022-07-07: 42 mL

## 2022-07-07 MED ORDER — CIPROFLOXACIN IN D5W 400 MG/200ML IV SOLN
INTRAVENOUS | Status: AC
  Filled 2022-07-07: qty 200

## 2022-07-07 MED ORDER — PROPOFOL 10 MG/ML IV BOLUS
INTRAVENOUS | Status: DC | PRN
  Administered 2022-07-07: 150 mg via INTRAVENOUS
  Administered 2022-07-07: 30 mg via INTRAVENOUS

## 2022-07-07 MED ORDER — NEOSTIGMINE METHYLSULFATE 3 MG/3ML IV SOSY
PREFILLED_SYRINGE | INTRAVENOUS | Status: DC | PRN
  Administered 2022-07-07: 3 mg via INTRAVENOUS

## 2022-07-07 MED ORDER — INDOMETHACIN 50 MG RE SUPP
RECTAL | Status: AC
  Filled 2022-07-07: qty 2

## 2022-07-07 NOTE — Anesthesia Postprocedure Evaluation (Signed)
Anesthesia Post Note  Patient: [name]  Procedure(s) Performed: ENDOSCOPIC RETROGRADE CHOLANGIOPANCREATOGRAPHY (ERCP) WITH PROPOFOL SPHINCTEROTOMY REMOVAL OF STONES     Patient location during evaluation: PACU Anesthesia Type: General Level of consciousness: awake and alert Pain management: pain level controlled Vital Signs Assessment: post-procedure vital signs reviewed and stable Respiratory status: spontaneous breathing, nonlabored ventilation, respiratory function stable and patient connected to nasal cannula oxygen Cardiovascular status: blood pressure returned to baseline and stable Postop Assessment: no apparent nausea or vomiting Anesthetic complications: no  No notable events documented.  Last Vitals:  Vitals:   07/07/22 0938 07/07/22 0953  BP: 94/60 (!) 104/52  Pulse: 71 78  Resp: 14 18  Temp:  37.2 C  SpO2: 93% 93%    Last Pain:  Vitals:   07/07/22 0923  TempSrc:   PainSc: 0-No pain                 Barnet Glasgow

## 2022-07-07 NOTE — Anesthesia Procedure Notes (Signed)
Procedure Name: Intubation Date/Time: 07/07/2022 7:55 AM  Performed by: Betha Loa, CRNAPre-anesthesia Checklist: Patient identified, Emergency Drugs available, Suction available and Patient being monitored Patient Re-evaluated:Patient Re-evaluated prior to induction Oxygen Delivery Method: Circle System Utilized Preoxygenation: Pre-oxygenation with 100% oxygen Induction Type: IV induction Ventilation: Mask ventilation without difficulty Laryngoscope Size: Mac and 3 Grade View: Grade II Tube type: Oral Tube size: 7.0 mm Number of attempts: 1 Airway Equipment and Method: Stylet Placement Confirmation: ETT inserted through vocal cords under direct vision, positive ETCO2 and breath sounds checked- equal and bilateral Secured at: 22 cm Tube secured with: Tape Dental Injury: Teeth and Oropharynx as per pre-operative assessment  Comments: Pt with h/o difficult airway.  Unable to describe or recall surgery or events related to "difficult airway". Smooth IV induction, Easy mask, G2 view with MAC 3 and atraumatic intubation.  Possible difficult airway secondary to dentition and large prominent teeth on upper Rt.
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
## 2022-07-07 NOTE — Op Note (Signed)
[facility] Patient Name: [name] Procedure Date : 07/07/2022 MRN: [id number] Attending MD: Ronnette Juniper , MD, [id number] Date of Birth: 1951-03-04 CSN: [id number] Age: 72 Admit Type: Outpatient Procedure:                ERCP Indications:              Common bile duct stone(s), Common hepatic duct                            stone(s), Abnormal MRCP Providers:                Ronnette Juniper, MD, Dulcy Fanny, Benetta Spar,                            Technician Referring MD:             Dr.Eric Redmond Pulling ([facility]) Medicines:                See the Anesthesia note for documentation of the                            administered medications Complications:            No immediate complications. Estimated blood loss:                            Minimal. Estimated Blood Loss:     Estimated blood loss was minimal. Procedure:                Pre-Anesthesia Assessment:                           - Prior to the procedure, a History and Physical                            was performed, and patient medications and                            allergies were reviewed. The patient's tolerance of                            previous anesthesia was also reviewed. The risks                            and benefits of the procedure and the sedation                            options and risks were discussed with the patient.                            All questions were answered, and informed consent                            was obtained. Prior Anticoagulants: The patient has                            taken no  anticoagulant or antiplatelet agents. ASA                            Grade Assessment: III - A patient with severe                            systemic disease. After reviewing the risks and                            benefits, the patient was deemed in satisfactory                            condition to undergo the procedure.                           After obtaining informed consent, the  scope was                            passed under direct vision. Throughout the                            procedure, the patient's blood pressure, pulse, and                            oxygen saturations were monitored continuously. The                            [id number] Olympus duodenoscope was                            introduced through the mouth, and used to inject                            contrast into and used to inject contrast into the                            bile duct. The ERCP was somewhat difficult due to                            challenging cannulation because of inadequate scope                            positioning and significant looping. The patient                            tolerated the procedure well. Scope In: Scope Out: Findings:      The scout film was normal. The esophagus was successfully intubated       under direct vision. The scope was advanced to a normal major papilla in       the descending duodenum without detailed examination of the pharynx,       larynx and associated structures, and upper GI tract. The upper GI tract       was grossly normal.      Positioning was challenging due  to significant looping.      The bile duct was deeply cannulated with the sphincterotome.The       pancreatic duct was not canulated or injected.      Contrast was injected. I personally interpreted the bile duct images.       Ductal flow of contrast was adequate. Image quality was excellent.       Contrast extended to the main bile duct.      The lower third of the main bile duct, middle third of the main bile       duct, upper third of the main bile duct and common hepatic duct       contained multiple stones, the largest of which was 4 mm in diameter.      The main bile duct was moderately dilated.      A straight Roadrunner wire was passed into the biliary tree.      Patient appeared to have thick gravel like sludge and multiple small       stones.       Initially, it was difficult to advance the wire beyond the CHD, due to       significant looping, the scope had to be repositioned multiple times in       order to advance the wire and balloon catheter beyond the CHD into the       right and left main hepatic ducts.      A 10 mm biliary sphincterotomy was made with a monofilament       sphincterotome using ERBE electrocautery. There was no       post-sphincterotomy bleeding.      The biliary tree was swept with a 9/12 mm balloon starting at the       bifurcation, left main hepatic duct and right main hepatic duct.      Large amount of thick gravel like sludge was swept from the duct.       Multiple small stones were removed. Impression:               - The entire main bile duct was moderately dilated.                           - Choledocholithiasis was found. Complete removal                            was accomplished by biliary sphincterotomy and                            balloon extraction.                           - A biliary sphincterotomy was performed.                           - The biliary tree was swept. Moderate Sedation:      Patient did not receive moderate sedation for this procedure, but       instead received monitored anesthesia care. Recommendation:           - Advance diet as tolerated.                           - Cholecystectomy timing as  per surgery. Procedure Code(s):        --- Professional ---                           [id number], Endoscopic retrograde                            cholangiopancreatography (ERCP); with removal of                            calculi/debris from biliary/pancreatic duct(s)                           43262, Endoscopic retrograde                            cholangiopancreatography (ERCP); with                            sphincterotomy/papillotomy                           [phone], Endoscopic catheterization of the biliary                            ductal system, radiological supervision and                             interpretation Diagnosis Code(s):        --- Professional ---                           K80.50, Calculus of bile duct without cholangitis                            or cholecystitis without obstruction                           K83.8, Other specified diseases of biliary tract                           R93.2, Abnormal findings on diagnostic imaging of                            liver and biliary tract CPT copyright 2022 [employer]. All rights reserved. The codes documented in this report are preliminary and upon coder review may  be revised to meet current compliance requirements. Ronnette Juniper, MD 07/07/2022 9:26:04 AM This report has been signed electronically. Number of Addenda: 0

## 2022-07-07 NOTE — Progress Notes (Signed)
Pt lives alone and there was concern she had no one to stay with her after discharge since pt will be receiving general anesthesia. Spoke to pt nephew, Maylon Cos - confirmed he will be pt ride home and will be able to stay with her for remainder of the day.

## 2022-07-07 NOTE — Transfer of Care (Signed)
Immediate Anesthesia Transfer of Care Note  Patient: CLARESE AKIN  Procedure(s) Performed: ENDOSCOPIC RETROGRADE CHOLANGIOPANCREATOGRAPHY (ERCP) WITH PROPOFOL SPHINCTEROTOMY REMOVAL OF STONES  Patient Location: PACU  Anesthesia Type:General  Level of Consciousness: awake, alert , oriented, patient cooperative, and responds to stimulation  Airway & Oxygen Therapy: Patient Spontanous Breathing  Post-op Assessment: Report given to RN, Post -op Vital signs reviewed and stable, and Patient moving all extremities X 4  Post vital signs: Reviewed and stable  Last Vitals:  Vitals Value Taken Time  BP 116/56 07/07/22 0923  Temp 37.4 C 07/07/22 0923  Pulse 78 07/07/22 0933  Resp 19 07/07/22 0933  SpO2 93 % 07/07/22 0933  Vitals shown include unvalidated device data.  Last Pain:  Vitals:   07/07/22 0923  TempSrc:   PainSc: 0-No pain         Complications: No notable events documented.

## 2022-07-08 ENCOUNTER — Encounter (HOSPITAL_COMMUNITY): Payer: Self-pay | Admitting: Gastroenterology

## 2022-07-08 NOTE — Progress Notes (Signed)
Patient called by this RN for post-procedure call. She states she's had several bouts of severe pain following her ERCP. Patient called on call provider last night and Dr's office this morning and was encouraged to go to the hospital to be checked out. Had a long discussion with this patient and also encouraged her to go to hospital if she is still having pain. She agreed that was the best option and will likely go to ED. She is not having any acute distress, no N/V or fever.

## 2022-07-11 ENCOUNTER — Other Ambulatory Visit: Payer: Self-pay

## 2022-07-11 DIAGNOSIS — N951 Menopausal and female climacteric states: Secondary | ICD-10-CM

## 2022-07-11 MED ORDER — ESTRADIOL 2 MG PO TABS: 2.0000 mg | ORAL_TABLET | Freq: Every day | ORAL | 2 refills | Status: DC

## 2022-07-12 NOTE — Progress Notes (Signed)
Patient phoned to give updated information on surgery.  Date of Surgery:  07-19-22  Arrival Time - 9:15 and check in at admitting.    NPO Status - patient reminded to not eat solid food after midnight the night before surgery, and from midnight until 8:30 may have clear liquids.    Medications morning of surgery - Azathioprine, Clonazepam, Lamotrigine, Sertraline, Oxycodone if needed.  Okay to use eyedrops and inhalers.    No change in medical history, allergies per patient.  Transportation home - patient will be an overnight stay and niece will pick her up.    All questions answered and patient stated understanding

## 2022-07-19 ENCOUNTER — Other Ambulatory Visit: Payer: Self-pay

## 2022-07-19 ENCOUNTER — Ambulatory Visit: Payer: Self-pay | Admitting: General Surgery

## 2022-07-19 ENCOUNTER — Ambulatory Visit (HOSPITAL_COMMUNITY)
Admission: RE | Admit: 2022-07-19 | Discharge: 2022-07-19 | Disposition: A | Discharge status: Home / Self Care | Payer: Medicare Other | Attending: General Surgery | Admitting: General Surgery

## 2022-07-19 ENCOUNTER — Encounter (HOSPITAL_COMMUNITY): Payer: Self-pay | Admitting: General Surgery

## 2022-07-19 ENCOUNTER — Ambulatory Visit (HOSPITAL_COMMUNITY): Payer: Medicare Other | Admitting: Anesthesiology

## 2022-07-19 ENCOUNTER — Ambulatory Visit (HOSPITAL_BASED_OUTPATIENT_CLINIC_OR_DEPARTMENT_OTHER): Payer: Medicare Other | Admitting: Anesthesiology

## 2022-07-19 ENCOUNTER — Encounter (HOSPITAL_COMMUNITY)
Admission: RE | Disposition: A | Discharge status: Home / Self Care | Payer: Self-pay | Source: Home / Self Care | Attending: General Surgery

## 2022-07-19 DIAGNOSIS — J449 Chronic obstructive pulmonary disease, unspecified: Secondary | ICD-10-CM

## 2022-07-19 DIAGNOSIS — Z9049 Acquired absence of other specified parts of digestive tract: Secondary | ICD-10-CM

## 2022-07-19 DIAGNOSIS — F17210 Nicotine dependence, cigarettes, uncomplicated: Secondary | ICD-10-CM | POA: Insufficient documentation

## 2022-07-19 DIAGNOSIS — F418 Other specified anxiety disorders: Secondary | ICD-10-CM

## 2022-07-19 DIAGNOSIS — G4730 Sleep apnea, unspecified: Secondary | ICD-10-CM | POA: Insufficient documentation

## 2022-07-19 DIAGNOSIS — K8010 Calculus of gallbladder with chronic cholecystitis without obstruction: Secondary | ICD-10-CM | POA: Insufficient documentation

## 2022-07-19 DIAGNOSIS — K8020 Calculus of gallbladder without cholecystitis without obstruction: Secondary | ICD-10-CM | POA: Diagnosis not present

## 2022-07-19 DIAGNOSIS — Z8669 Personal history of other diseases of the nervous system and sense organs: Secondary | ICD-10-CM | POA: Diagnosis not present

## 2022-07-19 DIAGNOSIS — F1910 Other psychoactive substance abuse, uncomplicated: Secondary | ICD-10-CM

## 2022-07-19 DIAGNOSIS — K8060 Calculus of gallbladder and bile duct with cholecystitis, unspecified, without obstruction: Secondary | ICD-10-CM | POA: Diagnosis present

## 2022-07-19 DIAGNOSIS — Z01818 Encounter for other preprocedural examination: Secondary | ICD-10-CM

## 2022-07-19 DIAGNOSIS — F17200 Nicotine dependence, unspecified, uncomplicated: Secondary | ICD-10-CM | POA: Diagnosis not present

## 2022-07-19 HISTORY — PX: CHOLECYSTECTOMY: SHX55

## 2022-07-19 LAB — COMPREHENSIVE METABOLIC PANEL WITH GFR
ALT: 15 U/L (ref 0–44)
AST: 24 U/L (ref 15–41)
Albumin: 3.9 g/dL (ref 3.5–5.0)
Alkaline Phosphatase: 43 U/L (ref 38–126)
Anion gap: 7 (ref 5–15)
BUN: 14 mg/dL (ref 8–23)
CO2: 24 mmol/L (ref 22–32)
Calcium: 8.2 mg/dL — ABNORMAL LOW (ref 8.9–10.3)
Chloride: 107 mmol/L (ref 98–111)
Creatinine, Ser: 1.01 mg/dL — ABNORMAL HIGH (ref 0.44–1.00)
GFR, Estimated: >60 mL/min
Glucose, Bld: 106 mg/dL — ABNORMAL HIGH (ref 70–99)
Potassium: 4.1 mmol/L (ref 3.5–5.1)
Sodium: 138 mmol/L (ref 135–145)
Total Bilirubin: 0.5 mg/dL (ref 0.3–1.2)
Total Protein: 6.3 g/dL — ABNORMAL LOW (ref 6.5–8.1)

## 2022-07-19 LAB — CBC
HCT: 38.3 % (ref 36.0–46.0)
Hemoglobin: 13.4 g/dL (ref 12.0–15.0)
MCH: 38.8 pg — ABNORMAL HIGH (ref 26.0–34.0)
MCHC: 35 g/dL (ref 30.0–36.0)
MCV: 111 fL — ABNORMAL HIGH (ref 80.0–100.0)
Platelets: 161 10*3/uL (ref 150–400)
RBC: 3.45 MIL/uL — ABNORMAL LOW (ref 3.87–5.11)
RDW: 15.8 % — ABNORMAL HIGH (ref 11.5–15.5)
WBC: 3.9 10*3/uL — ABNORMAL LOW (ref 4.0–10.5)
nRBC: 0 % (ref 0.0–0.2)

## 2022-07-19 SURGERY — LAPAROSCOPIC CHOLECYSTECTOMY WITH INTRAOPERATIVE CHOLANGIOGRAM
Anesthesia: General

## 2022-07-19 MED ORDER — AZATHIOPRINE 50 MG PO TABS: 150.0000 mg | ORAL_TABLET | Freq: Every day | ORAL | Status: DC

## 2022-07-19 MED ORDER — ENOXAPARIN SODIUM 30 MG/0.3ML IJ SOSY: 30.0000 mg | PREFILLED_SYRINGE | INTRAMUSCULAR | Status: DC

## 2022-07-19 MED ORDER — ROCURONIUM BROMIDE 10 MG/ML (PF) SYRINGE
PREFILLED_SYRINGE | INTRAVENOUS | Status: DC | PRN
  Administered 2022-07-19: 60 mg via INTRAVENOUS
  Administered 2022-07-19: 20 mg via INTRAVENOUS

## 2022-07-19 MED ORDER — MORPHINE SULFATE (PF) 2 MG/ML IV SOLN
1.0000 mg | INTRAVENOUS | Status: DC | PRN
  Administered 2022-07-19: 2 mg via INTRAVENOUS
  Filled 2022-07-19: qty 1

## 2022-07-19 MED ORDER — DEXAMETHASONE SODIUM PHOSPHATE 10 MG/ML IJ SOLN
INTRAMUSCULAR | Status: DC | PRN
  Administered 2022-07-19: 5 mg via INTRAVENOUS

## 2022-07-19 MED ORDER — HYDROMORPHONE HCL 1 MG/ML IJ SOLN
0.2500 mg | INTRAMUSCULAR | Status: DC | PRN
  Administered 2022-07-19 (×4): 0.5 mg via INTRAVENOUS

## 2022-07-19 MED ORDER — ONDANSETRON HCL 4 MG/2ML IJ SOLN: 4.0000 mg | Freq: Four times a day (QID) | INTRAMUSCULAR | Status: DC | PRN

## 2022-07-19 MED ORDER — CLONAZEPAM 1 MG PO TABS: 1.0000 mg | ORAL_TABLET | Freq: Two times a day (BID) | ORAL | Status: DC

## 2022-07-19 MED ORDER — SERTRALINE HCL 100 MG PO TABS: 100.0000 mg | ORAL_TABLET | Freq: Every day | ORAL | Status: DC

## 2022-07-19 MED ORDER — HYDROMORPHONE HCL 1 MG/ML IJ SOLN
INTRAMUSCULAR | Status: AC
  Filled 2022-07-19: qty 2

## 2022-07-19 MED ORDER — PROMETHAZINE HCL 25 MG/ML IJ SOLN: 6.2500 mg | INTRAMUSCULAR | Status: DC | PRN

## 2022-07-19 MED ORDER — LACTATED RINGERS IR SOLN
Status: DC | PRN
  Administered 2022-07-19: 1000 mL

## 2022-07-19 MED ORDER — BUPIVACAINE LIPOSOME 1.3 % IJ SUSP
INTRAMUSCULAR | Status: DC | PRN
  Administered 2022-07-19: 50 mL

## 2022-07-19 MED ORDER — OXYCODONE HCL 5 MG PO TABS: 5.0000 mg | ORAL_TABLET | Freq: Once | ORAL | Status: DC | PRN

## 2022-07-19 MED ORDER — BUPIVACAINE LIPOSOME 1.3 % IJ SUSP
INTRAMUSCULAR | Status: AC
  Filled 2022-07-19: qty 20

## 2022-07-19 MED ORDER — LIDOCAINE 2% (20 MG/ML) 5 ML SYRINGE
INTRAMUSCULAR | Status: DC | PRN
  Administered 2022-07-19: 40 mg via INTRAVENOUS

## 2022-07-19 MED ORDER — CHLORHEXIDINE GLUCONATE CLOTH 2 % EX PADS: 6.0000 | MEDICATED_PAD | Freq: Once | CUTANEOUS | Status: DC

## 2022-07-19 MED ORDER — ACETAMINOPHEN 500 MG PO TABS: 1000.0000 mg | ORAL_TABLET | Freq: Three times a day (TID) | ORAL | 0 refills | Status: AC

## 2022-07-19 MED ORDER — PROPOFOL 10 MG/ML IV BOLUS
INTRAVENOUS | Status: DC | PRN
  Administered 2022-07-19: 200 mg via INTRAVENOUS

## 2022-07-19 MED ORDER — FENTANYL CITRATE (PF) 100 MCG/2ML IJ SOLN
INTRAMUSCULAR | Status: AC
  Filled 2022-07-19: qty 2

## 2022-07-19 MED ORDER — INDOCYANINE GREEN 25 MG IV SOLR
1.2500 mg | Freq: Once | INTRAVENOUS | Status: AC
  Administered 2022-07-19: 1.25 mg via INTRAVENOUS

## 2022-07-19 MED ORDER — LIDOCAINE HCL (PF) 2 % IJ SOLN
INTRAMUSCULAR | Status: AC
  Filled 2022-07-19: qty 5

## 2022-07-19 MED ORDER — KETOROLAC TROMETHAMINE 30 MG/ML IJ SOLN
INTRAMUSCULAR | Status: AC
  Filled 2022-07-19: qty 1

## 2022-07-19 MED ORDER — ONDANSETRON HCL 4 MG/2ML IJ SOLN
INTRAMUSCULAR | Status: DC | PRN
  Administered 2022-07-19: 4 mg via INTRAVENOUS

## 2022-07-19 MED ORDER — PROPOFOL 10 MG/ML IV BOLUS
INTRAVENOUS | Status: AC
  Filled 2022-07-19: qty 20

## 2022-07-19 MED ORDER — KETOROLAC TROMETHAMINE 15 MG/ML IJ SOLN: 15.0000 mg | Freq: Four times a day (QID) | INTRAMUSCULAR | Status: DC | PRN

## 2022-07-19 MED ORDER — MEPERIDINE HCL 50 MG/ML IJ SOLN: 6.2500 mg | INTRAMUSCULAR | Status: DC | PRN

## 2022-07-19 MED ORDER — ALBUTEROL SULFATE (2.5 MG/3ML) 0.083% IN NEBU: 2.5000 mg | INHALATION_SOLUTION | Freq: Four times a day (QID) | RESPIRATORY_TRACT | Status: DC | PRN

## 2022-07-19 MED ORDER — FENTANYL CITRATE (PF) 250 MCG/5ML IJ SOLN
INTRAMUSCULAR | Status: AC
  Filled 2022-07-19: qty 5

## 2022-07-19 MED ORDER — MIDAZOLAM HCL 2 MG/2ML IJ SOLN
INTRAMUSCULAR | Status: AC
  Administered 2022-07-19: 1 mg via INTRAVENOUS
  Filled 2022-07-19: qty 2

## 2022-07-19 MED ORDER — MOMETASONE FURO-FORMOTEROL FUM 100-5 MCG/ACT IN AERO
2.0000 | INHALATION_SPRAY | Freq: Two times a day (BID) | RESPIRATORY_TRACT | Status: DC
  Filled 2022-07-19: qty 8.8

## 2022-07-19 MED ORDER — ACETAMINOPHEN 500 MG PO TABS
1000.0000 mg | ORAL_TABLET | Freq: Four times a day (QID) | ORAL | Status: DC
  Administered 2022-07-19: 1000 mg via ORAL
  Filled 2022-07-19: qty 2

## 2022-07-19 MED ORDER — HYDROMORPHONE HCL 1 MG/ML IJ SOLN
INTRAMUSCULAR | Status: AC
  Filled 2022-07-19: qty 1

## 2022-07-19 MED ORDER — FENTANYL CITRATE (PF) 250 MCG/5ML IJ SOLN
INTRAMUSCULAR | Status: DC | PRN
  Administered 2022-07-19: 50 ug via INTRAVENOUS
  Administered 2022-07-19: 100 ug via INTRAVENOUS
  Administered 2022-07-19: 50 ug via INTRAVENOUS
  Administered 2022-07-19: 100 ug via INTRAVENOUS
  Administered 2022-07-19: 50 ug via INTRAVENOUS

## 2022-07-19 MED ORDER — SODIUM CHLORIDE 0.9 % IV SOLN
2.0000 g | INTRAVENOUS | Status: AC
  Administered 2022-07-19: 2 g via INTRAVENOUS
  Filled 2022-07-19: qty 2

## 2022-07-19 MED ORDER — UMECLIDINIUM BROMIDE 62.5 MCG/ACT IN AEPB
1.0000 | INHALATION_SPRAY | Freq: Every day | RESPIRATORY_TRACT | Status: DC
  Filled 2022-07-19: qty 7

## 2022-07-19 MED ORDER — KCL IN DEXTROSE-NACL 20-5-0.45 MEQ/L-%-% IV SOLN
INTRAVENOUS | Status: DC
  Filled 2022-07-19: qty 1000

## 2022-07-19 MED ORDER — MIDAZOLAM HCL 2 MG/2ML IJ SOLN
INTRAMUSCULAR | Status: AC
  Filled 2022-07-19: qty 2

## 2022-07-19 MED ORDER — BUDESON-GLYCOPYRROL-FORMOTEROL 160-9-4.8 MCG/ACT IN AERO: 2.0000 | INHALATION_SPRAY | Freq: Two times a day (BID) | RESPIRATORY_TRACT | Status: DC

## 2022-07-19 MED ORDER — ONDANSETRON 4 MG PO TBDP: 4.0000 mg | ORAL_TABLET | Freq: Four times a day (QID) | ORAL | Status: DC | PRN

## 2022-07-19 MED ORDER — OXYCODONE HCL 5 MG PO TABS
15.0000 mg | ORAL_TABLET | Freq: Every day | ORAL | Status: DC
  Administered 2022-07-19: 15 mg via ORAL
  Filled 2022-07-19: qty 3

## 2022-07-19 MED ORDER — PANTOPRAZOLE SODIUM 40 MG IV SOLR: 40.0000 mg | Freq: Every day | INTRAVENOUS | Status: DC

## 2022-07-19 MED ORDER — CHLORHEXIDINE GLUCONATE 0.12 % MT SOLN
15.0000 mL | Freq: Once | OROMUCOSAL | Status: AC
  Administered 2022-07-19: 15 mL via OROMUCOSAL

## 2022-07-19 MED ORDER — SPY AGENT GREEN - (INDOCYANINE FOR INJECTION): 1.2500 mg | Freq: Once | INTRAMUSCULAR | Status: DC

## 2022-07-19 MED ORDER — TRAZODONE HCL 100 MG PO TABS: 100.0000 mg | ORAL_TABLET | Freq: Every evening | ORAL | Status: DC | PRN

## 2022-07-19 MED ORDER — OXYCODONE HCL 5 MG/5ML PO SOLN: 5.0000 mg | Freq: Once | ORAL | Status: DC | PRN

## 2022-07-19 MED ORDER — SIMETHICONE 80 MG PO CHEW: 40.0000 mg | CHEWABLE_TABLET | Freq: Four times a day (QID) | ORAL | Status: DC | PRN

## 2022-07-19 MED ORDER — KETOROLAC TROMETHAMINE 30 MG/ML IJ SOLN
INTRAMUSCULAR | Status: DC | PRN
  Administered 2022-07-19: 30 mg via INTRAVENOUS

## 2022-07-19 MED ORDER — MIDAZOLAM HCL 2 MG/2ML IJ SOLN
INTRAMUSCULAR | Status: DC | PRN
  Administered 2022-07-19: 2 mg via INTRAVENOUS

## 2022-07-19 MED ORDER — SUGAMMADEX SODIUM 200 MG/2ML IV SOLN
INTRAVENOUS | Status: DC | PRN
  Administered 2022-07-19: 150 mg via INTRAVENOUS

## 2022-07-19 MED ORDER — BUPIVACAINE HCL 0.25 % IJ SOLN
INTRAMUSCULAR | Status: AC
  Filled 2022-07-19: qty 1

## 2022-07-19 MED ORDER — MIDAZOLAM HCL 2 MG/2ML IJ SOLN: 1.0000 mg | Freq: Once | INTRAMUSCULAR | Status: DC

## 2022-07-19 MED ORDER — MIDAZOLAM HCL 2 MG/2ML IJ SOLN: 0.5000 mg | Freq: Once | INTRAMUSCULAR | Status: AC | PRN

## 2022-07-19 MED ORDER — DIPHENHYDRAMINE HCL 50 MG/ML IJ SOLN: 12.5000 mg | Freq: Four times a day (QID) | INTRAMUSCULAR | Status: DC | PRN

## 2022-07-19 MED ORDER — LACTATED RINGERS IV SOLN: INTRAVENOUS | Status: DC

## 2022-07-19 MED ORDER — ALBUTEROL SULFATE HFA 108 (90 BASE) MCG/ACT IN AERS: 2.0000 | INHALATION_SPRAY | Freq: Four times a day (QID) | RESPIRATORY_TRACT | Status: DC | PRN

## 2022-07-19 MED ORDER — ONDANSETRON HCL 4 MG/2ML IJ SOLN
INTRAMUSCULAR | Status: AC
  Filled 2022-07-19: qty 2

## 2022-07-19 MED ORDER — DEXAMETHASONE SODIUM PHOSPHATE 10 MG/ML IJ SOLN
INTRAMUSCULAR | Status: AC
  Filled 2022-07-19: qty 1

## 2022-07-19 MED ORDER — DIPHENHYDRAMINE HCL 12.5 MG/5ML PO ELIX: 12.5000 mg | ORAL_SOLUTION | Freq: Four times a day (QID) | ORAL | Status: DC | PRN

## 2022-07-19 MED ORDER — ACETAMINOPHEN 500 MG PO TABS
1000.0000 mg | ORAL_TABLET | ORAL | Status: AC
  Administered 2022-07-19: 1000 mg via ORAL
  Filled 2022-07-19: qty 2

## 2022-07-19 MED ORDER — LAMOTRIGINE 100 MG PO TABS: 200.0000 mg | ORAL_TABLET | Freq: Every evening | ORAL | Status: DC | PRN

## 2022-07-19 MED ORDER — ROCURONIUM BROMIDE 10 MG/ML (PF) SYRINGE
PREFILLED_SYRINGE | INTRAVENOUS | Status: AC
  Filled 2022-07-19: qty 10

## 2022-07-19 MED ORDER — LAMOTRIGINE 100 MG PO TABS: 100.0000 mg | ORAL_TABLET | Freq: Every morning | ORAL | Status: DC

## 2022-07-19 MED ORDER — BISACODYL 5 MG PO TBEC: 5.0000 mg | DELAYED_RELEASE_TABLET | Freq: Every day | ORAL | Status: DC | PRN

## 2022-07-19 SURGICAL SUPPLY — 55 items
ADH SKN CLS APL DERMABOND .7 (GAUZE/BANDAGES/DRESSINGS) ×1
APL PRP STRL LF DISP 70% ISPRP (MISCELLANEOUS) ×1
APL SRG 38 LTWT LNG FL B (MISCELLANEOUS)
APPLICATOR ARISTA FLEXITIP XL (MISCELLANEOUS) IMPLANT
APPLIER CLIP 5 13 M/L LIGAMAX5 (MISCELLANEOUS) ×1
APPLIER CLIP ROT 10 11.4 M/L (STAPLE)
APR CLP MED LRG 11.4X10 (STAPLE)
APR CLP MED LRG 5 ANG JAW (MISCELLANEOUS) ×1
BAG COUNTER SPONGE SURGICOUNT (BAG) IMPLANT
BAG SPEC RTRVL 10 TROC 200 (ENDOMECHANICALS) ×1
BAG SPNG CNTER NS LX DISP (BAG)
CABLE HIGH FREQUENCY MONO STRZ (ELECTRODE) ×2 IMPLANT
CHLORAPREP W/TINT 26 (MISCELLANEOUS) ×2 IMPLANT
CLIP APPLIE 5 13 M/L LIGAMAX5 (MISCELLANEOUS) IMPLANT
CLIP APPLIE ROT 10 11.4 M/L (STAPLE) IMPLANT
CLIP LIGATING HEMO O LOK GREEN (MISCELLANEOUS) IMPLANT
COVER MAYO STAND XLG (MISCELLANEOUS) IMPLANT
COVER SURGICAL LIGHT HANDLE (MISCELLANEOUS) ×2 IMPLANT
DERMABOND ADVANCED .7 DNX12 (GAUZE/BANDAGES/DRESSINGS) ×1 IMPLANT
DRAPE C-ARM 42X120 X-RAY (DRAPES) IMPLANT
DRSG TEGADERM 2-3/8X2-3/4 SM (GAUZE/BANDAGES/DRESSINGS) ×6 IMPLANT
DRSG TEGADERM 4X4.75 (GAUZE/BANDAGES/DRESSINGS) ×2 IMPLANT
ELECT REM PT RETURN 15FT ADLT (MISCELLANEOUS) ×2 IMPLANT
GAUZE SPONGE 2X2 8PLY STRL LF (GAUZE/BANDAGES/DRESSINGS) ×2 IMPLANT
GLOVE BIO SURGEON STRL SZ7.5 (GLOVE) ×2 IMPLANT
GLOVE INDICATOR 8.0 STRL GRN (GLOVE) ×2 IMPLANT
GOWN STRL REUS W/ TWL XL LVL3 (GOWN DISPOSABLE) ×2 IMPLANT
GOWN STRL REUS W/TWL XL LVL3 (GOWN DISPOSABLE) ×1
GRASPER SUT TROCAR 14GX15 (MISCELLANEOUS) IMPLANT
HEMOSTAT ARISTA ABSORB 3G PWDR (HEMOSTASIS) IMPLANT
HEMOSTAT SNOW SURGICEL 2X4 (HEMOSTASIS) IMPLANT
IRRIG SUCT STRYKERFLOW 2 WTIP (MISCELLANEOUS) ×1
IRRIGATION SUCT STRKRFLW 2 WTP (MISCELLANEOUS) ×1 IMPLANT
KIT BASIN OR (CUSTOM PROCEDURE TRAY) ×2 IMPLANT
KIT TURNOVER KIT A (KITS) IMPLANT
L-HOOK LAP DISP 36CM (ELECTROSURGICAL)
LHOOK LAP DISP 36CM (ELECTROSURGICAL) IMPLANT
POUCH RETRIEVAL ECOSAC 10 (ENDOMECHANICALS) ×2 IMPLANT
POUCH RETRIEVAL ECOSAC 10MM (ENDOMECHANICALS) ×1
SCISSORS LAP 5X35 DISP (ENDOMECHANICALS) ×2 IMPLANT
SET CHOLANGIOGRAPH MIX (MISCELLANEOUS) IMPLANT
SET TUBE SMOKE EVAC HIGH FLOW (TUBING) ×2 IMPLANT
SLEEVE Z-THREAD 5X100MM (TROCAR) ×4 IMPLANT
SPIKE FLUID TRANSFER (MISCELLANEOUS) ×2 IMPLANT
STRIP CLOSURE SKIN 1/2X4 (GAUZE/BANDAGES/DRESSINGS) ×2 IMPLANT
SUT MNCRL AB 4-0 PS2 18 (SUTURE) ×2 IMPLANT
SUT VIC AB 0 UR5 27 (SUTURE) IMPLANT
SUT VICRYL 0 TIES 12 18 (SUTURE) IMPLANT
SUT VICRYL 0 UR6 27IN ABS (SUTURE) IMPLANT
TOWEL OR 17X26 10 PK STRL BLUE (TOWEL DISPOSABLE) ×2 IMPLANT
TOWEL OR NON WOVEN STRL DISP B (DISPOSABLE) ×2 IMPLANT
TRAY LAPAROSCOPIC (CUSTOM PROCEDURE TRAY) ×2 IMPLANT
TROCAR 11X100 Z THREAD (TROCAR) IMPLANT
TROCAR BALLN 12MMX100 BLUNT (TROCAR) ×2 IMPLANT
TROCAR Z-THREAD OPTICAL 5X100M (TROCAR) ×2 IMPLANT

## 2022-07-19 NOTE — Anesthesia Procedure Notes (Signed)
Procedure Name: Intubation Date/Time: 07/19/2022 11:38 AM  Performed by: Lollie Sails, CRNAPre-anesthesia Checklist: Patient identified, Emergency Drugs available, Suction available, Patient being monitored and Timeout performed Patient Re-evaluated:Patient Re-evaluated prior to induction Oxygen Delivery Method: Circle system utilized Preoxygenation: Pre-oxygenation with 100% oxygen Induction Type: IV induction Ventilation: Mask ventilation without difficulty Laryngoscope Size: 3 and Glidescope Grade View: Grade I Tube type: Oral Tube size: 7.0 mm Airway Equipment and Method: Rigid stylet and Video-laryngoscopy Placement Confirmation: ETT inserted through vocal cords under direct vision, positive ETCO2 and breath sounds checked- equal and bilateral Secured at: 22 cm Tube secured with: Tape Dental Injury: Teeth and Oropharynx as per pre-operative assessment  Difficulty Due To: Difficulty was anticipated and Difficult Airway- due to anterior larynx Comments: Elective Glidescope intubation done after reviewing patient's previous anesthesia records.

## 2022-07-19 NOTE — Transfer of Care (Signed)
Immediate Anesthesia Transfer of Care Note  Patient: [name]  Procedure(s) Performed: LAPAROSCOPIC CHOLECYSTECTOMY; BILATERAL TAP BLOCK INDOCYANINE GREEN FLUORESCENCE IMAGING (ICG)  Patient Location: PACU  Anesthesia Type:General  Level of Consciousness: awake, alert , oriented, and patient cooperative  Airway & Oxygen Therapy: Patient Spontanous Breathing and Patient connected to face mask oxygen  Post-op Assessment: Report given to RN and Post -op Vital signs reviewed and stable  Post vital signs: Reviewed and stable  Last Vitals:  Vitals Value Taken Time  BP 136/81 07/19/22 1255  Temp    Pulse 59 07/19/22 1257  Resp 16 07/19/22 1257  SpO2 100 % 07/19/22 1257  Vitals shown include unvalidated device data.  Last Pain:  Vitals:   07/19/22 1019  TempSrc:   PainSc: 3       Patients Stated Pain Goal: 0 ([id number])  Complications:  Encounter Notable Events  Notable Event Outcome Phase Comment  Difficult to intubate - expected  Intraprocedure Filed from anesthesia note documentation.

## 2022-07-19 NOTE — Progress Notes (Signed)
Discharge package printed and instructions given to patient. Verbalizes understanding.  

## 2022-07-19 NOTE — H&P (Signed)
REFERRING PHYSICIAN: Janean Sark,*  PROVIDER: Taveon Enyeart Leanne Chang, MD  MRN: [id number] DOB: [birth year] DATE OF ENCOUNTER: 06/23/2022  Subjective  Chief Complaint: New Patient (Gallstones )   History of Present Illness: [name] is a 72 y.o. female who is seen today as an office consultation at the request of Dr. Thereasa Solo for evaluation of New Patient (Gallstones ) .  Patient was in the emergency room on May 04, 2022 for abdominal pain. She left the emergency room because the pain improved. She had had several days of nausea and chills and abdominal pain. She reportedly has also had a couple of falls with no reported injuries. She is under the care of neurology for history of seizures. She saw her PCP on February 5. She has a remote history of alcohol use.  She reports the pain that prompted her to go to the emergency room in January was quite severe. It was right-sided and radiated to her right back. It was associated with some chills. For a day or 2 after that she had some chills. The pain resolved. She had another episode of right upper quadrant pain this past Monday night it was not as intense. She does have GERD so she took a lot of Tums to see if that would help with the pain and did not. She ended up taking some of her oxycodone which relieved the pain. No fevers or chills. No nausea or vomiting. She does have some baseline constipation due to her chronic oxycodone use for interstitial cystitis. She had follow-up labs which showed normal LFTs.  Her imaging in the emergency room did show cholelithiasis as well as evidence of choledocholithiasis.    Review of Systems: A complete review of systems was obtained from the patient. I have reviewed this information and discussed as appropriate with the patient. See HPI as well for other ROS.  ROS  Medical History: Past Medical History: Diagnosis Date Anxiety COPD (chronic obstructive pulmonary disease) (CMS-HCC) GERD  (gastroesophageal reflux disease) Hyperlipidemia Liver disease  There is no problem list on file for this patient.  History reviewed. No pertinent surgical history.  Allergies Allergen Reactions Haloperidol Anaphylaxis  Current Outpatient Medications on File Prior to Visit Medication Sig Dispense Refill atropine (ISOPTO ATROPINE) 1 % ophthalmic solution Apply to eye cholecalciferol (VITAMIN D3) 2,000 unit tablet Take by mouth estradioL (ESTRACE) 0.5 MG tablet lamoTRIgine (LAMICTAL) 100 MG tablet Take by mouth oxyCODONE (ROXICODONE) 15 MG immediate release tablet Take by mouth every 6 (six) hours as needed sertraline (ZOLOFT) 100 MG tablet Take 100 mg by mouth once daily  No current facility-administered medications on file prior to visit.  History reviewed. No pertinent family history.  Social History  Tobacco Use Smoking Status Every Day Types: Cigarettes Smokeless Tobacco Current   Social History  Socioeconomic History Marital status: Divorced Tobacco Use Smoking status: Every Day Types: Cigarettes Smokeless tobacco: Current Substance and Sexual Activity Alcohol use: Never Drug use: Never  Objective:  Vitals: 06/23/22 1515 BP: 118/60 Pulse: 74 Temp: 36.6 C (97.8 F) SpO2: 96% Weight: 57.2 kg (126 lb 3.2 oz) Height: 165.1 cm (5\' 5" )  Body mass index is 21 kg/m.  Constitutional: NAD; conversant; no deformities Eyes: Moist conjunctiva; no lid lag; anicteric; PERRL Neck: Trachea midline; no thyromegaly Lungs: Normal respiratory effort; no tactile fremitus CV: RRR; no palpable thrills; no pitting edema GI: Abd, nontender, nondistended; no palpable hepatosplenomegaly MSK: No rash, lesions or jaundice; normal gait; no clubbing/cyanosis Psychiatric: Appropriate affect; alert and oriented x3  Lymphatic: No palpable cervical or axillary lymphadenopathy Skin:no rash/lesions  Labs, Imaging and Diagnostic Testing: LPCP office note 05/23/22  ED PA note  05/04/22  Labs February 2024-normal LFTs, CBC shows white blood cell count of 3.3, hemoglobin 13.6, hematocrit 38.2, platelet count 293  Labs May 04, 2022 showed a elevated T bilirubin of 1.5, AST elevated at 110  CT abdomen pelvis May 04, 2022 FINDINGS: Lower chest: Hypoventilatory changes at the lung bases. No acute pleural or parenchymal lung disease.  Hepatobiliary: 1.1 cm hemangioma inferior right lobe liver image 32/2 is unchanged and benign. No other focal liver abnormalities. There is diffuse intrahepatic and extrahepatic biliary duct dilation, with common bile duct measuring up to 13 mm in diameter. Multiple obstructing calculi are seen within the downstream common bile duct compatible with choledocholithiasis, largest measuring 7 mm. Gallbladder is moderately distended, with calcified gallstones but no evidence of acute cholecystitis.  Pancreas: Unremarkable. No pancreatic ductal dilatation or surrounding inflammatory changes.  Spleen: Normal in size without focal abnormality.  Adrenals/Urinary Tract: The kidneys are unremarkable. The adrenals are grossly normal. Evaluation of the bladder is limited due to under distension and artifact from bilateral hip arthroplasties.  Stomach/Bowel: No bowel obstruction or ileus. No bowel wall thickening or inflammatory change.  Vascular/Lymphatic: Aortic atherosclerosis. No pathologic adenopathy within the abdomen or pelvis.  Reproductive: Status post hysterectomy. No adnexal masses.  Other: No free fluid or free intraperitoneal gas. No abdominal wall hernia.  Musculoskeletal: Bilateral hip arthroplasties. No acute or destructive bony lesions. Reconstructed images demonstrate no additional findings.  IMPRESSION: 1. Obstructing downstream choledocholithiasis measuring up to 7 mm, with intra and extrahepatic biliary duct dilation as above. 2. Cholelithiasis without evidence of cholecystitis. 3. Aortic Atherosclerosis  (ICD10-I70.0).  Assessment and Plan:   Diagnoses and all orders for this visit:  Symptomatic cholelithiasis  Choledocholithiasis    I believe the patient's symptoms are consistent with gallbladder disease.  We discussed gallbladder disease. The patient was given Neurosurgeon. We discussed non-operative and operative management. We discussed the signs & symptoms of acute cholecystitis  I discussed laparoscopic cholecystectomy with possible IOC in detail. The patient was given educational material as well as diagrams detailing the procedure. We discussed the risks and benefits of a laparoscopic cholecystectomy including, but not limited to bleeding, infection, injury to surrounding structures such as the intestine or liver, bile leak, retained gallstones, need to convert to an open procedure, prolonged diarrhea, blood clots such as DVT, common bile duct injury, anesthesia risks, and possible need for additional procedures. We discussed the typical post-operative recovery course. I explained that the likelihood of improvement of their symptoms is good.  The patient also had evidence of choledocholithiasis on CT imaging. Her LFTs were rechecked and they were normal. I still think her common bile duct needs to be evaluated. We will order a preoperative MRI MRCP. We did discuss that should her preop MRI MRCP show common bile duct stones and then she will need preoperative ERCP. She has seen Dr. Therisa Doyne in the past. If her preop MRCP is negative we would still plan to do an intraoperative cholangiogram which still may potentially show common bile duct stones which would necessitate ERCP.  This patient encounter took 45 minutes today to perform the following: take history, perform exam, review outside records, interpret imaging, counsel the patient on their diagnosis and document encounter, findings & plan in the EHR  No follow-ups on file.  Altovise Wahler Leanne Chang, MD General, Minimally  Invasive, & Bariatric Surgery  Electronically signed by Rudean Curt, MD at 06/23/2022 4:43 PM EST

## 2022-07-19 NOTE — Anesthesia Preprocedure Evaluation (Addendum)
Anesthesia Evaluation  Patient identified by MRN, date of birth, ID band Patient awake    Reviewed: Allergy & Precautions, NPO status , Patient's Chart, lab work & pertinent test results  History of Anesthesia Complications (+) DIFFICULT AIRWAY and history of anesthetic complications  Airway Mallampati: I  TM Distance: >3 FB Neck ROM: Full    Dental  (+) Poor Dentition, Missing, Chipped, Dental Advisory Given   Pulmonary sleep apnea , COPD,  COPD inhaler, Current SmokerPatient did not abstain from smoking.   breath sounds clear to auscultation       Cardiovascular  Rhythm:Regular Rate:Normal  '21 ECHO: EF 60 to 65%. The LV has normal function, no regional wall motion abnormalities. Grade I DD, normal RVF. no significant valvular abnormalities    Neuro/Psych  Headaches  Anxiety Depression       GI/Hepatic ,GERD  Controlled,,(+)     substance abuse (4 years ago)  alcohol use and cocaine use  Endo/Other  negative endocrine ROS    Renal/GU negative Renal ROS     Musculoskeletal   Abdominal   Peds  Hematology negative hematology ROS (+)   Anesthesia Other Findings   Reproductive/Obstetrics                             Anesthesia Physical Anesthesia Plan  ASA: 3  Anesthesia Plan: General   Post-op Pain Management: Tylenol PO (pre-op)*   Induction: Intravenous  PONV Risk Score and Plan: 2 and Ondansetron and Dexamethasone  Airway Management Planned: Oral ETT  Additional Equipment: None  Intra-op Plan:   Post-operative Plan: Extubation in OR  Informed Consent: I have reviewed the patients History and Physical, chart, labs and discussed the procedure including the risks, benefits and alternatives for the proposed anesthesia with the patient or authorized representative who has indicated his/her understanding and acceptance.     Dental advisory given  Plan Discussed with: CRNA and  Surgeon  Anesthesia Plan Comments:         Anesthesia Quick Evaluation

## 2022-07-19 NOTE — Anesthesia Postprocedure Evaluation (Signed)
Anesthesia Post Note  Patient: DANAI KOSKOVICH  Procedure(s) Performed: LAPAROSCOPIC CHOLECYSTECTOMY; BILATERAL TAP BLOCK INDOCYANINE GREEN FLUORESCENCE IMAGING (ICG)     Patient location during evaluation: PACU Anesthesia Type: General Level of consciousness: awake and alert, patient cooperative and oriented Pain management: pain level controlled (pain markedly improved) Vital Signs Assessment: post-procedure vital signs reviewed and stable Respiratory status: spontaneous breathing, nonlabored ventilation, respiratory function stable and patient connected to nasal cannula oxygen Cardiovascular status: blood pressure returned to baseline and stable Postop Assessment: no apparent nausea or vomiting Anesthetic complications: yes   Encounter Notable Events  Notable Event Outcome Phase Comment  Difficult to intubate - expected  Intraprocedure Filed from anesthesia note documentation.    Last Vitals:  Vitals:   07/19/22 1400 07/19/22 1415  BP: 101/74 (!) 128/95  Pulse: 60 64  Resp: 16 19  Temp: 36.7 C   SpO2: 96% 99%    Last Pain:  Vitals:   07/19/22 1415  TempSrc:   PainSc: 5                  Rieley Khalsa,E. Hever Castilleja

## 2022-07-19 NOTE — Discharge Instructions (Signed)
[facility] LAPAROSCOPIC SURGERY: POST OP INSTRUCTIONS Always review your discharge instruction sheet given to you by the facility where your surgery was performed. IF YOU HAVE DISABILITY OR FAMILY LEAVE FORMS, YOU MUST BRING THEM TO THE OFFICE FOR PROCESSING.   DO NOT GIVE THEM TO YOUR DOCTOR.  PAIN CONTROL  First take acetaminophen (Tylenol) AND/or ibuprofen (Advil) to control your pain after surgery.  Follow directions on package.  Taking acetaminophen (Tylenol) and/or ibuprofen (Advil) regularly after surgery will help to control your pain and lower the amount of prescription pain medication you may need.  You should not take more than 3,000 mg (3 grams) of acetaminophen (Tylenol) in 24 hours.  You should not take ibuprofen (Advil), aleve, motrin, naprosyn or other NSAIDS if you have a history of stomach ulcers or chronic kidney disease.  A prescription for pain medication may be given to you upon discharge.  Take your pain medication as prescribed, if you still have uncontrolled pain after taking acetaminophen (Tylenol) or ibuprofen (Advil). Use ice packs to help control pain. If you need a refill on your pain medication, please contact your pharmacy.  They will contact our office to request authorization. Prescriptions will not be filled after 5pm or on week-ends.  HOME MEDICATIONS Take your usually prescribed medications unless otherwise directed.  DIET You should follow a light diet the first few days after arrival home.  Be sure to include lots of fluids daily. Avoid fatty, fried foods.   CONSTIPATION It is common to experience some constipation after surgery and if you are taking pain medication.  Increasing fluid intake and taking a stool softener (such as Colace) will usually help or prevent this problem from occurring.  A mild laxative (Milk of Magnesia or Miralax) should be taken according to package instructions if there are no bowel movements after 48  hours.  WOUND/INCISION CARE Most patients will experience some swelling and bruising in the area of the incisions.  Ice packs will help.  Swelling and bruising can take several days to resolve.  Unless discharge instructions indicate otherwise, follow guidelines below  STERI-STRIPS - you may remove your outer bandages 48 hours after surgery, and you may shower at that time.  You have steri-strips (small skin tapes) in place directly over the incision.  These strips should be left on the skin for 7-10 days.   DERMABOND/SKIN GLUE - you may shower in 24 hours.  The glue will flake off over the next 2-3 weeks. Any sutures or staples will be removed at the office during your follow-up visit.  ACTIVITIES You may resume regular (light) daily activities beginning the next day--such as daily self-care, walking, climbing stairs--gradually increasing activities as tolerated.  You may have sexual intercourse when it is comfortable.  Refrain from any heavy lifting or straining until approved by your doctor. You may drive when you are no longer taking prescription pain medication, you can comfortably wear a seatbelt, and you can safely maneuver your car and apply brakes.  FOLLOW-UP You should see your doctor in the office for a follow-up appointment approximately 2-3 weeks after your surgery.  You should have been given your post-op/follow-up appointment when your surgery was scheduled.  If you did not receive a post-op/follow-up appointment, make sure that you call for this appointment within a day or two after you arrive home to insure a convenient appointment time.  OTHER INSTRUCTIONS   WHEN TO CALL YOUR DOCTOR: Fever over 101.0 Inability to urinate Continued   bleeding from incision. Increased pain, redness, or drainage from the incision. Increasing abdominal pain  The clinic staff is available to answer your questions during regular business hours.  Please don't hesitate to call and ask to speak to  one of the nurses for clinical concerns.  If you have a medical emergency, go to the nearest emergency room or call 911.  A surgeon from [facility] is always on call at the hospital. [address], [address], [place], [place]  [address] ? [address] [address], [place], [place]   [address] [phone] ? [phone] ? FAX [phone] Web site: [email or web address]  

## 2022-07-19 NOTE — Plan of Care (Signed)
  Problem: Activity: Goal: Risk for activity intolerance will decrease Outcome: Progressing   Problem: Nutrition: Goal: Adequate nutrition will be maintained Outcome: Progressing   Problem: Pain Managment: Goal: General experience of comfort will improve Outcome: Progressing   Problem: Safety: Goal: Ability to remain free from injury will improve Outcome: Progressing   

## 2022-07-19 NOTE — Interval H&P Note (Signed)
History and Physical Interval Note:  07/19/2022 10:52 AM  Claire [name]  has presented today for surgery, with the diagnosis of SYMPTOMATIC CHOLELITHIASIS, POSSIBLE COMMON BILE DUCT STONES.  The various methods of treatment have been discussed with the patient and family. After consideration of risks, benefits and other options for treatment, the patient has consented to  Procedure(s): LAPAROSCOPIC CHOLECYSTECTOMY WITH INTRAOPERATIVE CHOLANGIOGRAM (N/A) INDOCYANINE GREEN FLUORESCENCE IMAGING (ICG) (N/A) as a surgical intervention.  The patient's history has been reviewed, patient examined, no change in status, [name] for surgery.  I have reviewed the patient's chart and labs.  Questions were answered to the patient's satisfaction.     Patient reports she has had persistent right-sided pain since her ERCP.  When she called the office the other week we encouraged her to get evaluated the emergency room but she stated that she did not go.  She denies any fevers or chills nausea or vomiting.  She just has been having persistent right-sided pain.  Patient's vital signs are [name].  Her abdominal exam is [name].  I think we can proceed with laparoscopic cholecystectomy Claire [name]

## 2022-07-19 NOTE — Op Note (Signed)
[name] [id number] 1950/09/24 07/19/2022  Laparoscopic Cholecystectomy with near infrared fluorescent cholangiography procedure Note; laparoscopic bilateral tap block  Indications: This patient presents with symptomatic gallbladder disease and will undergo laparoscopic cholecystectomy.  The patient had preoperative imaging which demonstrated numerous common bile duct stones and common hepatic duct stones.  She underwent ERCP by Dr. Therisa Doyne about a week and a half ago with sphincterotomy and had her bile duct cleared of stones.  She presents today for cholecystectomy.  Pre-operative Diagnosis: Symptomatic cholelithiasis, history of choledocholithiasis status post ERCP with sphincterotomy; chronic pain  Post-operative Diagnosis: same  Surgeon: [name] MD FACS  Assistants: none  Anesthesia: General endotracheal anesthesia    Procedure Details  The patient was seen again in the Holding Room. The risks, benefits, complications, treatment options, and expected outcomes were discussed with the patient. The possibilities of reaction to medication, pulmonary aspiration, perforation of viscus, bleeding, recurrent infection, finding a normal gallbladder, the need for additional procedures, failure to diagnose a condition, the possible need to convert to an open procedure, and creating a complication requiring transfusion or operation were discussed with the patient. The likelihood of improving the patient's symptoms with return to their baseline status is good.  The patient and/or family concurred with the proposed plan, giving informed consent. The site of surgery properly noted. The patient was taken to Operating Room, identified as [name] and the procedure verified as Laparoscopic Cholecystectomy with ICG dye.  A Time Out was held and the above information confirmed. Antibiotic prophylaxis was administered.    ICG dye was administered preoperatively.    General endotracheal anesthesia  was then administered and tolerated well. After the induction, the abdomen was prepped with Chloraprep and draped in the sterile fashion. The patient was positioned in the supine position.  Local anesthetic agent was injected into the skin near the umbilicus and an incision made. We dissected down to the abdominal fascia with blunt dissection.  The fascia was incised vertically and we entered the peritoneal cavity bluntly.  A pursestring suture of 0-Vicryl was placed around the fascial opening.  The Hasson cannula was inserted and secured with the stay suture.  Pneumoperitoneum was then created with CO2 and tolerated well without any adverse changes in the patient's vital signs. An 5-mm port was placed in the subxiphoid position.  Two 5-mm ports were placed in the right upper quadrant. All skin incisions were infiltrated with a local anesthetic agent before making the incision and placing the trocars.   We positioned the patient in reverse Trendelenburg, tilted slightly to the patient's left.  The gallbladder was identified, the fundus grasped and retracted cephalad. Adhesions were lysed bluntly and with the electrocautery where indicated, taking care not to injure any adjacent organs or viscus. The infundibulum was grasped and retracted laterally, exposing the peritoneum overlying the triangle of Calot. This was then divided and exposed in a blunt fashion. A critical view of the cystic duct and cystic artery was obtained.  The cystic duct was clearly identified and bluntly dissected circumferentially.  Utilizing the Stryker camera system near infrared fluorescent activity was visualized in the liver, cystic duct, common hepatic duct and common bile duct and small bowel.  This served as a secondary confirmation of our anatomy.  The cystic duct was then ligated with clips and divided. The cystic artery which had been identified & dissected free was ligated with clips and divided as well.   The gallbladder was  dissected from the liver bed  in retrograde fashion with the electrocautery. The gallbladder was removed and placed in an Ecco sac.  The gallbladder and Ecco sac were then removed through the umbilical port site. The liver bed was irrigated and inspected. Hemostasis was achieved with the electrocautery. Copious irrigation was utilized and was repeatedly aspirated until clear.  The pursestring suture was used to close the umbilical fascia.  I placed an additional interrupted 0 Vicryl at the umbilical fascia using PMI with suture passer with laparoscopic guidance.  A bilateral laparoscopic tap block was performed with a mixture of Exparel with Marcaine due to the patient's chronic pain syndrome.  We again inspected the right upper quadrant for hemostasis.  The umbilical closure was inspected and there was no air leak and nothing trapped within the closure. Pneumoperitoneum was released as we removed the trocars.  4-0 Monocryl was used to close the skin.   Dermabond was applied. The patient was then extubated and brought to the recovery room in stable condition. Instrument, sponge, and needle counts were correct at closure and at the conclusion of the case.   Findings: Cholelithiasis, positive critical view Near infrared fluorescent cholangiography visualized in the cystic duct, common hepatic duct, common bile duct and small bowel  Estimated Blood Loss: Minimal         Drains: none         Specimens: Gallbladder           Complications: None; patient tolerated the procedure well.         Disposition: PACU - hemodynamically stable.         Condition: stable  [name]. Redmond Pulling, MD, FACS General, Bariatric, & Minimally Invasive Surgery [facility],  [place]

## 2022-07-20 ENCOUNTER — Encounter (HOSPITAL_COMMUNITY): Payer: Self-pay | Admitting: General Surgery

## 2022-07-20 LAB — SURGICAL PATHOLOGY

## 2022-07-29 ENCOUNTER — Ambulatory Visit: Payer: Medicare Other

## 2022-07-29 DIAGNOSIS — G890 Central pain syndrome: Secondary | ICD-10-CM | POA: Diagnosis not present

## 2022-07-29 DIAGNOSIS — N3010 Interstitial cystitis (chronic) without hematuria: Secondary | ICD-10-CM | POA: Diagnosis not present

## 2022-07-29 DIAGNOSIS — G894 Chronic pain syndrome: Secondary | ICD-10-CM | POA: Diagnosis not present

## 2022-07-29 DIAGNOSIS — R1084 Generalized abdominal pain: Secondary | ICD-10-CM | POA: Diagnosis not present

## 2022-07-29 DIAGNOSIS — Z79891 Long term (current) use of opiate analgesic: Secondary | ICD-10-CM | POA: Diagnosis not present
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
## 2022-08-01 ENCOUNTER — Ambulatory Visit (INDEPENDENT_AMBULATORY_CARE_PROVIDER_SITE_OTHER): Payer: Medicare Other | Admitting: Internal Medicine

## 2022-08-01 ENCOUNTER — Encounter: Payer: Self-pay | Admitting: Internal Medicine

## 2022-08-01 VITALS — BP 110/60 | HR 62 | Ht 65.5 in | Wt 123.2 lb

## 2022-08-01 DIAGNOSIS — F419 Anxiety disorder, unspecified: Secondary | ICD-10-CM

## 2022-08-01 DIAGNOSIS — J449 Chronic obstructive pulmonary disease, unspecified: Secondary | ICD-10-CM

## 2022-08-01 DIAGNOSIS — F32A Depression, unspecified: Secondary | ICD-10-CM | POA: Diagnosis not present

## 2022-08-01 DIAGNOSIS — F519 Sleep disorder not due to a substance or known physiological condition, unspecified: Secondary | ICD-10-CM | POA: Diagnosis not present

## 2022-08-01 NOTE — Patient Instructions (Signed)
Order-Refer to Kandice Robinsons, RN team for low dose screening CT program  Order- refer to Behavioral Health   dx depression, anxiety  Recommend that you split your remaining clonazepam tabs. Taper by taking 1 daily x 2 days, then 1/2 tab daily.

## 2022-08-03 NOTE — Telephone Encounter (Signed)
noted 

## 2022-08-09 ENCOUNTER — Encounter: Payer: Self-pay | Admitting: Internal Medicine

## 2022-08-09 ENCOUNTER — Ambulatory Visit: Payer: Medicare Other | Admitting: Diagnostic Neuroimaging

## 2022-08-09 NOTE — Assessment & Plan Note (Signed)
Spite her smoking, respiratory symptoms are surprisingly mild.  Emphasis on smoking cessation and other outlets for nervous energy. Plan-continue Breztri, Ventolin

## 2022-08-09 NOTE — Assessment & Plan Note (Signed)
She is certainly anxious and depressed, feeding into her smoking problems.  I have talked again about seeing Behavioral Health and we will try again to refer her. Discussed coming off of clonazepam by tapering.  She does have trazodone as backup.

## 2022-08-15 ENCOUNTER — Other Ambulatory Visit: Payer: Self-pay | Admitting: Physician Assistant

## 2022-08-15 DIAGNOSIS — F4321 Adjustment disorder with depressed mood: Secondary | ICD-10-CM

## 2022-08-15 DIAGNOSIS — F4320 Adjustment disorder, unspecified: Secondary | ICD-10-CM

## 2022-08-15 DIAGNOSIS — F3289 Other specified depressive episodes: Secondary | ICD-10-CM

## 2022-08-16 DIAGNOSIS — N3010 Interstitial cystitis (chronic) without hematuria: Secondary | ICD-10-CM | POA: Diagnosis not present

## 2022-08-16 DIAGNOSIS — Z79891 Long term (current) use of opiate analgesic: Secondary | ICD-10-CM | POA: Diagnosis not present

## 2022-08-16 DIAGNOSIS — G890 Central pain syndrome: Secondary | ICD-10-CM | POA: Diagnosis not present

## 2022-08-16 DIAGNOSIS — R1084 Generalized abdominal pain: Secondary | ICD-10-CM | POA: Diagnosis not present

## 2022-08-16 NOTE — Telephone Encounter (Signed)
Unable to refill per protocol, Rx request is too soon. Last refill 05/23/22 for 30 and 3 refills.  Requested Prescriptions  Pending Prescriptions Disp Refills   sertraline (ZOLOFT) 100 MG tablet [Pharmacy Med Name: SERTRALINE 100MG  TABLETS] 30 tablet 3    Sig: TAKE 1 TABLET(100 MG) BY MOUTH DAILY     Psychiatry:  Antidepressants - SSRI - sertraline Passed - 08/15/2022 11:47 AM      Passed - AST in normal range and within 360 days    AST  Date Value Ref Range Status  07/19/2022 24 15 - 41 U/L Final         Passed - ALT in normal range and within 360 days    ALT  Date Value Ref Range Status  07/19/2022 15 0 - 44 U/L Final         Passed - Completed PHQ-2 or PHQ-9 in the last 360 days      Passed - Valid encounter within last 6 months    Recent Outpatient Visits           2 months ago Gallstones   [place] [place], [place], [place]   3 months ago Grief reaction   [facility] [place], [place], [place]   1 year ago Muscle spasm of back   [facility] Marcine Matar, MD   1 year ago Restless legs syndrome   [place] [facility] [place], [place], [place]   2 years ago Sialorrhea   [place] [facility] Hoy Register, MD       Future Appointments             In 6 days Marcine Matar, MD [facility]

## 2022-08-17 ENCOUNTER — Other Ambulatory Visit: Payer: Self-pay | Admitting: Diagnostic Neuroimaging

## 2022-08-22 ENCOUNTER — Ambulatory Visit: Payer: Medicare Other | Attending: Nurse Practitioner | Admitting: Internal Medicine

## 2022-08-22 ENCOUNTER — Encounter: Payer: Self-pay | Admitting: Internal Medicine

## 2022-08-22 VITALS — BP 111/67 | HR 66 | Temp 98.5°F | Ht 65.0 in | Wt 119.0 lb

## 2022-08-22 DIAGNOSIS — Z23 Encounter for immunization: Secondary | ICD-10-CM | POA: Insufficient documentation

## 2022-08-22 DIAGNOSIS — Z79899 Other long term (current) drug therapy: Secondary | ICD-10-CM | POA: Insufficient documentation

## 2022-08-22 DIAGNOSIS — R634 Abnormal weight loss: Secondary | ICD-10-CM | POA: Diagnosis present

## 2022-08-22 DIAGNOSIS — G2581 Restless legs syndrome: Secondary | ICD-10-CM | POA: Insufficient documentation

## 2022-08-22 DIAGNOSIS — F17210 Nicotine dependence, cigarettes, uncomplicated: Secondary | ICD-10-CM | POA: Diagnosis not present

## 2022-08-22 DIAGNOSIS — Z780 Asymptomatic menopausal state: Secondary | ICD-10-CM | POA: Diagnosis not present

## 2022-08-22 DIAGNOSIS — E782 Mixed hyperlipidemia: Secondary | ICD-10-CM | POA: Diagnosis not present

## 2022-08-22 DIAGNOSIS — M8580 Other specified disorders of bone density and structure, unspecified site: Secondary | ICD-10-CM | POA: Diagnosis not present

## 2022-08-22 DIAGNOSIS — R232 Flushing: Secondary | ICD-10-CM | POA: Diagnosis not present

## 2022-08-22 DIAGNOSIS — F331 Major depressive disorder, recurrent, moderate: Secondary | ICD-10-CM | POA: Diagnosis not present

## 2022-08-22 DIAGNOSIS — N3010 Interstitial cystitis (chronic) without hematuria: Secondary | ICD-10-CM | POA: Diagnosis not present

## 2022-08-22 DIAGNOSIS — Z6379 Other stressful life events affecting family and household: Secondary | ICD-10-CM | POA: Diagnosis not present

## 2022-08-22 DIAGNOSIS — Z79624 Long term (current) use of inhibitors of nucleotide synthesis: Secondary | ICD-10-CM | POA: Diagnosis not present

## 2022-08-22 DIAGNOSIS — G4700 Insomnia, unspecified: Secondary | ICD-10-CM | POA: Diagnosis not present

## 2022-08-22 DIAGNOSIS — J449 Chronic obstructive pulmonary disease, unspecified: Secondary | ICD-10-CM | POA: Diagnosis not present

## 2022-08-22 DIAGNOSIS — M160 Bilateral primary osteoarthritis of hip: Secondary | ICD-10-CM | POA: Diagnosis not present

## 2022-08-22 MED ORDER — ZOSTER VAC RECOMB ADJUVANTED 50 MCG/0.5ML IM SUSR: 0.5000 mL | Freq: Once | INTRAMUSCULAR | 0 refills | Status: AC
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
## 2022-08-22 NOTE — Patient Instructions (Signed)
Try to eat smaller but more frequent meals.   Referral submitted to Pain Management and to [facility].

## 2022-08-23 LAB — TSH+T4F+T3FREE
Free T4: 1.18 ng/dL (ref 0.82–1.77)
T3, Free: 2.8 pg/mL (ref 2.0–4.4)
TSH: 2.65 u[IU]/mL (ref 0.450–4.500)

## 2022-08-23 LAB — LIPID PANEL
Chol/HDL Ratio: 2.6 ratio (ref 0.0–4.4)
Cholesterol, Total: 138 mg/dL (ref 100–199)
HDL: 54 mg/dL
LDL Chol Calc (NIH): 67 mg/dL (ref 0–99)
Triglycerides: 91 mg/dL (ref 0–149)
VLDL Cholesterol Cal: 17 mg/dL (ref 5–40)

## 2022-08-24 ENCOUNTER — Telehealth: Payer: Self-pay | Admitting: Internal Medicine

## 2022-08-24 NOTE — Telephone Encounter (Signed)
-----   Message from Dionne Bucy sent at 08/23/2022 11:11 AM EDT ----- Regarding: Psychiatry Referral Good Morning  Sent referral  to   [facility] [address] [place], [place] [address] Ph# [phone]    ----- Message ----- From: Marcine Matar, MD Sent: 08/22/2022   6:27 PM EDT To: Dionne Bucy  Needs referral to Psychiatry.

## 2022-08-26 ENCOUNTER — Ambulatory Visit
Admission: RE | Admit: 2022-08-26 | Discharge: 2022-08-26 | Disposition: A | Discharge status: Home / Self Care | Payer: Medicare Other | Source: Ambulatory Visit | Attending: Internal Medicine | Admitting: Internal Medicine

## 2022-08-26 DIAGNOSIS — Z1231 Encounter for screening mammogram for malignant neoplasm of breast: Secondary | ICD-10-CM

## 2022-08-30 ENCOUNTER — Telehealth: Payer: Self-pay | Admitting: Internal Medicine

## 2022-08-30 NOTE — Telephone Encounter (Signed)
Copied from CRM [id number]. Topic: Referral - Status >> Aug 30, 2022  9:47 AM Franchot Heidelberg wrote: Reason for CRM: Pt called to report that [facility] does not treat her diagnosis, the patient is almost out of her current medication so she needs an alternative option soon.   Wants to know if her referral could be sent to [facility] pain clinic?   Also, pt is following through with her referral to [place] [facility]. Wants PCP to know

## 2022-08-31 ENCOUNTER — Telehealth: Payer: Self-pay | Admitting: Internal Medicine

## 2022-08-31 NOTE — Telephone Encounter (Signed)
Called & spoke to the patient. Informed patient that our referral coordinator will be trying to get her in with [facility] or [facility] [facility]. Patient stated that she does not want to be sent to [facility] on [address] but will be ok at other locations. Instructed patient that she may have to return to Dr.Dawka until she is able to get in to another location. Patient expressed verbal understanding. No further questions at this time.

## 2022-08-31 NOTE — Telephone Encounter (Signed)
-----   Message from Dionne Bucy sent at 08/30/2022  2:01 PM EDT ----- Regarding: [facility]  APPT Good Afternoon Patient schedule  09/19/2022 1:00 PM Arfeen, Phillips Grout, MD [facility] [place]  ----- Message ----- From: Marcine Matar, MD Sent: 08/22/2022   6:27 PM EDT To: Dionne Bucy  Needs referral to Psychiatry.

## 2022-09-02 DIAGNOSIS — L309 Dermatitis, unspecified: Secondary | ICD-10-CM | POA: Diagnosis not present

## 2022-09-02 DIAGNOSIS — D485 Neoplasm of uncertain behavior of skin: Secondary | ICD-10-CM | POA: Diagnosis not present

## 2022-09-05 ENCOUNTER — Ambulatory Visit: Payer: Medicare Other | Admitting: Obstetrics and Gynecology

## 2022-09-07 ENCOUNTER — Telehealth: Payer: Self-pay | Admitting: Internal Medicine

## 2022-09-07 NOTE — Telephone Encounter (Signed)
PC placed to pt today to let her know that [facility] said they do not accept her insurance.  [facility]/Pain management has declined the referral.  We submitted to [facility] and waiting to see if they will accept the referral.  Pt states she was called by [facility] pain Management and has appt 09/15/2022.

## 2022-09-09 NOTE — Telephone Encounter (Signed)
Noted  

## 2022-09-13 DIAGNOSIS — E559 Vitamin D deficiency, unspecified: Secondary | ICD-10-CM | POA: Diagnosis not present

## 2022-09-13 DIAGNOSIS — F339 Major depressive disorder, recurrent, unspecified: Secondary | ICD-10-CM | POA: Diagnosis not present

## 2022-09-13 DIAGNOSIS — M129 Arthropathy, unspecified: Secondary | ICD-10-CM | POA: Diagnosis not present

## 2022-09-13 DIAGNOSIS — Z681 Body mass index (BMI) 19 or less, adult: Secondary | ICD-10-CM | POA: Diagnosis not present

## 2022-09-13 DIAGNOSIS — Z9181 History of falling: Secondary | ICD-10-CM | POA: Diagnosis not present

## 2022-09-13 DIAGNOSIS — Z0000 Encounter for general adult medical examination without abnormal findings: Secondary | ICD-10-CM | POA: Diagnosis not present

## 2022-09-13 DIAGNOSIS — Z79899 Other long term (current) drug therapy: Secondary | ICD-10-CM | POA: Diagnosis not present

## 2022-09-13 DIAGNOSIS — R1030 Lower abdominal pain, unspecified: Secondary | ICD-10-CM | POA: Diagnosis not present

## 2022-09-13 DIAGNOSIS — Z131 Encounter for screening for diabetes mellitus: Secondary | ICD-10-CM | POA: Diagnosis not present

## 2022-09-13 DIAGNOSIS — Z1159 Encounter for screening for other viral diseases: Secondary | ICD-10-CM | POA: Diagnosis not present

## 2022-09-13 DIAGNOSIS — N3010 Interstitial cystitis (chronic) without hematuria: Secondary | ICD-10-CM | POA: Diagnosis not present

## 2022-09-13 LAB — LAB REPORT - SCANNED
A1c: 4.8
EGFR: 69
HM Hepatitis Screen: NEGATIVE

## 2022-09-15 DIAGNOSIS — Z79899 Other long term (current) drug therapy: Secondary | ICD-10-CM | POA: Diagnosis not present

## 2022-09-19 ENCOUNTER — Encounter (HOSPITAL_COMMUNITY): Payer: Self-pay | Admitting: Psychiatry

## 2022-09-19 ENCOUNTER — Ambulatory Visit (HOSPITAL_BASED_OUTPATIENT_CLINIC_OR_DEPARTMENT_OTHER): Payer: Medicare Other | Admitting: Psychiatry

## 2022-09-19 ENCOUNTER — Telehealth (INDEPENDENT_AMBULATORY_CARE_PROVIDER_SITE_OTHER): Payer: Medicare Other | Admitting: Obstetrics and Gynecology

## 2022-09-19 VITALS — Wt 119.0 lb

## 2022-09-19 DIAGNOSIS — F4321 Adjustment disorder with depressed mood: Secondary | ICD-10-CM | POA: Diagnosis not present

## 2022-09-19 DIAGNOSIS — Z62898 Other specified problems related to upbringing: Secondary | ICD-10-CM | POA: Diagnosis not present

## 2022-09-19 DIAGNOSIS — F4312 Post-traumatic stress disorder, chronic: Secondary | ICD-10-CM | POA: Diagnosis not present

## 2022-09-19 DIAGNOSIS — F1911 Other psychoactive substance abuse, in remission: Secondary | ICD-10-CM | POA: Diagnosis not present

## 2022-09-19 DIAGNOSIS — N951 Menopausal and female climacteric states: Secondary | ICD-10-CM

## 2022-09-19 DIAGNOSIS — Z6379 Other stressful life events affecting family and household: Secondary | ICD-10-CM | POA: Diagnosis not present

## 2022-09-19 DIAGNOSIS — F4320 Adjustment disorder, unspecified: Secondary | ICD-10-CM

## 2022-09-19 DIAGNOSIS — T1490XA Injury, unspecified, initial encounter: Secondary | ICD-10-CM

## 2022-09-19 DIAGNOSIS — Z62819 Personal history of unspecified abuse in childhood: Secondary | ICD-10-CM

## 2022-09-19 MED ORDER — ESTRADIOL 2 MG PO TABS: 2.0000 mg | ORAL_TABLET | Freq: Every day | ORAL | 6 refills | Status: DC
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
## 2022-09-19 NOTE — Progress Notes (Signed)
Pt for FU, c/o night sweats.

## 2022-09-20 DIAGNOSIS — Z681 Body mass index (BMI) 19 or less, adult: Secondary | ICD-10-CM | POA: Diagnosis not present

## 2022-09-20 DIAGNOSIS — F339 Major depressive disorder, recurrent, unspecified: Secondary | ICD-10-CM | POA: Diagnosis not present

## 2022-09-20 DIAGNOSIS — R1030 Lower abdominal pain, unspecified: Secondary | ICD-10-CM | POA: Diagnosis not present

## 2022-09-20 DIAGNOSIS — K3184 Gastroparesis: Secondary | ICD-10-CM | POA: Diagnosis not present

## 2022-09-20 DIAGNOSIS — Z79899 Other long term (current) drug therapy: Secondary | ICD-10-CM | POA: Diagnosis not present

## 2022-09-20 DIAGNOSIS — Z9181 History of falling: Secondary | ICD-10-CM | POA: Diagnosis not present

## 2022-09-20 DIAGNOSIS — N3010 Interstitial cystitis (chronic) without hematuria: Secondary | ICD-10-CM | POA: Diagnosis not present

## 2022-09-20 DIAGNOSIS — R768 Other specified abnormal immunological findings in serum: Secondary | ICD-10-CM | POA: Diagnosis not present

## 2022-09-22 DIAGNOSIS — Z79899 Other long term (current) drug therapy: Secondary | ICD-10-CM | POA: Diagnosis not present

## 2022-09-25 ENCOUNTER — Other Ambulatory Visit: Payer: Self-pay | Admitting: Diagnostic Neuroimaging

## 2022-10-07 ENCOUNTER — Other Ambulatory Visit: Payer: Self-pay | Admitting: Internal Medicine

## 2022-10-07 DIAGNOSIS — F4320 Adjustment disorder, unspecified: Secondary | ICD-10-CM

## 2022-10-07 DIAGNOSIS — F4321 Adjustment disorder with depressed mood: Secondary | ICD-10-CM

## 2022-10-07 DIAGNOSIS — F3289 Other specified depressive episodes: Secondary | ICD-10-CM

## 2022-10-07 NOTE — Telephone Encounter (Signed)
Medication Refill - Medication: sertraline (ZOLOFT) 100 MG tablet [[id number]]   simvastatin (ZOCOR) 40 MG tablet [[id number]]   Has the patient contacted their pharmacy? Yes.   (Agent: If no, request that the patient contact the pharmacy for the refill. If patient does not wish to contact the pharmacy document the reason why and proceed with request.) (Agent: If yes, when and what did the pharmacy advise?)  Preferred Pharmacy (with phone number or street name):  [id number] [place], [place] - [address] AT [facility] & [address] Phone: [phone]  Fax: [phone]     Has the patient been seen for an appointment in the last year OR does the patient have an upcoming appointment? Yes.    Agent: Please be advised that RX refills may take up to 3 business days. We ask that you follow-up with your pharmacy.

## 2022-10-10 MED ORDER — SERTRALINE HCL 100 MG PO TABS: 100.0000 mg | ORAL_TABLET | Freq: Every day | ORAL | 0 refills | Status: DC

## 2022-10-10 MED ORDER — SIMVASTATIN 40 MG PO TABS: 40.0000 mg | ORAL_TABLET | Freq: Every evening | ORAL | 0 refills | Status: DC

## 2022-10-10 NOTE — Telephone Encounter (Signed)
Requested medication (s) are due for refill today: routing for review  Requested medication (s) are on the active medication list: yes  Last refill:  07/04/22  Future visit scheduled: yes  Notes to clinic:  Unable to refill per protocol, last refill by historical/ ED provider. Routing for approval.     Requested Prescriptions  Pending Prescriptions Disp Refills   simvastatin (ZOCOR) 40 MG tablet 30 tablet     Sig: Take 1 tablet (40 mg total) by mouth every evening.     Cardiovascular:  Antilipid - Statins Failed - 10/07/2022  5:23 PM      Failed - Lipid Panel in normal range within the last 12 months    Cholesterol, Total  Date Value Ref Range Status  08/22/2022 138 100 - 199 mg/dL Final   LDL Chol Calc (NIH)  Date Value Ref Range Status  08/22/2022 67 0 - 99 mg/dL Final   Direct LDL  Date Value Ref Range Status  05/29/2006 160 mg/dL    HDL  Date Value Ref Range Status  08/22/2022 54 >39 mg/dL Final   Triglycerides  Date Value Ref Range Status  08/22/2022 91 0 - 149 mg/dL Final         Passed - Patient is not pregnant      Passed - Valid encounter within last 12 months    Recent Outpatient Visits           1 month ago Interstitial cystitis   [place] [facility] Marcine Matar, MD   4 months ago Gallstones   [place] [facility] [place], [place], [place]   5 months ago Grief reaction   [facility] [place], [place], [place]   1 year ago Muscle spasm of back   [place] [facility] [facility] Marcine Matar, MD   1 year ago Restless legs syndrome   [place] [facility] [place], [place], [place]       Future Appointments             In 2 months Marcine Matar, MD [facility]            Signed Prescriptions Disp Refills   sertraline (ZOLOFT) 100 MG tablet 90 tablet 0    Sig: Take 1  tablet (100 mg total) by mouth daily.     Psychiatry:  Antidepressants - SSRI - sertraline Passed - 10/07/2022  5:23 PM      Passed - AST in normal range and within 360 days    AST  Date Value Ref Range Status  07/19/2022 24 15 - 41 U/L Final         Passed - ALT in normal range and within 360 days    ALT  Date Value Ref Range Status  07/19/2022 15 0 - 44 U/L Final         Passed - Completed PHQ-2 or PHQ-9 in the last 360 days      Passed - Valid encounter within last 6 months    Recent Outpatient Visits           1 month ago Interstitial cystitis   [place] [facility] Marcine Matar, MD   4 months ago Gallstones   [facility] [place], [place], [place]   5 months ago Grief reaction  [facility] [place], [place], [place]   1 year ago Muscle spasm of back   [facility] Marcine Matar, MD   1 year ago Restless legs syndrome   [facility] [place], [place], [place]       Future Appointments             In 2 months Laural Benes, Binnie Rail, MD [facility]

## 2022-10-10 NOTE — Telephone Encounter (Signed)
Requested Prescriptions  Pending Prescriptions Disp Refills   sertraline (ZOLOFT) 100 MG tablet 90 tablet 0    Sig: Take 1 tablet (100 mg total) by mouth daily.     Psychiatry:  Antidepressants - SSRI - sertraline Passed - 10/07/2022  5:23 PM      Passed - AST in normal range and within 360 days    AST  Date Value Ref Range Status  07/19/2022 24 15 - 41 U/L Final         Passed - ALT in normal range and within 360 days    ALT  Date Value Ref Range Status  07/19/2022 15 0 - 44 U/L Final         Passed - Completed PHQ-2 or PHQ-9 in the last 360 days      Passed - Valid encounter within last 6 months    Recent Outpatient Visits           1 month ago Interstitial cystitis   [place] [facility] Marcine Matar, MD   4 months ago Gallstones   [place] [facility], [place], [place]   5 months ago Grief reaction   [facility] [place], [place], [place]   1 year ago Muscle spasm of back   [facility] Marcine Matar, MD   1 year ago Restless legs syndrome   [place] [facility] [place], [place], [place]       Future Appointments             In 2 months Marcine Matar, MD [facility]             simvastatin (ZOCOR) 40 MG tablet 30 tablet     Sig: Take 1 tablet (40 mg total) by mouth every evening.     Cardiovascular:  Antilipid - Statins Failed - 10/07/2022  5:23 PM      Failed - Lipid Panel in normal range within the last 12 months    Cholesterol, Total  Date Value Ref Range Status  08/22/2022 138 100 - 199 mg/dL Final   LDL Chol Calc (NIH)  Date Value Ref Range Status  08/22/2022 67 0 - 99 mg/dL Final   Direct LDL  Date Value Ref Range Status  05/29/2006 160 mg/dL    HDL  Date Value Ref Range Status  08/22/2022 54 >39 mg/dL Final   Triglycerides  Date Value Ref  Range Status  08/22/2022 91 0 - 149 mg/dL Final         Passed - Patient is not pregnant      Passed - Valid encounter within last 12 months    Recent Outpatient Visits           1 month ago Interstitial cystitis   [place] [facility] Marcine Matar, MD   4 months ago Gallstones   [place] [facility] [place], [place], [place]   5 months ago Grief reaction   [facility] [place], [place], [place]   1 year ago Muscle spasm of back   [facility] Marcine Matar, MD   1 year ago Restless legs syndrome   [facility] [place], [place], [place]  Future Appointments             In 2 months Laural Benes, Binnie Rail, MD [facility]

## 2022-10-11 ENCOUNTER — Ambulatory Visit: Payer: Medicare Other | Admitting: Internal Medicine

## 2022-10-18 ENCOUNTER — Telehealth (HOSPITAL_COMMUNITY): Payer: Medicare Other | Admitting: Psychiatry

## 2022-10-19 ENCOUNTER — Telehealth (HOSPITAL_COMMUNITY): Payer: Self-pay | Admitting: *Deleted

## 2022-10-19 ENCOUNTER — Encounter (HOSPITAL_COMMUNITY): Payer: Self-pay | Admitting: Psychiatry

## 2022-10-19 ENCOUNTER — Telehealth (HOSPITAL_BASED_OUTPATIENT_CLINIC_OR_DEPARTMENT_OTHER): Payer: Medicare Other | Admitting: Psychiatry

## 2022-10-19 VITALS — Wt 116.0 lb

## 2022-10-19 DIAGNOSIS — K5909 Other constipation: Secondary | ICD-10-CM | POA: Diagnosis not present

## 2022-10-19 DIAGNOSIS — K3184 Gastroparesis: Secondary | ICD-10-CM | POA: Diagnosis not present

## 2022-10-19 DIAGNOSIS — N3010 Interstitial cystitis (chronic) without hematuria: Secondary | ICD-10-CM | POA: Diagnosis not present

## 2022-10-19 DIAGNOSIS — R768 Other specified abnormal immunological findings in serum: Secondary | ICD-10-CM | POA: Diagnosis not present

## 2022-10-19 DIAGNOSIS — F4321 Adjustment disorder with depressed mood: Secondary | ICD-10-CM | POA: Diagnosis not present

## 2022-10-19 DIAGNOSIS — R1030 Lower abdominal pain, unspecified: Secondary | ICD-10-CM | POA: Diagnosis not present

## 2022-10-19 DIAGNOSIS — F4320 Adjustment disorder, unspecified: Secondary | ICD-10-CM

## 2022-10-19 DIAGNOSIS — T1490XA Injury, unspecified, initial encounter: Secondary | ICD-10-CM | POA: Diagnosis not present

## 2022-10-19 DIAGNOSIS — Z681 Body mass index (BMI) 19 or less, adult: Secondary | ICD-10-CM | POA: Diagnosis not present

## 2022-10-19 DIAGNOSIS — F1911 Other psychoactive substance abuse, in remission: Secondary | ICD-10-CM

## 2022-10-19 DIAGNOSIS — Z9181 History of falling: Secondary | ICD-10-CM | POA: Diagnosis not present

## 2022-10-19 DIAGNOSIS — Z79899 Other long term (current) drug therapy: Secondary | ICD-10-CM | POA: Diagnosis not present

## 2022-10-19 DIAGNOSIS — F339 Major depressive disorder, recurrent, unspecified: Secondary | ICD-10-CM | POA: Diagnosis not present

## 2022-10-19 DIAGNOSIS — F4312 Post-traumatic stress disorder, chronic: Secondary | ICD-10-CM

## 2022-10-19 DIAGNOSIS — Z62819 Personal history of unspecified abuse in childhood: Secondary | ICD-10-CM

## 2022-10-19 MED ORDER — ARIPIPRAZOLE 2 MG PO TABS: 2.0000 mg | ORAL_TABLET | Freq: Every day | ORAL | 1 refills | Status: DC

## 2022-10-19 MED ORDER — SERTRALINE HCL 100 MG PO TABS: 100.0000 mg | ORAL_TABLET | Freq: Every day | ORAL | 0 refills | Status: DC

## 2022-10-19 MED ORDER — TRAZODONE HCL 50 MG PO TABS: 100.0000 mg | ORAL_TABLET | Freq: Every evening | ORAL | 1 refills | Status: DC | PRN

## 2022-10-19 NOTE — Telephone Encounter (Signed)
Writer called [facility] on [address] Twice to request latest lab results. Writer was on hold for 25 minutes first call and 20 minutes the second without ability to leave message. Will call again on next business day.
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
## 2022-10-24 DIAGNOSIS — Z79899 Other long term (current) drug therapy: Secondary | ICD-10-CM | POA: Diagnosis not present

## 2022-10-24 NOTE — Patient Instructions (Addendum)
 [name] , Thank you for taking time to come for your Medicare Wellness Visit. I appreciate your ongoing commitment to your health goals. Please review the following plan we discussed and let me know if I can assist you in the future.   These are the goals we discussed:  Goals      Remain active and independent        This is a list of the screening recommended for you and due dates:  Health Maintenance  Topic Date Due   COVID-19 Vaccine (3 - Pfizer risk series) 07/30/2019   Zoster (Shingles) Vaccine (1 of 2) 01/25/2023*   Flu Shot  11/17/2022   Screening for Lung Cancer  02/17/2023   Medicare Annual Wellness Visit  10/25/2023   Mammogram  08/25/2024   DTaP/Tdap/Td vaccine (3 - Td or Tdap) 10/16/2025   Colon Cancer Screening  09/25/2030   Pneumonia Vaccine  Completed   DEXA scan (bone density measurement)  Completed   Hepatitis C Screening  Completed   HPV Vaccine  Aged Out  *Topic was postponed. The date shown is not the original due date.    Advanced directives: Information on Advanced Care Planning can be found at [place]  Secretary of [facility] Advance Health Care Directives Advance Health Care Directives ([email or web address] Please bring a copy of your health care power of attorney and living will to the office to be added to your chart at your convenience.  Conditions/risks identified: Aim for 30 minutes of exercise or brisk walking, 6-8 glasses of water, and 5 servings of fruits and vegetables each day.  Next appointment: Follow up in one year for your annual wellness visit    Preventive Care 65 Years and Older, Female Preventive care refers to lifestyle choices and visits with your health care provider that can promote health and wellness. What does preventive care include? A yearly physical exam. This is also called an annual well check. Dental exams once or twice a year. Routine eye exams. Ask your health care provider how often you should have your eyes  checked. Personal lifestyle choices, including: Daily care of your teeth and gums. Regular physical activity. Eating a healthy diet. Avoiding tobacco and drug use. Limiting alcohol  use. Practicing safe sex. Taking low-dose aspirin  every day. Taking vitamin and mineral supplements as recommended by your health care provider. What happens during an annual well check? The services and screenings done by your health care provider during your annual well check will depend on your age, overall health, lifestyle risk factors, and family history of disease. Counseling  Your health care provider may ask you questions about your: Alcohol  use. Tobacco use. Drug use. Emotional well-being. Home and relationship well-being. Sexual activity. Eating habits. History of falls. Memory and ability to understand (cognition). Work and work astronomer. Reproductive health. Screening  You may have the following tests or measurements: Height, weight, and BMI. Blood pressure. Lipid and cholesterol levels. These may be checked every 5 years, or more frequently if you are over 76 years old. Skin check. Lung cancer screening. You may have this screening every year starting at age 77 if you have a 30-pack-year history of smoking and currently smoke or have quit within the past 15 years. Fecal occult blood test (FOBT) of the stool. You may have this test every year starting at age 71. Flexible sigmoidoscopy or colonoscopy. You may have a sigmoidoscopy every 5 years or a colonoscopy every 10 years starting at age 66. Hepatitis C blood test. Hepatitis  B blood test. Sexually transmitted disease (STD) testing. Diabetes screening. This is done by checking your blood sugar (glucose) after you have not eaten for a while (fasting). You may have this done every 1-3 years. Bone density scan. This is done to screen for osteoporosis. You may have this done starting at age 50. Mammogram. This may be done every 1-2  years. Talk to your health care provider about how often you should have regular mammograms. Talk with your health care provider about your test results, treatment options, and if necessary, the need for more tests. Vaccines  Your health care provider may recommend certain vaccines, such as: Influenza vaccine. This is recommended every year. Tetanus, diphtheria, and acellular pertussis (Tdap, Td) vaccine. You may need a Td booster every 10 years. Zoster vaccine. You may need this after age 42. Pneumococcal 13-valent conjugate (PCV13) vaccine. One dose is recommended after age 19. Pneumococcal polysaccharide (PPSV23) vaccine. One dose is recommended after age 76. Talk to your health care provider about which screenings and vaccines you need and how often you need them. This information is not intended to replace advice given to you by your health care provider. Make sure you discuss any questions you have with your health care provider. Document Released: 05/01/2015 Document Revised: 12/23/2015 Document Reviewed: 02/03/2015 Elsevier Interactive Patient Education  2017 [employer].  Fall Prevention in the Home Falls can cause injuries. They can happen to people of all ages. There are many things you can do to make your home safe and to help prevent falls. What can I do on the outside of my home? Regularly fix the edges of walkways and driveways and fix any cracks. Remove anything that might make you trip as you walk through a door, such as a raised step or threshold. Trim any bushes or trees on the path to your home. Use bright outdoor lighting. Clear any walking paths of anything that might make someone trip, such as rocks or tools. Regularly check to see if handrails are loose or broken. Make sure that both sides of any steps have handrails. Any raised decks and porches should have guardrails on the edges. Have any leaves, snow, or ice cleared regularly. Use sand or salt on walking paths  during winter. Clean up any spills in your garage right away. This includes oil or grease spills. What can I do in the bathroom? Use night lights. Install grab bars by the toilet and in the tub and shower. Do not use towel bars as grab bars. Use non-skid mats or decals in the tub or shower. If you need to sit down in the shower, use a plastic, non-slip stool. Keep the floor dry. Clean up any water that spills on the floor as soon as it happens. Remove soap buildup in the tub or shower regularly. Attach bath mats securely with double-sided non-slip rug tape. Do not have throw rugs and other things on the floor that can make you trip. What can I do in the bedroom? Use night lights. Make sure that you have a light by your bed that is easy to reach. Do not use any sheets or blankets that are too big for your bed. They should not hang down onto the floor. Have a firm chair that has side arms. You can use this for support while you get dressed. Do not have throw rugs and other things on the floor that can make you trip. What can I do in the kitchen? Clean up any  spills right away. Avoid walking on wet floors. Keep items that you use a lot in easy-to-reach places. If you need to reach something above you, use a strong step stool that has a grab bar. Keep electrical cords out of the way. Do not use floor polish or wax that makes floors slippery. If you must use wax, use non-skid floor wax. Do not have throw rugs and other things on the floor that can make you trip. What can I do with my stairs? Do not leave any items on the stairs. Make sure that there are handrails on both sides of the stairs and use them. Fix handrails that are broken or loose. Make sure that handrails are as long as the stairways. Check any carpeting to make sure that it is firmly attached to the stairs. Fix any carpet that is loose or worn. Avoid having throw rugs at the top or bottom of the stairs. If you do have throw  rugs, attach them to the floor with carpet tape. Make sure that you have a light switch at the top of the stairs and the bottom of the stairs. If you do not have them, ask someone to add them for you. What else can I do to help prevent falls? Wear shoes that: Do not have high heels. Have rubber bottoms. Are comfortable and fit you well. Are closed at the toe. Do not wear sandals. If you use a stepladder: Make sure that it is fully opened. Do not climb a closed stepladder. Make sure that both sides of the stepladder are locked into place. Ask someone to hold it for you, if possible. Clearly mark and make sure that you can see: Any grab bars or handrails. First and last steps. Where the edge of each step is. Use tools that help you move around (mobility aids) if they are needed. These include: Canes. Walkers. Scooters. Crutches. Turn on the lights when you go into a dark area. Replace any light bulbs as soon as they burn out. Set up your furniture so you have a clear path. Avoid moving your furniture around. If any of your floors are uneven, fix them. If there are any pets around you, be aware of where they are. Review your medicines with your doctor. Some medicines can make you feel dizzy. This can increase your chance of falling. Ask your doctor what other things that you can do to help prevent falls. This information is not intended to replace advice given to you by your health care provider. Make sure you discuss any questions you have with your health care provider. Document Released: 01/29/2009 Document Revised: 09/10/2015 Document Reviewed: 05/09/2014 Elsevier Interactive Patient Education  2017 [employer].
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
## 2022-10-25 ENCOUNTER — Ambulatory Visit: Payer: Medicare Other

## 2022-10-25 VITALS — Ht 65.0 in | Wt 116.0 lb

## 2022-10-25 DIAGNOSIS — Z79899 Other long term (current) drug therapy: Secondary | ICD-10-CM | POA: Diagnosis not present

## 2022-10-25 DIAGNOSIS — H30033 Focal chorioretinal inflammation, peripheral, bilateral: Secondary | ICD-10-CM | POA: Diagnosis not present

## 2022-10-25 DIAGNOSIS — Z0000 Encounter for general adult medical examination without abnormal findings: Secondary | ICD-10-CM | POA: Diagnosis not present

## 2022-10-25 DIAGNOSIS — H35372 Puckering of macula, left eye: Secondary | ICD-10-CM | POA: Diagnosis not present

## 2022-10-25 DIAGNOSIS — H2513 Age-related nuclear cataract, bilateral: Secondary | ICD-10-CM | POA: Diagnosis not present

## 2022-10-26 DIAGNOSIS — R1010 Upper abdominal pain, unspecified: Secondary | ICD-10-CM | POA: Diagnosis not present

## 2022-10-26 DIAGNOSIS — R768 Other specified abnormal immunological findings in serum: Secondary | ICD-10-CM | POA: Diagnosis not present

## 2022-10-26 DIAGNOSIS — K3184 Gastroparesis: Secondary | ICD-10-CM | POA: Diagnosis not present

## 2022-10-26 DIAGNOSIS — R112 Nausea with vomiting, unspecified: Secondary | ICD-10-CM | POA: Diagnosis not present

## 2022-10-27 ENCOUNTER — Ambulatory Visit (INDEPENDENT_AMBULATORY_CARE_PROVIDER_SITE_OTHER): Payer: Medicare Other | Admitting: Clinical

## 2022-10-27 ENCOUNTER — Encounter (HOSPITAL_COMMUNITY): Payer: Self-pay | Admitting: Clinical

## 2022-10-27 DIAGNOSIS — F1911 Other psychoactive substance abuse, in remission: Secondary | ICD-10-CM

## 2022-10-27 DIAGNOSIS — F4312 Post-traumatic stress disorder, chronic: Secondary | ICD-10-CM | POA: Diagnosis not present

## 2022-10-27 DIAGNOSIS — F419 Anxiety disorder, unspecified: Secondary | ICD-10-CM | POA: Diagnosis not present

## 2022-10-27 DIAGNOSIS — F1091 Alcohol use, unspecified, in remission: Secondary | ICD-10-CM | POA: Diagnosis not present

## 2022-10-27 DIAGNOSIS — F1310 Sedative, hypnotic or anxiolytic abuse, uncomplicated: Secondary | ICD-10-CM | POA: Diagnosis not present

## 2022-10-27 DIAGNOSIS — F32A Depression, unspecified: Secondary | ICD-10-CM | POA: Diagnosis not present

## 2022-10-27 DIAGNOSIS — F1491 Cocaine use, unspecified, in remission: Secondary | ICD-10-CM

## 2022-10-28 ENCOUNTER — Encounter (HOSPITAL_COMMUNITY): Payer: Self-pay
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
## 2022-11-02 ENCOUNTER — Telehealth (HOSPITAL_COMMUNITY): Payer: Self-pay | Admitting: *Deleted

## 2022-11-02 NOTE — Telephone Encounter (Signed)
Writer spoke with pt who called with c/o dramatic weight loss she says. Pt states she is very concerned about this issue and wanted to let you both know. Pt was advised to contact PCP to address. Pt says that she has brought this issue up multiple times with no answers. Pt is very anxious about this and feels she is wasting away she says. Writer stressed importance of addressing this directly with PCP directly and advised to call their office asap. Pt will call or an emergency visit she says however transportation is an issue for pt. Pt is drinking Ensure but has decreased appetite in general. Pt is also going to contact [facility] for latest lab results as this nurse has not been able to speak with anyone at [facility] as each time I call am placed on indefinite hold. When labs received I wil update and have them scanned to pt chart. Support and reassurance provided. FYI.

## 2022-11-02 NOTE — Telephone Encounter (Signed)
I need records from [facility].  As per patient she was given the diagnosis of hepatitis C and she was waiting for GI appointment.  Weight loss should be addressed by PCP.

## 2022-11-03 ENCOUNTER — Telehealth: Payer: Self-pay | Admitting: Internal Medicine

## 2022-11-03 ENCOUNTER — Telehealth (HOSPITAL_COMMUNITY): Payer: Self-pay | Admitting: *Deleted

## 2022-11-03 NOTE — Telephone Encounter (Deleted)
Pt would like to know if you (dr Laural Benes)

## 2022-11-03 NOTE — Telephone Encounter (Signed)
Pt LVM for writer to advise that she'd been to, or spoken with, a nurse at [facility] regarding her labs and specifically Hep C antibody test. Pt says she was told the test was Negative and that all further GI will be cx because she is Negative for Hep C. Writer was able to speak to staff at [facility] on [address] and they are to fax over all lab results from last month. Will update when labs received. FYI.

## 2022-11-09 DIAGNOSIS — Z961 Presence of intraocular lens: Secondary | ICD-10-CM | POA: Diagnosis not present

## 2022-11-09 DIAGNOSIS — H3022 Posterior cyclitis, left eye: Secondary | ICD-10-CM | POA: Diagnosis not present

## 2022-11-09 DIAGNOSIS — H538 Other visual disturbances: Secondary | ICD-10-CM | POA: Diagnosis not present

## 2022-11-09 DIAGNOSIS — H524 Presbyopia: Secondary | ICD-10-CM | POA: Diagnosis not present

## 2022-11-11 ENCOUNTER — Telehealth (HOSPITAL_COMMUNITY): Payer: Self-pay | Admitting: *Deleted

## 2022-11-11 NOTE — Telephone Encounter (Signed)
Writer called [facility] on [address] again to request latest lab results. Writer was transferred to the "head nurse" however had to LVM. Information provided with our fax number, main number, and my direct line.

## 2022-11-14 ENCOUNTER — Ambulatory Visit: Payer: Self-pay

## 2022-11-14 NOTE — Telephone Encounter (Signed)
  Chief Complaint: Weight loss - 29 lbs since November. 1 lb overnight. Symptoms: Weight loss and diarrhea Frequency: Diarrhea since starting linzess Pertinent Negatives: Patient denies  Disposition: [] ED /[] Urgent Care (no appt availability in office) / [] Appointment(In office/virtual)/ []  [facility]/ [] Home Care/ [] Refused Recommended Disposition /[] [facility]/ [x]  Follow-up with PCP Additional Notes: Pt has transportation issues. She needs a 3 day lead time in order to get a ride to office. Pt is unable to go to Mobile unit because of this. Pt has lost 29 lbs since November and lost 1 lb overnight. Pt also states that she has had diarrhea since starting Linzess. Pt would like to be seen in the office this week.  Pt requests a call back. Please advise.   Reason for Disposition  SEVERE weight loss (e.g., BMI < 15; weight loss that is rapid; taking little or no food by mouth)  Answer Assessment - Initial Assessment Questions 1. MAIN CONCERN: "What is your main concern today?"     Weight loss 2. WEIGHT LOSS: "How much weight have you lost?"  (e.g., lbs., kgs.)  "Over what period of time have you lost this weight?"  (e.g., number of days, weeks, months, years)     29 since November 3. BASELINE WEIGHT: "What is your baseline or normal weight?" (e.g., "How much do you usually weigh?")     111 lbs 4. CAUSE: "What do you think is causing the weight loss?" (e.g., depression, anxiety, medicine side effect, pain, trouble swallowing, substance or alcohol use problem, eating disorder)     Unsure 5. PRIOR EVALUATION: "Have you been evaluated by a doctor for your weight loss?" If Yes, ask "When was your last visit?" "What did your doctor (or NP/PA) tell you about the possible cause?"     Yes  7. OTHER SYMPTOMS: "Do you have any other symptoms?" (e.g., anxiety or depression, blood in stool, breathing difficulty, diarrhea, fever, trouble swallowing)     Depression -  medications  Protocols used: Weight Loss - Unintended-A-AH

## 2022-11-14 NOTE — Telephone Encounter (Addendum)
Patient declines [facility] and [facility] request appointment with pcp. Advised no appointments available with her PCP. patient agreed to  Appointment with an other provider on 11/30/2022

## 2022-11-16 DIAGNOSIS — R768 Other specified abnormal immunological findings in serum: Secondary | ICD-10-CM | POA: Diagnosis not present

## 2022-11-16 DIAGNOSIS — Z79899 Other long term (current) drug therapy: Secondary | ICD-10-CM | POA: Diagnosis not present

## 2022-11-16 DIAGNOSIS — Z9181 History of falling: Secondary | ICD-10-CM | POA: Diagnosis not present

## 2022-11-16 DIAGNOSIS — F339 Major depressive disorder, recurrent, unspecified: Secondary | ICD-10-CM | POA: Diagnosis not present

## 2022-11-16 DIAGNOSIS — K5909 Other constipation: Secondary | ICD-10-CM | POA: Diagnosis not present

## 2022-11-16 DIAGNOSIS — Z681 Body mass index (BMI) 19 or less, adult: Secondary | ICD-10-CM | POA: Diagnosis not present

## 2022-11-16 DIAGNOSIS — N3010 Interstitial cystitis (chronic) without hematuria: Secondary | ICD-10-CM | POA: Diagnosis not present

## 2022-11-16 DIAGNOSIS — R1030 Lower abdominal pain, unspecified: Secondary | ICD-10-CM | POA: Diagnosis not present

## 2022-11-16 DIAGNOSIS — K3184 Gastroparesis: Secondary | ICD-10-CM | POA: Diagnosis not present

## 2022-11-18 DIAGNOSIS — Z79899 Other long term (current) drug therapy: Secondary | ICD-10-CM | POA: Diagnosis not present

## 2022-11-18 NOTE — Telephone Encounter (Signed)
Opened in error

## 2022-11-30 ENCOUNTER — Ambulatory Visit: Payer: Medicare Other | Attending: Physician Assistant | Admitting: Physician Assistant

## 2022-11-30 ENCOUNTER — Telehealth (HOSPITAL_COMMUNITY): Payer: Medicare Other | Admitting: Psychiatry

## 2022-11-30 ENCOUNTER — Encounter: Payer: Self-pay | Admitting: Physician Assistant

## 2022-11-30 VITALS — BP 96/60 | HR 80 | Wt 115.0 lb

## 2022-11-30 DIAGNOSIS — R634 Abnormal weight loss: Secondary | ICD-10-CM | POA: Insufficient documentation

## 2022-11-30 DIAGNOSIS — Z79899 Other long term (current) drug therapy: Secondary | ICD-10-CM | POA: Diagnosis not present

## 2022-11-30 DIAGNOSIS — F4310 Post-traumatic stress disorder, unspecified: Secondary | ICD-10-CM | POA: Insufficient documentation

## 2022-11-30 DIAGNOSIS — F419 Anxiety disorder, unspecified: Secondary | ICD-10-CM | POA: Diagnosis not present

## 2022-11-30 DIAGNOSIS — Z9049 Acquired absence of other specified parts of digestive tract: Secondary | ICD-10-CM | POA: Diagnosis not present

## 2022-11-30 MED ORDER — MEGESTROL ACETATE 20 MG PO TABS: 20.0000 mg | ORAL_TABLET | Freq: Every day | ORAL | 2 refills | Status: DC
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
## 2022-11-30 NOTE — Patient Instructions (Signed)
Preventing Consequences of Unhealthy Weight Loss Behaviors, Adult Reaching and maintaining a healthy weight is important for your overall health. A healthy weight will vary from person to person. It is natural to want to lose weight quickly, using whatever methods seem fastest. However, losing weight in a healthy way is not a quick process. Instead, aim for slow, steady weight loss by making small changes and setting achievable goals. How can unhealthy weight loss behaviors affect me? Using unhealthy behaviors to try to lose weight can cause: Tiredness (fatigue), low heart rate, and low blood pressure. Imbalances in your body. These may be imbalances in: Electrolytes. These are salts and minerals in your blood. Chemicals. These are needed so your body can work properly. Body fluids. Loss of fluids may lead to dehydration. Organ damage or organ failure, especially affecting the kidneys. Thin bones that break easily. Loneliness or relationship problems with your friends and family. Emotional problems, including depression and anxiety. Changing unhealthy weight loss behaviors through lifestyle changes will improve your overall health. Maintaining a healthy weight also lowers your risk of certain conditions, such as: Obesity. Heart disease, high cholesterol, and high blood pressure. Type 2 diabetes. Breathing and sleeping disorders. Stroke. Osteoarthritis. This affects your joints. Osteoporosis. This affects your bones. Some cancers. What can increase my risk? Certain views or feelings about yourself and certain habits can increase your risk of unhealthy weight loss behaviors. These include: Having depression and being overweight as an adult. Attempting weight loss as a child or teen. Using alcohol, drugs, or tobacco products. What actions can I take to prevent these behaviors? You can make certain lifestyle changes to help you lose weight in a healthy way. These include eating nutritious  foods and exercising regularly. Nutrition  Eat a variety of healthy foods, including fruits and vegetables, whole grains, lean proteins, and low-fat dairy products. Drink water instead of sugary drinks such as juice, soda, or sports drinks. Drink enough fluid to keep your urine pale yellow. Plan healthy, balanced meals. Work with a dietitian to make a healthy meal plan that works for you. Limit the following: Foods that are high in fat, salt (sodium), or sugar. These include candy, donuts, pizza, and fast foods. Fried or heavily processed foods. Lifestyle Avoid these unhealthy eating habits: Following a diet that restricts entire types of food. This may be a popular diet that promises extreme results in a short time. Skipping meals to save calories. Not eating anything for long periods of time (fasting). Restricting your calories to far fewer than the number that you need to lose or maintain a healthy weight. Taking laxative pills to make you have more frequent bowel movements. Taking medicines to make your body lose excess fluids (diuretics). Eating an excessive amount of food and then making yourself vomit. This is known as bingeing and purging. Do not use any products that contain nicotine or tobacco. These products include cigarettes, chewing tobacco, and vaping devices, such as e-cigarettes. If you need help quitting, ask your health care provider. Alcohol use Do not drink alcohol if: Your health care provider tells you not to drink. You are pregnant, may be pregnant, or are planning to become pregnant. If you drink alcohol: Limit how much you have to: 0-1 drink a day for women. 0-2 drinks a day for men. Know how much alcohol is in your drink. In the [place] one drink equals one 12 oz bottle of beer (355 mL), one 5 oz glass of wine (148 mL), or one 1   oz glass of hard liquor (44 mL). Activity  Avoid compulsively getting an extreme amount of exercise. Work with a dietitian to make a  healthy exercise program. Include different types of exercise in your exercise program, such as strengthening, aerobic, and flexibility exercises. To maintain your weight, get at least 150 minutes of moderate-intensity exercise each week. Moderate-intensity exercise could be brisk walking or biking. To lose a healthy amount of weight, get 60 minutes of moderate-intensity exercise each day. Find ways to reduce stress, such as regular exercise or meditation. Find a hobby or other activity that you enjoy to distract you from eating when you feel stressed or bored. Where to find support For more support, talk with: Your health care provider or dietitian. Ask about support groups. A mental health care provider. Family and friends. Where to find more information Learn more about how to prevent complications from unhealthy weight loss behaviors from: Centers for Disease Control and Prevention: www.cdc.gov National Institute of Mental Health: www.nimh.nih.gov National Eating Disorders Association: www.nationaleatingdisorders.org Contact a health care provider if: You often feel very tired. You notice changes in your skin or your hair. You faint because of dehydration or too much exercise. You struggle to change your unhealthy weight loss behaviors on your own. Unhealthy weight loss behaviors are affecting your daily life or your relationships. You have signs or symptoms of an eating disorder. You have major weight changes in a short period of time. You feel guilty or ashamed about eating or exercising. Summary Using unhealthy eating behaviors to try to lose weight can cause a variety of physical and emotional problems that affect your overall health and well-being. Aim for slow, steady weight loss by choosing healthy foods, avoiding unhealthy eating habits, and exercising regularly. Contact your health care provider if you struggle to change your behaviors on your own or if you think that you may  have an eating disorder. This information is not intended to replace advice given to you by your health care provider. Make sure you discuss any questions you have with your health care provider. Document Revised: 11/17/2020 Document Reviewed: 11/17/2020 Elsevier Patient Education  2024 Elsevier Inc.  

## 2022-12-05 ENCOUNTER — Ambulatory Visit (HOSPITAL_COMMUNITY): Payer: Medicare Other | Admitting: Clinical

## 2022-12-08 ENCOUNTER — Encounter (HOSPITAL_COMMUNITY): Payer: Self-pay | Admitting: Clinical

## 2022-12-08 ENCOUNTER — Ambulatory Visit (INDEPENDENT_AMBULATORY_CARE_PROVIDER_SITE_OTHER): Payer: Medicare Other | Admitting: Clinical

## 2022-12-08 DIAGNOSIS — F1091 Alcohol use, unspecified, in remission: Secondary | ICD-10-CM

## 2022-12-08 DIAGNOSIS — F4312 Post-traumatic stress disorder, chronic: Secondary | ICD-10-CM | POA: Diagnosis not present

## 2022-12-08 DIAGNOSIS — F1491 Cocaine use, unspecified, in remission: Secondary | ICD-10-CM

## 2022-12-08 DIAGNOSIS — F330 Major depressive disorder, recurrent, mild: Secondary | ICD-10-CM
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
## 2022-12-12 ENCOUNTER — Other Ambulatory Visit (HOSPITAL_COMMUNITY): Payer: Self-pay

## 2022-12-12 DIAGNOSIS — T1490XA Injury, unspecified, initial encounter: Secondary | ICD-10-CM

## 2022-12-12 DIAGNOSIS — Z62819 Personal history of unspecified abuse in childhood: Secondary | ICD-10-CM

## 2022-12-12 DIAGNOSIS — F4312 Post-traumatic stress disorder, chronic: Secondary | ICD-10-CM

## 2022-12-12 MED ORDER — ARIPIPRAZOLE 2 MG PO TABS: 2.0000 mg | ORAL_TABLET | Freq: Every day | ORAL | 2 refills | Status: DC

## 2022-12-13 ENCOUNTER — Ambulatory Visit: Payer: Medicare Other | Admitting: Nurse Practitioner

## 2022-12-16 DIAGNOSIS — Z79899 Other long term (current) drug therapy: Secondary | ICD-10-CM | POA: Diagnosis not present

## 2022-12-16 DIAGNOSIS — Z681 Body mass index (BMI) 19 or less, adult: Secondary | ICD-10-CM | POA: Diagnosis not present

## 2022-12-16 DIAGNOSIS — K5909 Other constipation: Secondary | ICD-10-CM | POA: Diagnosis not present

## 2022-12-16 DIAGNOSIS — N3010 Interstitial cystitis (chronic) without hematuria: Secondary | ICD-10-CM | POA: Diagnosis not present

## 2022-12-16 DIAGNOSIS — K3184 Gastroparesis: Secondary | ICD-10-CM | POA: Diagnosis not present

## 2022-12-16 DIAGNOSIS — R1030 Lower abdominal pain, unspecified: Secondary | ICD-10-CM | POA: Diagnosis not present

## 2022-12-16 DIAGNOSIS — Z9181 History of falling: Secondary | ICD-10-CM | POA: Diagnosis not present

## 2022-12-16 DIAGNOSIS — R768 Other specified abnormal immunological findings in serum: Secondary | ICD-10-CM | POA: Diagnosis not present

## 2022-12-16 DIAGNOSIS — F339 Major depressive disorder, recurrent, unspecified: Secondary | ICD-10-CM | POA: Diagnosis not present

## 2022-12-21 DIAGNOSIS — Z79899 Other long term (current) drug therapy: Secondary | ICD-10-CM | POA: Diagnosis not present

## 2022-12-22 ENCOUNTER — Ambulatory Visit (INDEPENDENT_AMBULATORY_CARE_PROVIDER_SITE_OTHER): Payer: Medicare Other | Admitting: Clinical

## 2022-12-22 ENCOUNTER — Encounter (HOSPITAL_COMMUNITY): Payer: Self-pay | Admitting: Clinical

## 2022-12-22 ENCOUNTER — Ambulatory Visit: Payer: Medicare Other | Admitting: Physician Assistant

## 2022-12-22 DIAGNOSIS — F1091 Alcohol use, unspecified, in remission: Secondary | ICD-10-CM | POA: Diagnosis not present

## 2022-12-22 DIAGNOSIS — F4312 Post-traumatic stress disorder, chronic: Secondary | ICD-10-CM | POA: Diagnosis not present

## 2022-12-22 DIAGNOSIS — F330 Major depressive disorder, recurrent, mild: Secondary | ICD-10-CM

## 2022-12-22 DIAGNOSIS — F1491 Cocaine use, unspecified, in remission: Secondary | ICD-10-CM

## 2022-12-22 NOTE — Progress Notes (Signed)
THERAPIST PROGRESS NOTE  Session Time: 4:05-5:05pm  Session #3  Participation Level: Active  Behavioral Response: Casual Alert Depressed  Type of Therapy: Individual Therapy  Treatment Goals addressed:  LTG: Reduce frequency, intensity, and duration of depression symptoms so that daily functioning is improved LTG: Increase coping skills to manage depression and improve ability to perform daily activities LTG: [name] will score less than 9 on the Patient Health Questionnaire (PHQ-9) STG: [name] will participate in at least 80% of scheduled individual psychotherapy sessions STG: [name] will identify cognitive patterns and beliefs that support depression LTG: Elimination of maladaptive behaviors and thinking patterns which interfere with resolution of trauma as evidenced by self-report and clinical observation LTG: Develop and implement effective coping skills to carry out normal responsibilities and participate constructively in relationships as evidenced by self-report STG: [name] will practice emotion regulation skills 3 time(s) per week for the next 26 week(s) STG: [name] will practice conflict resolution skills at least 1 times per week for the next 26 weeks STG: [name] will identify negative coping strategies that have been used to cope with the feelings associated with the trauma STG: [name] will identify coping strategies to deal with trauma memories and the associated emotional reaction LTG: [name] will improve quality of life by maintaining ongoing abstinence from all mood-altering substances LTG: [name] will increase coping skills to promote long-term recovery and improve ability to perform daily activities STG: [name] will identify 3 personal recovery goals  ProgressTowards Goals: Progressing  Interventions: Supportive, Anger Management Training, and Other: grief counseling  Summary: [name] is a 72 y.o. female who presents with chronic PTSD, substance use disorders in  sustained remission, significant weight loss, and multiple medical issues. She shared immediately upon entering office that she had written 6 more pages to letter she will send to "bad brother's" boss.  CSW read these pages then asked what patient's expectation is with regard to what will happen as a result of her sending this to his boss.  She stated she has no expectation and is not going to pursue it further, as it has exhausted her and she is ready to let it go.  She stated she has nothing more to say or do on the matter, and as a result she feels that she is finally healing, is no longer consumed with it, and has actually started eating better and has gained 4 pounds.  Much of the session was spent in processing other relationships she has with family members.  She has burned a lot of bridges, it seems, including the most impact full which was with her son [name].  She has seen him 1 time in 15 years.  Her niece was a link to information about her son and new granddaughter until she died, and then her nephew became that link.  She has angered him, however, and now he has not responded to any text messages in 3 months.  She hates the thought that perhaps her nephew knows that her son has decided not to have anything to do with her, and he does not want to be the one to tell her.  CSW processed with her that regardless of who tells her, that may be the truth although at this point she does not know and has to sit with the unknowing.  She stated she has nobody left in her life since she has nothing to do with her "bad brother," her older brother is consumed with his own millionaire life in [place]  and ignores her questions, and her son wants nothing to do with her.  We talked about ways she can accept where her life is on a daily basis, including a return to AA.  She wanted to know how to reach out to her nephew, was encouraged to only make declarative statements such as "You are loved" rather than to ask  questions that he may feel are her only reason for contact.  She was able to accept this.  She states she will consider a return to AA, but is not enthusiastic.  She did say she has one friend "so I'm not totally alone."  Suicidal/Homicidal: No without intent/plan  Therapist Response: Patient is progressing AEB engaging in scheduled therapy session.  She presented oriented x5 and is compliant with her medication regimen.  Patient stated she is tired of hanging onto the feelings about her brother, so she is going to let it go as soon as she mails off the letter to his boss.  Session was spent in processing the place she is at in life right now with many of her family members deceased or out of touch for various reasons that she regrets but cannot change.  Throughout the session, CSW gave patient the opportunity to explore thoughts and feelings associated with current life situations and past/present external stressors.   CSW encouraged patient's expression of feelings and validated patient's thoughts using empathy, active listening, open body language, and unconditional positive regard.        Plan: Return again in 2 weeks. Next appointment:  9/19  Recommendations:  Return to therapy in 2 weeks, engage in self care behaviors, attend AA/NA meeting(s) in person or online, read about cognitive distortions & how to challenge them  Diagnosis:  Chronic post-traumatic stress disorder (PTSD)  Cocaine use disorder in remission  Alcohol use disorder in remission  Mild episode of recurrent major depressive disorder (HCC)  Collaboration of Care: Psychiatrist AEB - doctor can read therapy notes as needed  Patient/Guardian was advised Release of Information must be obtained prior to any record release in order to collaborate their care with an outside provider. Patient/Guardian was advised if they have not already done so to contact the registration department to sign all necessary forms in order for [place]  to release information regarding their care.   Consent: Patient/Guardian gives verbal consent for treatment and assignment of benefits for services provided during this visit. Patient/Guardian expressed understanding and agreed to proceed.   [name], LCSW 12/22/2022

## 2022-12-27 ENCOUNTER — Ambulatory Visit: Payer: Medicare Other | Admitting: Internal Medicine

## 2022-12-28 ENCOUNTER — Ambulatory Visit: Payer: Medicare Other | Admitting: Physician Assistant

## 2023-01-02 ENCOUNTER — Telehealth (INDEPENDENT_AMBULATORY_CARE_PROVIDER_SITE_OTHER): Payer: Self-pay | Admitting: Internal Medicine

## 2023-01-02 NOTE — Telephone Encounter (Signed)
Copied from CRM [id number]. Topic: General - Other >> Jan 02, 2023  1:55 PM Everette C wrote: Reason for CRM: The patient has been directed by their Dentist to request a letter for medical clearance to allow teeth extraction for the patient   The patient saw their Dentist on 12/26/22  The patient was told that a letter for clearance was going to be faxed to the practice  The patient has called to confirm receipt of the letter and check the status of it's completion   Please contact further when possible >> Jan 02, 2023  2:29 PM Marlow Baars wrote: The patient called in to tell the provider that the letter from her dentist office was sent to the attention of [place] not Dr Laural Benes. If the patient doesn't hear anything back in the next 2 days she will talk with Marylene Land about it on Wednesday at her appt

## 2023-01-03 NOTE — Telephone Encounter (Signed)
Patient was called and a pre-op appointment was scheduled for 01/07/2023. Dorathy Kinsman will be assisting that provider during that visit and will keep forms until that visit. FYI

## 2023-01-03 NOTE — Telephone Encounter (Signed)
Noted will fax paperwork once completed

## 2023-01-04 ENCOUNTER — Ambulatory Visit: Payer: Medicare Other | Admitting: Physician Assistant

## 2023-01-05 ENCOUNTER — Ambulatory Visit (INDEPENDENT_AMBULATORY_CARE_PROVIDER_SITE_OTHER): Payer: 59 | Admitting: Clinical

## 2023-01-05 ENCOUNTER — Encounter (HOSPITAL_COMMUNITY): Payer: Self-pay | Admitting: Clinical

## 2023-01-05 DIAGNOSIS — F1091 Alcohol use, unspecified, in remission: Secondary | ICD-10-CM | POA: Diagnosis not present

## 2023-01-05 DIAGNOSIS — F4312 Post-traumatic stress disorder, chronic: Secondary | ICD-10-CM

## 2023-01-05 DIAGNOSIS — F1491 Cocaine use, unspecified, in remission: Secondary | ICD-10-CM

## 2023-01-05 DIAGNOSIS — F330 Major depressive disorder, recurrent, mild: Secondary | ICD-10-CM | POA: Diagnosis not present

## 2023-01-05 NOTE — Progress Notes (Signed)
THERAPIST PROGRESS NOTE  Session Time: 3:00pm-3:48pm  Session #4  Virtual Visit via Video Note  I connected with [name] on 01/05/23 at  3:00 PM EDT by a video enabled telemedicine application and verified that I am speaking with the correct person using two identifiers.  Location: Patient: home Provider: [facility] outpatient therapy office      I discussed the limitations of evaluation and management by telemedicine and the availability of in person appointments. The patient expressed understanding and agreed to proceed.   I discussed the assessment and treatment plan with the patient. The patient was provided an opportunity to ask questions and all were answered. The patient agreed with the plan and demonstrated an understanding of the instructions.   The patient was advised to call back or seek an in-person evaluation if the symptoms worsen or if the condition fails to improve as anticipated.  I provided 48 minutes of non-face-to-face time during this encounter.   Patient was unable to get on video so the appointment was turned into a phone appointment.  [name], LCSW    Participation Level: Active  Behavioral Response: Casual Alert Irritable and not feeling well  Type of Therapy: Individual Therapy  Treatment Goals addressed:  LTG: Reduce frequency, intensity, and duration of depression symptoms so that daily functioning is improved LTG: Increase coping skills to manage depression and improve ability to perform daily activities LTG: Brittanyann will score less than 9 on the Patient Health Questionnaire (PHQ-9) STG: Deveda will participate in at least 80% of scheduled individual psychotherapy sessions STG: Leontine will identify cognitive patterns and beliefs that support depression LTG: Elimination of maladaptive behaviors and thinking patterns which interfere with resolution of trauma as evidenced by self-report and clinical observation LTG: Develop and  implement effective coping skills to carry out normal responsibilities and participate constructively in relationships as evidenced by self-report STG: Santeria will practice emotion regulation skills 3 time(s) per week for the next 26 week(s) STG: Pattyann will practice conflict resolution skills at least 1 times per week for the next 26 weeks STG: Noya will identify negative coping strategies that have been used to cope with the feelings associated with the trauma STG: Riverlyn will identify coping strategies to deal with trauma memories and the associated emotional reaction LTG: Janeeka will improve quality of life by maintaining ongoing abstinence from all mood-altering substances LTG: Tasia will increase coping skills to promote long-term recovery and improve ability to perform daily activities STG: Bessy will identify 3 personal recovery goals  ProgressTowards Goals: Progressing  Interventions: Supportive, Anger Management Training, and Other: grief counseling  Summary: RONASIA DISILVESTRO is a 72 y.o. female who presents with chronic PTSD, substance use disorders in sustained remission, significant weight loss, and multiple medical issues. Patient wishes to talk about an old [employer] Housing complaint that is in process on a former apartment complex.  She just wanted to process her feelings surrounding the fact that it is probably going to be dropped.  She talked about the letter she wrote to her brother's boss, has not sent it yet.  She kept stating she is no longer invested in thinking about that, but kept bringing it back up.  Each time she did catch herself and showed insight by pointing this out and stopping.  We processed her sobriety and whether she might return to [employer]  She does not like to read, so was told about phone app [employer] which has readings of the big book  and 12 steps and 12 traditions.  She processed various things about her son's childhood and early adulthood and stated that she is  72yo and does not know how much longer she will be alive.  She really would like to see her son and granddaughter who will be turning 25 months old.  This was processed to the point she could  have peace about it, at least at the moment.  She also was not feeling well and we talked about that and about not isolating herself when she feels better.    Suicidal/Homicidal: No without intent/plan  Therapist Response: Patient is progressing AEB engaging in scheduled therapy session.  She presented oriented x5 and stated she was feeling "not that well."  CSW evaluated patient's medication compliance and self-care since last session. CSW encouraged patient to schedule more therapy sessions for the future, as there is only one left on the schedule right now.  Throughout the session, CSW gave patient the opportunity to explore thoughts and feelings associated with current life situations and past/present external stressors.   CSW encouraged patient's expression of feelings and validated patient's thoughts using empathy, active listening, open body language, and unconditional positive regard.     Plan: Return again in 2 weeks. Next appointment:  10/3  Recommendations:  Return to therapy in 2 weeks, engage in self care behaviors, attend AA/NA meeting(s) in person or online, read about cognitive distortions & how to challenge them  Diagnosis:  Chronic post-traumatic stress disorder (PTSD)  Cocaine use disorder in remission  Alcohol use disorder in remission  Mild episode of recurrent major depressive disorder (HCC)  Collaboration of Care: Psychiatrist AEB - doctor can read therapy notes as needed  Patient/Guardian was advised Release of Information must be obtained prior to any record release in order to collaborate their care with an outside provider. Patient/Guardian was advised if they have not already done so to contact the registration department to sign all necessary forms in order for [place] to release  information regarding their care.   Consent: Patient/Guardian gives verbal consent for treatment and assignment of benefits for services provided during this visit. Patient/Guardian expressed understanding and agreed to proceed.   [name], LCSW 01/05/2023

## 2023-01-06 NOTE — Telephone Encounter (Signed)
Copied from CRM [id number]. Topic: General - Other >> Jan 06, 2023 12:08 PM Phill Myron wrote: Reason for CRM:  Ms. Tippens is calling because the document form for Dr Randa Evens office need to have a Medical Doctors signature. Please advise

## 2023-01-06 NOTE — Telephone Encounter (Signed)
Called & spoke to the patient. Verified name & DOB. Informed that a pre-op appointment is needed for clearance. Patient is unable to make appointment originally scheduled for 01/07/2023. Rescheduled appointment for 01/31/2023. Patient confirmed appointment.

## 2023-01-07 ENCOUNTER — Ambulatory Visit: Payer: 59 | Admitting: Family

## 2023-01-09 ENCOUNTER — Other Ambulatory Visit: Payer: Self-pay | Admitting: Internal Medicine

## 2023-01-10 NOTE — Telephone Encounter (Signed)
Requested Prescriptions  Pending Prescriptions Disp Refills   simvastatin (ZOCOR) 40 MG tablet [Pharmacy Med Name: SIMVASTATIN 40MG  TABLETS] 90 tablet 0    Sig: TAKE 1 TABLET(40 MG) BY MOUTH EVERY EVENING     Cardiovascular:  Antilipid - Statins Failed - 01/09/2023  7:55 AM      Failed - Lipid Panel in normal range within the last 12 months    Cholesterol, Total  Date Value Ref Range Status  08/22/2022 138 100 - 199 mg/dL Final   LDL Chol Calc (NIH)  Date Value Ref Range Status  08/22/2022 67 0 - 99 mg/dL Final   Direct LDL  Date Value Ref Range Status  05/29/2006 160 mg/dL    HDL  Date Value Ref Range Status  08/22/2022 54 >39 mg/dL Final   Triglycerides  Date Value Ref Range Status  08/22/2022 91 0 - 149 mg/dL Final         Passed - Patient is not pregnant      Passed - Valid encounter within last 12 months    Recent Outpatient Visits           1 month ago Unintentional weight loss   [facility] [place], [place], [place]   4 months ago Interstitial cystitis   [facility] Marcine Matar, MD   7 months ago Gallstones   [facility] [place], [place], [place]   8 months ago Grief reaction   [facility] [place], [place], [place]   1 year ago Muscle spasm of back   [facility] Marcine Matar, MD       Future Appointments             In 2 weeks Sharon Seller, [place] [facility]   In 3 weeks Laural Benes, Binnie Rail, MD [facility]   In 4 weeks Claiborne Rigg, NP [facility]

## 2023-01-19 ENCOUNTER — Ambulatory Visit (HOSPITAL_COMMUNITY): Payer: Medicare Other | Admitting: Clinical

## 2023-01-23 ENCOUNTER — Encounter (HOSPITAL_COMMUNITY): Payer: Self-pay | Admitting: Psychiatry

## 2023-01-23 ENCOUNTER — Telehealth (HOSPITAL_BASED_OUTPATIENT_CLINIC_OR_DEPARTMENT_OTHER): Payer: 59 | Admitting: Psychiatry

## 2023-01-23 VITALS — Wt 118.0 lb

## 2023-01-23 DIAGNOSIS — F4321 Adjustment disorder with depressed mood: Secondary | ICD-10-CM

## 2023-01-23 DIAGNOSIS — F1911 Other psychoactive substance abuse, in remission: Secondary | ICD-10-CM

## 2023-01-23 DIAGNOSIS — T1490XA Injury, unspecified, initial encounter: Secondary | ICD-10-CM

## 2023-01-23 DIAGNOSIS — Z62819 Personal history of unspecified abuse in childhood: Secondary | ICD-10-CM

## 2023-01-23 DIAGNOSIS — F4320 Adjustment disorder, unspecified: Secondary | ICD-10-CM

## 2023-01-23 DIAGNOSIS — F4312 Post-traumatic stress disorder, chronic: Secondary | ICD-10-CM | POA: Diagnosis not present

## 2023-01-23 MED ORDER — ARIPIPRAZOLE 2 MG PO TABS: 2.0000 mg | ORAL_TABLET | Freq: Every day | ORAL | 2 refills | Status: DC

## 2023-01-23 MED ORDER — TRAZODONE HCL 100 MG PO TABS: 100.0000 mg | ORAL_TABLET | Freq: Every evening | ORAL | 0 refills | Status: DC | PRN

## 2023-01-23 MED ORDER — SERTRALINE HCL 100 MG PO TABS: 100.0000 mg | ORAL_TABLET | Freq: Every day | ORAL | 0 refills | Status: DC
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
## 2023-01-25 ENCOUNTER — Ambulatory Visit: Payer: 59 | Attending: Physician Assistant | Admitting: Physician Assistant

## 2023-01-25 ENCOUNTER — Encounter: Payer: Self-pay | Admitting: Physician Assistant

## 2023-01-25 VITALS — BP 120/71 | HR 100 | Wt 118.6 lb

## 2023-01-25 DIAGNOSIS — D171 Benign lipomatous neoplasm of skin and subcutaneous tissue of trunk: Secondary | ICD-10-CM | POA: Diagnosis not present

## 2023-01-25 DIAGNOSIS — F1911 Other psychoactive substance abuse, in remission: Secondary | ICD-10-CM | POA: Diagnosis not present

## 2023-01-25 DIAGNOSIS — E782 Mixed hyperlipidemia: Secondary | ICD-10-CM

## 2023-01-25 DIAGNOSIS — K118 Other diseases of salivary glands: Secondary | ICD-10-CM

## 2023-01-25 MED ORDER — SIMVASTATIN 40 MG PO TABS: 40.0000 mg | ORAL_TABLET | Freq: Every day | ORAL | 1 refills | Status: DC
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
## 2023-01-31 ENCOUNTER — Other Ambulatory Visit: Payer: Self-pay

## 2023-01-31 ENCOUNTER — Ambulatory Visit: Payer: Medicare Other | Admitting: Internal Medicine

## 2023-01-31 ENCOUNTER — Ambulatory Visit: Payer: 59 | Attending: Internal Medicine | Admitting: Internal Medicine

## 2023-01-31 ENCOUNTER — Encounter: Payer: Self-pay | Admitting: Internal Medicine

## 2023-01-31 VITALS — BP 107/71 | HR 71 | Temp 98.8°F | Ht 65.0 in | Wt 121.8 lb

## 2023-01-31 DIAGNOSIS — M160 Bilateral primary osteoarthritis of hip: Secondary | ICD-10-CM | POA: Insufficient documentation

## 2023-01-31 DIAGNOSIS — J449 Chronic obstructive pulmonary disease, unspecified: Secondary | ICD-10-CM | POA: Diagnosis not present

## 2023-01-31 DIAGNOSIS — D7589 Other specified diseases of blood and blood-forming organs: Secondary | ICD-10-CM

## 2023-01-31 DIAGNOSIS — R918 Other nonspecific abnormal finding of lung field: Secondary | ICD-10-CM | POA: Diagnosis not present

## 2023-01-31 DIAGNOSIS — Z01818 Encounter for other preprocedural examination: Secondary | ICD-10-CM | POA: Diagnosis present

## 2023-01-31 DIAGNOSIS — F17200 Nicotine dependence, unspecified, uncomplicated: Secondary | ICD-10-CM

## 2023-01-31 DIAGNOSIS — G40909 Epilepsy, unspecified, not intractable, without status epilepticus: Secondary | ICD-10-CM

## 2023-01-31 DIAGNOSIS — G4733 Obstructive sleep apnea (adult) (pediatric): Secondary | ICD-10-CM | POA: Diagnosis not present

## 2023-01-31 DIAGNOSIS — M8580 Other specified disorders of bone density and structure, unspecified site: Secondary | ICD-10-CM | POA: Diagnosis not present

## 2023-01-31 DIAGNOSIS — R634 Abnormal weight loss: Secondary | ICD-10-CM | POA: Diagnosis not present

## 2023-01-31 DIAGNOSIS — Z122 Encounter for screening for malignant neoplasm of respiratory organs: Secondary | ICD-10-CM | POA: Diagnosis not present

## 2023-01-31 DIAGNOSIS — G2581 Restless legs syndrome: Secondary | ICD-10-CM | POA: Diagnosis not present

## 2023-01-31 DIAGNOSIS — F330 Major depressive disorder, recurrent, mild: Secondary | ICD-10-CM | POA: Diagnosis not present

## 2023-01-31 NOTE — Progress Notes (Signed)
Patient ID: [name], female    DOB: 1950/11/11  MRN: [id number]  CC: Pre-op Exam   Subjective: [name] is a 72 y.o. 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 Sherryll Burger   Patient will be having 9 dental extractions, alveoloplasty and removal of bilateral mandibular lingual tori by Dr. Ocie Doyne.  He requested preoperative evaluation prior to proceeding. Patient did not bring her medicines with her to this visit.  She had cholecystectomy done earlier this year.  She has also had hysterectomy.  Denies any issues waking up from anesthesia on prior surgeries. Patient reports that she does a lot of walking as she does not have a vehicle and can walk 2-3 blocks at a moderate pace without having to stop.  Denies any chest pains or significant shortness of breath with walking.  -History of seizure disorder:  On Lamictal.  No seizure in the past 6-9 ys  -COPD/tobacco dependence: She continues to smoke 1-1/2 packs a day.  Not ready to quit at this time.  Due for repeat CT of the chest for lung cancer screening and follow-up on lung nodules.  Currently on history inhaler which he uses consistently.  Uses albuterol inhaler at nights.  -History of OSA mild diagnosed in 2021.  Did not tolerate CPAP.  Also tried oral appliances but did not tolerate that either.  States she was told by her pulmonologist Dr. Maple Hudson that because her OSA is mild, does not require treatment at this time.  -Was dealing with issues of weight loss when I last saw her in June.  She attributed the weight loss to poor appetite from life stressors that she was experiencing at that time.  She was referred to behavioral health.  Started seeing Dr. Lolly Mustache and a  counselor.  Abilify was added to the Zoloft.  Patient reports she is doing much better and her appetite has picked up.  She has gained 6 pounds.  Most of her stressors have decreased significantly.  She has moved out of her previous apartment.  Thyroid studies were normal.  Alcohol use disorder/cocaine use disorder: Patient reports she has been clean of alcohol for 10 years and cocaine for over 4 years.  She is on oxycodone 15 mg 5 times a day through [facility] pain clinic for interstitial cystitis.   HM:  had flu shot last mth at [facility].  Plans to get Shingrix at her pharmacy Patient Active Problem List   Diagnosis Date Noted   S/P laparoscopic cholecystectomy 07/19/2022   Urinary urgency 09/17/2021   Pelvic floor dysfunction in female 09/06/2021   Insomnia 07/16/2021   COPD mixed type (HCC) 04/25/2020   Macular pucker, left eye 01/14/2020   Nuclear sclerotic cataract of both eyes 01/14/2020   Peripheral focal chorioretinal inflammation of both eyes 01/14/2020   Encounter for medication review and counseling 01/13/2020   OSA (obstructive sleep apnea) 12/25/2019   Vulvar lesion 07/17/2019   Laryngopharyngeal reflux (LPR) 04/03/2019   Hoarseness 03/25/2019   Status post total replacement of left hip 03/04/2019   Perennial allergic rhinitis 02/28/2019   Rhinitis medicamentosa 02/28/2019   Primary osteoarthritis of left hip 02/20/2019   Deviated septum 12/25/2018   Primary osteoarthritis of right hip 08/27/2018   Status post  right hip replacement 08/27/2018   Chronic sinusitis 06/10/2016   Positive urine drug screen 06/12/2014   Headache 11/18/2013   Seizure disorder (HCC) 11/18/2013   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   Cocaine abuse (HCC) 02/09/2013   Lethargy 02/09/2013   Anxiety 01/20/2012   History of difficult intubation 01/20/2012   Shoulder joint pain 09/23/2011   Subacromial or subdeltoid bursitis 09/23/2011   Increased frequency of urination 06/15/2011    Postoperative wound infection 05/14/2011   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   SEBORRHEIC KERATOSIS 04/03/2007   EPIDERMOID CYST, BACK 04/03/2007   BREAST TENDERNESS 02/09/2007   TOBACCO ABUSE 12/15/2006   ALLERGIC RHINITIS, SEASONAL 12/15/2006   GERD 12/15/2006   DIVERTICULOSIS, COLON 12/15/2006   DEGENERATIVE DISC DISEASE, LUMBOSACRAL SPINE 12/15/2006   ALCOHOL ABUSE, HX OF 12/15/2006   History of colonic polyps 12/15/2006   BARRETT'S ESOPHAGUS, HX OF 12/15/2006   HYPERLIPIDEMIA, MIXED 12/13/2006   Depression 12/13/2006   INTERSTITIAL CYSTITIS 12/13/2006   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 54 g 4   ARIPiprazole (ABILIFY) 2 MG tablet Take 1 tablet (2 mg total) by mouth daily. 30 tablet 2   azaTHIOprine (IMURAN) 50 MG tablet Take 150 mg by mouth daily.     bisacodyl (DULCOLAX PINK LAXATIVE) 5 MG EC tablet Take 5 mg by mouth daily as needed for moderate constipation. (Patient not taking: Reported on 01/25/2023)     Budeson-Glycopyrrol-Formoterol (BREZTRI AEROSPHERE) 160-9-4.8 MCG/ACT AERO Inhale 2 puffs into the lungs 2 (two) times daily. 10.7 g 12   Calcium Carbonate Antacid (TUMS ULTRA 1000 PO) Take 1,000 mg by mouth daily. With a meal     Cholecalciferol (VITAMIN D) 50 MCG (2000 UT) tablet Take 2,000 Units by mouth daily.     clonazePAM (KLONOPIN) 1 MG tablet Take 1 mg by mouth at bedtime. (Patient not taking: Reported on 01/25/2023)     cycloSPORINE (RESTASIS) 0.05 % ophthalmic emulsion Place 1 drop into both eyes 2 (two) times daily.     estradiol (ESTRACE) 2 MG tablet Take 1 tablet (2 mg total) by mouth daily. 30 tablet 6   lamoTRIgine (LAMICTAL) 100 MG tablet TAKE 1 TABLET BY MOUTH EVERY MORNING AND 2 TABLET EVERY EVENING 90 tablet 1    Multiple Vitamin (MULTIVITAMIN WITH MINERALS) TABS tablet Take 1 tablet by mouth daily after breakfast. Centrum Women's 50+     naloxone (NARCAN) nasal spray 4 mg/0.1 mL SMARTSIG:Both Nares     oxyCODONE (ROXICODONE) 15 MG immediate release tablet Take 15 mg by mouth 5 (five) times daily.     pantoprazole (PROTONIX) 40 MG tablet Take 40 mg by mouth daily.     prednisoLONE acetate (PRED FORTE) 1 % ophthalmic suspension Place 1 drop into both eyes in the morning, at noon, and at bedtime.     sertraline (ZOLOFT) 100 MG tablet Take 1 tablet (100 mg total) by mouth daily. 90 tablet 0   simvastatin (ZOCOR) 40 MG tablet Take 1 tablet (40 mg total) by mouth daily at 6 PM. 90 tablet 1   traZODone (DESYREL) 100 MG tablet Take 1 tablet (100 mg total) by mouth at bedtime as needed for sleep. 90 tablet 0   Current Facility-Administered Medications on File Prior to Visit  Medication Dose  Route Frequency Provider Last Rate Last Admin   Spy Agent Green / Firefly Optime  1.25 mg Intravenous Once Gaynelle Adu, MD        Allergies  Allergen Reactions   Haloperidol Decanoate Anaphylaxis    (At age 65 years old)   Oxybutynin Chloride Other (See Comments)    TROUBLE SWALLOWING   Tape     Adhesive tape (Tegaderm) --itching  The IV bandage     Social History   Socioeconomic History   Marital status: Divorced    Spouse name: Not on file   Number of children: 1   Years of education: college   Highest education level: Not on file  Occupational History   Occupation: disabled   Tobacco Use   Smoking status: Every Day    Current packs/day: 1.00    Average packs/day: 1 pack/day for 50.0 years (50.0 ttl pk-yrs)    Types: Cigarettes   Smokeless tobacco: Never   Tobacco comments:    Smoking 2ppd 08/01/2022 PAP  Vaping Use   Vaping status: Never Used  Substance and Sexual Activity   Alcohol use: Not Currently    Comment: 07-05-2022 hx alcohol abuse, quit 10 yrs ago   Drug use: Yes    Types: "Crack"  cocaine, Cocaine    Comment: 07-05-2022 quit 02/26/2019"   Sexual activity: Not Currently    Birth control/protection: Post-menopausal  Other Topics Concern   Not on file  Social History Narrative   Patient lives at home alone.   Caffeine Use: 1-2 cups daily   Social Determinants of Health   Financial Resource Strain: Low Risk  (01/25/2023)   Overall Financial Resource Strain (CARDIA)    Difficulty of Paying Living Expenses: Not hard at all  Food Insecurity: No Food Insecurity (01/25/2023)   Hunger Vital Sign    Worried About Running Out of Food in the Last Year: Never true    Ran Out of Food in the Last Year: Never true  Transportation Needs: No Transportation Needs (01/25/2023)   PRAPARE - Administrator, Civil Service (Medical): No    Lack of Transportation (Non-Medical): No  Physical Activity: Sufficiently Active (01/25/2023)   Exercise Vital Sign    Days of Exercise per Week: 6 days    Minutes of Exercise per Session: 60 min  Stress: No Stress Concern Present (01/25/2023)   [employer] of Occupational Health - Occupational Stress Questionnaire    Feeling of Stress : Only a little  Social Connections: Moderately Isolated (01/25/2023)   Social Connection and Isolation Panel [NHANES]    Frequency of Communication with Friends and Family: More than three times a week    Frequency of Social Gatherings with Friends and Family: Once a week    Attends Religious Services: Never    Database administrator or Organizations: Yes    Attends Banker Meetings: Never    Marital Status: Divorced  Catering manager Violence: Not At Risk (10/25/2022)   Humiliation, Afraid, Rape, and Kick questionnaire    Fear of Current or Ex-Partner: No    Emotionally Abused: No    Physically Abused: No    Sexually Abused: No    Family History  Adopted: Yes  Problem Relation Age of Onset   Breast cancer Neg Hx    Allergic rhinitis Neg Hx    Angioedema Neg Hx    Asthma Neg Hx     Atopy Neg Hx    Eczema Neg Hx  Immunodeficiency Neg Hx    Urticaria Neg Hx     Past Surgical History:  Procedure Laterality Date   CATARACT EXTRACTION W/ INTRAOCULAR LENS  IMPLANT, BILATERAL  2012   CHOLECYSTECTOMY N/A 07/19/2022   Procedure: LAPAROSCOPIC CHOLECYSTECTOMY; BILATERAL TAP BLOCK;  Surgeon: Gaynelle Adu, MD;  Location: [facility] OR[facility];  Service: General;  Laterality: N/A;   CYSTO W/ HYDRODISTENTION/  INSTILLATION THERAPY  multiiple since 2007;  last one 09-27-2013 @[facility]   ,  dr Marcelyn Bruins   ENDOSCOPIC RETROGRADE CHOLANGIOPANCREATOGRAPHY (ERCP) WITH PROPOFOL N/A 07/07/2022   Procedure: ENDOSCOPIC RETROGRADE CHOLANGIOPANCREATOGRAPHY (ERCP) WITH PROPOFOL;  Surgeon: Kerin Salen, MD;  Location: [facility] ENDOSCOPY;  Service: Gastroenterology;  Laterality: N/A;   INCISION AND DRAINAGE  05-14-2011  @[facility]    right shoulder   INTERSTIM IMPLANT REMOVAL  01-20-2012  dr Marcelyn Bruins @[facility]    previously placed 2009   MULTIPLE TOOTH EXTRACTIONS     NASAL SEPTUM SURGERY     x 2 occasions   NEUROPLASTY BRACHIAL PLEXUS  04-28-2011   @[facility]    REMOVAL OF STONES  07/07/2022   Procedure: REMOVAL OF STONES;  Surgeon: Kerin Salen, MD;  Location: [facility] ENDOSCOPY;  Service: Gastroenterology;;   SALPINGOOPHORECTOMY Bilateral 1990s   SINOSCOPY     SPHINCTEROTOMY  07/07/2022   Procedure: Dennison Mascot;  Surgeon: Kerin Salen, MD;  Location: [facility] ENDOSCOPY;  Service: Gastroenterology;;   TOTAL HIP ARTHROPLASTY Right 08/27/2018   Procedure: RIGHT TOTAL HIP ARTHROPLASTY ANTERIOR APPROACH;  Surgeon: Tarry Kos, MD;  Location: [facility];  Service: Orthopedics;  Laterality: Right;   TOTAL HIP ARTHROPLASTY Left 03/04/2019   Procedure: LEFT TOTAL HIP ARTHROPLASTY ANTERIOR APPROACH;  Surgeon: Tarry Kos, MD;  Location: [facility] OR;  Service: Orthopedics;  Laterality: Left;   VAGINAL HYSTERECTOMY  1990s    ROS: Review of Systems Negative except as stated above  PHYSICAL EXAM: BP 107/71   Pulse 71   Temp 98.8 F (37.1 C)    Ht 5\' 5"  (1.651 m)   Wt 121 lb 12.8 oz (55.2 kg)   SpO2 96%   BMI 20.27 kg/m   Wt Readings from Last 3 Encounters:  01/31/23 121 lb 12.8 oz (55.2 kg)  01/25/23 118 lb 9.6 oz (53.8 kg)  11/30/22 115 lb (52.2 kg)    Physical Exam  General appearance -older Caucasian female in NAD the Mental status -patient is alert and very talkative. Eyes - pupils equal and reactive, extraocular eye movements intact Mouth - mucous membranes moist, pharynx normal without lesions Neck - supple, no significant adenopathy Chest - clear to auscultation, no wheezes, rales or rhonchi, symmetric air entry Heart - normal rate, regular rhythm, normal S1, S2, no murmurs, rubs, clicks or gallops Extremities - peripheral pulses normal, no pedal edema, no clubbing or cyanosis  EKG revealed sinus bradycardia with rate of 59.  EKG is otherwise normal.    Latest Ref Rng & Units 07/19/2022    1:48 PM 07/05/2022    2:09 PM 05/23/2022   11:06 AM  CMP  Glucose 70 - 99 mg/dL 147  95    BUN 8 - 23 mg/dL 14  13    Creatinine 8.29 - 1.00 mg/dL 5.62  1.30    Sodium 865 - 145 mmol/L 138  139    Potassium 3.5 - 5.1 mmol/L 4.1  4.0    Chloride 98 - 111 mmol/L 107  109    CO2 22 - 32 mmol/L 24  24    Calcium 8.9 - 10.3 mg/dL 8.2  8.6  Total Protein 6.5 - 8.1 g/dL 6.3  6.6  6.5   Total Bilirubin 0.3 - 1.2 mg/dL 0.5  0.7  0.5   Alkaline Phos 38 - 126 U/L 43  53  71   AST 15 - 41 U/L 24  14  7    ALT 0 - 44 U/L 15  11  5     Lipid Panel     Component Value Date/Time   CHOL 138 08/22/2022 1439   TRIG 91 08/22/2022 1439   HDL 54 08/22/2022 1439   CHOLHDL 2.6 08/22/2022 1439   CHOLHDL 2.6 Ratio 01/12/2007 2140   VLDL 31 01/12/2007 2140   LDLCALC 67 08/22/2022 1439   LDLDIRECT 160 05/29/2006 0000    CBC    Component Value Date/Time   WBC 3.9 (L) 07/19/2022 1315   RBC 3.45 (L) 07/19/2022 1315   HGB 13.4 07/19/2022 1315   HGB 13.6 05/23/2022 1106   HCT 38.3 07/19/2022 1315   HCT 38.2 05/23/2022 1106   PLT 161  07/19/2022 1315   PLT 293 05/23/2022 1106   MCV 111.0 (H) 07/19/2022 1315   MCV 103 (H) 05/23/2022 1106   MCH 38.8 (H) 07/19/2022 1315   MCHC 35.0 07/19/2022 1315   RDW 15.8 (H) 07/19/2022 1315   RDW 14.4 05/23/2022 1106   LYMPHSABS 1.1 05/23/2022 1106   MONOABS 0.5 05/04/2022 1434   EOSABS 0.1 05/23/2022 1106   BASOSABS 0.1 05/23/2022 1106    ASSESSMENT AND PLAN: 1. Pre-op evaluation Patient is medically stable and optimized for her dental procedure. - Comprehensive metabolic panel - CBC  2. Chronic obstructive pulmonary disease, unspecified COPD type (HCC) Stable on current inhalers. - Comprehensive metabolic panel - CBC  3. OSA (obstructive sleep apnea) Patient has mild sleep apnea.  Not on CPAP or oral appliances.  Anesthesiologist must be aware of this diagnosis in the perioperative period.  4. Tobacco dependence Strongly advised to quit.  Patient is not willing to give a trial of quitting at this time.  5. Unintentional weight loss She is starting to regain her weight slowly given decreased stressors.  Oral intake has improved. Up-to-date with age-appropriate cancer screenings.  She will be due for lung cancer screening with repeat CT of the chest next month.  Order placed.  6. Seizure disorder (HCC) Stable with no seizures in over 6 years.  7. Major depressive disorder, recurrent episode, mild (HCC) Improved.  Plugged in with behavioral health  8. Screening for lung cancer - CT CHEST NODULE FOLLOW UP LOW DOSE W/O; Future  9. Lung nodules - CT CHEST NODULE FOLLOW UP LOW DOSE W/O; Future  Addendum: Patient with macrocytosis.  Will have the lab add vitamin B12 and folate level. Patient was given the opportunity to ask questions.  Patient verbalized understanding of the plan and was able to repeat key elements of the plan.   This documentation was completed using Paediatric nurse.  Any transcriptional errors are unintentional.  No orders of  the defined types were placed in this encounter.    Requested Prescriptions    No prescriptions requested or ordered in this encounter    No follow-ups on file.  Jonah Blue, MD, FACP

## 2023-02-01 LAB — COMPREHENSIVE METABOLIC PANEL WITH GFR
ALT: 12 [IU]/L (ref 0–32)
AST: 15 [IU]/L (ref 0–40)
Albumin: 4.3 g/dL (ref 3.9–4.9)
Alkaline Phosphatase: 80 [IU]/L (ref 44–121)
BUN/Creatinine Ratio: 14 (ref 12–28)
BUN: 13 mg/dL (ref 8–27)
Bilirubin Total: 0.3 mg/dL (ref 0.0–1.2)
CO2: 23 mmol/L (ref 20–29)
Calcium: 9 mg/dL (ref 8.7–10.3)
Chloride: 104 mmol/L (ref 96–106)
Creatinine, Ser: 0.91 mg/dL (ref 0.57–1.00)
Globulin, Total: 2.2 g/dL (ref 1.5–4.5)
Glucose: 93 mg/dL (ref 70–99)
Potassium: 4.8 mmol/L (ref 3.5–5.2)
Sodium: 142 mmol/L (ref 134–144)
Total Protein: 6.5 g/dL (ref 6.0–8.5)
eGFR: 68 mL/min/{1.73_m2}

## 2023-02-01 LAB — CBC
Hematocrit: 44.3 % (ref 34.0–46.6)
Hemoglobin: 15.3 g/dL (ref 11.1–15.9)
MCH: 36.3 pg — ABNORMAL HIGH (ref 26.6–33.0)
MCHC: 34.5 g/dL (ref 31.5–35.7)
MCV: 105 fL — ABNORMAL HIGH (ref 79–97)
Platelets: 216 10*3/uL (ref 150–450)
RBC: 4.21 x10E6/uL (ref 3.77–5.28)
RDW: 11.8 % (ref 11.7–15.4)
WBC: 5.9 10*3/uL (ref 3.4–10.8)

## 2023-02-01 NOTE — Addendum Note (Signed)
Addended by: Jonah Blue B on: 02/01/2023 09:53 AM   Modules accepted: Orders

## 2023-02-01 NOTE — Telephone Encounter (Signed)
Pre-op form and office visit notes successfully faxed to Mady Haagensen. office (Dentist) on 02/01/2023.

## 2023-02-06 ENCOUNTER — Encounter (INDEPENDENT_AMBULATORY_CARE_PROVIDER_SITE_OTHER): Payer: Self-pay | Admitting: Otolaryngology

## 2023-02-06 ENCOUNTER — Telehealth: Payer: Self-pay

## 2023-02-06 NOTE — Telephone Encounter (Signed)
Called & spoke to the patient. Verified name & DOB. Informed that pre-op form has been successfully faxed to Dr.Jensen's office on 02/03/2023. Patient is inquiring about folate and vitamin B12 level results. Informed patient we are awaiting results.

## 2023-02-06 NOTE — Telephone Encounter (Signed)
Called & spoke to the patient. Verified name & DOB. Informed that the lab was unable to use previous blood sample. Scheduled lab appointment for 02/14/23. Patient confirmed appointment.

## 2023-02-06 NOTE — Telephone Encounter (Signed)
Copied from CRM [id number]. Topic: General - Inquiry >> Feb 06, 2023 10:29 AM Marlow Baars wrote: Reason for CRM: The patient called in inquiring to see if her pre op dental extraction surgical clearance notes have been sent to Dr Barbette Merino. Please assist patient further

## 2023-02-07 ENCOUNTER — Ambulatory Visit: Payer: 59 | Admitting: Nurse Practitioner

## 2023-02-13 ENCOUNTER — Ambulatory Visit
Admission: RE | Admit: 2023-02-13 | Discharge: 2023-02-13 | Disposition: A | Discharge status: Home / Self Care | Payer: 59 | Source: Ambulatory Visit | Attending: Internal Medicine | Admitting: Internal Medicine

## 2023-02-13 DIAGNOSIS — R918 Other nonspecific abnormal finding of lung field: Secondary | ICD-10-CM

## 2023-02-13 DIAGNOSIS — Z122 Encounter for screening for malignant neoplasm of respiratory organs: Secondary | ICD-10-CM

## 2023-02-14 ENCOUNTER — Other Ambulatory Visit: Payer: 59

## 2023-02-16 ENCOUNTER — Other Ambulatory Visit: Payer: 59

## 2023-02-17 ENCOUNTER — Encounter: Payer: Self-pay | Admitting: Internal Medicine

## 2023-02-17 ENCOUNTER — Telehealth: Payer: Self-pay | Admitting: Internal Medicine

## 2023-02-17 NOTE — Telephone Encounter (Signed)
Patient has called in regards to a CT Scan that she had done on Monday, 10.28.2024, and patient expressed urgency for a call back about the results of this scan @ # [phone].

## 2023-02-17 NOTE — Telephone Encounter (Signed)
Will forward to nurse 

## 2023-02-19 NOTE — Telephone Encounter (Signed)
I sent patient a MyChart message letting her know that the results of her CAT scan is not back as yet.  Will be in touch once I get the results.

## 2023-02-20 ENCOUNTER — Telehealth: Payer: Self-pay | Admitting: Internal Medicine

## 2023-02-20 NOTE — Telephone Encounter (Signed)
Copied from CRM [id number]. Topic: General - Other >> Feb 20, 2023  3:49 PM Macon Large wrote: Reason for CRM: Pt requests that Dr. Laural Benes be made aware that she will be having oral surgery on 02/28/23.

## 2023-02-21 ENCOUNTER — Ambulatory Visit: Payer: Self-pay | Admitting: *Deleted

## 2023-02-21 NOTE — Telephone Encounter (Signed)
noted 

## 2023-02-21 NOTE — Telephone Encounter (Signed)
Oral surgery- scheduled for Friday- 9 teeth pulled- patient has appointment 02/27/23 for  lab work.  Reason for Disposition  [1] Follow-up call to recent contact AND [2] information only call, no triage required  Answer Assessment - Initial Assessment Questions 1. REASON FOR CALL or QUESTION: "What is your reason for calling today?" or "How can I best help you?" or "What question do you have that I can help answer?"     Patient is calling for reassurance regarding her CT scan- patient advised PCP is aware of her concerns and she will call her as soon as she gets results. Patient is aware and also discussed with increased amount of testing with new recommendations for testing/screening may be reason for long wait times.  Patient has canceled labs- he oral surgery has been moved up to Friday and she may not feel ike coming to lab on Monday after having 9 teeth pulled. She will call back to schedule. Patient is in better spirits at end of call- wished her best of luck with up coming surgery. PCP will call her with results as soon as she gets them.  Protocols used: Information Only Call - No Triage-A-AH

## 2023-02-21 NOTE — Telephone Encounter (Addendum)
  Chief Complaint: Patient is anxious about her results- discussed reason they may be delayed- may have nothing to do with her vs volume of testing now.   Disposition: [] ED /[] Urgent Care (no appt availability in office) / [] Appointment(In office/virtual)/ []  [place] [facility]/ [] Home Care/ [] Refused Recommended Disposition /[] [place] [facility]/ [x]  Follow-up with PCP Additional Notes: Patient calmer about the wait. She has upcoming oral surgery which may be playing into her nerves.

## 2023-02-21 NOTE — Telephone Encounter (Signed)
Pt stated oral surgery was rescheduled again November 8th.   FYI to PCP.  Please advise.

## 2023-02-21 NOTE — H&P (Signed)
  Patient: [name]  PID: [id number]  DOB: 16-Nov-1950  SEX: Female   Patient referred by DDS for extraction all remaining teeth and TMJ eval.  CC: No pain. Has pain when she clenches her jaw. No clicking, popping, or locking.  Past Medical History:  Sleep Apnea, Sinus Issues, Smoker, Osteopenia, Stomach Ulcers, Drug Abuse/Alcohol Abuse, Eye Disease or Glaucoma, Mental Health problems, Hyperlipidemia, Interstitial Cystitis, COPD, Depression    Medications: Oxycodone, Simvastatin, Zoloft, Abilify, Estrogen, Lamictal    Allergies:     Haldol    Surgeries:   hip replacement surgery, gallbladder removal, toe surgery     Social History       Smoking: 1.5 ppd            Alcohol: Drug use:                             Exam: TMJ non-tender to touch. MIO within normal limits. No clicking or popping. BMI 18.5. Teeth # 6, 7 only maxillary remaining teeth. Mandibular teeth # 21, 22, 27, 28, 29, 30, 31 only mandibular teeth. Bilateral mandibular lingual tori.  No purulence, edema, fluctuance, trismus. Oral cancer screening negative. Pharynx clear. No lymphadenopathy.  Panorex: Remaining teeth # 6, 7, 21, 22, 27, 28, 29, 30, 31.  Slight condylar head flattening.   Assessment: ASA 2. Non-restorable teeth # 6, 7, 21, 22, 27, 28, 29, 30, 31. Bilateral mandibular lingual tori. TMJ Clenching. Expect TMJ may improve with U/L dentures to restore occlusion.               Plan: 1. MD Clearance  2. Extraction Teeth # 6, 7, 21, 22, 27, 28, 29, 30, 31. Alveoloplasty. Remove bilateral mandibular lingual tori.   Hospital Day surgery.                 Rx: n               Risks and complications explained. Questions answered.   Georgia Lopes, DMD

## 2023-02-22 ENCOUNTER — Other Ambulatory Visit: Payer: 59

## 2023-02-23 ENCOUNTER — Other Ambulatory Visit: Payer: Self-pay

## 2023-02-23 ENCOUNTER — Encounter (HOSPITAL_COMMUNITY): Payer: Self-pay | Admitting: Oral Surgery

## 2023-02-23 NOTE — Anesthesia Preprocedure Evaluation (Addendum)
Anesthesia Evaluation  Patient identified by MRN, date of birth, ID band Patient awake    Reviewed: Allergy & Precautions, NPO status , Patient's Chart, lab work & pertinent test results  History of Anesthesia Complications (+) DIFFICULT AIRWAY and history of anesthetic complications (history of GS intubation 2/2 anterior larynx)  Airway Mallampati: I  TM Distance: >3 FB Neck ROM: Full    Dental  (+) Poor Dentition, Missing, Loose,    Pulmonary sleep apnea , COPD, Current Smoker   Pulmonary exam normal        Cardiovascular negative cardio ROS  Rhythm:Regular Rate:Normal     Neuro/Psych  Headaches, Seizures -,   Anxiety Depression       GI/Hepatic Neg liver ROS,GERD  Medicated,,  Endo/Other  negative endocrine ROS    Renal/GU negative Renal ROS  negative genitourinary   Musculoskeletal  (+) Arthritis , Osteoarthritis,    Abdominal Normal abdominal exam  (+)   Peds  Hematology negative hematology ROS (+)   Anesthesia Other Findings Dental caries  Reproductive/Obstetrics                             Anesthesia Physical Anesthesia Plan  ASA: 3  Anesthesia Plan: General   Post-op Pain Management: Celebrex PO (pre-op)* and Tylenol PO (pre-op)*   Induction: Intravenous  PONV Risk Score and Plan: 2 and Ondansetron, Dexamethasone and Treatment may vary due to age or medical condition  Airway Management Planned: Mask, Video Laryngoscope Planned and Nasal ETT  Additional Equipment: None  Intra-op Plan:   Post-operative Plan: Extubation in OR  Informed Consent: I have reviewed the patients History and Physical, chart, labs and discussed the procedure including the risks, benefits and alternatives for the proposed anesthesia with the patient or authorized representative who has indicated his/her understanding and acceptance.     Dental advisory given  Plan Discussed with:  CRNA  Anesthesia Plan Comments: (PAT note by Antionette Poles, PA-C: 72 year old female with pertinent history including COPD (maintained on Breztri and as needed albuterol), OSA not on CPAP, current smoker, polysubstance abuse in remission, seizure disorder (stable on Lamictal, no seizures in >6 years), major depressive disorder.  Seen by PCP Dr. Jonah Blue on 01/31/2023 for preop evaluation.  Per note, "patient is medically stable and optimized for her dental procedure."  CMP and CBC done at that time were unremarkable.Marland Kitchen  History of difficult to patient due to anterior larynx.  GlideScope was used electively for intubation on 07/19/2022.   EKG 01/31/2023: Sinus bradycardia.  Rate 59.  CT cardiac scoring 06/12/2020: IMPRESSION: 1. Coronary calcium score of 0.  2. Dilated main pulmonary artery measures 40mm  TTE 04/06/2020: 1. Left ventricular ejection fraction, by estimation, is 60 to 65%. The  left ventricle has normal function. The left ventricle has no regional  wall motion abnormalities. Left ventricular diastolic parameters are  consistent with Grade I diastolic  dysfunction (impaired relaxation). Elevated left ventricular end-diastolic  pressure.  2. Right ventricular systolic function is normal. The right ventricular  size is normal. There is mildly elevated pulmonary artery systolic  pressure.  3. The mitral valve is normal in structure. Trivial mitral valve  regurgitation. No evidence of mitral stenosis.  4. The aortic valve is tricuspid. Aortic valve regurgitation is not  visualized. No aortic stenosis is present.  5. The inferior vena cava is normal in size with greater than 50%  respiratory variability, suggesting right atrial pressure of 3 mmHg.    )  Anesthesia Quick Evaluation

## 2023-02-23 NOTE — Progress Notes (Signed)
Anesthesia Chart Review: Same-day workup  72 year old female with pertinent history including COPD (maintained on Breztri and as needed albuterol), OSA not on CPAP, current smoker, polysubstance abuse in remission, seizure disorder (stable on Lamictal, no seizures in >6 years), major depressive disorder.  Seen by PCP Dr. Jonah Blue on 01/31/2023 for preop evaluation.  Per note, "patient is medically stable and optimized for her dental procedure."  CMP and CBC done at that time were unremarkable.Marland Kitchen  History of difficult to patient due to anterior larynx.  GlideScope was used electively for intubation on 07/19/2022.   EKG 01/31/2023: Sinus bradycardia.  Rate 59.  CT cardiac scoring 06/12/2020: IMPRESSION: 1. Coronary calcium score of 0.   2. Dilated main pulmonary artery measures 40mm  TTE 04/06/2020:  1. Left ventricular ejection fraction, by estimation, is 60 to 65%. The  left ventricle has normal function. The left ventricle has no regional  wall motion abnormalities. Left ventricular diastolic parameters are  consistent with Grade I diastolic  dysfunction (impaired relaxation). Elevated left ventricular end-diastolic  pressure.   2. Right ventricular systolic function is normal. The right ventricular  size is normal. There is mildly elevated pulmonary artery systolic  pressure.   3. The mitral valve is normal in structure. Trivial mitral valve  regurgitation. No evidence of mitral stenosis.   4. The aortic valve is tricuspid. Aortic valve regurgitation is not  visualized. No aortic stenosis is present.   5. The inferior vena cava is normal in size with greater than 50%  respiratory variability, suggesting right atrial pressure of 3 mmHg.     [name] [facility] Short Stay Center/Anesthesiology Phone [phone] 02/23/2023 11:01 AM

## 2023-02-23 NOTE — Progress Notes (Signed)
Medical Clearance has been placed on patient's chart along with H & P.  PCP - Dr Jonah Blue   Pain Mgmt at [facility] - Courtney Paris, FNP Cardiologist - none Neurology - Dr Joycelyn Schmid Pulmonology - Dr Jetty Duhamel  CT Chest x-ray - 02/13/23 EKG - 01/31/23 Stress Test - n/a ECHO - 04/06/20 Cardiac Cath - n/a  ICD Pacemaker/Loop - n/a  Sleep Study -  Yes, 12/21/20 (mild to moderate) CPAP - does not use CPAP or Oral appliance.  Diabetes - n/a  NPO  Anesthesia review: Yes  STOP now taking any Aspirin (unless otherwise instructed by your surgeon), Aleve, Naproxen, Ibuprofen, Motrin, Mobic, Advil, Goody's, BC's, all herbal medications, fish oil, and all vitamins.   Coronavirus Screening Do you have any of the following symptoms:  Cough Yes, "smokers cough" Fever (>100.43F)  yes/no: No Runny nose yes/no: No Sore throat yes/no: No Difficulty breathing/shortness of breath  Yes with exertion  Have you traveled in the last 14 days and where? yes/no: No  Patient verbalized understanding of instructions that were given via phone.

## 2023-02-24 ENCOUNTER — Other Ambulatory Visit: Payer: Self-pay

## 2023-02-24 ENCOUNTER — Encounter (HOSPITAL_COMMUNITY)
Admission: RE | Disposition: A | Discharge status: Home / Self Care | Payer: Self-pay | Source: Home / Self Care | Attending: Oral Surgery

## 2023-02-24 ENCOUNTER — Ambulatory Visit (HOSPITAL_COMMUNITY)
Admission: RE | Admit: 2023-02-24 | Discharge: 2023-02-24 | Disposition: A | Discharge status: Home / Self Care | Payer: 59 | Attending: Oral Surgery | Admitting: Oral Surgery

## 2023-02-24 ENCOUNTER — Encounter (HOSPITAL_COMMUNITY): Payer: Self-pay | Admitting: Oral Surgery

## 2023-02-24 ENCOUNTER — Ambulatory Visit (HOSPITAL_COMMUNITY): Payer: 59 | Admitting: Physician Assistant

## 2023-02-24 ENCOUNTER — Ambulatory Visit (HOSPITAL_BASED_OUTPATIENT_CLINIC_OR_DEPARTMENT_OTHER): Payer: 59 | Admitting: Physician Assistant

## 2023-02-24 DIAGNOSIS — F17210 Nicotine dependence, cigarettes, uncomplicated: Secondary | ICD-10-CM | POA: Insufficient documentation

## 2023-02-24 DIAGNOSIS — K029 Dental caries, unspecified: Secondary | ICD-10-CM

## 2023-02-24 DIAGNOSIS — J449 Chronic obstructive pulmonary disease, unspecified: Secondary | ICD-10-CM | POA: Insufficient documentation

## 2023-02-24 DIAGNOSIS — M270 Developmental disorders of jaws: Secondary | ICD-10-CM | POA: Diagnosis not present

## 2023-02-24 DIAGNOSIS — F329 Major depressive disorder, single episode, unspecified: Secondary | ICD-10-CM | POA: Diagnosis not present

## 2023-02-24 DIAGNOSIS — G40909 Epilepsy, unspecified, not intractable, without status epilepticus: Secondary | ICD-10-CM | POA: Insufficient documentation

## 2023-02-24 DIAGNOSIS — K056 Periodontal disease, unspecified: Secondary | ICD-10-CM | POA: Diagnosis present

## 2023-02-24 DIAGNOSIS — F1911 Other psychoactive substance abuse, in remission: Secondary | ICD-10-CM | POA: Insufficient documentation

## 2023-02-24 DIAGNOSIS — Z79899 Other long term (current) drug therapy: Secondary | ICD-10-CM | POA: Diagnosis not present

## 2023-02-24 DIAGNOSIS — G4730 Sleep apnea, unspecified: Secondary | ICD-10-CM | POA: Diagnosis not present

## 2023-02-24 DIAGNOSIS — M1990 Unspecified osteoarthritis, unspecified site: Secondary | ICD-10-CM | POA: Diagnosis not present

## 2023-02-24 DIAGNOSIS — F418 Other specified anxiety disorders: Secondary | ICD-10-CM | POA: Insufficient documentation

## 2023-02-24 HISTORY — PX: TOOTH EXTRACTION: SHX859

## 2023-02-24 SURGERY — DENTAL RESTORATION/EXTRACTIONS
Anesthesia: General

## 2023-02-24 MED ORDER — AMISULPRIDE (ANTIEMETIC) 5 MG/2ML IV SOLN: 10.0000 mg | Freq: Once | INTRAVENOUS | Status: DC | PRN

## 2023-02-24 MED ORDER — CHLORHEXIDINE GLUCONATE 0.12 % MT SOLN
15.0000 mL | Freq: Once | OROMUCOSAL | Status: AC
  Administered 2023-02-24: 15 mL via OROMUCOSAL

## 2023-02-24 MED ORDER — OXYCODONE HCL 5 MG PO TABS: 5.0000 mg | ORAL_TABLET | ORAL | 0 refills | Status: AC | PRN

## 2023-02-24 MED ORDER — PROPOFOL 10 MG/ML IV BOLUS
INTRAVENOUS | Status: DC | PRN
  Administered 2023-02-24: 120 mg via INTRAVENOUS

## 2023-02-24 MED ORDER — FENTANYL CITRATE (PF) 100 MCG/2ML IJ SOLN
INTRAMUSCULAR | Status: AC
  Filled 2023-02-24: qty 2

## 2023-02-24 MED ORDER — ONDANSETRON HCL 4 MG/2ML IJ SOLN
INTRAMUSCULAR | Status: DC | PRN
  Administered 2023-02-24: 4 mg via INTRAVENOUS

## 2023-02-24 MED ORDER — SODIUM CHLORIDE 0.9 % IR SOLN
Status: DC | PRN
  Administered 2023-02-24: 1000 mL

## 2023-02-24 MED ORDER — PROPOFOL 10 MG/ML IV BOLUS
INTRAVENOUS | Status: AC
  Filled 2023-02-24: qty 20

## 2023-02-24 MED ORDER — 0.9 % SODIUM CHLORIDE (POUR BTL) OPTIME
TOPICAL | Status: DC | PRN
  Administered 2023-02-24: 1000 mL

## 2023-02-24 MED ORDER — ORAL CARE MOUTH RINSE: 15.0000 mL | Freq: Once | OROMUCOSAL | Status: AC

## 2023-02-24 MED ORDER — DEXMEDETOMIDINE HCL IN NACL 80 MCG/20ML IV SOLN
INTRAVENOUS | Status: DC | PRN
  Administered 2023-02-24: 12 ug via INTRAVENOUS

## 2023-02-24 MED ORDER — OXYCODONE HCL 5 MG PO TABS: 5.0000 mg | ORAL_TABLET | Freq: Once | ORAL | Status: AC | PRN

## 2023-02-24 MED ORDER — DEXAMETHASONE SODIUM PHOSPHATE 10 MG/ML IJ SOLN
INTRAMUSCULAR | Status: DC | PRN
  Administered 2023-02-24: 10 mg via INTRAVENOUS

## 2023-02-24 MED ORDER — EPHEDRINE SULFATE-NACL 50-0.9 MG/10ML-% IV SOSY
PREFILLED_SYRINGE | INTRAVENOUS | Status: DC | PRN
  Administered 2023-02-24: 5 mg via INTRAVENOUS

## 2023-02-24 MED ORDER — GLYCOPYRROLATE 0.2 MG/ML IJ SOLN
INTRAMUSCULAR | Status: DC | PRN
  Administered 2023-02-24: .1 mg via INTRAVENOUS

## 2023-02-24 MED ORDER — OXYCODONE HCL 5 MG/5ML PO SOLN
5.0000 mg | Freq: Once | ORAL | Status: AC | PRN
  Administered 2023-02-24: 5 mg via ORAL

## 2023-02-24 MED ORDER — OXYCODONE HCL 5 MG/5ML PO SOLN
ORAL | Status: AC
  Filled 2023-02-24: qty 5

## 2023-02-24 MED ORDER — LIDOCAINE 2% (20 MG/ML) 5 ML SYRINGE
INTRAMUSCULAR | Status: DC | PRN
  Administered 2023-02-24: 60 mg via INTRAVENOUS
  Administered 2023-02-24: 30 mg via INTRAVENOUS

## 2023-02-24 MED ORDER — LIDOCAINE-EPINEPHRINE 2 %-1:100000 IJ SOLN
INTRAMUSCULAR | Status: AC
  Filled 2023-02-24: qty 1

## 2023-02-24 MED ORDER — ACETAMINOPHEN 500 MG PO TABS
1000.0000 mg | ORAL_TABLET | Freq: Once | ORAL | Status: AC
  Administered 2023-02-24: 1000 mg via ORAL
  Filled 2023-02-24: qty 2

## 2023-02-24 MED ORDER — SODIUM CHLORIDE 0.9 % IV SOLN: INTRAVENOUS | Status: DC | PRN

## 2023-02-24 MED ORDER — AMOXICILLIN 500 MG PO CAPS: 500.0000 mg | ORAL_CAPSULE | Freq: Three times a day (TID) | ORAL | 0 refills | Status: DC

## 2023-02-24 MED ORDER — FENTANYL CITRATE (PF) 250 MCG/5ML IJ SOLN
INTRAMUSCULAR | Status: DC | PRN
  Administered 2023-02-24 (×2): 50 ug via INTRAVENOUS

## 2023-02-24 MED ORDER — ROCURONIUM BROMIDE 10 MG/ML (PF) SYRINGE
PREFILLED_SYRINGE | INTRAVENOUS | Status: DC | PRN
  Administered 2023-02-24: 40 mg via INTRAVENOUS

## 2023-02-24 MED ORDER — CELECOXIB 200 MG PO CAPS
200.0000 mg | ORAL_CAPSULE | Freq: Once | ORAL | Status: AC
  Administered 2023-02-24: 200 mg via ORAL
  Filled 2023-02-24: qty 1

## 2023-02-24 MED ORDER — CEFAZOLIN SODIUM-DEXTROSE 2-4 GM/100ML-% IV SOLN
2.0000 g | INTRAVENOUS | Status: AC
  Administered 2023-02-24: 2 g via INTRAVENOUS
  Filled 2023-02-24: qty 100

## 2023-02-24 MED ORDER — ESMOLOL HCL 100 MG/10ML IV SOLN
INTRAVENOUS | Status: DC | PRN
  Administered 2023-02-24: 30 mg via INTRAVENOUS

## 2023-02-24 MED ORDER — SUGAMMADEX SODIUM 200 MG/2ML IV SOLN
INTRAVENOUS | Status: DC | PRN
  Administered 2023-02-24: 200 mg via INTRAVENOUS

## 2023-02-24 MED ORDER — CHLORHEXIDINE GLUCONATE 0.12 % MT SOLN
OROMUCOSAL | Status: AC
  Filled 2023-02-24: qty 15

## 2023-02-24 MED ORDER — LIDOCAINE-EPINEPHRINE 2 %-1:100000 IJ SOLN
INTRAMUSCULAR | Status: DC | PRN
  Administered 2023-02-24: 20 mL via INTRADERMAL

## 2023-02-24 MED ORDER — FENTANYL CITRATE (PF) 100 MCG/2ML IJ SOLN
25.0000 ug | INTRAMUSCULAR | Status: DC | PRN
  Administered 2023-02-24: 25 ug via INTRAVENOUS

## 2023-02-24 SURGICAL SUPPLY — 36 items
BAG COUNTER SPONGE SURGICOUNT (BAG) IMPLANT
BAG SPNG CNTER NS LX DISP (BAG)
BLADE SURG 15 STRL LF DISP TIS (BLADE) ×2 IMPLANT
BLADE SURG 15 STRL SS (BLADE) ×1
BUR CROSS CUT FISSURE 1.6 (BURR) ×2 IMPLANT
BUR EGG ELITE 4.0 (BURR) ×2 IMPLANT
CANISTER SUCT 3000ML PPV (MISCELLANEOUS) ×2 IMPLANT
COVER SURGICAL LIGHT HANDLE (MISCELLANEOUS) ×2 IMPLANT
GAUZE PACKING FOLDED 2 STR (GAUZE/BANDAGES/DRESSINGS) ×2 IMPLANT
GLOVE BIO SURGEON STRL SZ 6.5 (GLOVE) IMPLANT
GLOVE BIO SURGEON STRL SZ7 (GLOVE) IMPLANT
GLOVE BIO SURGEON STRL SZ8 (GLOVE) ×2 IMPLANT
GLOVE BIOGEL PI IND STRL 6.5 (GLOVE) IMPLANT
GLOVE BIOGEL PI IND STRL 7.0 (GLOVE) IMPLANT
GOWN STRL REUS W/ TWL LRG LVL3 (GOWN DISPOSABLE) ×2 IMPLANT
GOWN STRL REUS W/ TWL XL LVL3 (GOWN DISPOSABLE) ×2 IMPLANT
GOWN STRL REUS W/TWL LRG LVL3 (GOWN DISPOSABLE) ×1
GOWN STRL REUS W/TWL XL LVL3 (GOWN DISPOSABLE) ×1
IV NS 1000ML (IV SOLUTION) ×1
IV NS 1000ML BAXH (IV SOLUTION) ×2 IMPLANT
KIT BASIN OR (CUSTOM PROCEDURE TRAY) ×2 IMPLANT
KIT TURNOVER KIT B (KITS) ×2 IMPLANT
NDL HYPO 25GX1X1/2 BEV (NEEDLE) ×4 IMPLANT
NEEDLE HYPO 25GX1X1/2 BEV (NEEDLE) ×2
NS IRRIG 1000ML POUR BTL (IV SOLUTION) ×2 IMPLANT
PAD ARMBOARD 7.5X6 YLW CONV (MISCELLANEOUS) ×2 IMPLANT
SLEEVE IRRIGATION ELITE 7 (MISCELLANEOUS) ×2 IMPLANT
SPIKE FLUID TRANSFER (MISCELLANEOUS) ×2 IMPLANT
SPONGE SURGIFOAM ABS GEL 12-7 (HEMOSTASIS) IMPLANT
SUT CHROMIC 3 0 PS 2 (SUTURE) ×2 IMPLANT
SUT CHROMIC 3 0 SH 27 (SUTURE) ×1 IMPLANT
SYR BULB IRRIG 60ML STRL (SYRINGE) ×2 IMPLANT
SYR CONTROL 10ML LL (SYRINGE) ×2 IMPLANT
TRAY ENT MC OR (CUSTOM PROCEDURE TRAY) ×2 IMPLANT
TUBING IRRIGATION (MISCELLANEOUS) ×2 IMPLANT
YANKAUER SUCT BULB TIP NO VENT (SUCTIONS) ×2 IMPLANT

## 2023-02-24 NOTE — Anesthesia Procedure Notes (Signed)
Procedure Name: Intubation Date/Time: 02/24/2023 10:45 AM  Performed by: Stanton Kidney, CRNAPre-anesthesia Checklist: Patient identified, Emergency Drugs available, Suction available and Patient being monitored Patient Re-evaluated:Patient Re-evaluated prior to induction Oxygen Delivery Method: Circle system utilized Preoxygenation: Pre-oxygenation with 100% oxygen Induction Type: IV induction Ventilation: Mask ventilation without difficulty Laryngoscope Size: McGraph and 3 Grade View: Grade I Tube type: nasal rae. Nasal Tubes: Nasal prep performed and Nasal Rae Tube size: 6.5 mm Placement Confirmation: ETT inserted through vocal cords under direct vision, positive ETCO2 and breath sounds checked- equal and bilateral Tube secured with: Tape Dental Injury: Teeth and Oropharynx as per pre-operative assessment

## 2023-02-24 NOTE — Anesthesia Postprocedure Evaluation (Signed)
Anesthesia Post Note  Patient: [name]  Procedure(s) Performed: DENTAL RESTORATION/EXTRACTIONS     Patient location during evaluation: PACU Anesthesia Type: General Level of consciousness: awake and alert Pain management: pain level controlled Vital Signs Assessment: post-procedure vital signs reviewed and stable Respiratory status: spontaneous breathing, nonlabored ventilation, respiratory function stable and patient connected to nasal cannula oxygen Cardiovascular status: blood pressure returned to baseline and stable Postop Assessment: no apparent nausea or vomiting Anesthetic complications: no   No notable events documented.  Last Vitals:  Vitals:   02/24/23 1145 02/24/23 1153  BP: 110/60   Pulse: 76 83  Resp: 15 16  Temp:  (!) 36.2 C  SpO2: 93% 93%    Last Pain:  Vitals:   02/24/23 1120  PainSc: 6                  Maurissa Ambrose P Cat[place] Labrecque

## 2023-02-24 NOTE — CV Procedure (Signed)
02/24/2023  11:07 AM  PATIENT:  [name]  72 y.o. female  PRE-OPERATIVE DIAGNOSIS:  NONRESTORABLE TEETH # 6, 7, 21, 22, 27, 28, 29, 30, 31, BILATERAL MANDIBULAR LINGUAL TORI POST-OPERATIVE DIAGNOSIS:  SAME  PROCEDURE:  Procedure(s): EXTRACTION TEETH # 6, 7, 21, 22, 27, 28, 29, 30, 31, ALVEOLOPLASTY RIGHT AND LEFT MANDIBLE, REMOVAL BILATERAL MANDIBULAR LINGUAL TORI  SURGEON:  Surgeon(s): Ocie Doyne, DMD  ANESTHESIA:   local and general  EBL:  minimal  DRAINS: none   SPECIMEN:  No Specimen  COUNTS:  YES  PLAN OF CARE: Discharge to home after PACU  PATIENT DISPOSITION:  PACU - hemodynamically stable.   PROCEDURE DETAILS: Dictation # [id number]  Georgia Lopes, DMD 02/24/2023 11:07 AM

## 2023-02-24 NOTE — Op Note (Signed)
 NAME: [name], [name] MEDICAL RECORD NO: [id number] ACCOUNT NO: [id number] DATE OF BIRTH: 1950/05/14 FACILITY: [facility] LOCATION: [facility] PHYSICIAN: Georgia Lopes, DDS  Operative Report   DATE OF PROCEDURE: 02/24/2023  PREOPERATIVE DIAGNOSES: 1.  Nonrestorable teeth number 6, 7, 21, 22, 27, 28, 29, 30, 31, secondary to dental caries and periodontal disease. 2.  Bilateral mandibular lingual tori.  POSTOPERATIVE DIAGNOSES: 1.  Nonrestorable teeth number 6, 7, 21, 22, 27, 28, 29, 30, 31, secondary to dental caries and periodontal disease. 2.  Bilateral mandibular lingual tori.  PROCEDURE:  Extraction of teeth numbers 6, 7, 21, 22, 27, 28, 29, 30, 31, alveoloplasty, right and left mandible.  Removal of bilateral mandibular lingual tori.  SURGEON:  Georgia Lopes, DDS  ANESTHESIA:  General, nasal intubation.  Dr. Nance Pew attending.  DESCRIPTION OF PROCEDURE:  The patient was taken to the operating room and placed on the table in the supine position.  General anesthesia was administered.  A nasal endotracheal tube was placed and secured.  The eyes were protected.  The patient was  draped for surgery.  Timeout was performed.  The posterior pharynx was suctioned.  A throat pack was placed.  2% lidocaine 1:100,000 epinephrine was infiltrated in an inferior alveolar block on the right and left sides and buccal and palatal infiltration  around teeth numbers 6 and 7 in the maxilla.  Additional local anesthesia was given in the anterior mandible around the anterior teeth.  A bite block was placed on the right side of the mouth.  A Sweetheart retractor was used to retract the tongue.  The  left side was operated first.  A #15 blade was used to make an incision on the alveolar crest beginning in the area of the first molar and carried forward tooth #21 and then in the gingival sulcus around tooth #22 both buccally and lingually and then  across the midline to tooth #27 along the alveolar crest.  The  periosteum was reflected.  The teeth were elevated.  Teeth numbers 21 and 22 were removed with the dental forceps.  The periosteum and tissue was trimmed to allow for primary closure and then  reflected both buccally and lingually.  There was regular contour of the buccal portion of the alveolar crest, so alveoloplasty was performed using the egg bur and then the bone file was used to further smooth the area.  Then, the tissue was reflected  lingually to expose the lingual torus.  A Seldin retractor was used to protect the lingual tissues and then the Stryker handpiece with egg bur under irrigation was used to reduce the lingual torus by recontouring.  Then, the area was smoothed with a bone  file and then closed with 3-0 chromic.  Attention was turned to the right mandible after removing the bite block and Sweetheart retractor.  A #15 blade was used to make an incision around teeth numbers 27, 28, 29, 30, and 31 in both the buccal and lingual  sulcus.  The periosteum was reflected.  The teeth were elevated with a 301 elevator and removed with dental forceps.  The sockets were curetted.  The periosteum was reflected to expose the alveolar ridge, which had sharp areas and irregular contour.  The  alveoloplasty was then performed using the egg bur followed by the bone file.  The tissue was then reflected lingually to expose the lingual torus and then the egg bur followed by the bone file was used to remove the torus and smooth  the bone.  Then,  the right mandible was irrigated and closed with 3-0 chromic.  In the maxilla, the incision was made around teeth numbers 6 and 7 in the gingival sulcus.  The teeth were elevated with a 301 elevator and removed from the mouth with the dental forceps.   The sockets were curetted and irrigated.  The tissue and bone were smoothed slightly and then the area was closed with 3-0 chromic.  Then, additional local anesthesia was administered.  The oral cavity was irrigated and  suctioned.  The throat pack was  removed.  The patient was left in care of anesthesia for extubation and transported to the recovery room with plans for discharge home through day surgery.  ESTIMATED BLOOD LOSS:  Minimum.  COMPLICATIONS:  None.  SPECIMENS:  None.  COUNTS:  Correct.   PUS D: 02/24/2023 11:12:19 am T: 02/24/2023 12:02:00 pm  JOB: [id number]/ [id number]

## 2023-02-24 NOTE — H&P (Signed)
H&P documentation  -History and Physical Reviewed  -Patient has been re-examined  -No change in the plan of care  [name]  

## 2023-02-24 NOTE — Transfer of Care (Signed)
Immediate Anesthesia Transfer of Care Note  Patient: [name]  Procedure(s) Performed: DENTAL RESTORATION/EXTRACTIONS  Patient Location: PACU  Anesthesia Type:General  Level of Consciousness: awake, alert , and oriented  Airway & Oxygen Therapy: Patient Spontanous Breathing  Post-op Assessment: Report given to RN and Post -op Vital signs reviewed and stable  Post vital signs: Reviewed and stable  Last Vitals:  Vitals Value Taken Time  BP 121/61 02/24/23 1121  Temp 36.3 C 02/24/23 1120  Pulse 79 02/24/23 1127  Resp 20 02/24/23 1127  SpO2 93 % 02/24/23 1127  Vitals shown include unfiled device data.  Last Pain:  Vitals:   02/24/23 1120  PainSc: 6       Patients Stated Pain Goal: 0 (02/24/23 0818)  Complications: No notable events documented.

## 2023-02-25 ENCOUNTER — Telehealth: Payer: Self-pay | Admitting: Internal Medicine

## 2023-02-25 ENCOUNTER — Encounter (HOSPITAL_COMMUNITY): Payer: Self-pay | Admitting: Oral Surgery

## 2023-02-25 NOTE — Telephone Encounter (Signed)
Please call radiology reading room and let them know that patient had CAT scan of the chest on 02/13/2023.  Patient has called the office requesting results.  Please asked that they read these films and send me the results as soon as possible.

## 2023-02-27 ENCOUNTER — Other Ambulatory Visit: Payer: 59

## 2023-02-27 NOTE — Telephone Encounter (Signed)
Pt called saying she did read her results on her my chart and would like a nurse to call her back to be sure of what she read in the report  CB@  [phone]

## 2023-02-27 NOTE — Telephone Encounter (Signed)
Called placed to [facility] imaging. Advised that the radiologists are working on getting study read.

## 2023-03-01 ENCOUNTER — Ambulatory Visit (HOSPITAL_COMMUNITY): Payer: 59 | Admitting: Clinical

## 2023-03-01 ENCOUNTER — Encounter (HOSPITAL_COMMUNITY): Payer: Self-pay | Admitting: Clinical

## 2023-03-01 DIAGNOSIS — F4312 Post-traumatic stress disorder, chronic: Secondary | ICD-10-CM | POA: Diagnosis not present

## 2023-03-01 DIAGNOSIS — F1911 Other psychoactive substance abuse, in remission: Secondary | ICD-10-CM

## 2023-03-01 DIAGNOSIS — F4320 Adjustment disorder, unspecified: Secondary | ICD-10-CM

## 2023-03-01 DIAGNOSIS — F4321 Adjustment disorder with depressed mood: Secondary | ICD-10-CM | POA: Diagnosis not present

## 2023-03-01 NOTE — Telephone Encounter (Signed)
Patient sent results today via MyChart.
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
## 2023-03-06 ENCOUNTER — Other Ambulatory Visit: Payer: 59

## 2023-03-08 ENCOUNTER — Telehealth: Payer: Self-pay | Admitting: Internal Medicine

## 2023-03-08 NOTE — Telephone Encounter (Signed)
Patient needs refill of Breztri inhaler. [facility] faxed over a request but have not heard from the office.   Pharmacy [facility] [address]

## 2023-03-13 MED ORDER — BREZTRI AEROSPHERE 160-9-4.8 MCG/ACT IN AERO: 2.0000 | INHALATION_SPRAY | Freq: Two times a day (BID) | RESPIRATORY_TRACT | 12 refills | Status: DC

## 2023-03-13 NOTE — Telephone Encounter (Signed)
Rx sent to pharmacy   

## 2023-03-13 NOTE — Telephone Encounter (Addendum)
Pt calling back about her refill  [id number] - [place],  - [address] AT [facility] OF [address] & [address]

## 2023-03-14 ENCOUNTER — Ambulatory Visit (INDEPENDENT_AMBULATORY_CARE_PROVIDER_SITE_OTHER): Payer: 59 | Admitting: Otolaryngology

## 2023-03-14 ENCOUNTER — Encounter (INDEPENDENT_AMBULATORY_CARE_PROVIDER_SITE_OTHER): Payer: Self-pay

## 2023-03-14 VITALS — Ht 65.0 in | Wt 124.0 lb

## 2023-03-14 DIAGNOSIS — K118 Other diseases of salivary glands: Secondary | ICD-10-CM | POA: Diagnosis not present

## 2023-03-14 DIAGNOSIS — F17200 Nicotine dependence, unspecified, uncomplicated: Secondary | ICD-10-CM

## 2023-03-14 DIAGNOSIS — F17210 Nicotine dependence, cigarettes, uncomplicated: Secondary | ICD-10-CM

## 2023-03-14 NOTE — Patient Instructions (Signed)
I have ordered an imaging study for you to complete prior to your next visit. Please call [facility] at [phone] to schedule your imaging if you have not received a call within 24 hours. If you are unable to complete your imaging study prior to your next scheduled visit please call our office to let us know.
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
## 2023-03-20 ENCOUNTER — Ambulatory Visit (INDEPENDENT_AMBULATORY_CARE_PROVIDER_SITE_OTHER): Payer: 59 | Admitting: Clinical

## 2023-03-20 DIAGNOSIS — F4312 Post-traumatic stress disorder, chronic: Secondary | ICD-10-CM | POA: Diagnosis not present

## 2023-03-20 DIAGNOSIS — F330 Major depressive disorder, recurrent, mild: Secondary | ICD-10-CM

## 2023-03-20 DIAGNOSIS — F1911 Other psychoactive substance abuse, in remission: Secondary | ICD-10-CM | POA: Diagnosis not present
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
## 2023-03-21 ENCOUNTER — Encounter (HOSPITAL_COMMUNITY): Payer: Self-pay | Admitting: Clinical

## 2023-03-24 ENCOUNTER — Ambulatory Visit (HOSPITAL_COMMUNITY): Admission: RE | Admit: 2023-03-24 | Payer: 59 | Source: Home / Self Care | Admitting: Oral Surgery

## 2023-03-24 ENCOUNTER — Encounter (HOSPITAL_COMMUNITY): Admission: RE | Payer: Self-pay | Source: Home / Self Care

## 2023-03-24 ENCOUNTER — Telehealth: Payer: Self-pay | Admitting: Internal Medicine

## 2023-03-24 ENCOUNTER — Ambulatory Visit (HOSPITAL_COMMUNITY): Payer: 59

## 2023-03-24 DIAGNOSIS — R0609 Other forms of dyspnea: Secondary | ICD-10-CM

## 2023-03-24 SURGERY — DENTAL RESTORATION/EXTRACTIONS
Anesthesia: General

## 2023-03-24 MED ORDER — ALBUTEROL SULFATE HFA 108 (90 BASE) MCG/ACT IN AERS: 2.0000 | INHALATION_SPRAY | Freq: Four times a day (QID) | RESPIRATORY_TRACT | 4 refills | Status: DC | PRN

## 2023-03-24 NOTE — Telephone Encounter (Signed)
Patient states needs refill for Ventolin. Patient out of the medication. Pharmacy is [facility] [address]. Patient phone number is [phone].

## 2023-03-24 NOTE — Telephone Encounter (Signed)
Called and spoke with pt, refills has been placed , nfn

## 2023-03-30 ENCOUNTER — Ambulatory Visit (HOSPITAL_COMMUNITY): Admission: RE | Admit: 2023-03-30 | Payer: 59 | Source: Ambulatory Visit

## 2023-04-03 ENCOUNTER — Telehealth: Payer: Self-pay | Admitting: Internal Medicine

## 2023-04-03 ENCOUNTER — Encounter (HOSPITAL_COMMUNITY): Payer: Self-pay | Admitting: Clinical

## 2023-04-03 ENCOUNTER — Ambulatory Visit (INDEPENDENT_AMBULATORY_CARE_PROVIDER_SITE_OTHER): Payer: 59 | Admitting: Clinical

## 2023-04-03 DIAGNOSIS — F1911 Other psychoactive substance abuse, in remission: Secondary | ICD-10-CM | POA: Diagnosis not present

## 2023-04-03 DIAGNOSIS — F330 Major depressive disorder, recurrent, mild: Secondary | ICD-10-CM

## 2023-04-03 DIAGNOSIS — F4312 Post-traumatic stress disorder, chronic: Secondary | ICD-10-CM

## 2023-04-03 NOTE — Telephone Encounter (Signed)
Copied from CRM [id number]. Topic: Referral - Request for Referral >> Apr 03, 2023  2:14 PM Priscille Loveless wrote: Reason for CRM: Pt is requesting a referral to another dermatologist that she can get in sooner.
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
## 2023-04-05 LAB — LAB REPORT - SCANNED
A1c: 5.4
EGFR (Non-African Amer.): 64

## 2023-04-08 ENCOUNTER — Other Ambulatory Visit: Payer: Self-pay | Admitting: Obstetrics and Gynecology

## 2023-04-08 DIAGNOSIS — N951 Menopausal and female climacteric states: Secondary | ICD-10-CM
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
## 2023-04-10 ENCOUNTER — Encounter: Payer: Self-pay | Admitting: Internal Medicine

## 2023-04-10 ENCOUNTER — Ambulatory Visit (INDEPENDENT_AMBULATORY_CARE_PROVIDER_SITE_OTHER): Payer: 59 | Admitting: Internal Medicine

## 2023-04-10 VITALS — BP 110/64 | HR 81 | Ht 65.5 in | Wt 126.8 lb

## 2023-04-10 DIAGNOSIS — G4700 Insomnia, unspecified: Secondary | ICD-10-CM | POA: Diagnosis not present

## 2023-04-10 DIAGNOSIS — J449 Chronic obstructive pulmonary disease, unspecified: Secondary | ICD-10-CM | POA: Diagnosis not present

## 2023-04-10 NOTE — Patient Instructions (Signed)
Order- sample Stiolto    inhale 2 puffs daily  Please keep trying to cut down on your smoking

## 2023-04-23 ENCOUNTER — Other Ambulatory Visit: Payer: Self-pay | Admitting: Physician Assistant

## 2023-04-23 DIAGNOSIS — G4709 Other insomnia: Secondary | ICD-10-CM

## 2023-04-24 ENCOUNTER — Other Ambulatory Visit (HOSPITAL_COMMUNITY): Payer: Self-pay | Admitting: *Deleted

## 2023-04-24 DIAGNOSIS — F1911 Other psychoactive substance abuse, in remission: Secondary | ICD-10-CM

## 2023-04-24 DIAGNOSIS — Z62819 Personal history of unspecified abuse in childhood: Secondary | ICD-10-CM

## 2023-04-24 DIAGNOSIS — T1490XA Injury, unspecified, initial encounter: Secondary | ICD-10-CM

## 2023-04-24 DIAGNOSIS — F4312 Post-traumatic stress disorder, chronic: Secondary | ICD-10-CM

## 2023-04-24 MED ORDER — TRAZODONE HCL 100 MG PO TABS: 100.0000 mg | ORAL_TABLET | Freq: Every day | ORAL | 0 refills | Status: DC

## 2023-05-08 ENCOUNTER — Telehealth (HOSPITAL_BASED_OUTPATIENT_CLINIC_OR_DEPARTMENT_OTHER): Payer: 59 | Admitting: Psychiatry

## 2023-05-08 ENCOUNTER — Encounter (HOSPITAL_COMMUNITY): Payer: Self-pay | Admitting: Psychiatry

## 2023-05-08 VITALS — Wt 126.0 lb

## 2023-05-08 DIAGNOSIS — Z62819 Personal history of unspecified abuse in childhood: Secondary | ICD-10-CM

## 2023-05-08 DIAGNOSIS — T1490XA Injury, unspecified, initial encounter: Secondary | ICD-10-CM

## 2023-05-08 DIAGNOSIS — F4312 Post-traumatic stress disorder, chronic: Secondary | ICD-10-CM | POA: Diagnosis not present

## 2023-05-08 DIAGNOSIS — F1911 Other psychoactive substance abuse, in remission: Secondary | ICD-10-CM

## 2023-05-08 MED ORDER — ARIPIPRAZOLE 2 MG PO TABS: 2.0000 mg | ORAL_TABLET | Freq: Every day | ORAL | 2 refills | Status: DC

## 2023-05-08 MED ORDER — SERTRALINE HCL 100 MG PO TABS: 100.0000 mg | ORAL_TABLET | Freq: Every day | ORAL | 0 refills | Status: DC

## 2023-05-08 MED ORDER — TRAZODONE HCL 150 MG PO TABS: 150.0000 mg | ORAL_TABLET | Freq: Every day | ORAL | 2 refills | Status: DC
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
## 2023-05-24 ENCOUNTER — Ambulatory Visit (HOSPITAL_COMMUNITY): Payer: 59 | Admitting: Clinical

## 2023-05-24 ENCOUNTER — Encounter (HOSPITAL_COMMUNITY): Payer: Self-pay | Admitting: Clinical

## 2023-05-24 DIAGNOSIS — F4312 Post-traumatic stress disorder, chronic: Secondary | ICD-10-CM | POA: Diagnosis not present

## 2023-05-24 DIAGNOSIS — F1491 Cocaine use, unspecified, in remission: Secondary | ICD-10-CM

## 2023-05-24 DIAGNOSIS — F1091 Alcohol use, unspecified, in remission: Secondary | ICD-10-CM | POA: Diagnosis not present

## 2023-05-24 DIAGNOSIS — F32A Depression, unspecified: Secondary | ICD-10-CM

## 2023-05-24 DIAGNOSIS — F330 Major depressive disorder, recurrent, mild: Secondary | ICD-10-CM

## 2023-05-24 DIAGNOSIS — F419 Anxiety disorder, unspecified: Secondary | ICD-10-CM | POA: Diagnosis not present

## 2023-05-24 NOTE — Progress Notes (Signed)
 THERAPIST PROGRESS NOTE  Session Time: 2:00pm-3:00pm  Session #8  Virtual Visit via Video Note  I connected with [name] on 05/24/23 at  2:00 PM EST by a video enabled telemedicine application and verified that I am speaking with the correct person using two identifiers.  Location: Patient: home Provider: [facility] outpatient therapy office      I discussed the limitations of evaluation and management by telemedicine and the availability of in person appointments. The patient expressed understanding and agreed to proceed.   I discussed the assessment and treatment plan with the patient. The patient was provided an opportunity to ask questions and all were answered. The patient agreed with the plan and demonstrated an understanding of the instructions.   The patient was advised to call back or seek an in-person evaluation if the symptoms worsen or if the condition fails to improve as anticipated.  I provided 60 minutes of non-face-to-face time during this encounter.     [name], LCSW    Participation Level: Active  Behavioral Response: Casual Alert Euthymic   Type of Therapy: Individual Therapy  Treatment Goals addressed:  LTG: Reduce frequency, intensity, and duration of depression symptoms so that daily functioning is improved LTG: Increase coping skills to manage depression and improve ability to perform daily activities LTG: Kalila will score less than 9 on the Patient Health Questionnaire (PHQ-9) STG: Dalphine will participate in at least 80% of scheduled individual psychotherapy sessions STG: Johonna will identify cognitive patterns and beliefs that support depression LTG: Elimination of maladaptive behaviors and thinking patterns which interfere with resolution of trauma as evidenced by self-report and clinical observation LTG: Develop and implement effective coping skills to carry out normal responsibilities and participate constructively in relationships  as evidenced by self-report STG: Noriko will practice emotion regulation skills 3 time(s) per week for the next 26 week(s) STG: Myrah will practice conflict resolution skills at least 1 times per week for the next 26 weeks STG: Imunique will identify negative coping strategies that have been used to cope with the feelings associated with the trauma STG: Rainy will identify coping strategies to deal with trauma memories and the associated emotional reaction LTG: Camyra will improve quality of life by maintaining ongoing abstinence from all mood-altering substances LTG: Laurina will increase coping skills to promote long-term recovery and improve ability to perform daily activities STG: Yolette will identify 3 personal recovery goals  ProgressTowards Goals: Progressing  Interventions: Assertiveness Training, Supportive, and Other: addiction    Summary: [name] is a 73 y.o. female who presents with chronic PTSD, substance use disorders in sustained remission, significant weight loss, and multiple medical issues. She presented oriented x5 and stated she was feeling sick, I've had the flu the last week.  CSW evaluated patient's medication compliance, use of coping tools, and self-care, as applicable.   She was excited that she had lunch with her nephew and his wife and they have invited her to do it again this coming week.  We processed the fact that she has now realized that she probably makes him feel quite uncomfortable because the first thing she does is ask about her son and then talks about her son and granddaughter too much.  CSW encouraged her to focus on her nephew, asking him questions about what he does and how his life is, etc.  We discussed various things she can ask him about that will keep the conversation going.  We also processed her sobriety, how it  came about, and how she remains sober.  CSW continued to encourage her to go back to [employer] which she stated she used to love, and  she revealed that even though she went to the meetings for a long time and enjoyed them, she never spoke or made friends there.  She went to a variety of rehabs over the years, but states it was ultimately only thoughts of her son and wanting him in her life that got her sober.  CSW also described the Rod stage of life Integrity vs. Despair and explained how much of what she is going through right now seems to fit right into that.  We also talked about other events in her life and processed her feelings about them:   Oldest brother coming from [place]  and bringing the other brother, whom she calls bad brother with him to help unload furniture Only seeing brother for 15 minutes total and not acknowledging bad brother at all Older brother called her apartment manager to report that she has cats living with her even though she did not pay the pet deposit -- so far nothing has been done about this Being sick and tired of feeding all the cats in the neighborhood, as it is now costing her about $100 monthly Not watching news much right now because of the politics and bad news constantly, which makes her crazy Got her dentures and has to get them adjusted to stop hurting Has been sober for 4 years from crack cocaine and 8 years from alcohol  Her pulmonary doctor says her breathing is worse and she knows smoking cigarettes is part of the reason for that Her rheumatoid arthritis pain is worse and she is going to a pain clinic for that She does a lot of cleaning, which fills her time   Suicidal/Homicidal: No without intent/plan  Therapist Response:  Patient is progressing AEB engaging in scheduled therapy session.  Throughout the session, CSW gave patient the opportunity to explore thoughts and feelings associated with current life situations and past/present stressors.   CSW challenged patient gently and appropriately to consider different ways of looking at reported issues. CSW encouraged  patient's expression of feelings and validated these using empathy, active listening, open body language, and unconditional positive regard.   CSW encouraged patient to schedule more therapy sessions for the future, as needed.  She had 2 appointments scheduled in the next few days with CSW, so we discussed this and decided to cancel them.  CSW told her about the group therapy.    Plan: Return again at next available appointment on 2/20  Recommendations:  Return to therapy approximately every 2 weeks, consider a return to [employer], even if on-line  Diagnosis:  Chronic post-traumatic stress disorder (PTSD)  Mild episode of recurrent major depressive disorder (HCC)  Cocaine use disorder in remission  Alcohol  use disorder in remission  Depression, unspecified depression type  Anxiety disorder, unspecified type  Collaboration of Care: Psychiatrist AEB -  psychiatrist can read therapy notes; therapist can and does read psychiatric notes prior to sessions   Patient/Guardian was advised Release of Information must be obtained prior to any record release in order to collaborate their care with an outside provider. Patient/Guardian was advised if they have not already done so to contact the registration department to sign all necessary forms in order for us  to release information regarding their care.   Consent: Patient/Guardian gives verbal consent for treatment and assignment of benefits for services provided  during this visit. Patient/Guardian expressed understanding and agreed to proceed.   [name], LCSW 05/24/2023

## 2023-05-29 ENCOUNTER — Ambulatory Visit (HOSPITAL_COMMUNITY): Payer: 59 | Admitting: Clinical

## 2023-05-30 ENCOUNTER — Telehealth: Payer: Self-pay | Admitting: Internal Medicine

## 2023-05-30 NOTE — Telephone Encounter (Signed)
Patient states having symptom of shortness of breath. Would like to know if she needs stronger inhaler. Pharmacy is [facility] [address]. Patient phone number is [phone].

## 2023-05-30 NOTE — Telephone Encounter (Signed)
ATC X1. Lmtcb

## 2023-05-30 NOTE — Telephone Encounter (Signed)
Called and spoke to patient.  She reports of increased SOB, prod cough with green sputum and mild wheezing. Sx have been present for weeks. Recently dx with the Flu 1 week ago.  Denied f/c/s or additional sx. She is using albuterol QID and breztri BID.  She does not wear supplemental oxygen. She does not monitor her oxygen levels.    Dr. Maple Hudson, please advise. Thanks

## 2023-05-30 NOTE — Telephone Encounter (Signed)
Patient is returning phone call. Patient phone number is [phone].

## 2023-05-31 MED ORDER — AZITHROMYCIN 250 MG PO TABS: ORAL_TABLET | ORAL | 0 refills | Status: DC

## 2023-05-31 NOTE — Telephone Encounter (Signed)
Pt is aware of below message and voiced her understanding, Nothing further needed.

## 2023-05-31 NOTE — Telephone Encounter (Signed)
Too late for Tamiflu. Our main goal is to prevent pneumonia. I am sending Zpak antibiotic to  [facility] She is on azathioprine- an immunosuppressing drug. If she gets worse she should go to ER.

## 2023-06-06 ENCOUNTER — Encounter: Payer: Self-pay | Admitting: Internal Medicine

## 2023-06-06 ENCOUNTER — Ambulatory Visit (HOSPITAL_BASED_OUTPATIENT_CLINIC_OR_DEPARTMENT_OTHER): Payer: Self-pay | Admitting: Internal Medicine

## 2023-06-06 ENCOUNTER — Ambulatory Visit
Admission: RE | Admit: 2023-06-06 | Discharge: 2023-06-06 | Disposition: A | Discharge status: Home / Self Care | Payer: 59 | Source: Ambulatory Visit | Attending: Internal Medicine | Admitting: Internal Medicine

## 2023-06-06 ENCOUNTER — Ambulatory Visit (INDEPENDENT_AMBULATORY_CARE_PROVIDER_SITE_OTHER): Payer: 59 | Admitting: Internal Medicine

## 2023-06-06 VITALS — BP 108/70 | Temp 98.8°F | Ht 65.5 in | Wt 126.0 lb

## 2023-06-06 DIAGNOSIS — R051 Acute cough: Secondary | ICD-10-CM | POA: Diagnosis not present

## 2023-06-06 DIAGNOSIS — F17200 Nicotine dependence, unspecified, uncomplicated: Secondary | ICD-10-CM

## 2023-06-06 DIAGNOSIS — J22 Unspecified acute lower respiratory infection: Secondary | ICD-10-CM | POA: Diagnosis not present

## 2023-06-06 MED ORDER — AZITHROMYCIN 250 MG PO TABS: ORAL_TABLET | ORAL | 0 refills | Status: AC

## 2023-06-06 MED ORDER — METHYLPREDNISOLONE ACETATE 80 MG/ML IJ SUSP
80.0000 mg | Freq: Once | INTRAMUSCULAR | Status: AC
  Administered 2023-06-06: 80 mg via INTRAMUSCULAR

## 2023-06-06 NOTE — Telephone Encounter (Signed)
Chief Complaint: COPD exacerbation Symptoms: SOB Frequency: x few weeks, worsening over past few days Pertinent Negatives: Patient denies fever, CP Disposition: [] ED /[] Urgent Care (no appt availability in office) / [x] Appointment(In office/virtual)/ []  [facility]/ [] Home Care/ [] Refused Recommended Disposition /[] [facility]/ []  Follow-up with PCP Additional Notes: Patient c/o worsening SOB s/p abx treatment. Reports sx has been ongoing for a few weeks, but noticing worsening SOB after finishing abx from pulmonologist. Denies fever, CP. Reports using respiratory tx as prescribed. Attempted to schedule with pulmonologist, but no access. Scheduled patient per protocol with PC on 06/06/2023 virtually d/t lack of transportation and inclement weather. Patient verbalized understanding and to call back/911 with worsening symptoms.      Reason for Disposition  [1] Longstanding difficulty breathing (e.g., CHF, COPD, emphysema) AND [2] WORSE than normal  Answer Assessment - Initial Assessment Questions E2C2 Pulmonary Triage "Chief Complaint (e.g., cough, sob, wheezing, fever, chills, sweat or additional symptoms) *Go to specific symptom protocol after initial questions. Worsening SOB Recently finished abx 3-4 days ago "How long have symptoms been present?" A few days Have you tested for COVID or Flu? Note: If not, ask patient if a home test can be taken. If so, instruct patient to call back for positive results. No, reports having COVID test at home and will test and call back with positive results Does endorse having flu a while ago  MEDICINES:   "Have you used any OTC meds to help with symptoms?" No If yes, ask "What medications?" N/a "Have you used your inhalers/maintenance medication?" Yes If yes, "What medications?" Breztri If inhaler, ask "How many puffs and how often?" Note: Review instructions on medication in the chart. Breztri 2 puffs, twice daily Albuterol  PRN - has been using at night x 6 months   OXYGEN: "Do you wear supplemental oxygen?" No If yes, "How many liters are you supposed to use?" N/a Reports non-compliance with CPAP "Do you monitor your oxygen levels?" No If yes, "What is your reading (oxygen level) today?" N/a Reports in mid 90's at pain doctor's appt recently    1. RESPIRATORY STATUS: "Describe your breathing?" (e.g., wheezing, shortness of breath, unable to speak, severe coughing)      Increased SOB with exertion 2. ONSET: "When did this breathing problem begin?"      A few days after finishing abx 3. PATTERN "Does the difficult breathing come and go, or has it been constant since it started?"      intermittent 4. SEVERITY: "How bad is your breathing?" (e.g., mild, moderate, severe)    - MILD: No SOB at rest, mild SOB with walking, speaks normally in sentences, can lie down, no retractions, pulse < 100.    - MODERATE: SOB at rest, SOB with minimal exertion and prefers to sit, cannot lie down flat, speaks in phrases, mild retractions, audible wheezing, pulse 100-120.    - SEVERE: Very SOB at rest, speaks in single words, struggling to breathe, sitting hunched forward, retractions, pulse > 120      Mild-moderate 5. RECURRENT SYMPTOM: "Have you had difficulty breathing before?" If Yes, ask: "When was the last time?" and "What happened that time?"      Yes 6. CARDIAC HISTORY: "Do you have any history of heart disease?" (e.g., heart attack, angina, bypass surgery, angioplasty)      no 7. LUNG HISTORY: "Do you have any history of lung disease?"  (e.g., pulmonary embolus, asthma, emphysema)     COPD OSA 8. CAUSE: "  What do you think is causing the breathing problem?"      "I think COPD is getting worse" 9. OTHER SYMPTOMS: "Do you have any other symptoms? (e.g., dizziness, runny nose, cough, chest pain, fever)     Smoker's cough, congestion afebrile  Protocols used: Breathing Difficulty-A-AH
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
## 2023-06-08 ENCOUNTER — Ambulatory Visit (HOSPITAL_COMMUNITY): Payer: 59 | Admitting: Clinical

## 2023-06-08 ENCOUNTER — Ambulatory Visit: Payer: 59 | Admitting: Internal Medicine

## 2023-06-08 ENCOUNTER — Encounter: Payer: Self-pay | Admitting: Internal Medicine

## 2023-06-08 VITALS — BP 118/70 | HR 81 | Temp 98.2°F | Ht 65.5 in | Wt 124.6 lb

## 2023-06-08 DIAGNOSIS — J449 Chronic obstructive pulmonary disease, unspecified: Secondary | ICD-10-CM | POA: Diagnosis not present

## 2023-06-08 MED ORDER — BREZTRI AEROSPHERE 160-9-4.8 MCG/ACT IN AERO: 2.0000 | INHALATION_SPRAY | Freq: Two times a day (BID) | RESPIRATORY_TRACT | 12 refills | Status: DC

## 2023-06-08 NOTE — Patient Instructions (Signed)
Breztri refilled  Keep on trying to quit smoking - that's important

## 2023-06-12 ENCOUNTER — Encounter: Payer: Self-pay | Admitting: Adult Health

## 2023-06-12 ENCOUNTER — Ambulatory Visit (INDEPENDENT_AMBULATORY_CARE_PROVIDER_SITE_OTHER): Payer: 59 | Admitting: Adult Health

## 2023-06-12 VITALS — BP 119/66 | HR 75 | Ht 65.0 in | Wt 123.0 lb

## 2023-06-12 DIAGNOSIS — Z5181 Encounter for therapeutic drug level monitoring: Secondary | ICD-10-CM | POA: Diagnosis not present

## 2023-06-12 DIAGNOSIS — R569 Unspecified convulsions: Secondary | ICD-10-CM | POA: Diagnosis not present

## 2023-06-12 MED ORDER — LAMOTRIGINE 100 MG PO TABS: ORAL_TABLET | ORAL | 3 refills | Status: DC
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
## 2023-06-13 ENCOUNTER — Encounter: Payer: Self-pay | Admitting: Adult Health

## 2023-06-13 LAB — COMPREHENSIVE METABOLIC PANEL WITH GFR
ALT: 9 [IU]/L (ref 0–32)
AST: 11 [IU]/L (ref 0–40)
Albumin: 4.6 g/dL (ref 3.9–4.9)
Alkaline Phosphatase: 92 [IU]/L (ref 44–121)
BUN/Creatinine Ratio: 22 (ref 12–28)
BUN: 16 mg/dL (ref 8–27)
Bilirubin Total: <0.2 mg/dL (ref 0.0–1.2)
CO2: 22 mmol/L (ref 20–29)
Calcium: 9.4 mg/dL (ref 8.7–10.3)
Chloride: 103 mmol/L (ref 96–106)
Creatinine, Ser: 0.74 mg/dL (ref 0.57–1.00)
Globulin, Total: 2 g/dL (ref 1.5–4.5)
Glucose: 116 mg/dL — ABNORMAL HIGH (ref 70–99)
Potassium: 4.5 mmol/L (ref 3.5–5.2)
Sodium: 141 mmol/L (ref 134–144)
Total Protein: 6.6 g/dL (ref 6.0–8.5)
eGFR: 87 mL/min/{1.73_m2}

## 2023-06-13 LAB — CBC WITH DIFFERENTIAL/PLATELET
Basophils Absolute: 0.1 10*3/uL (ref 0.0–0.2)
Basos: 1 %
EOS (ABSOLUTE): 0.1 10*3/uL (ref 0.0–0.4)
Eos: 2 %
Hematocrit: 46.4 % (ref 34.0–46.6)
Hemoglobin: 16.1 g/dL — ABNORMAL HIGH (ref 11.1–15.9)
Immature Grans (Abs): 0 10*3/uL (ref 0.0–0.1)
Immature Granulocytes: 0 %
Lymphocytes Absolute: 1.4 10*3/uL (ref 0.7–3.1)
Lymphs: 20 %
MCH: 35 pg — ABNORMAL HIGH (ref 26.6–33.0)
MCHC: 34.7 g/dL (ref 31.5–35.7)
MCV: 101 fL — ABNORMAL HIGH (ref 79–97)
Monocytes Absolute: 0.7 10*3/uL (ref 0.1–0.9)
Monocytes: 11 %
Neutrophils Absolute: 4.5 10*3/uL (ref 1.4–7.0)
Neutrophils: 66 %
Platelets: 300 10*3/uL (ref 150–450)
RBC: 4.6 x10E6/uL (ref 3.77–5.28)
RDW: 13.3 % (ref 11.7–15.4)
WBC: 6.8 10*3/uL (ref 3.4–10.8)

## 2023-06-13 LAB — LAMOTRIGINE LEVEL: Lamotrigine Lvl: 4.4 ug/mL (ref 2.0–20.0)

## 2023-06-13 NOTE — Telephone Encounter (Signed)
 Spoke to patient gave Labwork results Pt asked about MCV,MCH  being elevated due to her smoking Will forward question to Megan,NP . Will forward lab work results to PCP

## 2023-06-13 NOTE — Telephone Encounter (Signed)
-----   Message from [name] sent at 06/13/2023  4:39 PM EST ----- Hemoglobin, MCV, MCH is slightly elevated.  Please forward to PCP

## 2023-06-14 ENCOUNTER — Encounter: Payer: Self-pay | Admitting: Internal Medicine

## 2023-06-14 ENCOUNTER — Other Ambulatory Visit: Payer: Self-pay | Admitting: Internal Medicine

## 2023-06-14 DIAGNOSIS — D7589 Other specified diseases of blood and blood-forming organs: Secondary | ICD-10-CM

## 2023-06-15 ENCOUNTER — Ambulatory Visit (HOSPITAL_COMMUNITY): Payer: 59 | Admitting: Clinical

## 2023-06-15 ENCOUNTER — Encounter (HOSPITAL_COMMUNITY): Payer: Self-pay | Admitting: Clinical

## 2023-06-15 DIAGNOSIS — F1491 Cocaine use, unspecified, in remission: Secondary | ICD-10-CM

## 2023-06-15 DIAGNOSIS — F4312 Post-traumatic stress disorder, chronic: Secondary | ICD-10-CM

## 2023-06-15 DIAGNOSIS — F32A Depression, unspecified: Secondary | ICD-10-CM

## 2023-06-15 DIAGNOSIS — F1091 Alcohol use, unspecified, in remission: Secondary | ICD-10-CM | POA: Diagnosis not present

## 2023-06-15 DIAGNOSIS — F330 Major depressive disorder, recurrent, mild: Secondary | ICD-10-CM | POA: Diagnosis not present
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
## 2023-06-15 NOTE — Telephone Encounter (Signed)
 Please let patient know that her PCP wants to check vit b12 and folate levels as that can cause this. PCP sent her a Clinical cytogeneticist message

## 2023-06-26 ENCOUNTER — Other Ambulatory Visit (HOSPITAL_COMMUNITY): Payer: Self-pay | Admitting: Psychiatry

## 2023-06-26 DIAGNOSIS — F4312 Post-traumatic stress disorder, chronic: Secondary | ICD-10-CM

## 2023-06-26 DIAGNOSIS — Z62819 Personal history of unspecified abuse in childhood: Secondary | ICD-10-CM

## 2023-06-26 DIAGNOSIS — F1911 Other psychoactive substance abuse, in remission: Secondary | ICD-10-CM

## 2023-06-26 DIAGNOSIS — T1490XA Injury, unspecified, initial encounter: Secondary | ICD-10-CM

## 2023-06-28 ENCOUNTER — Ambulatory Visit (HOSPITAL_COMMUNITY): Payer: 59 | Admitting: Clinical

## 2023-06-28 ENCOUNTER — Encounter (HOSPITAL_COMMUNITY): Payer: Self-pay | Admitting: Clinical

## 2023-06-28 ENCOUNTER — Encounter (HOSPITAL_COMMUNITY): Payer: Self-pay

## 2023-06-29 ENCOUNTER — Telehealth: Payer: Self-pay | Admitting: Pulmonary Disease

## 2023-06-30 ENCOUNTER — Telehealth: Payer: Self-pay | Admitting: Internal Medicine

## 2023-06-30 DIAGNOSIS — J449 Chronic obstructive pulmonary disease, unspecified: Secondary | ICD-10-CM

## 2023-06-30 MED ORDER — PREDNISONE 20 MG PO TABS: 20.0000 mg | ORAL_TABLET | Freq: Every day | ORAL | 0 refills | Status: AC

## 2023-06-30 NOTE — Telephone Encounter (Signed)
 Answering Service:  Shortness of breath, inhaler not working.

## 2023-06-30 NOTE — Telephone Encounter (Signed)
 I sent prednisone 20 mg x 5 days. Take in AM with food. She's had two courses of azithromycin so if she does not improve with steroid course of symptoms worsen, she will need in person evaluation at UC/ED. Last seen by Dr. Maple Hudson 2/5 and was completing second z pack course. Doesn't look like she's been treated with any steroids thus far. Use Guaifenesin over the counter twice a day for congestion. She can add on daily allergy pill to see if pollen is impacting her symptoms as well. Thanks.

## 2023-06-30 NOTE — Telephone Encounter (Signed)
 Called patient.  Gave all information.  Patient verbalized understanding.  Patient will pick up prednisone today.

## 2023-06-30 NOTE — Telephone Encounter (Signed)
 Called patient. Patient c/o intermittent episodes this morning of SOB, coughing with yellow/green mucus and wheezing.  Sx ongoing x 6 weeks.  No fever or chills. Patient states she is some improved today.  Wheeze is not as severe.  Patient states she quit smoking yesterday.    Patient states she called the [facility] on-call physician last night around 7:00 pm  (see telephone encounter from 06/29/2023).  Patient was advised to go to the ED.  Patient refused.  Patient wants medication called into pharmacy "so she can breathe better".  Please advise.

## 2023-07-03 ENCOUNTER — Telehealth: Payer: Self-pay | Admitting: Internal Medicine

## 2023-07-03 NOTE — Telephone Encounter (Signed)
 Called patient.  Informed patient [name] did not call in another antibiotic, only prednisone.  Patient verbalized understanding.    Patient informed that she took  an extra prednisone tablet last night instead of her muscle relaxer medication.  Patient states she could not go to sleep.  Patient did not take her dose of prednisone this morning.  Patient wanted to let Dr. Maple Hudson be aware of the extra prednisone tablet taken.  Dr. Maple Hudson, this is FYI.

## 2023-07-03 NOTE — Telephone Encounter (Signed)
 Patient needs a zpak to be called in. She had the prednisone from the pharmacy but they did not have a zpak ordered for her. She also took her prednisone twice instead of taking her muscle relaxer.   Pharmacy on [address]

## 2023-07-04 ENCOUNTER — Other Ambulatory Visit: Payer: Self-pay | Admitting: Internal Medicine

## 2023-07-04 DIAGNOSIS — R0609 Other forms of dyspnea: Secondary | ICD-10-CM

## 2023-07-04 NOTE — Telephone Encounter (Signed)
 Called pharmacy.  Patient picked up 3 boxes of albuterol inhalers on 05/23/2023.  Patient is out of albuterol today.  Pharmacist stated it is too early for patient to fill the albuterol again but if Dr. Maple Hudson writes the directions to states " 2 puffs every 4 hours as needed", the prescription may go through.  Currently, the sig is "2 puffs every 6 hours as needed."  Dr. Maple Hudson, please advise if we can change the directions.  Thank you.  Current Outpatient Medications on File Prior to Visit  Medication Sig Dispense Refill   albuterol (VENTOLIN HFA) 108 (90 Base) MCG/ACT inhaler Inhale 2 puffs into the lungs every 6 (six) hours as needed for wheezing or shortness of breath. 54 g 4   ARIPiprazole (ABILIFY) 2 MG tablet Take 1 tablet (2 mg total) by mouth daily. 30 tablet 2   azaTHIOprine (IMURAN) 50 MG tablet Take 50 mg by mouth daily with breakfast.     Budeson-Glycopyrrol-Formoterol (BREZTRI AEROSPHERE) 160-9-4.8 MCG/ACT AERO Inhale 2 puffs into the lungs 2 (two) times daily. 10.7 g 12   calcium carbonate (OSCAL) 1500 (600 Ca) MG TABS tablet Take 600 mg of elemental calcium by mouth daily with breakfast.     cyanocobalamin (VITAMIN B12) 1000 MCG tablet Take 1,000 mcg by mouth daily.     cycloSPORINE (RESTASIS) 0.05 % ophthalmic emulsion Place 1 drop into both eyes 2 (two) times daily.     estradiol (ESTRACE) 2 MG tablet TAKE 1 TABLET(2 MG) BY MOUTH DAILY 30 tablet 6   Glycerin-Hypromellose-PEG 400 (DRY EYE RELIEF DROPS) 0.2-0.2-1 % SOLN Place 1-2 drops into both eyes 3 (three) times daily as needed (dry/irritated eyes.).     lamoTRIgine (LAMICTAL) 100 MG tablet TAKE 1 TABLET BY MOUTH EVERY MORNING AND 2 TABLET EVERY EVENING 90 tablet 3   Lifitegrast 5 % SOLN Apply to eye.     LINZESS 290 MCG CAPS capsule Take 290 mcg by mouth daily.     meloxicam (MOBIC) 15 MG tablet Take 15 mg by mouth daily.     Multiple Vitamin (MULTIVITAMIN WITH MINERALS) TABS tablet Take 1 tablet by mouth daily after breakfast.  Centrum Women's 50+     naloxone (NARCAN) nasal spray 4 mg/0.1 mL SMARTSIG:Both Nares     oxyCODONE (ROXICODONE) 15 MG immediate release tablet Take 15 mg by mouth 5 (five) times daily.     oxyCODONE-acetaminophen (PERCOCET/ROXICET) 5-325 MG tablet Take 1 tablet by mouth every 6 (six) hours as needed for severe pain (pain score 7-10).     pantoprazole (PROTONIX) 40 MG tablet Take 40 mg by mouth daily before breakfast.     predniSONE (DELTASONE) 20 MG tablet Take 1 tablet (20 mg total) by mouth daily with breakfast for 5 days. 5 tablet 0   sertraline (ZOLOFT) 100 MG tablet Take 1 tablet (100 mg total) by mouth daily. 90 tablet 0   simvastatin (ZOCOR) 40 MG tablet Take 1 tablet (40 mg total) by mouth daily at 6 PM. 90 tablet 1   tiZANidine (ZANAFLEX) 4 MG tablet Take 4 mg by mouth 3 (three) times daily as needed.     traZODone (DESYREL) 150 MG tablet Take 1 tablet (150 mg total) by mouth at bedtime. 30 tablet 2   VITAMIN D PO Take 1,000 Units by mouth daily with breakfast.     Current Facility-Administered Medications on File Prior to Visit  Medication Dose Route Frequency Provider Last Rate Last Admin   Spy Agent Green / Firefly Optime  1.25  mg Intravenous Once Gaynelle Adu, MD        Allergies  Allergen Reactions   Haloperidol Decanoate Anaphylaxis    (At age 15 years old)   Oxybutynin Chloride Other (See Comments)    TROUBLE SWALLOWING   Oxybutynin Other (See Comments)   Tape     Adhesive tape (Tegaderm) --itching  The IV bandage

## 2023-07-04 NOTE — Telephone Encounter (Signed)
 PT states she ran out of Ventolin/Albuterol and she is not due for a refill until April 2nd. Can we help/. Her # is [phone]  Ilda Basset is [name].   (The tech said if Dr. Increased her dosage it would likely go thru the ins.)   [phone] is her CB

## 2023-07-05 MED ORDER — ALBUTEROL SULFATE HFA 108 (90 BASE) MCG/ACT IN AERS: INHALATION_SPRAY | RESPIRATORY_TRACT | 5 refills | Status: AC

## 2023-07-05 NOTE — Telephone Encounter (Signed)
 Albuterol inhaler refilled at [facility]

## 2023-07-07 NOTE — Telephone Encounter (Signed)
 NFN

## 2023-07-11 ENCOUNTER — Telehealth (HOSPITAL_COMMUNITY): Payer: Self-pay

## 2023-07-11 DIAGNOSIS — F1911 Other psychoactive substance abuse, in remission: Secondary | ICD-10-CM

## 2023-07-11 DIAGNOSIS — Z62819 Personal history of unspecified abuse in childhood: Secondary | ICD-10-CM

## 2023-07-11 DIAGNOSIS — T1490XA Injury, unspecified, initial encounter: Secondary | ICD-10-CM

## 2023-07-11 DIAGNOSIS — F4312 Post-traumatic stress disorder, chronic: Secondary | ICD-10-CM

## 2023-07-11 MED ORDER — TRAZODONE HCL 150 MG PO TABS: 150.0000 mg | ORAL_TABLET | Freq: Every day | ORAL | 0 refills | Status: DC

## 2023-07-11 NOTE — Telephone Encounter (Signed)
 Patient called to report that she is out of Trazodone, she is not sleeping well and had to take a second Trazodone several times and now is out. She last saw you in December and has a follow up in April. Please review and advise, thank you

## 2023-07-11 NOTE — Telephone Encounter (Signed)
 She is on a moderate dose of trazodone and taking multiple medication.  I will not recommend to take more trazodone.  I will send a new prescription of trazodone 150 mg at her pharmacy which she is supposed to take.

## 2023-07-20 NOTE — Telephone Encounter (Signed)
 I have attempted to reach this patient and left a voicemail for her to return my call

## 2023-08-01 ENCOUNTER — Telehealth: Payer: Self-pay

## 2023-08-01 MED ORDER — SPIRIVA RESPIMAT 2.5 MCG/ACT IN AERS: INHALATION_SPRAY | RESPIRATORY_TRACT | 1 refills | Status: DC

## 2023-08-01 NOTE — Progress Notes (Signed)
 New treatment goals established:  LTG: Elimination of maladaptive behaviors and thinking patterns which interfere with resolution of trauma as evidenced by self-report and clinical observation  LTG: Develop and implement effective coping skills to carry out normal responsibilities and participate constructively in relationships as evidenced by self-report  STG: Estalene will practice emotion regulation skills 3 time(s) per week for the next 26 week(s)  STG: Jalayia will practice conflict resolution skills at least 1 times per week for the next 26 weeks STG: Arleth will identify negative coping strategies that have been used to cope with the feelings associated with the trauma  STG: Marwah will identify coping strategies to deal with trauma memories and the associated emotional reaction  STG: Caleen will identify cognitive patterns and beliefs that support depression LTG: Faye will improve quality of life by maintaining ongoing abstinence from all mood-altering substances LTG: Felipa will increase coping skills to promote long-term recovery and improve ability to perform daily activities  STG: Amybeth will reduce frequency of avoidant behaviors by 50% as evidenced by self-report in therapy sessions  LTG: Score less than 9 on the PHQ-9 and less than 5 on the GAD-7 as evidenced by intermittent administration of the questionnaires to determine progress in managing depression and anxiety. LTG: Increase coping skills to manage depression/anxiety/anger and improve ability to perform daily activities as evidenced by fewer depressive episodes and/or anger outbursts and/or anxious days per self-report and collateral information.  STG: Process life events to the extent needed so that will be able to move forward with various areas of life in a better frame of mind per self-report.  STG: Learn and practice communication techniques such as active listening, "I" statements, open-ended questions, fair fighting rules,  initiating conversations;  learn about boundary types and how to implement/enforce them AEB self-report of use of same.

## 2023-08-01 NOTE — Telephone Encounter (Signed)
 Copied from CRM [id number]. Topic: Clinical - Prescription Issue >> Jul 31, 2023 10:22 AM [name] wrote: Reason for CRM:   Patient is calling in regards to her Breztri inhaler. She has accidentally thrown her new inhaler away, and only has the old inhaler which is empty. Reports insurance will not cover a replacement Breztri inhaler, so she is requesting provider call in a Spiriva inhaler to cover her until she can refill her Breztri inhaler.   Requests [facility] into [facility] at [facility] file)  CB#   [phone]

## 2023-08-01 NOTE — Telephone Encounter (Signed)
 Please advise on request for replacement inhaler. Thank you!

## 2023-08-03 NOTE — Telephone Encounter (Signed)
 p

## 2023-08-07 ENCOUNTER — Encounter (HOSPITAL_COMMUNITY): Payer: Self-pay | Admitting: Psychiatry

## 2023-08-07 ENCOUNTER — Telehealth (HOSPITAL_BASED_OUTPATIENT_CLINIC_OR_DEPARTMENT_OTHER): Payer: 59 | Admitting: Psychiatry

## 2023-08-07 ENCOUNTER — Telehealth: Payer: Self-pay | Admitting: Internal Medicine

## 2023-08-07 ENCOUNTER — Encounter: Payer: Self-pay | Admitting: Internal Medicine

## 2023-08-07 DIAGNOSIS — F1911 Other psychoactive substance abuse, in remission: Secondary | ICD-10-CM

## 2023-08-07 DIAGNOSIS — Z62819 Personal history of unspecified abuse in childhood: Secondary | ICD-10-CM

## 2023-08-07 DIAGNOSIS — F4312 Post-traumatic stress disorder, chronic: Secondary | ICD-10-CM | POA: Diagnosis not present

## 2023-08-07 DIAGNOSIS — Z0000 Encounter for general adult medical examination without abnormal findings: Secondary | ICD-10-CM

## 2023-08-07 DIAGNOSIS — Z1231 Encounter for screening mammogram for malignant neoplasm of breast: Secondary | ICD-10-CM

## 2023-08-07 MED ORDER — ARIPIPRAZOLE 5 MG PO TABS: 5.0000 mg | ORAL_TABLET | Freq: Every day | ORAL | 2 refills | Status: DC

## 2023-08-07 MED ORDER — TRAZODONE HCL 150 MG PO TABS: 150.0000 mg | ORAL_TABLET | Freq: Every day | ORAL | 2 refills | Status: DC

## 2023-08-07 MED ORDER — SERTRALINE HCL 100 MG PO TABS: 100.0000 mg | ORAL_TABLET | Freq: Every day | ORAL | 2 refills | Status: DC
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
## 2023-08-07 NOTE — Telephone Encounter (Signed)
 FYI Copied from CRM [id number]. Topic: Referral - Request for Referral  >> Aug 07, 2023  4:22 PM Turkey B wrote: Did the patient discuss referral with their provider in the last year? yes    Type of order/referral and detailed reason for visit: bone density and mammogram  Preference of office, provider, location: Breast imaging center  If referral order, have you been seen by this specialty before? yes (If Yes, this issue or another issue? When? Where?08/26/2022 and bone density was 05/2021  Can we respond through MyChart? yes

## 2023-08-08 NOTE — Addendum Note (Signed)
 Addended by: Concetta Dee B on: 08/08/2023 06:33 AM   Modules accepted: Orders

## 2023-08-16 ENCOUNTER — Ambulatory Visit (HOSPITAL_COMMUNITY): Admitting: Clinical

## 2023-08-16 ENCOUNTER — Encounter (HOSPITAL_COMMUNITY): Payer: Self-pay

## 2023-08-16 NOTE — Telephone Encounter (Signed)
 Called but no answer. LVM informing that a mammogram referral has been submitted and [facility] will reach out to the patient to scheduled. Informed that a bone density study is not due until May of next year.

## 2023-08-16 NOTE — Telephone Encounter (Signed)
 FYI  Copied from CRM [id number]. Topic: Referral - Request for Referral >> Aug 07, 2023  4:22 PM Turkey B wrote: Did the patient discuss referral with their provider in the last year? yes    Type of order/referral and detailed reason for visit: bone density and mammogram  Preference of office, provider, location: Breast imaging center  If referral order, have you been seen by this specialty before? yes (If Yes, this issue or another issue? When? Where?08/26/2022 and bone density was 05/2021  Can we respond through MyChart? Yes  >> Aug 16, 2023  2:40 PM DeAngela L wrote: Patient calling to ask if she could get a referral  Breast Imaging and bone density phone number [phone] the patient have been to this location before

## 2023-08-17 ENCOUNTER — Encounter (HOSPITAL_COMMUNITY): Payer: Self-pay | Admitting: Clinical

## 2023-08-17 ENCOUNTER — Ambulatory Visit (HOSPITAL_COMMUNITY): Admitting: Clinical

## 2023-08-17 DIAGNOSIS — F1911 Other psychoactive substance abuse, in remission: Secondary | ICD-10-CM

## 2023-08-17 DIAGNOSIS — F1491 Cocaine use, unspecified, in remission: Secondary | ICD-10-CM | POA: Diagnosis not present

## 2023-08-17 DIAGNOSIS — F4312 Post-traumatic stress disorder, chronic: Secondary | ICD-10-CM | POA: Diagnosis not present

## 2023-08-17 DIAGNOSIS — F330 Major depressive disorder, recurrent, mild: Secondary | ICD-10-CM

## 2023-08-17 DIAGNOSIS — F1091 Alcohol use, unspecified, in remission: Secondary | ICD-10-CM
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
## 2023-08-24 ENCOUNTER — Encounter (HOSPITAL_COMMUNITY): Payer: Self-pay

## 2023-08-25 ENCOUNTER — Encounter (HOSPITAL_COMMUNITY): Payer: Self-pay | Admitting: Clinical

## 2023-08-25 ENCOUNTER — Ambulatory Visit (INDEPENDENT_AMBULATORY_CARE_PROVIDER_SITE_OTHER): Payer: 59 | Admitting: Clinical

## 2023-08-25 DIAGNOSIS — F330 Major depressive disorder, recurrent, mild: Secondary | ICD-10-CM | POA: Diagnosis not present

## 2023-08-25 DIAGNOSIS — F4312 Post-traumatic stress disorder, chronic: Secondary | ICD-10-CM | POA: Diagnosis not present

## 2023-08-25 DIAGNOSIS — F1091 Alcohol use, unspecified, in remission: Secondary | ICD-10-CM

## 2023-08-25 DIAGNOSIS — F1491 Cocaine use, unspecified, in remission: Secondary | ICD-10-CM
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
## 2023-08-29 ENCOUNTER — Ambulatory Visit: Attending: Internal Medicine | Admitting: Internal Medicine

## 2023-08-29 ENCOUNTER — Encounter: Payer: Self-pay | Admitting: Internal Medicine

## 2023-08-29 ENCOUNTER — Ambulatory Visit

## 2023-08-29 VITALS — BP 111/77 | HR 84 | Temp 98.7°F | Resp 16 | Ht 65.0 in | Wt 125.0 lb

## 2023-08-29 DIAGNOSIS — L729 Follicular cyst of the skin and subcutaneous tissue, unspecified: Secondary | ICD-10-CM

## 2023-08-29 DIAGNOSIS — J449 Chronic obstructive pulmonary disease, unspecified: Secondary | ICD-10-CM

## 2023-08-29 DIAGNOSIS — F17200 Nicotine dependence, unspecified, uncomplicated: Secondary | ICD-10-CM

## 2023-08-29 DIAGNOSIS — F1010 Alcohol abuse, uncomplicated: Secondary | ICD-10-CM

## 2023-08-29 DIAGNOSIS — F1911 Other psychoactive substance abuse, in remission: Secondary | ICD-10-CM

## 2023-08-29 DIAGNOSIS — B432 Subcutaneous pheomycotic abscess and cyst: Secondary | ICD-10-CM

## 2023-08-29 DIAGNOSIS — F17210 Nicotine dependence, cigarettes, uncomplicated: Secondary | ICD-10-CM | POA: Diagnosis not present

## 2023-08-29 DIAGNOSIS — D7589 Other specified diseases of blood and blood-forming organs: Secondary | ICD-10-CM | POA: Diagnosis not present

## 2023-08-29 DIAGNOSIS — F3341 Major depressive disorder, recurrent, in partial remission: Secondary | ICD-10-CM | POA: Diagnosis not present

## 2023-08-29 DIAGNOSIS — R634 Abnormal weight loss: Secondary | ICD-10-CM | POA: Diagnosis not present

## 2023-08-29 DIAGNOSIS — Z23 Encounter for immunization: Secondary | ICD-10-CM

## 2023-08-29 DIAGNOSIS — E782 Mixed hyperlipidemia: Secondary | ICD-10-CM | POA: Diagnosis not present

## 2023-08-29 DIAGNOSIS — Z6820 Body mass index (BMI) 20.0-20.9, adult: Secondary | ICD-10-CM

## 2023-08-29 MED ORDER — ZOSTER VAC RECOMB ADJUVANTED 50 MCG/0.5ML IM SUSR: 0.5000 mL | Freq: Once | INTRAMUSCULAR | 0 refills | Status: AC

## 2023-08-29 MED ORDER — SIMVASTATIN 40 MG PO TABS: 40.0000 mg | ORAL_TABLET | Freq: Every day | ORAL | 1 refills | Status: DC

## 2023-08-29 NOTE — Progress Notes (Signed)
 Gum surgery- June 22, 2023  Concerns today with cyst on forehead. Was seen by Dermatology but would like another opinion.

## 2023-08-29 NOTE — Patient Instructions (Signed)
 VISIT SUMMARY:  During today's visit, we discussed your concerns about skin lesions on your forehead, your COPD exacerbation, weight loss, depression, and your ongoing efforts to quit smoking. We also reviewed your psychiatric care, substance use history, and general health maintenance needs.  YOUR PLAN:  -COPD: Chronic Obstructive Pulmonary Disease (COPD) is a lung condition that makes it hard to breathe. You have experienced a slight worsening of symptoms. Continue using Breztri , two puffs twice daily, and rinse your mouth after use. Use albuterol  as needed for shortness of breath. Keep working on quitting smoking and follow up with Dr. Linder Revere for pulmonary care.  -NICOTINE  DEPENDENCE: Nicotine  dependence is an addiction to tobacco products. Although you have tried to quit smoking, you need additional support. Consider using nicotine  replacement therapies when you are ready to quit and contact the quit line for support.  -DEPRESSION, PARTIAL REMISSION: Depression is a mood disorder that causes persistent feelings of sadness and loss of interest. Your depression is in partial remission with Zoloft  and Abilify , though you still have occasional low days. Continue taking Zoloft  100 mg daily and Abilify  5 mg daily, and keep up with regular follow-ups with your psychiatrist and therapist.  -WEIGHT LOSS: You have experienced unintentional weight loss last year and have a poor appetite. Your weight has increased 10 lbs since August last year. Continue consuming nutritional drinks and try to eat small, frequent meals to help maintain your weight.  -VITAMIN B12 AND FOLATE DEFICIENCY MONITORING: We are monitoring your vitamin B12 and folate levels due to a previous elevated MCV level. We will recheck your B12 and folate levels with a blood test.  -SUBSTANCE USE DISORDER: You have been sober from cocaine for 4.5 years and from alcohol  for 6 years. Continue to stay abstinent and maintain your  stability.  -GENERAL HEALTH MAINTENANCE: You need the shingles vaccination. We discussed the Shingrix vaccine series, which includes two doses. The first dose will be administered today, and the second dose is due 2-6 months after the first.  INSTRUCTIONS:  Please follow up with Dr. Linder Revere for your pulmonary care. We will also recheck your B12 and folate levels with a blood test. Continue your regular follow-ups with your psychiatrist and therapist. The second dose of the Shingrix vaccine is due 2-6 months after the first dose.

## 2023-08-29 NOTE — Progress Notes (Signed)
 Patient ID: [name], female    DOB: [birth year]  MRN: [id number]  CC: No chief complaint on file.   Subjective: [name] is a 73 y.o. female who presents for chronic ds management. 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 Mason Sole   Discussed the use of AI scribe software for clinical note transcription with the patient, who gave verbal consent to proceed.  History of Present Illness [name] is a 73 year old female who presents with concerns about skin lesions on her forehead.  She has developed one or two small lesions on her forehead, specifically on the right side near the frontal hairline. The lesions are smaller than a dime. There is no associated pain or discomfort.  The first 1 is over the medial aspect of the right eyebrow.  She saw dermatology at [facility] about 2 months ago for this.  She states that it was an elderly doctor and he did not seem concerned about the lump.  She does not recall whether he thought it was a cyst.  Since then she has noticed a smaller one that has developed close to her hairline on the right side.  COPD/Tob Dep: Followed by [facility].  She used his Brezti inhaler twice a day as prescribed.  Uses the albuterol  inhaler as needed.  She is attempting to quit smoking but has not yet succeeded.  She tried to quit cold [place] but only lasted 2 hours.  She knows about the 1 800 quit NOW program but has not called them as yet to request nicotine  patches.  However she states that she has patches gum and lozenges but has not started using them.  Still concerned about her weight but it has picked up since the last time I saw her.  Weight is up 10 pounds  since August of last year.  She is currently 125 pounds and consumes nutritional drinks with 350 calories and 13 grams of protein twice daily to help maintain her weight. She experiences a lack of appetite and difficulty eating full meals.  She is under psychiatric care, seeing a psychiatrist every three months and attending therapy. She is on Zoloft  100 mg and Abilify  5 mg, which she finds effective, though she experiences occasional low days.   She has abstained from cocaine for four and a half years and from alcohol  for six years.  She mentions a previous check of B12 and folate levels due to an elevated MCV level on CBCs.  She wishes to have these levels rechecked.  She continues to take and tolerate simvastatin  for her cholesterol.    Patient Active Problem List   Diagnosis Date Noted   S/P laparoscopic cholecystectomy 07/19/2022   Urinary urgency 09/17/2021   Pelvic floor dysfunction in female 09/06/2021   Insomnia 07/16/2021   COPD mixed type (HCC) 04/25/2020   Macular pucker, left eye 01/14/2020   Nuclear sclerotic cataract of both eyes 01/14/2020   Peripheral focal chorioretinal inflammation of both eyes 01/14/2020   Encounter for medication review and counseling 01/13/2020   OSA (obstructive sleep apnea) 12/25/2019   Vulvar lesion 07/17/2019   Laryngopharyngeal reflux (LPR) 04/03/2019   Hoarseness 03/25/2019   Status post total replacement of  left hip 03/04/2019   Perennial allergic rhinitis 02/28/2019   Rhinitis medicamentosa 02/28/2019   Primary osteoarthritis of left hip 02/20/2019   Deviated septum 12/25/2018   Primary osteoarthritis of right hip 08/27/2018   Status post right hip replacement 08/27/2018   Chronic sinusitis 06/10/2016   Positive urine drug screen 06/12/2014   Headache 11/18/2013   Seizure disorder (HCC) 11/18/2013   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   Cocaine abuse (HCC) 02/09/2013   Lethargy 02/09/2013   Anxiety 01/20/2012   History of  difficult intubation 01/20/2012   Shoulder joint pain 09/23/2011   Subacromial or subdeltoid bursitis 09/23/2011   Increased frequency of urination 06/15/2011   Postoperative wound infection 05/14/2011   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   SEBORRHEIC KERATOSIS 04/03/2007   EPIDERMOID CYST, BACK 04/03/2007   BREAST TENDERNESS 02/09/2007   TOBACCO ABUSE 12/15/2006   ALLERGIC RHINITIS, SEASONAL 12/15/2006   GERD 12/15/2006   DIVERTICULOSIS, COLON 12/15/2006   DEGENERATIVE DISC DISEASE, LUMBOSACRAL SPINE 12/15/2006   ALCOHOL  ABUSE, HX OF 12/15/2006   History of colonic polyps 12/15/2006   BARRETT'S ESOPHAGUS, HX OF 12/15/2006   HYPERLIPIDEMIA, MIXED 12/13/2006   Depression 12/13/2006   INTERSTITIAL CYSTITIS 12/13/2006   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 30 tablet 2   azaTHIOprine  (IMURAN ) 50 MG tablet Take 50 mg by mouth daily with breakfast.     Budeson-Glycopyrrol-Formoterol  (BREZTRI  AEROSPHERE) 160-9-4.8 MCG/ACT AERO Inhale 2 puffs into the lungs 2 (two) times daily. 10.7 g 12   calcium  carbonate (OSCAL) 1500 (600 Ca) MG TABS tablet Take 600 mg of elemental calcium  by mouth daily with breakfast.     cyanocobalamin (VITAMIN B12) 1000 MCG tablet Take 1,000 mcg by mouth daily.     cycloSPORINE (RESTASIS) 0.05 % ophthalmic emulsion Place 1 drop into both eyes 2 (two) times daily.     estradiol  (ESTRACE ) 2 MG tablet TAKE 1 TABLET(2 MG) BY MOUTH DAILY 30 tablet 6   Glycerin-Hypromellose-PEG 400 (DRY EYE RELIEF DROPS) 0.2-0.2-1 % SOLN Place 1-2 drops into both eyes 3 (three) times daily as needed (dry/irritated eyes.).     lamoTRIgine  (LAMICTAL ) 100 MG tablet TAKE 1 TABLET BY MOUTH EVERY MORNING AND 2 TABLET EVERY  EVENING 90 tablet 3   LINZESS  290 MCG CAPS capsule Take 290 mcg by mouth daily.     Multiple Vitamin (MULTIVITAMIN WITH MINERALS) TABS tablet Take 1 tablet by mouth daily after breakfast. Centrum Women's 50+     naloxone  (NARCAN ) nasal spray 4 mg/0.1 mL SMARTSIG:Both Nares     oxyCODONE -acetaminophen  (PERCOCET/ROXICET) 5-325 MG tablet Take 1 tablet by mouth every 6 (six) hours as needed for severe pain (pain score 7-10).     pantoprazole  (PROTONIX ) 40 MG tablet Take 40 mg by mouth daily before breakfast.     sertraline  (ZOLOFT ) 100 MG tablet Take 1 tablet (100 mg total) by mouth daily. 30 tablet 2   simvastatin  (ZOCOR ) 40 MG tablet Take 1 tablet (40 mg total) by mouth daily at 6 PM. 90 tablet 1   tiZANidine (ZANAFLEX) 4 MG tablet Take 4 mg by mouth 3 (three) times daily as needed.     traZODone  (DESYREL ) 150 MG tablet Take 1 tablet (150 mg total)  by mouth at bedtime. 90 tablet 2   VITAMIN D PO Take 1,000 Units by mouth daily with breakfast.     Current Facility-Administered Medications on File Prior to Visit  Medication Dose Route Frequency Provider Last Rate Last Admin   Spy Agent Green / Firefly Optime  1.25 mg Intravenous Once Aldean Hummingbird, MD        Allergies  Allergen Reactions   Haloperidol Decanoate Anaphylaxis    (At age 61 years old)   Oxybutynin Chloride Other (See Comments)    TROUBLE SWALLOWING   Oxybutynin Other (See Comments)   Tape     Adhesive tape (Tegaderm) --itching  The IV bandage     Social History   Socioeconomic History   Marital status: Divorced    Spouse name: Not on file   Number of children: 1   Years of education: college   Highest education level: Not on file  Occupational History   Occupation: disabled   Tobacco Use   Smoking status: Every Day    Current packs/day: 1.00    Average packs/day: 1 pack/day for 50.0 years (50.0 ttl pk-yrs)    Types: Cigarettes   Smokeless tobacco: Never   Tobacco comments:    Smoking 1 3/4 ppd.  Trying to quit.   06/08/2023 hfb  Vaping Use   Vaping status: Never Used  Substance and Sexual Activity   Alcohol  use: Not Currently    Comment: 07-05-2022 hx alcohol  abuse, quit 10 in 2016 per patient   Drug use: Yes    Types: "Crack" cocaine, Cocaine    Comment: quit 02/26/2019" per patient on 02/23/23   Sexual activity: Not Currently    Birth control/protection: Post-menopausal  Other Topics Concern   Not on file  Social History Narrative   Patient lives at home alone.   Caffeine  Use: 1-2 cups daily   Social Drivers of Health   Financial Resource Strain: Low Risk  (01/25/2023)   Overall Financial Resource Strain (CARDIA)    Difficulty of Paying Living Expenses: Not hard at all  Food Insecurity: No Food Insecurity (01/25/2023)   Hunger Vital Sign    Worried About Running Out of Food in the Last Year: Never true    Ran Out of Food in the Last Year: Never true  Transportation Needs: No Transportation Needs (01/25/2023)   PRAPARE - Administrator, Civil Service (Medical): No    Lack of Transportation (Non-Medical): No  Physical Activity: Sufficiently Active (01/25/2023)   Exercise Vital Sign    Days of Exercise per Week: 6 days    Minutes of Exercise per Session: 60 min  Stress: No Stress Concern Present (01/25/2023)   [employer] of Occupational Health - Occupational Stress Questionnaire    Feeling of Stress : Only a little  Social Connections: Moderately Isolated (01/25/2023)   Social Connection and Isolation Panel [NHANES]    Frequency of Communication with Friends and Family: More than three times a week    Frequency of Social Gatherings with Friends and Family: Once a week    Attends Religious Services: Never    Database administrator or Organizations: Yes    Attends Banker Meetings: Never    Marital Status: Divorced  Catering manager Violence: Not At Risk (10/25/2022)   Humiliation, Afraid, Rape, and Kick questionnaire    Fear of Current or Ex-Partner: No     Emotionally Abused: No    Physically Abused: No    Sexually Abused: No  Family History  Adopted: Yes  Problem Relation Age of Onset   Breast cancer Neg Hx    Allergic rhinitis Neg Hx    Angioedema Neg Hx    Asthma Neg Hx    Atopy Neg Hx    Eczema Neg Hx    Immunodeficiency Neg Hx    Urticaria Neg Hx     Past Surgical History:  Procedure Laterality Date   CATARACT EXTRACTION W/ INTRAOCULAR LENS  IMPLANT, BILATERAL  2012   CHOLECYSTECTOMY N/A 07/19/2022   Procedure: LAPAROSCOPIC CHOLECYSTECTOMY; BILATERAL TAP BLOCK;  Surgeon: Aldean Hummingbird, MD;  Location: [facility];  Service: General;  Laterality: N/A;   CYSTO W/ HYDRODISTENTION/  INSTILLATION THERAPY  multiiple since 2007;  last one 09-27-2013 @[facility]   ,  dr Lavona Pounds   ENDOSCOPIC RETROGRADE CHOLANGIOPANCREATOGRAPHY (ERCP) WITH PROPOFOL  N/A 07/07/2022   Procedure: ENDOSCOPIC RETROGRADE CHOLANGIOPANCREATOGRAPHY (ERCP) WITH PROPOFOL ;  Surgeon: Genell Ken, MD;  Location: [facility] ENDOSCOPY;  Service: Gastroenterology;  Laterality: N/A;   INCISION AND DRAINAGE  05-14-2011  @[facility]    right shoulder   INTERSTIM IMPLANT REMOVAL  01-20-2012  dr Lavona Pounds @[facility]    previously placed 2009   MULTIPLE TOOTH EXTRACTIONS     NASAL SEPTUM SURGERY     x 2 occasions   NEUROPLASTY BRACHIAL PLEXUS  04-28-2011   @[facility]    REMOVAL OF STONES  07/07/2022   Procedure: REMOVAL OF STONES;  Surgeon: Genell Ken, MD;  Location: [facility] ENDOSCOPY;  Service: Gastroenterology;;   SALPINGOOPHORECTOMY Bilateral 1990s   SINOSCOPY     SPHINCTEROTOMY  07/07/2022   Procedure: [address];  Surgeon: Genell Ken, MD;  Location: [facility] ENDOSCOPY;  Service: Gastroenterology;;   TOOTH EXTRACTION N/A 02/24/2023   Procedure: DENTAL RESTORATION/EXTRACTIONS;  Surgeon: Ascencion Lava, DMD;  Location: [facility] OR;  Service: Oral Surgery;  Laterality: N/A;   TOTAL HIP ARTHROPLASTY Right 08/27/2018   Procedure: RIGHT TOTAL HIP ARTHROPLASTY ANTERIOR APPROACH;  Surgeon: Wes Hamman, MD;  Location:  [facility] ORS;  Service: Orthopedics;  Laterality: Right;   TOTAL HIP ARTHROPLASTY Left 03/04/2019   Procedure: LEFT TOTAL HIP ARTHROPLASTY ANTERIOR APPROACH;  Surgeon: Wes Hamman, MD;  Location: [facility] OR;  Service: Orthopedics;  Laterality: Left;   VAGINAL HYSTERECTOMY  1990s    ROS: Review of Systems Negative except as stated above  PHYSICAL EXAM: BP 111/77 (BP Location: Left Arm, Patient Position: Sitting, Cuff Size: Small)   Pulse 84   Temp 98.7 F (37.1 C) (Oral)   Resp 16   Ht 5\' 5"  (1.651 m)   Wt 125 lb (56.7 kg)   SpO2 96%   BMI 20.80 kg/m   Wt Readings from Last 3 Encounters:  08/29/23 125 lb (56.7 kg)  06/12/23 123 lb (55.8 kg)  06/08/23 124 lb 9.6 oz (56.5 kg)    Physical Exam  General appearance - alert, well appearing, older Caucasian female who appears underweight even though her BMI is within normal range and in no distress Mental status - normal mood, behavior, speech, dress, motor activity, and thought processes Chest - clear to auscultation, no wheezes, rales or rhonchi, symmetric air entry Heart - normal rate, regular rhythm, normal S1, S2, no murmurs, rubs, clicks or gallops Extremities - peripheral pulses normal, no pedal edema, no clubbing or cyanosis Skin -she has 0.5 cm soft movable nodule above the medial aspect of the right eyebrow.  She has a smaller 1 close to the hairline on the right side measures 0.2 cm.  Both are nontender to touch.  08/29/2023    2:00 PM 08/29/2023    1:59 PM 08/17/2023   11:54 AM  Depression screen PHQ 2/9  Decreased Interest 0 0   Down, Depressed, Hopeless 0 0   PHQ - 2 Score 0 0   Altered sleeping 3    Tired, decreased energy 1    Change in appetite 0    Feeling bad or failure about yourself  0    Trouble concentrating 0    Moving slowly or fidgety/restless 0    Suicidal thoughts 0    PHQ-9 Score 4       Information is confidential and restricted. Go to Review Flowsheets to unlock data.       Latest Ref Rng & Units  06/12/2023    3:08 PM 01/31/2023   12:27 PM 07/19/2022    1:48 PM  CMP  Glucose 70 - 99 mg/dL 161  93  096   BUN 8 - 27 mg/dL 16  13  14    Creatinine 0.57 - 1.00 mg/dL 0.45  4.09  8.11   Sodium 134 - 144 mmol/L 141  142  138   Potassium 3.5 - 5.2 mmol/L 4.5  4.8  4.1   Chloride 96 - 106 mmol/L 103  104  107   CO2 20 - 29 mmol/L 22  23  24    Calcium  8.7 - 10.3 mg/dL 9.4  9.0  8.2   Total Protein 6.0 - 8.5 g/dL 6.6  6.5  6.3   Total Bilirubin 0.0 - 1.2 mg/dL <9.1  0.3  0.5   Alkaline Phos 44 - 121 IU/L 92  80  43   AST 0 - 40 IU/L 11  15  24    ALT 0 - 32 IU/L 9  12  15     Lipid Panel     Component Value Date/Time   CHOL 138 08/22/2022 1439   TRIG 91 08/22/2022 1439   HDL 54 08/22/2022 1439   CHOLHDL 2.6 08/22/2022 1439   CHOLHDL 2.6 Ratio 01/12/2007 2140   VLDL 31 01/12/2007 2140   LDLCALC 67 08/22/2022 1439   LDLDIRECT 160 05/29/2006 0000    CBC    Component Value Date/Time   WBC 6.8 06/12/2023 1508   WBC 3.9 (L) 07/19/2022 1315   RBC 4.60 06/12/2023 1508   RBC 3.45 (L) 07/19/2022 1315   HGB 16.1 (H) 06/12/2023 1508   HCT 46.4 06/12/2023 1508   PLT 300 06/12/2023 1508   MCV 101 (H) 06/12/2023 1508   MCH 35.0 (H) 06/12/2023 1508   MCH 38.8 (H) 07/19/2022 1315   MCHC 34.7 06/12/2023 1508   MCHC 35.0 07/19/2022 1315   RDW 13.3 06/12/2023 1508   LYMPHSABS 1.4 06/12/2023 1508   MONOABS 0.5 05/04/2022 1434   EOSABS 0.1 06/12/2023 1508   BASOSABS 0.1 06/12/2023 1508    ASSESSMENT AND PLAN: 1. Subcutaneous cyst (Primary) These 2 nodules look like small cysts.  I advised that we monitor for now.  If they start increasing in size, we can send her back to dermatology to have them removed.  2. Macrocytosis - Vitamin B12 - Folate  3. Tobacco dependence Strongly advised to quit smoking.  She has nicotine  replacement therapy in the form of the patches, gum and nicotine  lozenges to use when she feels mentally ready to quit.  4. Chronic obstructive pulmonary disease,  unspecified COPD type (HCC) Able.  Continue Breztri  inhaler  5. Recurrent major depressive disorder, in partial remission (HCC) Plugged in with behavioral health  and reports doing well on Abilify  and Zoloft   6. Substance abuse in remission [facility]) Commended her on this.  Encouraged her to remain alcohol  and drug-free.  7. Mixed hyperlipidemia Continue simvastatin . - Lipid panel - simvastatin  (ZOCOR ) 40 MG tablet; Take 1 tablet (40 mg total) by mouth daily at 6 PM.  Dispense: 90 tablet; Refill: 1  8. Unintentional weight loss She is starting to regain some of her weight.  Advised that she can continue eating her protein bars and nutrition shakes.  Instead of trying to eat 3 big meals, she should try to eat 4-5 smaller meals throughout the day.  9. Need for shingles vaccine We able to doing the Shingrix vaccine series.  Printed prescription given to her to take to her pharmacy to get the first shot. - Zoster Vaccine Adjuvanted [facility]) injection; Inject 0.5 mLs into the muscle once for 1 dose.  Dispense: 0.5 mL; Refill: 0   Patient was given the opportunity to ask questions.  Patient verbalized understanding of the plan and was able to repeat key elements of the plan.   This documentation was completed using Paediatric nurse.  Any transcriptional errors are unintentional.  No orders of the defined types were placed in this encounter.    Requested Prescriptions    No prescriptions requested or ordered in this encounter    No follow-ups on file.  Concetta Dee, MD, FACP

## 2023-08-30 ENCOUNTER — Ambulatory Visit: Payer: Self-pay | Admitting: Internal Medicine

## 2023-08-30 LAB — FOLATE: Folate: >20 ng/mL

## 2023-08-30 LAB — LIPID PANEL
Chol/HDL Ratio: 2.9 ratio (ref 0.0–4.4)
Cholesterol, Total: 157 mg/dL (ref 100–199)
HDL: 54 mg/dL
LDL Chol Calc (NIH): 80 mg/dL (ref 0–99)
Triglycerides: 132 mg/dL (ref 0–149)
VLDL Cholesterol Cal: 23 mg/dL (ref 5–40)

## 2023-08-30 LAB — VITAMIN B12: Vitamin B-12: 1218 pg/mL (ref 232–1245)

## 2023-08-31 ENCOUNTER — Ambulatory Visit
Admission: RE | Admit: 2023-08-31 | Discharge: 2023-08-31 | Disposition: A | Discharge status: Home / Self Care | Source: Ambulatory Visit | Attending: Internal Medicine | Admitting: Internal Medicine

## 2023-08-31 ENCOUNTER — Ambulatory Visit: Payer: 59 | Admitting: Dermatology

## 2023-08-31 DIAGNOSIS — Z1231 Encounter for screening mammogram for malignant neoplasm of breast: Secondary | ICD-10-CM

## 2023-09-03 ENCOUNTER — Other Ambulatory Visit: Payer: Self-pay | Admitting: Internal Medicine

## 2023-09-06 ENCOUNTER — Ambulatory Visit: Payer: Self-pay | Admitting: Internal Medicine

## 2023-09-08 ENCOUNTER — Ambulatory Visit (HOSPITAL_COMMUNITY): Payer: 59 | Admitting: Clinical

## 2023-09-12 ENCOUNTER — Telehealth (HOSPITAL_COMMUNITY): Payer: Self-pay | Admitting: *Deleted

## 2023-09-12 ENCOUNTER — Ambulatory Visit (INDEPENDENT_AMBULATORY_CARE_PROVIDER_SITE_OTHER): Payer: 59

## 2023-09-12 ENCOUNTER — Other Ambulatory Visit (HOSPITAL_COMMUNITY): Payer: Self-pay | Admitting: *Deleted

## 2023-09-12 DIAGNOSIS — F1911 Other psychoactive substance abuse, in remission: Secondary | ICD-10-CM

## 2023-09-12 DIAGNOSIS — Z62819 Personal history of unspecified abuse in childhood: Secondary | ICD-10-CM

## 2023-09-12 DIAGNOSIS — F4312 Post-traumatic stress disorder, chronic: Secondary | ICD-10-CM

## 2023-09-12 MED ORDER — TRAZODONE HCL 150 MG PO TABS: ORAL_TABLET | ORAL | 1 refills | Status: DC

## 2023-09-12 NOTE — Telephone Encounter (Signed)
 Done and pt advised. She will call Dr. Linder Revere about sleep study.

## 2023-09-12 NOTE — Telephone Encounter (Signed)
 Should not double the trazodone  (300 mg). Maximum she can take 200 mg. Please call new script of Trazodone  100 mg to take two tab. Emphasis on sleep study.

## 2023-09-12 NOTE — Telephone Encounter (Signed)
 Pt called with c/o ongoing insomnia. Pt states that she has been taking "extra dose" of the Trazodone  150 mg. She falls asleep fine but then is up during the night and then takes another dose of the Trazodone . Pt says she's experiencing fatigue and anxiety possibly some forgetfulness or brain fog which she denies is r/t extra trazodone  as she says she does not get a "hangover" from this medication. Pt says she will do another sleep study with Dr. Linder Revere despite having had 2 already. Trazodone  script was sent on her last visit 08/07/23 #90 with 2 fills and pt says she has "maybe 15 pills left. Pt has f/u scheduled for 11/07/23. Please review and advise.

## 2023-09-18 ENCOUNTER — Ambulatory Visit (INDEPENDENT_AMBULATORY_CARE_PROVIDER_SITE_OTHER): Payer: 59 | Admitting: Clinical

## 2023-09-18 ENCOUNTER — Encounter (HOSPITAL_COMMUNITY): Payer: Self-pay | Admitting: Clinical

## 2023-09-18 DIAGNOSIS — F330 Major depressive disorder, recurrent, mild: Secondary | ICD-10-CM

## 2023-09-18 DIAGNOSIS — F4312 Post-traumatic stress disorder, chronic: Secondary | ICD-10-CM | POA: Diagnosis not present

## 2023-09-18 DIAGNOSIS — F1911 Other psychoactive substance abuse, in remission: Secondary | ICD-10-CM | POA: Diagnosis not present
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
## 2023-10-03 ENCOUNTER — Encounter (HOSPITAL_COMMUNITY): Payer: Self-pay | Admitting: Clinical

## 2023-10-03 ENCOUNTER — Ambulatory Visit (INDEPENDENT_AMBULATORY_CARE_PROVIDER_SITE_OTHER): Payer: 59 | Admitting: Clinical

## 2023-10-03 DIAGNOSIS — Z62819 Personal history of unspecified abuse in childhood: Secondary | ICD-10-CM | POA: Diagnosis not present

## 2023-10-03 DIAGNOSIS — F1491 Cocaine use, unspecified, in remission: Secondary | ICD-10-CM | POA: Diagnosis not present

## 2023-10-03 DIAGNOSIS — F1091 Alcohol use, unspecified, in remission: Secondary | ICD-10-CM

## 2023-10-03 DIAGNOSIS — F4312 Post-traumatic stress disorder, chronic: Secondary | ICD-10-CM | POA: Diagnosis not present

## 2023-10-03 DIAGNOSIS — F330 Major depressive disorder, recurrent, mild: Secondary | ICD-10-CM

## 2023-10-03 NOTE — Progress Notes (Signed)
 THERAPIST PROGRESS NOTE  Session Time: 2:00pm-3:00pm  Session #14  Virtual Visit via Video Note  I connected with [name] on 10/03/23 at  2:00 PM EDT by a video enabled telemedicine application and verified that I am speaking with the correct person using two identifiers.  Location: Patient: home Provider: home office      I discussed the limitations of evaluation and management by telemedicine and the availability of in person appointments. The patient expressed understanding and agreed to proceed.   I discussed the assessment and treatment plan with the patient. The patient was provided an opportunity to ask questions and all were answered. The patient agreed with the plan and demonstrated an understanding of the instructions.   The patient was advised to call back or seek an in-person evaluation if the symptoms worsen or if the condition fails to improve as anticipated.  I provided 60 minutes of non-face-to-face time during this encounter.     Ancel Kass, LCSW    Participation Level: Active  Behavioral Response: Casual Alert Euthymic   Type of Therapy: Individual Therapy  Treatment Goals addressed:  LTG: Elimination of maladaptive behaviors and thinking patterns which interfere with resolution of trauma as evidenced by self-report and clinical observation  LTG: Develop and implement effective coping skills to carry out normal responsibilities and participate constructively in relationships as evidenced by self-report  STG: Carsyn will practice emotion regulation skills 3 time(s) per week for the next 26 week(s)  STG: Valorie will practice conflict resolution skills at least 1 times per week for the next 26 weeks STG: Bethanne will identify negative coping strategies that have been used to cope with the feelings associated with the trauma  STG: Jaeleen will identify coping strategies to deal with trauma memories and the associated emotional reaction  STG: Pamalee will  identify cognitive patterns and beliefs that support depression LTG: Rahcel will improve quality of life by maintaining ongoing abstinence from all mood-altering substances LTG: Suriah will increase coping skills to promote long-term recovery and improve ability to perform daily activities  STG: Lorilei will reduce frequency of avoidant behaviors by 50% as evidenced by self-report in therapy sessions  LTG: Score less than 9 on the PHQ-9 and less than 5 on the GAD-7 as evidenced by intermittent administration of the questionnaires to determine progress in managing depression and anxiety. LTG: Increase coping skills to manage depression/anxiety/anger and improve ability to perform daily activities as evidenced by fewer depressive episodes and/or anger outbursts and/or anxious days per self-report and collateral information.  STG: Process life events to the extent needed so that will be able to move forward with various areas of life in a better frame of mind per self-report.  STG: Learn and practice communication techniques such as active listening, I statements, open-ended questions, fair fighting rules, initiating conversations;  learn about boundary types and how to implement/enforce them AEB self-report of use of same.   ProgressTowards Goals: Progressing  Interventions: Supportive, Anger Management Training, and Reframing   Summary: [name] is a 73 y.o. female who presents with chronic PTSD, substance use disorders in sustained remission, significant weight loss, and multiple medical issues. She presented oriented x5 and stated she was feeling fragile.  CSW evaluated patient's medication compliance, use of coping tools, and self-care, as applicable.  She provided an update on various aspects of her life that are normally discussed in therapy, including her ongoing struggles to gain weight, her birthday, and the recent visit from [place]   by older brother.  She reported having a good  birthday, getting several gifts which is a novelty for her since her parents did not give Christmas or birthday gifts when she was growing up.  She did her homework of examining how she compares herself to other people and whether the judgments are actually coming from the or could possibly be coming from herself.  She thought mostly about her brother [name] who is in charge of financial matters for the family, is a very successful businessman.  She concluded that the judgments come from BOTH him and herself.  This was explored, as was the way in which she has started being more proactive with him in asking questions without being bothersome.  We also explored his reactions, if any, to her sobriety.  Although she has told him about being clean from crack cocaine for 4-1/2 years and from alcohol  for much longer (does not know exactly), she stated he does not respond when she mentions this.  In future sessions, we could talk about how she might use her newfound confidence to tell him how important her sobriety is to her.  She also shared that one of the ways in which she compares herself unfavorably to others is about education and this has hindered her relationships with neighbors and people in the community.  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  As we talked about her resentments toward mother, brothers, and father, she disclosed that she would like to have her son attend a therapy session with her so that he could share with her what his resentments toward her are.  She was able to speak up in pride that she always told him she loved him, even when she was in active addiction, because her parents never said it to  her.  She also stated, I haven't lied in a long time because I don't have to.  The relief of being sober was  discussed in relation to that statement.  Suicidal/Homicidal: No without intent/plan  Therapist Response:  Patient is progressing AEB engaging in scheduled therapy session.  Throughout the session, CSW gave patient the opportunity to explore thoughts and feelings associated with current life situations and past/present stressors.   CSW challenged patient gently and appropriately to consider different ways of looking at reported issues. CSW encouraged patient's expression of feelings and validated these using empathy, active listening, open body language, and unconditional positive regard.       Plan/Recommendations: Return again at next available appointment on 7/1, CSW to ask whether she has done any of the following:  return to [employer], continue drinking Boost for the calories so does not continue to lose weight, use pill box, get referral for another sleep study, PHQ-9 and GAD-7 to be administered next session  Diagnosis:  Chronic post-traumatic stress disorder (PTSD)  Mild episode of recurrent major depressive disorder (HCC)  Trauma in childhood  Cocaine use disorder in remission  Alcohol  use disorder in remission  Collaboration of Care: Psychiatrist AEB - psychiatrist can read therapy notes; therapist can and does read psychiatric notes prior to sessions   Patient/Guardian was advised Release of Information must be obtained prior to any record release in order to collaborate their care with an outside provider. Patient/Guardian was advised if they have not already  done so to contact the registration department to sign all necessary forms in order for us  to release information regarding their care.   Consent: Patient/Guardian gives verbal consent for treatment and assignment of benefits for services provided during this visit. Patient/Guardian expressed understanding and agreed to proceed.   Ancel Kass, LCSW 10/03/2023

## 2023-10-10 ENCOUNTER — Other Ambulatory Visit (HOSPITAL_COMMUNITY): Payer: Self-pay | Admitting: Psychiatry

## 2023-10-10 DIAGNOSIS — F4312 Post-traumatic stress disorder, chronic: Secondary | ICD-10-CM

## 2023-10-10 DIAGNOSIS — Z62819 Personal history of unspecified abuse in childhood: Secondary | ICD-10-CM

## 2023-10-17 ENCOUNTER — Encounter (HOSPITAL_COMMUNITY): Payer: Self-pay | Admitting: Clinical

## 2023-10-17 ENCOUNTER — Ambulatory Visit (INDEPENDENT_AMBULATORY_CARE_PROVIDER_SITE_OTHER): Payer: 59 | Admitting: Clinical

## 2023-10-17 DIAGNOSIS — F330 Major depressive disorder, recurrent, mild: Secondary | ICD-10-CM | POA: Diagnosis not present

## 2023-10-17 DIAGNOSIS — F4312 Post-traumatic stress disorder, chronic: Secondary | ICD-10-CM | POA: Diagnosis not present

## 2023-10-17 DIAGNOSIS — F1911 Other psychoactive substance abuse, in remission: Secondary | ICD-10-CM

## 2023-10-17 NOTE — Progress Notes (Signed)
 THERAPIST PROGRESS NOTE  Session Time: 2:02pm-2:52pm  Session #15  Virtual Visit via Video Note  I connected with [name] on 10/17/23 at  2:00 PM EDT by a video enabled telemedicine application and verified that I am speaking with the correct person using two identifiers.  Location: Patient: home Provider: home office      I discussed the limitations of evaluation and management by telemedicine and the availability of in person appointments. The patient expressed understanding and agreed to proceed.   I discussed the assessment and treatment plan with the patient. The patient was provided an opportunity to ask questions and all were answered. The patient agreed with the plan and demonstrated an understanding of the instructions.   The patient was advised to call back or seek an in-person evaluation if the symptoms worsen or if the condition fails to improve as anticipated.  I provided 50 minutes of non-face-to-face time during this encounter.     [name], LCSW    Participation Level: Active  Behavioral Response: Casual Alert Euthymic   Type of Therapy: Individual Therapy  Treatment Goals addressed:  LTG: Elimination of maladaptive behaviors and thinking patterns which interfere with resolution of trauma as evidenced by self-report and clinical observation  LTG: Develop and implement effective coping skills to carry out normal responsibilities and participate constructively in relationships as evidenced by self-report  STG: Marriana will practice emotion regulation skills 3 time(s) per week for the next 26 week(s)  STG: Emerly will practice conflict resolution skills at least 1 times per week for the next 26 weeks STG: Alaisha will identify negative coping strategies that have been used to cope with the feelings associated with the trauma  STG: Lamiyah will identify coping strategies to deal with trauma memories and the associated emotional reaction  STG: Kyanna will  identify cognitive patterns and beliefs that support depression LTG: Shastina will improve quality of life by maintaining ongoing abstinence from all mood-altering substances LTG: Zyona will increase coping skills to promote long-term recovery and improve ability to perform daily activities  STG: Cherika will reduce frequency of avoidant behaviors by 50% as evidenced by self-report in therapy sessions  LTG: Score less than 9 on the PHQ-9 and less than 5 on the GAD-7 as evidenced by intermittent administration of the questionnaires to determine progress in managing depression and anxiety. LTG: Increase coping skills to manage depression/anxiety/anger and improve ability to perform daily activities as evidenced by fewer depressive episodes and/or anger outbursts and/or anxious days per self-report and collateral information.  STG: Process life events to the extent needed so that will be able to move forward with various areas of life in a better frame of mind per self-report.  STG: Learn and practice communication techniques such as active listening, I statements, open-ended questions, fair fighting rules, initiating conversations;  learn about boundary types and how to implement/enforce them AEB self-report of use of same.   ProgressTowards Goals: Progressing  Interventions: Strength-based, Supportive, and Other: assessment   Summary: [name] is a 73 y.o. female who presents with chronic PTSD, substance use disorders in sustained remission, significant weight loss, and multiple medical issues. She presented oriented x5 and stated she was feeling hurried because I just woke up.  CSW evaluated patient's medication compliance, use of coping tools, and self-care, as applicable.  She provided an update on various aspects of her life that are normally discussed in therapy, including a lawsuit that has been filed in small claims court  for discrimination by a previous landlord, problems she is having in  current apartment complex, and her sleep being much better currently.  These were discussed and processed in full. PHQ-9 and GAD-7 were not administered again, as planned, because these assessments were administered on 08/30/23 at [facility].  The results were explained to her and compared to her original scores when therapy was first initiated.  Her PHQ-9 score went from 15 on 10/27/22 to 4 on 08/29/23 and her GAD-7 score went from 16 to 1 in the same timeframe.  She was very excited about this.    Suicidal/Homicidal: No without intent/plan  Therapist Response:  Patient is progressing AEB engaging in scheduled therapy session.  Throughout the session, CSW gave patient the opportunity to explore thoughts and feelings associated with current life situations and past/present stressors.   CSW challenged patient gently and appropriately to consider different ways of looking at reported issues. CSW encouraged patient's expression of feelings and validated these using empathy, active listening, open body language, and unconditional positive regard.       Plan/Recommendations: Return again at next available appointment on 7/1, CSW to ask whether she has done any of the following:  return to [employer], continue drinking Boost for the calories so does not continue to lose weight, use pill box, get referral for another sleep study  Diagnosis:  Chronic post-traumatic stress disorder (PTSD)  Mild episode of recurrent major depressive disorder (HCC)  Substance abuse in remission (HCC)  Collaboration of Care: Psychiatrist AEB - psychiatrist can read therapy notes; therapist can and does read psychiatric notes prior to sessions   Patient/Guardian was advised Release of Information must be obtained prior to any record release in order to collaborate their care with an outside provider. Patient/Guardian was advised if they have not already done so to contact the registration department to sign all necessary forms  in order for us  to release information regarding their care.   Consent: Patient/Guardian gives verbal consent for treatment and assignment of benefits for services provided during this visit. Patient/Guardian expressed understanding and agreed to proceed.   [name], LCSW 10/17/2023     08/29/2023    2:00 PM 08/29/2023    1:59 PM 08/17/2023   11:54 AM 01/25/2023    3:06 PM 10/27/2022    2:21 PM  Depression screen PHQ 2/9  Decreased Interest 0 0 1 0 2  Down, Depressed, Hopeless 0 0 1 1 3   PHQ - 2 Score 0 0 2 1 5   Altered sleeping 3  3  1   Tired, decreased energy 1    0  Change in appetite 0  3  3  Feeling bad or failure about yourself  0  2  3  Trouble concentrating 0  2  2  Moving slowly or fidgety/restless 0  0  1  Suicidal thoughts 0  0  0  PHQ-9 Score 4  12  15       08/29/2023    2:00 PM 08/17/2023   11:58 AM 01/25/2023    3:06 PM 10/27/2022    2:19 PM  GAD 7 : Generalized Anxiety Score  Nervous, Anxious, on Edge 0 2 0 3  Control/stop worrying 0 0 0 3  Worry too much - different things 0 0 0 3  Trouble relaxing 0 2 0 2  Restless 0 1 0 0  Easily annoyed or irritable 1 2 1 3   Afraid - awful might happen 0 0 0 2  Total GAD 7 Score 1 7 1 16   Anxiety Difficulty  Somewhat difficult  Extremely difficult

## 2023-10-26 LAB — LAB REPORT - SCANNED
A1c: 5.5
EGFR (Non-African Amer.): 69

## 2023-10-27 ENCOUNTER — Other Ambulatory Visit (HOSPITAL_COMMUNITY): Payer: Self-pay | Admitting: Psychiatry

## 2023-10-27 DIAGNOSIS — F1911 Other psychoactive substance abuse, in remission: Secondary | ICD-10-CM

## 2023-10-27 DIAGNOSIS — Z62819 Personal history of unspecified abuse in childhood: Secondary | ICD-10-CM

## 2023-10-27 DIAGNOSIS — F4312 Post-traumatic stress disorder, chronic: Secondary | ICD-10-CM

## 2023-10-31 ENCOUNTER — Other Ambulatory Visit: Payer: Self-pay | Admitting: Adult Health

## 2023-10-31 ENCOUNTER — Ambulatory Visit (INDEPENDENT_AMBULATORY_CARE_PROVIDER_SITE_OTHER): Admitting: Clinical

## 2023-10-31 ENCOUNTER — Other Ambulatory Visit (HOSPITAL_COMMUNITY): Payer: Self-pay

## 2023-10-31 ENCOUNTER — Encounter (HOSPITAL_COMMUNITY): Payer: Self-pay | Admitting: Clinical

## 2023-10-31 ENCOUNTER — Ambulatory Visit: Payer: Medicare Other | Attending: Internal Medicine

## 2023-10-31 DIAGNOSIS — Z62819 Personal history of unspecified abuse in childhood: Secondary | ICD-10-CM

## 2023-10-31 DIAGNOSIS — F1491 Cocaine use, unspecified, in remission: Secondary | ICD-10-CM

## 2023-10-31 DIAGNOSIS — F1091 Alcohol use, unspecified, in remission: Secondary | ICD-10-CM

## 2023-10-31 DIAGNOSIS — F4312 Post-traumatic stress disorder, chronic: Secondary | ICD-10-CM

## 2023-10-31 DIAGNOSIS — F1911 Other psychoactive substance abuse, in remission: Secondary | ICD-10-CM

## 2023-10-31 DIAGNOSIS — F330 Major depressive disorder, recurrent, mild: Secondary | ICD-10-CM | POA: Diagnosis not present

## 2023-10-31 DIAGNOSIS — G4700 Insomnia, unspecified: Secondary | ICD-10-CM

## 2023-10-31 MED ORDER — TRAZODONE HCL 100 MG PO TABS: ORAL_TABLET | ORAL | 0 refills | Status: DC

## 2023-10-31 NOTE — Progress Notes (Signed)
 THERAPIST PROGRESS NOTE  Session Time: 10:03am-10:58am  Session #16  Virtual Visit via Video Note  I connected with [name] on 10/31/23 at 10:00 AM EDT by a video enabled telemedicine application and verified that I am speaking with the correct person using two identifiers.  Location: Patient: home Provider: home office      I discussed the limitations of evaluation and management by telemedicine and the availability of in person appointments. The patient expressed understanding and agreed to proceed.   I discussed the assessment and treatment plan with the patient. The patient was provided an opportunity to ask questions and all were answered. The patient agreed with the plan and demonstrated an understanding of the instructions.   The patient was advised to call back or seek an in-person evaluation if the symptoms worsen or if the condition fails to improve as anticipated.  I provided 55 minutes of non-face-to-face time during this encounter.     [name], LCSW    Participation Level: Active  Behavioral Response: Casual Alert and Lethargic Euthymic   Type of Therapy: Individual Therapy  Treatment Goals addressed:  LTG: Elimination of maladaptive behaviors and thinking patterns which interfere with resolution of trauma as evidenced by self-report and clinical observation  LTG: Develop and implement effective coping skills to carry out normal responsibilities and participate constructively in relationships as evidenced by self-report  STG: [name] will practice emotion regulation skills 3 time(s) per week for the next 26 week(s)  STG: [name] will practice conflict resolution skills at least 1 times per week for the next 26 weeks STG: [name] will identify negative coping strategies that have been used to cope with the feelings associated with the trauma  STG: [name] will identify coping strategies to deal with trauma memories and the associated emotional reaction   STG: [name] will identify cognitive patterns and beliefs that support depression LTG: [name] will improve quality of life by maintaining ongoing abstinence from all mood-altering substances LTG: [name] will increase coping skills to promote long-term recovery and improve ability to perform daily activities  STG: [name] will reduce frequency of avoidant behaviors by 50% as evidenced by self-report in therapy sessions  LTG: Score less than 9 on the PHQ-9 and less than 5 on the GAD-7 as evidenced by intermittent administration of the questionnaires to determine progress in managing depression and anxiety. LTG: Increase coping skills to manage depression/anxiety/anger and improve ability to perform daily activities as evidenced by fewer depressive episodes and/or anger outbursts and/or anxious days per self-report and collateral information.  STG: Process life events to the extent needed so that will be able to move forward with various areas of life in a better frame of mind per self-report.  STG: Learn and practice communication techniques such as active listening, I statements, open-ended questions, fair fighting rules, initiating conversations;  learn about boundary types and how to implement/enforce them AEB self-report of use of same.   ProgressTowards Goals: Progressing  Interventions: CBT, Supportive, and Other: collateral - medication   Summary: [name] is a 73 y.o. female who presents with chronic PTSD, substance use disorders in sustained remission, significant weight loss, and multiple medical issues. She presented oriented x5 and stated she was feeling not feeling good because I haven't been sleeping well, out of medicine and nothing else I try works.  CSW evaluated patient's medication compliance, use of coping tools, and self-care, as applicable.  She provided an update on various aspects of her life that are  normally discussed in therapy, including her sleep issues, communications  with brother, efforts to gain weight, and lawsuit.  She processed issues she is having with one of her cats who is disrupting her sleep even more than it is naturally disrupted by frequent urination during the night and not having her sleep medicine currently.  CSW advised her to teach the cat to stay outside at night by ignoring its cries, sleeping in her bedroom instead of on the couch, and using white noise to drown out the cries.  CSW reached out via Secure Chat to patient's psychiatrist and CMA, resulting in an order for Trazodone  100 mg (take 2 at at bedtime) being ordered for her, as well as sleep study being ordered.  This was shared with her.  She disclosed that she did share with her older brother that her sobriety is very important to her, but was irritated that he did not respond.  CSW discussed with her the benefit to her of having this information known and the reality of not being able to control his lack of reaction.  CSW continues to encourage her to attend a 12-step meeting for the validation she needs.  We explored ways in which she can socialize more.  She reaches out to a lady in the complex, but this is not well received.  CSW commended her and encouraged her to expand her efforts to include others.  We explored the lack of affection in her childhood and how this translated into her search for somebody to care about her, the effect on her own parenting, and possibly even her substance use.  We also discussed her efforts to gain weight, upcoming meal plans, addition of smoothies she is enjoying, and goal of 135-140 pounds.  The highest weight she has been so far is 127 pounds.  Much encouragement was provided, especially to eat more than once a day, with an explanation given of doctors' opinions on OMAD (one meal a day).  Suicidal/Homicidal: No without intent/plan  Therapist Response:  Patient is progressing AEB engaging in scheduled therapy session.  Throughout the session, CSW gave patient  the opportunity to explore thoughts and feelings associated with current life situations and past/present stressors.   CSW challenged patient gently and appropriately to consider different ways of looking at reported issues. CSW encouraged patient's expression of feelings and validated these using empathy, active listening, open body language, and unconditional positive regard.       Plan/Recommendations: Return again at next scheduled appointment on 7/29, think about adding more socialization into her life, walk even if it is in front of TV, get her sleep medicine delivered and take it as prescribed (new orders), work on eating more than once daily  Diagnosis:  Mild episode of recurrent major depressive disorder (HCC)  Chronic post-traumatic stress disorder (PTSD)  Substance abuse in remission (HCC)  Cocaine use disorder in remission  Alcohol  use disorder in remission  Collaboration of Care: Psychiatrist AEB - psychiatrist can read therapy notes; therapist can and does read psychiatric notes prior to sessions   Patient/Guardian was advised Release of Information must be obtained prior to any record release in order to collaborate their care with an outside provider. Patient/Guardian was advised if they have not already done so to contact the registration department to sign all necessary forms in order for us  to release information regarding their care.   Consent: Patient/Guardian gives verbal consent for treatment and assignment of benefits for services provided during this visit. Patient/Guardian  expressed understanding and agreed to proceed.   [name], LCSW 10/31/2023

## 2023-11-02 ENCOUNTER — Other Ambulatory Visit: Payer: Self-pay | Admitting: Obstetrics and Gynecology

## 2023-11-02 DIAGNOSIS — N951 Menopausal and female climacteric states: Secondary | ICD-10-CM

## 2023-11-03 ENCOUNTER — Ambulatory Visit: Attending: Internal Medicine | Admitting: *Deleted

## 2023-11-03 DIAGNOSIS — Z0000 Encounter for general adult medical examination without abnormal findings: Secondary | ICD-10-CM
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
## 2023-11-07 ENCOUNTER — Encounter (HOSPITAL_COMMUNITY): Payer: Self-pay | Admitting: Psychiatry

## 2023-11-07 ENCOUNTER — Telehealth: Payer: Self-pay

## 2023-11-07 ENCOUNTER — Telehealth (HOSPITAL_COMMUNITY): Admitting: Psychiatry

## 2023-11-07 ENCOUNTER — Telehealth (HOSPITAL_BASED_OUTPATIENT_CLINIC_OR_DEPARTMENT_OTHER): Admitting: Psychiatry

## 2023-11-07 VITALS — Wt 125.0 lb

## 2023-11-07 DIAGNOSIS — Z62819 Personal history of unspecified abuse in childhood: Secondary | ICD-10-CM

## 2023-11-07 DIAGNOSIS — F1911 Other psychoactive substance abuse, in remission: Secondary | ICD-10-CM

## 2023-11-07 DIAGNOSIS — F4312 Post-traumatic stress disorder, chronic: Secondary | ICD-10-CM | POA: Diagnosis not present

## 2023-11-07 MED ORDER — TRAZODONE HCL 100 MG PO TABS: ORAL_TABLET | ORAL | 2 refills | Status: DC

## 2023-11-07 MED ORDER — SERTRALINE HCL 100 MG PO TABS: 100.0000 mg | ORAL_TABLET | Freq: Every day | ORAL | 2 refills | Status: DC

## 2023-11-07 MED ORDER — ARIPIPRAZOLE 5 MG PO TABS: 5.0000 mg | ORAL_TABLET | Freq: Every day | ORAL | 2 refills | Status: DC

## 2023-11-07 NOTE — Telephone Encounter (Signed)
 Copied from CRM [id number]. Topic: Appointments - Scheduling Inquiry for Clinic >> Nov 07, 2023 11:48 AM Isabell A wrote: Reason for CRM: Patient would like Dr.Young to schedule/order her a sleep study test - states her psychiatrist has recommended this.   Callback number: [phone]    Called patient.  Patient states her psychiatrist, Dr. Curry at [facility] states patient is not sleeping good at night and has a diagnosis of sleep apnea and interstitial cystitis.  Dr. Arfeen is considering ordering a sleep test to help with the diagnosis as to why patient is not sleeping good.  He could order the sleep test but he wants Dr. Saundra opinion on this before ordering.  Dr. Neysa, please advise.  Thank you.
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
## 2023-11-08 NOTE — Telephone Encounter (Signed)
 Called patient.  Informed patient of Dr. Sheryll recommendations.  Patient verbalized understanding.  Patient states she does not want to have an in-lab sleep study.  Please do not schedule this until she thinks about this.  She will call our office if she decides to go ahead with the Split Night Sleep study.  Will send Dr. Neysa a message informing of this.  Dr. Neysa, just [facility].

## 2023-11-08 NOTE — Telephone Encounter (Signed)
 Order- schedule split night sleep study   dx OSA, other sleep disorder,   per patient message.

## 2023-11-09 ENCOUNTER — Telehealth: Payer: Self-pay | Admitting: Internal Medicine

## 2023-11-09 NOTE — Telephone Encounter (Signed)
 If [name] would prefer to do a home sleep test instead of in-lab, that's ok. We can change order to home sleep test

## 2023-11-09 NOTE — Telephone Encounter (Signed)
 Copied from CRM [id number]. Topic: Clinical - Lab/Test Results >> Nov 09, 2023 12:57 PM Fonda T wrote:  Reason for CRM: Reason for CRM: Received call from patient, requesting a return call regarding labs drawn at [facility] management office, regarding results. Per patient reports she was informed that labs indicated abnormal rheumatoid atrhritis results.   Patient is requesting to speak with PCP provider more in detail, as well as a possible referral to a Rheumatologist within [facility].   Patient is requesting a return call at [phone] to discuss further.

## 2023-11-10 NOTE — Telephone Encounter (Signed)
 Left message on VM for patient to call regarding scheduling a HST.  Will hold off on ordering until patient contacts office.

## 2023-11-11 NOTE — Telephone Encounter (Signed)
 Have pt get copy of labs for me and schedule in person visit.

## 2023-11-13 ENCOUNTER — Telehealth: Payer: Self-pay | Admitting: Internal Medicine

## 2023-11-13 ENCOUNTER — Ambulatory Visit: Attending: Internal Medicine | Admitting: Internal Medicine

## 2023-11-13 ENCOUNTER — Encounter: Payer: Self-pay | Admitting: Internal Medicine

## 2023-11-13 VITALS — BP 97/61 | HR 64 | Temp 98.2°F | Ht 65.0 in | Wt 126.0 lb

## 2023-11-13 DIAGNOSIS — M419 Scoliosis, unspecified: Secondary | ICD-10-CM

## 2023-11-13 DIAGNOSIS — R768 Other specified abnormal immunological findings in serum: Secondary | ICD-10-CM | POA: Diagnosis not present

## 2023-11-13 DIAGNOSIS — M47813 Spondylosis without myelopathy or radiculopathy, cervicothoracic region: Secondary | ICD-10-CM | POA: Diagnosis not present

## 2023-11-13 DIAGNOSIS — M47816 Spondylosis without myelopathy or radiculopathy, lumbar region: Secondary | ICD-10-CM | POA: Diagnosis not present

## 2023-11-13 DIAGNOSIS — L729 Follicular cyst of the skin and subcutaneous tissue, unspecified: Secondary | ICD-10-CM | POA: Diagnosis not present

## 2023-11-13 DIAGNOSIS — R7689 Other specified abnormal immunological findings in serum: Secondary | ICD-10-CM

## 2023-11-13 NOTE — Telephone Encounter (Signed)
 Transportation Voucher completed.   Pick up date and time: 11/13/2023 4:05 pm.  Pick up address: [address] [facility] [address], [address]  Drop off address: [address] [place]  [address]

## 2023-11-13 NOTE — Telephone Encounter (Signed)
 Copied from CRM [id number]. Topic: Clinical - Request for Lab/Test Order >> Nov 10, 2023  2:38 PM Celestine FALCON wrote: Reason for CRM: Pt is calling back after missing a call regarding a home sleep study. Pt stated she wasn't sure who called her, but wanted to make sure it was ordered. Pt's phone number is [phone] ok to leave a vm.  Tried calling the pt in regards to scheduling sleep test. No answer- left VM to call office back

## 2023-11-13 NOTE — Telephone Encounter (Addendum)
 Called patient verified name and date of birth. Patient has been scheduled for today 11/13/2023 at 2:30 pm, patient acknowledged appointment, and will bring in a copy of her lab results from [facility]

## 2023-11-13 NOTE — Telephone Encounter (Signed)
 Transportation Voucher completed.   Pick up date and time: 11/13/2023 2:00 pm.  Pick up address: [address] [place] [address]   Drop off address: [address] [place], [address]

## 2023-11-13 NOTE — Progress Notes (Signed)
 Patient ID: [name], female    DOB: 11/04/1950  MRN: [id number]  CC: Results (Discuss [facility] lab results Janiece to shingles vax)   Subjective: [name] is a 73 y.o. 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 Maree   Discussed the use of AI scribe software for clinical note transcription with the patient, who gave verbal consent to proceed.  History of Present Illness [name] is a 73 year old female who presents with concerns about her blood test results and scoliosis.  She is concerned about her blood test results, particularly the mildly elevated rheumatoid factor noted.  Blood test were done on the 10th of this month by her pain specialist at [facility].  She brings a copy of her labs for my review.  It showed mild elevation in rheumatoid factor of 26.  Anti-CCP, ANA, sed rate, vitamin D and uric acid levels were all normal.  CBC revealed H/H of 16.8/48.6 with MCV of 101.  Chemistry including LFTs look good.  Patient feels they are ordering these tests to make extra money.  She denies any pain or swelling in the joints of her hands, elbows, shoulders, knees, ankles or toes.  X-rays of her back have revealed scoliosis of the thoracic spine and mild spondylosis of the cervical, thoracic and lumbar spines.  She denies any pain in her neck or back.  I inquired from patient why the x-rays were ordered since they are not seeing her for back pain.  They are prescribing pain medication for chronic abdominal pain.  Patient states that they tell her she has to have this at least every 6 months for them to continue to prescribe narcotic medication. She notices  a shoulder drop on one side when viewing herself on video but does not experience back or neck pain. She feels that the biannual x-rays are unnecessary.  A recent urine drug screen was negative for oxycodone , which she had not taken for two to two and a half days prior to the test. The screen showed a false positive for amphetamines, which she attributes to trazodone  and previous use of decongestants.  She states that her pain specialist was not happy with the fact that there was no oxycodone  on the urine drug screen.  She has an appointment with them later this month  She wanted me to recheck the nodules on the right side of her forehead as she feels they have increased in size since her last visit with me in May.      Patient Active Problem List   Diagnosis Date Noted   S/P laparoscopic cholecystectomy 07/19/2022   Urinary urgency 09/17/2021   Pelvic floor dysfunction in female 09/06/2021   Insomnia 07/16/2021   COPD mixed type (HCC) 04/25/2020   Macular pucker, left eye 01/14/2020   Nuclear sclerotic cataract of both eyes 01/14/2020   Peripheral focal chorioretinal inflammation of both eyes 01/14/2020   Encounter for medication review and counseling 01/13/2020   OSA (obstructive sleep apnea) 12/25/2019   Vulvar lesion 07/17/2019   Laryngopharyngeal reflux (LPR) 04/03/2019   Hoarseness 03/25/2019   Status post total replacement of left hip 03/04/2019   Perennial allergic rhinitis  02/28/2019   Rhinitis medicamentosa 02/28/2019   Primary osteoarthritis of left hip 02/20/2019   Deviated septum 12/25/2018   Primary osteoarthritis of right hip 08/27/2018   Status post right hip replacement 08/27/2018   Chronic sinusitis 06/10/2016   Positive urine drug screen 06/12/2014   Headache 11/18/2013   Seizure disorder (HCC) 11/18/2013   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   Cocaine use disorder in remission 02/09/2013   Lethargy 02/09/2013   Anxiety 01/20/2012   History of difficult  intubation 01/20/2012   Shoulder joint pain 09/23/2011   Subacromial or subdeltoid bursitis 09/23/2011   Increased frequency of urination 06/15/2011   Postoperative wound infection 05/14/2011   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   SEBORRHEIC KERATOSIS 04/03/2007   EPIDERMOID CYST, BACK 04/03/2007   BREAST TENDERNESS 02/09/2007   TOBACCO ABUSE 12/15/2006   ALLERGIC RHINITIS, SEASONAL 12/15/2006   GERD 12/15/2006   DIVERTICULOSIS, COLON 12/15/2006   DEGENERATIVE DISC DISEASE, LUMBOSACRAL SPINE 12/15/2006   ALCOHOL  ABUSE, HX OF 12/15/2006   History of colonic polyps 12/15/2006   BARRETT'S ESOPHAGUS, HX OF 12/15/2006   HYPERLIPIDEMIA, MIXED 12/13/2006   Depression 12/13/2006   INTERSTITIAL CYSTITIS 12/13/2006   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 30 tablet 2   azaTHIOprine  (IMURAN ) 50 MG tablet Take 50 mg by mouth daily with breakfast.     Budeson-Glycopyrrol-Formoterol  (BREZTRI  AEROSPHERE) 160-9-4.8 MCG/ACT AERO Inhale 2 puffs into the lungs 2 (two) times daily. 10.7 g 12   calcium  carbonate (OSCAL) 1500 (600 Ca) MG TABS tablet Take 600 mg of elemental calcium  by mouth daily with breakfast.     cyanocobalamin (VITAMIN B12) 1000 MCG tablet Take 1,000 mcg by mouth daily.     estradiol  (ESTRACE ) 2 MG tablet TAKE 1 TABLET(2 MG) BY MOUTH DAILY 90 tablet 2   Glycerin-Hypromellose-PEG 400 (DRY EYE RELIEF DROPS) 0.2-0.2-1 % SOLN Place 1-2 drops into both eyes 3 (three) times daily as needed (dry/irritated eyes.).     lamoTRIgine  (LAMICTAL ) 100 MG tablet TAKE 1 TABLET BY MOUTH EVERY MORNING AND 2 TABLET EVERY EVENING 90 tablet 3   LINZESS  290 MCG CAPS capsule Take 290 mcg by mouth daily.     Multiple Vitamin (MULTIVITAMIN  WITH MINERALS) TABS tablet Take 1 tablet by mouth daily after breakfast. Centrum Women's 50+     naloxone  (NARCAN ) nasal spray 4 mg/0.1 mL SMARTSIG:Both Nares     oxyCODONE  (ROXICODONE ) 15 MG immediate release tablet Take 15 mg by mouth 5 (five) times daily as needed.     pantoprazole  (PROTONIX ) 40 MG tablet Take 40 mg by mouth daily before breakfast.     sertraline  (ZOLOFT ) 100 MG tablet Take 1 tablet (100 mg total) by mouth daily. 30 tablet 2   simvastatin  (ZOCOR ) 40 MG tablet Take 1 tablet (40 mg total) by mouth daily at 6 PM. 90 tablet 1   tiZANidine (ZANAFLEX) 4 MG tablet Take 4 mg by mouth 3 (three) times daily as needed.     traZODone  (DESYREL ) 100 MG tablet Take 2 tablets (200 mg total) at bedtime as needed. 60 tablet 2   VITAMIN D PO Take 1,000 Units by mouth daily with breakfast.     cycloSPORINE (RESTASIS) 0.05 % ophthalmic emulsion  Place 1 drop into both eyes 2 (two) times daily. (Patient not taking: Reported on 11/13/2023)     oxyCODONE -acetaminophen  (PERCOCET/ROXICET) 5-325 MG tablet Take 1 tablet by mouth every 6 (six) hours as needed for severe pain (pain score 7-10). (Patient not taking: Reported on 11/13/2023)     Current Facility-Administered Medications on File Prior to Visit  Medication Dose Route Frequency Provider Last Rate Last Admin   Spy Agent Green / Firefly Optime  1.25 mg Intravenous Once Tanda Locus, MD        Allergies  Allergen Reactions   Haloperidol Decanoate Anaphylaxis    (At age 4 years old)   Oxybutynin Chloride Other (See Comments)    TROUBLE SWALLOWING   Oxybutynin Other (See Comments)   Tape     Adhesive tape (Tegaderm) --itching  The IV bandage     Social History   Socioeconomic History   Marital status: Divorced    Spouse name: Not on file   Number of children: 1   Years of education: college   Highest education level: Not on file  Occupational History   Occupation: disabled   Tobacco Use   Smoking status: Every Day    Current  packs/day: 1.00    Average packs/day: 1 pack/day for 50.0 years (50.0 ttl pk-yrs)    Types: Cigarettes   Smokeless tobacco: Never   Tobacco comments:    Smoking 1 3/4 ppd.  Trying to quit.  06/08/2023 hfb  Vaping Use   Vaping status: Never Used  Substance and Sexual Activity   Alcohol  use: Not Currently    Comment: 07-05-2022 hx alcohol  abuse, quit 10 in 2016 per patient   Drug use: Yes    Types: Crack cocaine, Cocaine    Comment: quit 02/26/2019 per patient on 02/23/23   Sexual activity: Not Currently    Birth control/protection: Post-menopausal  Other Topics Concern   Not on file  Social History Narrative   Patient lives at home alone.   Caffeine  Use: 1-2 cups daily   Social Drivers of Health   Financial Resource Strain: Low Risk  (11/03/2023)   Overall Financial Resource Strain (CARDIA)    Difficulty of Paying Living Expenses: Not hard at all  Food Insecurity: No Food Insecurity (11/03/2023)   Hunger Vital Sign    Worried About Running Out of Food in the Last Year: Never true    Ran Out of Food in the Last Year: Never true  Transportation Needs: No Transportation Needs (11/03/2023)   PRAPARE - Administrator, Civil Service (Medical): No    Lack of Transportation (Non-Medical): No  Physical Activity: Insufficiently Active (11/03/2023)   Exercise Vital Sign    Days of Exercise per Week: 2 days    Minutes of Exercise per Session: 30 min  Stress: Stress Concern Present (11/03/2023)   [employer] of Occupational Health - Occupational Stress Questionnaire    Feeling of Stress: Rather much  Social Connections: Socially Isolated (11/03/2023)   Social Connection and Isolation Panel    Frequency of Communication with Friends and Family: More than three times a week    Frequency of Social Gatherings with Friends and Family: More than three times a week    Attends Religious Services: Never    Database administrator or Organizations: No    Attends Banker  Meetings: Never    Marital Status: Divorced  Catering manager Violence: Not At Risk (11/03/2023)   Humiliation, Afraid, Rape, and Kick questionnaire  Fear of Current or Ex-Partner: No    Emotionally Abused: No    Physically Abused: No    Sexually Abused: No    Family History  Adopted: Yes  Problem Relation Age of Onset   Breast cancer Neg Hx    Allergic rhinitis Neg Hx    Angioedema Neg Hx    Asthma Neg Hx    Atopy Neg Hx    Eczema Neg Hx    Immunodeficiency Neg Hx    Urticaria Neg Hx     Past Surgical History:  Procedure Laterality Date   CATARACT EXTRACTION W/ INTRAOCULAR LENS  IMPLANT, BILATERAL  2012   CHOLECYSTECTOMY N/A 07/19/2022   Procedure: LAPAROSCOPIC CHOLECYSTECTOMY; BILATERAL TAP BLOCK;  Surgeon: Tanda Locus, MD;  Location: [facility]S;  Service: General;  Laterality: N/A;   CYSTO W/ HYDRODISTENTION/  INSTILLATION THERAPY  multiiple since 2007;  last one 09-27-2013 @[facility]   ,  dr lamar sar   ENDOSCOPIC RETROGRADE CHOLANGIOPANCREATOGRAPHY (ERCP) WITH PROPOFOL  N/A 07/07/2022   Procedure: ENDOSCOPIC RETROGRADE CHOLANGIOPANCREATOGRAPHY (ERCP) WITH PROPOFOL ;  Surgeon: Saintclair Jasper, MD;  Location: [facility] ENDOSCOPY;  Service: Gastroenterology;  Laterality: N/A;   INCISION AND DRAINAGE  05-14-2011  @[facility]    right shoulder   INTERSTIM IMPLANT REMOVAL  01-20-2012  dr lamar sar @[facility]    previously placed 2009   MULTIPLE TOOTH EXTRACTIONS     NASAL SEPTUM SURGERY     x 2 occasions   NEUROPLASTY BRACHIAL PLEXUS  04-28-2011   @[facility]    REMOVAL OF STONES  07/07/2022   Procedure: REMOVAL OF STONES;  Surgeon: Saintclair Jasper, MD;  Location: [facility] ENDOSCOPY;  Service: Gastroenterology;;   SALPINGOOPHORECTOMY Bilateral 1990s   SINOSCOPY     SPHINCTEROTOMY  07/07/2022   Procedure: ANNETT;  Surgeon: Saintclair Jasper, MD;  Location: [facility] ENDOSCOPY;  Service: Gastroenterology;;   TOOTH EXTRACTION N/A 02/24/2023   Procedure: DENTAL RESTORATION/EXTRACTIONS;  Surgeon: Sheryle Hamilton, DMD;  Location:  [facility] OR;  Service: Oral Surgery;  Laterality: N/A;   TOTAL HIP ARTHROPLASTY Right 08/27/2018   Procedure: RIGHT TOTAL HIP ARTHROPLASTY ANTERIOR APPROACH;  Surgeon: Jerri Kay HERO, MD;  Location: [facility] ORS;  Service: Orthopedics;  Laterality: Right;   TOTAL HIP ARTHROPLASTY Left 03/04/2019   Procedure: LEFT TOTAL HIP ARTHROPLASTY ANTERIOR APPROACH;  Surgeon: Jerri Kay HERO, MD;  Location: [facility] OR;  Service: Orthopedics;  Laterality: Left;   VAGINAL HYSTERECTOMY  1990s    ROS: Review of Systems Negative except as stated above  PHYSICAL EXAM: BP 97/61 (BP Location: Left Arm, Patient Position: Sitting, Cuff Size: Normal)   Pulse 64   Temp 98.2 F (36.8 C) (Oral)   Ht 5' 5 (1.651 m)   Wt 126 lb (57.2 kg)   SpO2 96%   BMI 20.97 kg/m   Physical Exam  General appearance -older Caucasian female in NAD Mental status - alert, oriented to person, place, and time Musculoskeletal -patient has mild enlargement of the DIP joints on the left hand.  Other than that no signs of active tenosynovitis in the hands or wrists. She has mild curvature of the mid thoracic spine noted on flexion of the trunk Skin -cystic nodule above the medial aspect of the left eyebrow now measures 1 cm and the other 1 close to the hairline on the right side now measures 0.4 cm both of which have increased since I last saw her in May.      Latest Ref Rng & Units 06/12/2023    3:08 PM 01/31/2023   12:27 PM 07/19/2022  1:48 PM  CMP  Glucose 70 - 99 mg/dL 883  93  893   BUN 8 - 27 mg/dL 16  13  14    Creatinine 0.57 - 1.00 mg/dL 9.25  9.08  8.98   Sodium 134 - 144 mmol/L 141  142  138   Potassium 3.5 - 5.2 mmol/L 4.5  4.8  4.1   Chloride 96 - 106 mmol/L 103  104  107   CO2 20 - 29 mmol/L 22  23  24    Calcium  8.7 - 10.3 mg/dL 9.4  9.0  8.2   Total Protein 6.0 - 8.5 g/dL 6.6  6.5  6.3   Total Bilirubin 0.0 - 1.2 mg/dL <9.7  0.3  0.5   Alkaline Phos 44 - 121 IU/L 92  80  43   AST 0 - 40 IU/L 11  15  24    ALT 0 - 32 IU/L 9  12   15     Lipid Panel     Component Value Date/Time   CHOL 157 08/29/2023 1458   TRIG 132 08/29/2023 1458   HDL 54 08/29/2023 1458   CHOLHDL 2.9 08/29/2023 1458   CHOLHDL 2.6 Ratio 01/12/2007 2140   VLDL 31 01/12/2007 2140   LDLCALC 80 08/29/2023 1458   LDLDIRECT 160 05/29/2006 0000    CBC    Component Value Date/Time   WBC 6.8 06/12/2023 1508   WBC 3.9 (L) 07/19/2022 1315   RBC 4.60 06/12/2023 1508   RBC 3.45 (L) 07/19/2022 1315   HGB 16.1 (H) 06/12/2023 1508   HCT 46.4 06/12/2023 1508   PLT 300 06/12/2023 1508   MCV 101 (H) 06/12/2023 1508   MCH 35.0 (H) 06/12/2023 1508   MCH 38.8 (H) 07/19/2022 1315   MCHC 34.7 06/12/2023 1508   MCHC 35.0 07/19/2022 1315   RDW 13.3 06/12/2023 1508   LYMPHSABS 1.4 06/12/2023 1508   MONOABS 0.5 05/04/2022 1434   EOSABS 0.1 06/12/2023 1508   BASOSABS 0.1 06/12/2023 1508    ASSESSMENT AND PLAN: 1. Elevated rheumatoid factor (Primary) Patient with mild elevation in rheumatoid factor but no signs or symptoms clinically to suggest RA.  I do not think anything further needs to be done at this time.  2. Scoliosis of thoracic spine, unspecified scoliosis type Patient has mild curvature of the thoracic spine noted on x-ray.  She is asymptomatic except she noticed that 1 shoulder is lower than the other.  No need for bracing.  However patient would like to have this evaluated by her orthopedics specialist Dr. JERRI - AMB referral to orthopedics  3. Spondylosis of cervicothoracic spine Asymptomatic  4. Subcutaneous cyst These have increased in size so will refer to dermatology - Ambulatory referral to Dermatology  5. Lumbar spondylosis Asymptomatic. I agree with patient that there is no need to keep having these x-rays done every 6 months.  Patient was given the opportunity to ask questions.  Patient verbalized understanding of the plan and was able to repeat key elements of the plan.   This documentation was completed using Social research officer, government.  Any transcriptional errors are unintentional.  No orders of the defined types were placed in this encounter.    Requested Prescriptions    No prescriptions requested or ordered in this encounter    No follow-ups on file.  Barnie Louder, MD, FACP

## 2023-11-13 NOTE — Telephone Encounter (Signed)
 Noted! Thank you

## 2023-11-14 ENCOUNTER — Encounter (HOSPITAL_COMMUNITY): Payer: Self-pay | Admitting: Clinical

## 2023-11-14 ENCOUNTER — Ambulatory Visit (INDEPENDENT_AMBULATORY_CARE_PROVIDER_SITE_OTHER): Admitting: Clinical

## 2023-11-14 DIAGNOSIS — F4312 Post-traumatic stress disorder, chronic: Secondary | ICD-10-CM | POA: Diagnosis not present

## 2023-11-14 DIAGNOSIS — F330 Major depressive disorder, recurrent, mild: Secondary | ICD-10-CM | POA: Diagnosis not present

## 2023-11-14 DIAGNOSIS — F1911 Other psychoactive substance abuse, in remission: Secondary | ICD-10-CM

## 2023-11-14 DIAGNOSIS — F1091 Alcohol use, unspecified, in remission: Secondary | ICD-10-CM | POA: Diagnosis not present

## 2023-11-14 DIAGNOSIS — F1491 Cocaine use, unspecified, in remission: Secondary | ICD-10-CM | POA: Diagnosis not present

## 2023-11-14 NOTE — Progress Notes (Signed)
 THERAPIST PROGRESS NOTE  Session Time: 10:03am-10:58am  Session #16  Virtual Visit via Video Note  I connected with [name] on 11/16/23 at  2:00 PM EDT by a video enabled telemedicine application and verified that I am speaking with the correct person using two identifiers.  Location: Patient: home Provider: home office      I discussed the limitations of evaluation and management by telemedicine and the availability of in person appointments. The patient expressed understanding and agreed to proceed.   I discussed the assessment and treatment plan with the patient. The patient was provided an opportunity to ask questions and all were answered. The patient agreed with the plan and demonstrated an understanding of the instructions.   The patient was advised to call back or seek an in-person evaluation if the symptoms worsen or if the condition fails to improve as anticipated.  I provided 55 minutes of non-face-to-face time during this encounter.     [name], LCSW    Participation Level: Active  Behavioral Response: Casual Alert and Lethargic Euthymic   Type of Therapy: Individual Therapy  Treatment Goals addressed:  LTG: Elimination of maladaptive behaviors and thinking patterns which interfere with resolution of trauma as evidenced by self-report and clinical observation  LTG: Develop and implement effective coping skills to carry out normal responsibilities and participate constructively in relationships as evidenced by self-report  STG: [name] will practice emotion regulation skills 3 time(s) per week for the next 26 week(s)  STG: [name] will practice conflict resolution skills at least 1 times per week for the next 26 weeks STG: [name] will identify negative coping strategies that have been used to cope with the feelings associated with the trauma  STG: [name] will identify coping strategies to deal with trauma memories and the associated emotional reaction   STG: [name] will identify cognitive patterns and beliefs that support depression LTG: [name] will improve quality of life by maintaining ongoing abstinence from all mood-altering substances LTG: [name] will increase coping skills to promote long-term recovery and improve ability to perform daily activities  STG: [name] will reduce frequency of avoidant behaviors by 50% as evidenced by self-report in therapy sessions  LTG: Score less than 9 on the PHQ-9 and less than 5 on the GAD-7 as evidenced by intermittent administration of the questionnaires to determine progress in managing depression and anxiety. LTG: Increase coping skills to manage depression/anxiety/anger and improve ability to perform daily activities as evidenced by fewer depressive episodes and/or anger outbursts and/or anxious days per self-report and collateral information.  STG: Process life events to the extent needed so that will be able to move forward with various areas of life in a better frame of mind per self-report.  STG: Learn and practice communication techniques such as active listening, I statements, open-ended questions, fair fighting rules, initiating conversations;  learn about boundary types and how to implement/enforce them AEB self-report of use of same.   ProgressTowards Goals: Progressing  Interventions: CBT, Supportive, and Other: collateral - medication   Summary: [name] is a 73 y.o. female who presents with chronic PTSD, substance use disorders in sustained remission, significant weight loss, and multiple medical issues. She presented oriented x5 and stated she was feeling okay I guess.  CSW evaluated patient's medication compliance, use of coping tools, and self-care, as applicable.  She provided an update on various aspects of her life that are normally discussed in therapy, including changes in sleep study that will now be done  from home, coming up short on her pill count at the pain management clinic,  and still not eating well.  She was surprised that there was no oxy in her system at the drug test at the pain management clinic, although she had not taken for 3 days due to running out.  She stated that 20 pills were missing and she does not understand how.  However, she has not been using the pillboxes that she purchased after we discussed it earlier, since she had accidentally spilled them at one point all over the floor.  CSW showed her alternatives that are more secure and easier to use, suggested to her she may have to get them from somewhere other than [employer].  She stated her memory has also had an impact on her ability to remember whether she has taken the pain medicine because it is prescribed 5 times a day and her normal medicines are only 2 times a day.  She is having such memory problems that she will sit on a toilet for awhile, then forget whether she has already urinated.   She demonstrated insight into her problem about the medication, however, by enlisting her friend/neighbor to keep the medicine at her house and give it to her throughout the day.  This will work temporarily.  CSW complimented her on being proactive and encouraged her to tell the pain management clinic since it was help with their concerns as well.  She is still not eating all that well, only about 1 time a day.  She stated she has purchased tomatoes and was going to have a tomato sandwich.  When CSW pointed out that there would be insufficient protein for the day in such a meal, we looked at protein in each part of that meal as compared to how much she needs for her height and weight.  She knows now that she needs about 45 grams of protein daily and was given ideas of how to use small snacks to get closer to that on a daily basis.  CSW explained that this could have something to do with her fatigue as well.  She also shared that the housing inspector came out to her apartment and found 6 things that need to be fixed.  She  does not believe it will damage her relationship to the apartment manager, who already gives her a hard time.   Suicidal/Homicidal: No without intent/plan  Therapist Response:  Patient is progressing AEB engaging in scheduled therapy session.  Throughout the session, CSW gave patient the opportunity to explore thoughts and feelings associated with current life situations and past/present stressors.   CSW challenged patient gently and appropriately to consider different ways of looking at reported issues. CSW encouraged patient's expression of feelings and validated these using empathy, active listening, open body language, and unconditional positive regard.       Plan/Recommendations: Return again at next scheduled appointment on 8/12, work on small protein-based snacks as discussed in session in order to get to 45 grams, get better pill boxes, get pain medication from neighbor instead of dispensing to herself  Diagnosis:  Chronic post-traumatic stress disorder (PTSD)  Substance abuse in remission (HCC)  Mild episode of recurrent major depressive disorder (HCC)  Cocaine use disorder in remission  Alcohol  use disorder in remission  Collaboration of Care: Psychiatrist AEB - psychiatrist can read therapy notes; therapist can and does read psychiatric notes prior to sessions   Patient/Guardian was advised Release of Information must be  obtained prior to any record release in order to collaborate their care with an outside provider. Patient/Guardian was advised if they have not already done so to contact the registration department to sign all necessary forms in order for us  to release information regarding their care.   Consent: Patient/Guardian gives verbal consent for treatment and assignment of benefits for services provided during this visit. Patient/Guardian expressed understanding and agreed to proceed.   [name], LCSW 11/16/2023

## 2023-11-28 ENCOUNTER — Encounter (HOSPITAL_COMMUNITY): Payer: Self-pay | Admitting: Clinical

## 2023-11-28 ENCOUNTER — Ambulatory Visit (HOSPITAL_COMMUNITY): Admitting: Clinical

## 2023-11-28 DIAGNOSIS — F1491 Cocaine use, unspecified, in remission: Secondary | ICD-10-CM | POA: Diagnosis not present

## 2023-11-28 DIAGNOSIS — F330 Major depressive disorder, recurrent, mild: Secondary | ICD-10-CM | POA: Diagnosis not present

## 2023-11-28 DIAGNOSIS — F4312 Post-traumatic stress disorder, chronic: Secondary | ICD-10-CM | POA: Diagnosis not present

## 2023-11-28 DIAGNOSIS — F1091 Alcohol use, unspecified, in remission: Secondary | ICD-10-CM

## 2023-11-28 DIAGNOSIS — F1911 Other psychoactive substance abuse, in remission: Secondary | ICD-10-CM
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
## 2023-12-04 ENCOUNTER — Telehealth: Payer: Self-pay | Admitting: Internal Medicine

## 2023-12-04 NOTE — Telephone Encounter (Signed)
 Copied from CRM [id number]. Topic: Referral - Status >> Dec 04, 2023  2:29 PM Kevelyn M wrote:  Reason for CRM: Patient calling in because her insurance will only cover part of the procedures. She needs to be referred to another dermatologist. Call back #[phone]

## 2023-12-04 NOTE — Telephone Encounter (Signed)
 [name]: Please see patient's message regarding dermatology referral.

## 2023-12-05 ENCOUNTER — Telehealth: Payer: Self-pay

## 2023-12-05 NOTE — Telephone Encounter (Signed)
 Called & spoke to the patient. Verified name & DOB. Informed that a new dermatology referral has been placed. Patient expressed verbal understanding.

## 2023-12-05 NOTE — Telephone Encounter (Signed)
 Copied from CRM [id number]. Topic: Clinical - Request for Lab/Test Order >> Dec 04, 2023  2:35 PM [name] wrote: Reason for CRM: Patient [phone] would like clarification on where or whom is ordering at home sleep study. Patient would prefer to do through us , patient sees Dr. Neysa. Please advise and call back.    Spoke w/ PT she will have to reach out to    Arfeen, Claire DASEN, MD  Since he's the one who order the test not us .   - NFN

## 2023-12-07 ENCOUNTER — Ambulatory Visit: Payer: 59 | Admitting: Internal Medicine

## 2023-12-07 LAB — HM COLONOSCOPY

## 2023-12-12 ENCOUNTER — Encounter (HOSPITAL_COMMUNITY): Payer: Self-pay | Admitting: Clinical

## 2023-12-12 ENCOUNTER — Ambulatory Visit (HOSPITAL_COMMUNITY): Admitting: Clinical

## 2023-12-12 DIAGNOSIS — F330 Major depressive disorder, recurrent, mild: Secondary | ICD-10-CM

## 2023-12-12 DIAGNOSIS — F4312 Post-traumatic stress disorder, chronic: Secondary | ICD-10-CM

## 2023-12-12 DIAGNOSIS — F1911 Other psychoactive substance abuse, in remission: Secondary | ICD-10-CM
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
## 2023-12-13 ENCOUNTER — Telehealth: Payer: Self-pay

## 2023-12-13 NOTE — Telephone Encounter (Signed)
 Copied from CRM #[id number]. Topic: Referral - Status >> Dec 13, 2023  2:31 PM [name] wrote: Reason for CRM: Pt called in about referral to dermatology. Pt has not heard anything and would like to know if she will be referred to an office that accepts medicaid.   Referral status shows closed and pt would like a follow up.

## 2023-12-19 ENCOUNTER — Ambulatory Visit (INDEPENDENT_AMBULATORY_CARE_PROVIDER_SITE_OTHER): Admitting: Orthopaedic Surgery

## 2023-12-19 ENCOUNTER — Encounter: Payer: Self-pay | Admitting: Orthopaedic Surgery

## 2023-12-19 DIAGNOSIS — M545 Low back pain, unspecified: Secondary | ICD-10-CM

## 2023-12-19 DIAGNOSIS — G8929 Other chronic pain: Secondary | ICD-10-CM | POA: Diagnosis not present

## 2023-12-19 MED ORDER — METHYLPREDNISOLONE 4 MG PO TBPK: ORAL_TABLET | ORAL | 0 refills | Status: AC

## 2023-12-19 MED ORDER — NAPROXEN 500 MG PO TABS: 500.0000 mg | ORAL_TABLET | Freq: Two times a day (BID) | ORAL | 3 refills | Status: AC

## 2023-12-19 NOTE — Telephone Encounter (Signed)
 PT has cancelled all apts since Sept 2024.  Patient must be seen in clinic for further refills.

## 2023-12-19 NOTE — Progress Notes (Signed)
 Office Visit Note   Patient: [name]           Date of Birth: 06/12/1950           MRN: [id number] Visit Date: 12/19/2023              Requested by: Vicci Barnie NOVAK, MD [address] [place] [address] [place],  [place] [address] PCP: Vicci Barnie NOVAK, MD   Assessment & Plan: Visit Diagnoses:  1. Chronic midline low back pain without sciatica     Plan: History of Present Illness [name] is a 73 year old female with scoliosis and degenerative disc disease who presents with back pain.  She has experienced recurrent back pain over the past two weeks. Her history includes scoliosis and degenerative disc disease since her twenties, with episodes of her back 'going out,' causing immobility. Recent x-rays at [facility] show scoliosis and bone spurs.  She is under pain management at [facility] and prescribed oxycodone , which she finds too strong even when halved. Previously, a lower dosage of oxycodone  (5 mg) was effective following dental surgery in March.  There is no new weakness in her arms or legs, and no new numbness or tingling sensations.   Exam is nonfocal.  Clinical scoliosis.  Left shoulder elevated compared to the right.  Assessment and Plan Chronic low back pain with lumbar degenerative disc disease and spinal osteophytes, scoliosis Chronic low back pain exacerbated over two weeks. Current oxycodone  regimen too strong. Under pain management contract limiting controlled substances. Discussed alternative pain management strategies. Declined further injections. - Prescribe naproxen  for pain management. - Prescribe a course of steroids for inflammation. - Refer to physical therapy for back muscle strengthening and core stabilization. - Advise to discuss pain medication adjustments with pain management team at [facility]. - Recommend consultation with Duwaine Pouch or Dr. Prentice Masters for a comprehensive back evaluation.  Follow-Up Instructions: No  follow-ups on file.   Orders:  Orders Placed This Encounter  Procedures   Ambulatory referral to Physical Therapy   Meds ordered this encounter  Medications   methylPREDNISolone  (MEDROL  DOSEPAK) 4 MG TBPK tablet    Sig: Take as directed    Dispense:  21 tablet    Refill:  0   naproxen  (NAPROSYN ) 500 MG tablet    Sig: Take 1 tablet (500 mg total) by mouth 2 (two) times daily with a meal.    Dispense:  30 tablet    Refill:  3   Subjective: Chief Complaint  Patient presents with   Lower Back - Pain    HPI  Review of Systems  Constitutional: Negative.   HENT: Negative.    Eyes: Negative.   Respiratory: Negative.    Cardiovascular: Negative.   Endocrine: Negative.   Musculoskeletal: Negative.   Neurological: Negative.   Hematological: Negative.   Psychiatric/Behavioral: Negative.    All other systems reviewed and are negative.    Objective: Vital Signs: There were no vitals taken for this visit.  Physical Exam Vitals and nursing note reviewed.  Constitutional:      Appearance: She is well-developed.  HENT:     Head: Atraumatic.     Nose: Nose normal.  Eyes:     Extraocular Movements: Extraocular movements intact.  Cardiovascular:     Pulses: Normal pulses.  Pulmonary:     Effort: Pulmonary effort is normal.  Abdominal:     Palpations: Abdomen is soft.  Musculoskeletal:     Cervical  back: Neck supple.  Skin:    General: Skin is warm.     Capillary Refill: Capillary refill takes less than 2 seconds.  Neurological:     Mental Status: She is alert. Mental status is at baseline.  Psychiatric:        Behavior: Behavior normal.        Thought Content: Thought content normal.        Judgment: Judgment normal.

## 2024-01-03 ENCOUNTER — Encounter (HOSPITAL_COMMUNITY): Payer: Self-pay | Admitting: Psychiatry

## 2024-01-03 ENCOUNTER — Telehealth (HOSPITAL_BASED_OUTPATIENT_CLINIC_OR_DEPARTMENT_OTHER): Admitting: Psychiatry

## 2024-01-03 VITALS — Wt 125.0 lb

## 2024-01-03 DIAGNOSIS — F4312 Post-traumatic stress disorder, chronic: Secondary | ICD-10-CM | POA: Diagnosis not present

## 2024-01-03 DIAGNOSIS — F1911 Other psychoactive substance abuse, in remission: Secondary | ICD-10-CM

## 2024-01-03 DIAGNOSIS — Z62819 Personal history of unspecified abuse in childhood: Secondary | ICD-10-CM

## 2024-01-03 DIAGNOSIS — G4700 Insomnia, unspecified: Secondary | ICD-10-CM | POA: Diagnosis not present

## 2024-01-03 MED ORDER — ARIPIPRAZOLE 5 MG PO TABS: 5.0000 mg | ORAL_TABLET | Freq: Every day | ORAL | 0 refills | Status: DC

## 2024-01-03 MED ORDER — SERTRALINE HCL 100 MG PO TABS: 100.0000 mg | ORAL_TABLET | Freq: Every day | ORAL | 0 refills | Status: DC

## 2024-01-03 MED ORDER — MIRTAZAPINE 15 MG PO TABS: 15.0000 mg | ORAL_TABLET | Freq: Every day | ORAL | 0 refills | Status: DC
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
## 2024-01-09 ENCOUNTER — Encounter (HOSPITAL_COMMUNITY): Payer: Self-pay | Admitting: Clinical

## 2024-01-09 ENCOUNTER — Other Ambulatory Visit (HOSPITAL_COMMUNITY): Payer: Self-pay | Admitting: Psychiatry

## 2024-01-09 ENCOUNTER — Ambulatory Visit (INDEPENDENT_AMBULATORY_CARE_PROVIDER_SITE_OTHER): Admitting: Clinical

## 2024-01-09 DIAGNOSIS — F1911 Other psychoactive substance abuse, in remission: Secondary | ICD-10-CM | POA: Diagnosis not present

## 2024-01-09 DIAGNOSIS — F4312 Post-traumatic stress disorder, chronic: Secondary | ICD-10-CM

## 2024-01-09 DIAGNOSIS — F330 Major depressive disorder, recurrent, mild: Secondary | ICD-10-CM

## 2024-01-09 DIAGNOSIS — G4700 Insomnia, unspecified: Secondary | ICD-10-CM

## 2024-01-09 DIAGNOSIS — Z62819 Personal history of unspecified abuse in childhood: Secondary | ICD-10-CM

## 2024-01-09 NOTE — Progress Notes (Signed)
 THERAPIST PROGRESS NOTE  Session Time: 1:05pm-2:02pm  Session #19  Virtual Visit via Video Note  I connected with [name] on 01/09/24 at  1:00 PM EDT by a video enabled telemedicine application and verified that I am speaking with the correct person using two identifiers.  Location: Patient: home Provider: home office      I discussed the limitations of evaluation and management by telemedicine and the availability of in person appointments. The patient expressed understanding and agreed to proceed.   I discussed the assessment and treatment plan with the patient. The patient was provided an opportunity to ask questions and all were answered. The patient agreed with the plan and demonstrated an understanding of the instructions.   The patient was advised to call back or seek an in-person evaluation if the symptoms worsen or if the condition fails to improve as anticipated.  I provided 57 minutes of non-face-to-face time during this encounter.     [name], LCSW    Participation Level: Active  Behavioral Response: Casual Alert Euthymic   Type of Therapy: Individual Therapy  Treatment Goals addressed:  LTG: Elimination of maladaptive behaviors and thinking patterns which interfere with resolution of trauma as evidenced by self-report and clinical observation  LTG: Develop and implement effective coping skills to carry out normal responsibilities and participate constructively in relationships as evidenced by self-report  STG: [name] will practice emotion regulation skills 3 time(s) per week for the next 26 week(s)  STG: [name] will practice conflict resolution skills at least 1 times per week for the next 26 weeks STG: [name] will identify negative coping strategies that have been used to cope with the feelings associated with the trauma  STG: [name] will identify coping strategies to deal with trauma memories and the associated emotional reaction  STG: [name] will  identify cognitive patterns and beliefs that support depression LTG: [name] will improve quality of life by maintaining ongoing abstinence from all mood-altering substances LTG: [name] will increase coping skills to promote long-term recovery and improve ability to perform daily activities  STG: [name] will reduce frequency of avoidant behaviors by 50% as evidenced by self-report in therapy sessions  LTG: Score less than 9 on the PHQ-9 and less than 5 on the GAD-7 as evidenced by intermittent administration of the questionnaires to determine progress in managing depression and anxiety. LTG: Increase coping skills to manage depression/anxiety/anger and improve ability to perform daily activities as evidenced by fewer depressive episodes and/or anger outbursts and/or anxious days per self-report and collateral information.  STG: Process life events to the extent needed so that will be able to move forward with various areas of life in a better frame of mind per self-report.  STG: Learn and practice communication techniques such as active listening, I statements, open-ended questions, fair fighting rules, initiating conversations;  learn about boundary types and how to implement/enforce them AEB self-report of use of same.   ProgressTowards Goals: Progressing  Interventions: CBT, Supportive, and Reframing    Summary: [name] is a 73 y.o. female who presents with chronic PTSD, substance use disorders in sustained remission, significant weight loss, and multiple medical issues. She presented oriented x5 and stated she was feeling good, a little chilly.  CSW evaluated patient's medication compliance, use of coping tools, and self-care, as applicable.  She provided an update on various aspects of her life that are normally discussed in therapy, including how long it has been since she talked to each of her  two brothers, news on her grandson's pending birth, and issue regarding former friend in  apartment complex.  Updates were covered and comments provided.  Patient reported satisfaction with recent doctor visit, understood why she cannot be prescribed a requested medicine until after a sleep study is done.  She has not yet picked up the Mirtazapine  that has been ordered, but plans to get it delivered today.  We explored a question she posed about whether she should ever reveal to her son the harsh realities of her childhood, since he loved his grandparents so much.  She does not want to ruin his memories, stated she thinks maybe she should just focus on now and not the past.  CSW encouraged her to trust her instincts as a mother, because they seem to be good, which pleased her greatly.  The benefits and costs of being truthful with him about her difficult childhood were reviewed together and she decided to just continue with the present day focus for now, not to even consider going back unless her son asks questions.  We planned out what she could say if that ever happens, which was, I'm not sure you would want to know, it was a rough childhood and I think we should leave it at that.  She also disclosed feeling that she has been overspending on stuff that she does not need, among which she counts the furniture she is purchasing for $89 monthly from a rental place.  This concerns her now because she has made a commitment to give her son $49 a month toward baby care needs.  CSW encouraged her to think about how much she can afford without sacrificing her necessities.  She has had her brother on her bank account so he could put money in and take repayments out, but she is now thinking about taking him off the account because she is afraid he will take out money for repayments that she is not prepared to make at a particular time.  At one point she borrowed $3,000 from him at 6% interest, now owes $3,350, and is afraid he will just remove money she cannot afford to lose.  Finally CSW talked again about  how avoidance is not good for anybody, and she reminded CSW that she is self-conscious about her appearance thus does not get out.  This was explored and once again her appearance was normalized.  She agreed to think about what was said but said she still hears her mother's voice in her head, 2 years after mom's death and 4 years after she last spoke to mom.  CBT was used to address what is said in her head and she remarked that that's hard work.  CSW provided encouragement and assurances about this process becoming easier over time.  Suicidal/Homicidal: No without intent/plan  Therapist Response:  Patient is progressing AEB engaging in scheduled therapy session.  Throughout the session, CSW gave patient the opportunity to explore thoughts and feelings associated with current life situations and past/present stressors.   CSW challenged patient gently and appropriately to consider different ways of looking at reported issues. CSW encouraged patient's expression of feelings and validated these using empathy, active listening, open body language, and unconditional positive regard.       Plan/Recommendations:  Return to therapy in 2 weeks to next scheduled appointment on 10/7, reflect on what was discussed in session, engage in self care behaviors as explored in session, do homework as assigned (think through cognitive distortion regarding her  appearance, asking the questions we discussed), and return to next session prepared to talk about experience with new coping methods.  Diagnosis:  Chronic post-traumatic stress disorder (PTSD)  Substance abuse in remission (HCC)  Mild episode of recurrent major depressive disorder  Collaboration of Care: Psychiatrist AEB - psychiatrist can read therapy notes; therapist can and does read psychiatric notes prior to sessions   Patient/Guardian was advised Release of Information must be obtained prior to any record release in order to collaborate their care with an  outside provider. Patient/Guardian was advised if they have not already done so to contact the registration department to sign all necessary forms in order for us  to release information regarding their care.   Consent: Patient/Guardian gives verbal consent for treatment and assignment of benefits for services provided during this visit. Patient/Guardian expressed understanding and agreed to proceed.   [name], LCSW 01/09/2024

## 2024-01-15 NOTE — Telephone Encounter (Unsigned)
 Copied from CRM [id number]. Topic: Appointments - Scheduling Inquiry for Clinic >> Jan 05, 2024  4:06 PM Isabell A wrote: Reason for CRM: Patient is requesting a call back in regard to sleep study scheduling. >> Jan 08, 2024  3:00 PM Sherlean HERO wrote: Patient Stated that Dr.Arfeed wants the patient to talk to Tiwanna Tuch .  Stating there was back and forth on patient's Sleep study and there was a referral that was sent to us ? Routing to Deania Siguenza >> Jan 08, 2024  2:51 PM Sherlean HERO wrote: Kijania-Swaringen, Kelly R, CMA    12/05/23  2:05 PM Note Copied from CRM #[id number]. Topic: Clinical - Request for Lab/Test Order >> Dec 04, 2023  2:35 PM Leila BROCKS wrote: Reason for CRM: Patient [phone] would like clarification on where or whom is ordering at home sleep study. Patient would prefer to do through us , patient sees Dr. Neysa. Please advise and call back.       Spoke w/ PT she will have to reach out to    Arfeen, Leni DASEN, MD  Since he's the one who order the test not us .    - NFN

## 2024-01-17 ENCOUNTER — Ambulatory Visit

## 2024-01-19 ENCOUNTER — Telehealth: Payer: Self-pay | Admitting: Acute Care

## 2024-01-19 DIAGNOSIS — Z122 Encounter for screening for malignant neoplasm of respiratory organs: Secondary | ICD-10-CM

## 2024-01-19 DIAGNOSIS — F17210 Nicotine dependence, cigarettes, uncomplicated: Secondary | ICD-10-CM

## 2024-01-19 DIAGNOSIS — Z87891 Personal history of nicotine dependence: Secondary | ICD-10-CM

## 2024-01-19 NOTE — Telephone Encounter (Signed)
 Lung Cancer Screening Narrative/Criteria Questionnaire (Cigarette Smokers Only- No Cigars/Pipes/vapes)   [name]   SDMV:01/29/2024 3:00p [name]        03-Mar-1951   LDCT: 01/30/2024 2:00p GI    73 y.o.   Phone: [phone]  Lung Screening Narrative (confirm age 24-77 yrs Medicare / 50-80 yrs Private pay insurance)   Insurance information:[employer] mcr and mcd   Referring Provider:Dr. Vicci   This screening involves an initial phone call with a team member from our program. It is called a shared decision making visit. The initial meeting is required by  insurance and Medicare to make sure you understand the program. This appointment takes about 15-20 minutes to complete. You will complete the screening scan at your scheduled date/time.  This scan takes about 5-10 minutes to complete. You can eat and drink normally before and after the scan.  Criteria questions for Lung Cancer Screening:   Are you a current or former smoker? Current Age began smoking: 73yo   If you are a former smoker, what year did you quit smoking? N/A(within 15 yrs)   To calculate your smoking history, I need an accurate estimate of how many packs of cigarettes you smoked per day and for how many years. (Not just the number of PPD you are now smoking)   Years smoking 56 x Packs per day 1 = Pack years 56   (at least 20 pack yrs)   (Make sure they understand that we need to know how much they have smoked in the past, not just the number of PPD they are smoking now)  Do you have a personal history of cancer?  No    Do you have a family history of cancer? No  Are you coughing up blood?  No  Have you had unexplained weight loss of 15 lbs or more in the last 6 months? No  It looks like you meet all criteria.  When would be a good time for us  to schedule you for this screening?   Additional information: N/A

## 2024-01-22 ENCOUNTER — Ambulatory Visit

## 2024-01-22 ENCOUNTER — Telehealth: Payer: Self-pay

## 2024-01-22 NOTE — Telephone Encounter (Unsigned)
 Copied from CRM [id number]. Topic: Appointments - Scheduling Inquiry for Clinic >> Jan 22, 2024  3:03 PM Benton KIDD wrote: Patient is asking about the sleep stuidy that her Psychiatry  had ordered but patient is saying she is confused and would like to speak with dr reggy young nurse for further assistance .  [phone] Patient is saying dr arfeen had ordered the sleep study . I see dr arfeen on the 9th this Thursday .

## 2024-01-22 NOTE — Telephone Encounter (Signed)
 Called patient.  Patient states Dr. Arfeen at [facility] wants patient to have a sleep test before he will adjust her sleep medications.  Patient states she is seeing Dr. Arfeen on 01/25/2024.    Patient has an OV with Dr. Neysa tomorrow 01/23/2024 at 3:15 pm but patient had to cancel this appointment due to transportation issues.  Patient rescheduled this for 02/27/2024 with Dr. Neysa.  Patient wants to go ahead and get a home sleep test.  Dr. Neysa, can you move forward and schedule patient a home sleep test?  Please make sure Dr. Curry gets a copy of the results.  Thank you.  Cc to Dr. Curry - Thank you.

## 2024-01-23 ENCOUNTER — Ambulatory Visit: Admitting: Internal Medicine

## 2024-01-23 ENCOUNTER — Ambulatory Visit (HOSPITAL_COMMUNITY): Admitting: Clinical

## 2024-01-23 ENCOUNTER — Encounter (HOSPITAL_COMMUNITY): Payer: Self-pay | Admitting: Clinical

## 2024-01-23 DIAGNOSIS — F1911 Other psychoactive substance abuse, in remission: Secondary | ICD-10-CM

## 2024-01-23 DIAGNOSIS — F4312 Post-traumatic stress disorder, chronic: Secondary | ICD-10-CM

## 2024-01-23 DIAGNOSIS — F330 Major depressive disorder, recurrent, mild: Secondary | ICD-10-CM | POA: Diagnosis not present

## 2024-01-23 NOTE — Telephone Encounter (Signed)
 Order- home sleep test  dx OSA       Copy of report also to Dr Curry.  She can call us  2 weeks after test for results and recommendations.

## 2024-01-23 NOTE — Progress Notes (Unsigned)
 THERAPIST PROGRESS NOTE  Session Time: 1:00pm-2:00pm  Session #20  Virtual Visit via Video Note  I connected with [name] on 01/23/24 at  1:00 PM EDT by a video enabled telemedicine application and verified that I am speaking with the correct person using two identifiers.  Location: Patient: home Provider: home office      I discussed the limitations of evaluation and management by telemedicine and the availability of in person appointments. The patient expressed understanding and agreed to proceed.   I discussed the assessment and treatment plan with the patient. The patient was provided an opportunity to ask questions and all were answered. The patient agreed with the plan and demonstrated an understanding of the instructions.   The patient was advised to call back or seek an in-person evaluation if the symptoms worsen or if the condition fails to improve as anticipated.  I provided 60 minutes of non-face-to-face time during this encounter.     [name], LCSW    Participation Level: Active  Behavioral Response: Casual Alert Negative   Type of Therapy: Individual Therapy  Treatment Goals addressed:  New treatment goals established, current goals reviewed: ***   ProgressTowards Goals: Progressing  Interventions: Supportive ***    Summary: [name] is a 73 y.o. female who presents with chronic PTSD, substance use disorders in sustained remission, significant weight loss, and multiple medical issues. She presented oriented x5 and stated she was feeling ***.  CSW evaluated patient's medication compliance, use of coping tools, and self-care, as applicable.  She provided an update on various aspects of her life that are normally discussed in therapy, including ***  Suicidal/Homicidal: No without intent/plan  Therapist Response:  Patient is progressing AEB engaging in scheduled therapy session.  Throughout the session, CSW gave patient the opportunity  to explore thoughts and feelings associated with current life situations and past/present stressors.   CSW challenged patient gently and appropriately to consider different ways of looking at reported issues. CSW encouraged patient's expression of feelings and validated these using empathy, active listening, open body language, and unconditional positive regard.       Plan/Recommendations:  Return to therapy in 2 weeks to next scheduled appointment on 10/21, reflect on what was discussed in session, engage in self care behaviors as explored in session, do homework as assigned (***, and return to next session prepared to talk about experience with new coping methods.  Diagnosis:  Chronic post-traumatic stress disorder (PTSD)  Substance abuse in remission (HCC)  Mild episode of recurrent major depressive disorder  Collaboration of Care: Psychiatrist AEB - psychiatrist can read therapy notes; therapist can and does read psychiatric notes prior to sessions   Patient/Guardian was advised Release of Information must be obtained prior to any record release in order to collaborate their care with an outside provider. Patient/Guardian was advised if they have not already done so to contact the registration department to sign all necessary forms in order for us  to release information regarding their care.   Consent: Patient/Guardian gives verbal consent for treatment and assignment of benefits for services provided during this visit. Patient/Guardian expressed understanding and agreed to proceed.   [name], LCSW 01/23/2024

## 2024-01-25 ENCOUNTER — Telehealth (HOSPITAL_COMMUNITY): Admitting: Psychiatry

## 2024-01-25 ENCOUNTER — Encounter (HOSPITAL_COMMUNITY): Payer: Self-pay | Admitting: Psychiatry

## 2024-01-25 VITALS — Wt 128.0 lb

## 2024-01-25 DIAGNOSIS — F4312 Post-traumatic stress disorder, chronic: Secondary | ICD-10-CM

## 2024-01-25 DIAGNOSIS — Z62819 Personal history of unspecified abuse in childhood: Secondary | ICD-10-CM | POA: Diagnosis not present

## 2024-01-25 DIAGNOSIS — F1911 Other psychoactive substance abuse, in remission: Secondary | ICD-10-CM

## 2024-01-25 MED ORDER — SERTRALINE HCL 100 MG PO TABS: 100.0000 mg | ORAL_TABLET | Freq: Every day | ORAL | 1 refills | Status: DC

## 2024-01-25 MED ORDER — OLANZAPINE 5 MG PO TABS: 5.0000 mg | ORAL_TABLET | Freq: Every day | ORAL | 1 refills | Status: DC

## 2024-01-25 MED ORDER — TRAZODONE HCL 100 MG PO TABS: 100.0000 mg | ORAL_TABLET | Freq: Every evening | ORAL | 0 refills | Status: DC | PRN
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
## 2024-01-26 ENCOUNTER — Other Ambulatory Visit: Payer: Self-pay

## 2024-01-26 DIAGNOSIS — G4733 Obstructive sleep apnea (adult) (pediatric): Secondary | ICD-10-CM

## 2024-01-26 NOTE — Telephone Encounter (Signed)
 Called patient.  Gave information.  Ordered HST as orders only encounter.  Patient verbalized understanding.

## 2024-01-29 ENCOUNTER — Ambulatory Visit: Admitting: *Deleted

## 2024-01-29 DIAGNOSIS — F17210 Nicotine dependence, cigarettes, uncomplicated: Secondary | ICD-10-CM

## 2024-01-29 NOTE — Progress Notes (Unsigned)
 Virtual Visit via Telephone Note  I connected with [name] on 01/29/24 at  3:00 PM EDT by telephone and verified that I am speaking with the correct person using two identifiers.  Location: Patient: in home Provider: [address] [place], [place], [address]    Shared Decision Making Visit Lung Cancer Screening Program [phone])   Eligibility: Age 73 y.o. Pack Years Smoking History Calculation 56 (# packs/per year x # years smoked) Recent History of coughing up blood  no Unexplained weight loss? no ( >Than 15 pounds within the last 6 months ) Prior History Lung / other cancer no (Diagnosis within the last 5 years already requiring surveillance chest CT Scans). Smoking Status Current Smoker Former Smokers: Years since quit: NA  Quit Date: NA  Visit Components: Discussion included one or more decision making aids. yes Discussion included risk/benefits of screening. yes Discussion included potential follow up diagnostic testing for abnormal scans. yes Discussion included meaning and risk of over diagnosis. yes Discussion included meaning and risk of False Positives. yes Discussion included meaning of total radiation exposure. yes  Counseling Included: Importance of adherence to annual lung cancer LDCT screening. yes Impact of comorbidities on ability to participate in the program. yes Ability and willingness to under diagnostic treatment. yes  Smoking Cessation Counseling: Current Smokers:  Discussed importance of smoking cessation. yes Information about tobacco cessation classes and interventions provided to patient. yes Patient provided with ticket for LDCT Scan. yes Symptomatic Patient. no  Counseling NA Diagnosis Code: Tobacco Use Z72.0 Asymptomatic Patient yes  Counseling (Intermediate counseling: > three minutes counseling) [id number]  Counseled patient 4 minutes regarding tobacco use.   Former Smokers:  Discussed the importance of maintaining cigarette  abstinence. yes Diagnosis Code: Personal History of Nicotine  Dependence. S12.108 Information about tobacco cessation classes and interventions provided to patient. Yes Patient provided with ticket for LDCT Scan. yes Written Order for Lung Cancer Screening with LDCT placed in Epic. Yes (CT Chest Lung Cancer Screening Low Dose W/O CM) [id number] Z12.2-Screening of respiratory organs Z87.891-Personal history of nicotine  dependence   Josette Ranger, RN 01/29/24

## 2024-01-29 NOTE — Patient Instructions (Signed)

## 2024-01-30 ENCOUNTER — Other Ambulatory Visit

## 2024-02-01 ENCOUNTER — Telehealth (HOSPITAL_COMMUNITY): Admitting: Psychiatry

## 2024-02-05 ENCOUNTER — Other Ambulatory Visit: Payer: Self-pay | Admitting: Adult Health

## 2024-02-06 ENCOUNTER — Encounter (HOSPITAL_COMMUNITY): Payer: Self-pay | Admitting: Clinical

## 2024-02-06 ENCOUNTER — Ambulatory Visit (INDEPENDENT_AMBULATORY_CARE_PROVIDER_SITE_OTHER): Admitting: Clinical

## 2024-02-06 DIAGNOSIS — F1091 Alcohol use, unspecified, in remission: Secondary | ICD-10-CM

## 2024-02-06 DIAGNOSIS — F1491 Cocaine use, unspecified, in remission: Secondary | ICD-10-CM | POA: Diagnosis not present

## 2024-02-06 DIAGNOSIS — F1911 Other psychoactive substance abuse, in remission: Secondary | ICD-10-CM | POA: Diagnosis not present

## 2024-02-06 DIAGNOSIS — F330 Major depressive disorder, recurrent, mild: Secondary | ICD-10-CM | POA: Diagnosis not present

## 2024-02-06 DIAGNOSIS — Z62819 Personal history of unspecified abuse in childhood: Secondary | ICD-10-CM

## 2024-02-06 DIAGNOSIS — G4700 Insomnia, unspecified: Secondary | ICD-10-CM | POA: Diagnosis not present

## 2024-02-06 DIAGNOSIS — F4312 Post-traumatic stress disorder, chronic: Secondary | ICD-10-CM | POA: Diagnosis not present

## 2024-02-06 NOTE — Progress Notes (Signed)
 THERAPIST PROGRESS NOTE  Session Time: 1:02pm-2:00***pm  Session #21  Virtual Visit via Video Note  I connected with [name] on 02/06/24 at  1:00 PM EDT by a video enabled telemedicine application and verified that I am speaking with the correct person using two identifiers.  Location: Patient: home Provider: home office      I discussed the limitations of evaluation and management by telemedicine and the availability of in person appointments. The patient expressed understanding and agreed to proceed.   I discussed the assessment and treatment plan with the patient. The patient was provided an opportunity to ask questions and all were answered. The patient agreed with the plan and demonstrated an understanding of the instructions.   The patient was advised to call back or seek an in-person evaluation if the symptoms worsen or if the condition fails to improve as anticipated.  I provided *** minutes of non-face-to-face time during this encounter.     [name], LCSW    Participation Level: Active  Behavioral Response: Casual Alert Negative   Type of Therapy: Individual Therapy  Treatment Goals addressed:  LTG: Elimination of maladaptive behaviors and thinking patterns which interfere with resolution of trauma as evidenced by self-report and clinical observation  LTG: Develop and implement effective coping skills to carry out normal responsibilities and participate constructively in relationships as evidenced by self-report  LTG: Score less than 9 on the PHQ-9 and less than 5 on the GAD-7 as evidenced by intermittent administration of the questionnaires to determine progress in managing depression and anxiety.  LTG: Increase coping skills to manage depression/anxiety/anger and improve ability to perform daily activities as evidenced by fewer depressive episodes and/or anger outbursts and/or anxious days per self-report and collateral information.  STG: Learn and  practice communication techniques such as active listening, I statements, open-ended questions, fair fighting rules, initiating conversations;  learn about boundary types and how to implement/enforce them AEB self-report of use of same. STG: Process life events to the extent needed so that will be able to move forward with various areas of life in a better frame of mind per self-report of improved satisfaction with life 5 out of 7 days over the next 6 months.  LTG: Ellan will continue to enjoy her improved quality of life by maintaining ongoing abstinence from all mood-altering substances, specifically alcohol  and cocaine, for the next year, one day at a time.  STG: Learn emotion regulation strategies, distress tolerance skills, interpersonal effectiveness techniques, and mindfulness practices and use them in session and in life situations to improve results and satisfaction with life 5 days out of 7.  STG: Decrease cognitive distortions contributing negatively to mood and behavior by identifying 5-7 mind traps that are present; learn how to come up with replacement houghts that are more balanced/realistic/helpful; explore core beliefs & their effect  STG: Learn a variety of breathing techniques and grounding strategies, practice in session then report independent application out of session 2-4 times per month or more often, if needed.  STG: Gain insight into shame, learn coping skills, and increase resilience through processing of life in a shame framework. LTG: Identify elements of personal relapse prevention plan, assisted by making lists of triggers, warning signs, risk factors, and coping skills to prevent relapse 7 days out of 7 over the next 365 days.  ProgressTowards Goals: Progressing  Interventions: Supportive and Other: shame work and relapse prevention     Summary: [name] is a 73 y.o. female who  presents with chronic PTSD, substance use disorders in sustained remission, significant  weight loss, and multiple medical issues. She presented oriented x5 and stated she was feeling okay.  CSW evaluated patient's medication compliance, use of coping tools, and self-care, as applicable.  She provided an update on various aspects of her life that are normally discussed in therapy, including ***  Suicidal/Homicidal: No without intent/plan  Therapist Response:  Patient is progressing AEB engaging in scheduled therapy session.  Throughout the session, CSW gave patient the opportunity to explore thoughts and feelings associated with current life situations and past/present stressors.   CSW challenged patient gently and appropriately to consider different ways of looking at reported issues. CSW encouraged patient's expression of feelings and validated these using empathy, active listening, open body language, and unconditional positive regard.       Plan/Recommendations:  Return to therapy in 2 weeks to next scheduled appointment on 11/5, reflect on what was discussed in session, engage in self care behaviors as explored in session, do homework as assigned (check mail for envelope sent by CSW, develop a relapse prevention plan), and return to next session prepared to talk about experience with new coping methods.  Diagnosis:  Chronic post-traumatic stress disorder (PTSD)  Trauma in childhood  Substance abuse in remission (HCC)  Insomnia, unspecified type  Mild episode of recurrent major depressive disorder  Cocaine use disorder in remission  Alcohol  use disorder in remission  Collaboration of Care: Psychiatrist AEB - psychiatrist can read therapy notes; therapist can and does read psychiatric notes prior to sessions   Patient/Guardian was advised Release of Information must be obtained prior to any record release in order to collaborate their care with an outside provider. Patient/Guardian was advised if they have not already done so to contact the registration department to sign  all necessary forms in order for us  to release information regarding their care.   Consent: Patient/Guardian gives verbal consent for treatment and assignment of benefits for services provided during this visit. Patient/Guardian expressed understanding and agreed to proceed.   [name], LCSW 02/06/2024

## 2024-02-11 ENCOUNTER — Encounter

## 2024-02-11 DIAGNOSIS — G4733 Obstructive sleep apnea (adult) (pediatric): Secondary | ICD-10-CM

## 2024-02-13 ENCOUNTER — Ambulatory Visit (HOSPITAL_COMMUNITY): Admitting: Clinical

## 2024-02-19 ENCOUNTER — Encounter: Payer: Self-pay | Admitting: Radiology

## 2024-02-20 NOTE — Progress Notes (Signed)
 "   CHIEF COMPLAINT Chief Complaint  Patient presents with   Uveitis      HISTORY OF PRESENT ILLNESS: [name] is a 73 y.o. y.o. old female who presents to the clinic today for delayed 4 month follow up for peripheral focal chorioretinal inflammation of both eyes. Initially referred by Dr. Anthon. 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 She is s/p YAG OD 08/07/20 and YAG OS 07/01/20 with Dr. Caresse. Currently on Azathioprine  50 mg daily.  Today the patient reports she has missed her follow-up since she was sick. She notes that she has been out of Azathioprine  1 week.   She denies other concerns at this time.  Denies any fevers, weight loss, night sweats.   Review of Systems:   Constitutional symptoms: negative Eyes:  negative Ear, nose, throat:  negative Cardiovascular:  negative Respiratory:  negative Gastrointestinal:  negative Genitourinary:  negative Skin:  negative Neurological:  negative Musculoskeletal:  negative Psychiatric:  negative Endocrine:  negative Hematological:  negative Allergic:  negative   Selected notes from the MEDICAL RECORD NUMBER     CURRENT DROPS: See med list  Referring physician: No referring provider defined for this encounter.  REVIEW OF SYSTEMS: See tech note  MEDICATIONS Current Outpatient Medications  Medication Sig Dispense Refill   ARIPiprazole  (ABILIFY ) 2 mg tablet Take 2 mg by mouth daily.     atropine (Isopto Atropine) 1 % ophthalmic solution Administer 1 drop into left eye 2 (two) times a day for 5 days. 2 mL 0   azaTHIOprine  (IMURAN ) 50 mg tablet Take 1 tablet (50 mg total) by mouth daily. 90 tablet 3   Breztri  Aerosphere 160-9-4.8 mcg/actuation HFAA Inhale 2 puffs 2 (two) times a day.     cycloSPORINE (Restasis) 0.05 % ophthalmic emulsion Administer 1 drop into both eyes 2 (two) times a day. Restasis in vials not multi dose bottle 180 each 11   estradioL   (ESTRACE ) 2 mg tablet Take 2 mg by mouth.     fluticasone  propionate (FLONASE ) 50 mcg/spray nasal spray Administer 2 sprays into each nostril Once Daily. 48 g 3   lamoTRIgine  (LaMICtal ) 100 mg tablet Take 300 mg by mouth nightly.     lifitegrast (XIIDRA) 5 % dpet ophthalmic solution Administer 1 drop into each eyes 2 (two) times a day. 180 each 3   multivitamin with minerals (Daily Multivitamin-Minerals) tab Take  by mouth.     oxyCODONE  (ROXICODONE ) 15 mg immediate release tablet Take 15 mg by mouth every 6 (six) hours as needed.     sertraline  (ZOLOFT ) 100 mg tablet Take 100 mg by mouth daily.     simvastatin  (ZOCOR ) 40 mg tablet Take 40 mg by mouth nightly.     zolpidem  (AMBIEN ) 5 mg tablet      No current facility-administered medications for this visit.     ALLERGIES Allergies  Allergen Reactions   Haloperidol Anaphylaxis   Oxybutynin Chloride Other (See Comments)    TROUBLE SWALLOWING   Adhesive Rash    Adhesive tape--itching    PAST MEDICAL HISTORY Past Surgical History:  Procedure Laterality Date   ABDOMINAL SURGERY      Procedure: ABDOMINAL SURGERY   BRACHIAL NERVE REPAIR  04/28/2011   Procedure: NEUROLYSIS BRACHIAL PLEXUS;  Surgeon: Janas Norleen Cowing, MD;  Location: [facility] OUTPATIENT OR;  Service: Orthopedics;  Laterality: Right;  suprascapular nerve decompression, lateral position nerve stimulator   CATARACT EXTRACTION     Procedure:  CATARACT EXTRACTION   CHOLECYSTECTOMY     CYSTOSCOPY     Procedure: CYSTOSCOPY   CYSTOSCOPY W/ DILATION OF BLADDER N/A 09/27/2013   Procedure: CYSTOSCOPY W/ HYDRODISTENTION / BOTOX INJECTION;  Surgeon: Lamar Lynwood Sar, MD;  [name] OUTPATIENT OR;  Service: Urology;  Laterality: N/A;  Coloplast needle for Botox injection.   CYSTOSCOPY W/ DILATION OF BLADDER N/A 10/11/2019   Procedure: CYSTOSCOPY W/ HYDRODISTENTION;  Surgeon: Lamar Lynwood Sar, MD;  Location: [facility] MINOR PROC;  Service: Urology;  Laterality: N/A;   FOOT  SURGERY Left    Procedure: FOOT SURGERY   HYSTERECTOMY      Procedure: HYSTERECTOMY   INTERSTIM GENERATOR PLACEMENT     Procedure: INTERSTIM GENERATOR PLACEMENT   INTERSTIM IMPLANT REMOVAL  01/20/2012   Procedure: RENNA IMPLANT REMOVAL;  Surgeon: Lamar Lynwood Sar, MD;  Location: [facility] OUTPATIENT OR;  Service: Urology;  Laterality: N/A;   INTERSTIM LEAD PLACEMENT     Procedure: INTERSTIM LEAD PLACEMENT   SHOULDER DEBRIDEMENT  05/14/2011   Procedure: IRRIGATION & DEBRIDEMENT SHOULDER;  Surgeon: Norleen Canton, MD;  Location: [facility] MAIN OR;  Service: Orthopedics;  Laterality: Right;  Lateral on bean bag, reg table, cysto tubing, culture swabs , ortho minor set    FAMILY HISTORY Family History  Adopted: Yes  Problem Relation Name Age of Onset   Heart disease Father     Heart disease Mother       SOCIAL HISTORY Social History   Socioeconomic History   Marital status: Divorced    Spouse name: Not on file   Number of children: Not on file   Years of education: Not on file   Highest education level: Not on file  Occupational History   Not on file  Tobacco Use   Smoking status: Former    Current packs/day: 0.00    Types: Cigarettes    Quit date: 04/17/2019    Years since quitting: 4.8   Smokeless tobacco: Never  Substance and Sexual Activity   Alcohol  use: No   Drug use: Unknown    Frequency: 1.0 times per week    Types: Crack cocaine    Comment: Drug use: Drug Use: Yes; 2019.   Sexual activity: Not on file  Other Topics Concern   Not on file  Social History Narrative   Not on file   Social Drivers of Health   Food Insecurity: No Food Insecurity (11/03/2023)   Received from [facility]   Food vital sign    Within the past 12 months, you worried that your food would run out before you got money to buy more: Never true    Within the past 12 months, the food you bought just didn't last and you didn't have money to get more: Never true  Transportation  Needs: No Transportation Needs (11/03/2023)   Received from [facility] - Transportation    In the past 12 months, has lack of transportation kept you from medical appointments or from getting medications?: No    In the past 12 months, has lack of transportation kept you from meetings, work, or from getting things needed for daily living?: No  Safety: Not At Risk (11/03/2023)   Received from [facility]   Safety    Within the last year, have you been afraid of your partner or ex-partner?: No    Within the last year, have you been humiliated or emotionally abused in other ways by your partner or ex-partner?: No    Within  the last year, have you been kicked, hit, slapped, or otherwise physically hurt by your partner or ex-partner?: No    Within the last year, have you been raped or forced to have any kind of sexual activity by your partner or ex-partner?: No  Living Situation: Low Risk  (11/03/2023)   Received from [facility] Situation    In the last 12 months, was there a time when you were not able to pay the mortgage or rent on time?: No    In the past 12 months, how many times have you moved where you were living?: 0    At any time in the past 12 months, were you homeless or living in a shelter (including now)?: No       OPHTHALMIC EXAM: Base Eye Exam     Visual Acuity (Snellen - Linear)       Right Left   Dist cc 20/25 20/25 +2         Tonometry (Tonopen: Tetracaine  OU, 1:31 PM)       Right Left   Pressure 13 16         Pupils       APD   Right None   Left None         Visual Fields       Left Right    Full Full         Extraocular Movement       Right Left    Full Full         Neuro/Psych     Oriented x3: Yes   Mood/Affect: Normal         Dilation     Both eyes: 2.5% Phenylephrine , 1.0% Tropicamide @ 1:31 PM           Slit Lamp and Fundus Exam     External Exam       Right Left   External Normal  Normal         Slit Lamp Exam       Right Left   Lids/Lashes MGD, punctal plug; papilloma LL centrally MGD, punctal plug    Conjunctiva/Sclera White and quiet White and quiet   Cornea Clear Clear, no KP   Anterior Chamber Deep and quiet, no cell/flare Deep and quiet, no cell/flare   Iris Round and reactive Round and reactive, no TID   Lens PCIOL, Open PC, silicone lens PCIOL, open PC    Anterior Vitreous Vitreous syneresis Vitreous syneresis, 1+ vit haze, vit floaters, partial PVD         Fundus Exam       Right Left   Disc Normal Normal, pink and sharp   C/D Ratio 0.3 0.3   Macula Normal, no retinal edema Normal, no retinal edema    Vessels Normal Normal   Periphery Normal Normal            LABS No visits with results within 4 Week(s) from this visit.  Latest known visit with results is:  Lab on 11/03/2021  Component Date Value   HX SODIUM 11/03/2021 140    HX POTASSIUM 11/03/2021 4.2    HX CHLORIDE 11/03/2021 110    HX CO2 11/03/2021 26    HX BUN 11/03/2021 10    HX GLUCOSE 11/03/2021 97    HX CREATININE 11/03/2021 0.83    HX CALCIUM  11/03/2021 9.0    HX TOTAL PROTEIN 11/03/2021 6.5    HX ALBUMIN 11/03/2021 4.4  HX BILIRUBIN TOTAL 11/03/2021 0.5    HX ALKALINE PHOSPHATASE 11/03/2021 55    HX AST (SGOT) 11/03/2021 9 (L)    HX ALT (SGPT) 11/03/2021 8    HX ANION GAP (WC) 11/03/2021 4    HX EST. GFR 11/03/2021 76    HX WBC 11/03/2021 5.9    HX RBC 11/03/2021 3.72 (L)    HX HGB 11/03/2021 14.2    HX HCT 11/03/2021 39.1    HX MCV 11/03/2021 105.2 (H)    HX MCH 11/03/2021 38.3 (H)    HX MCHC 11/03/2021 36.4    HX RDW 11/03/2021 15.9    HX PLT 11/03/2021 233    HX MPV 11/03/2021 7.4    HX NEUTROPHIL % 11/03/2021 66    HX LYMPHOCYTE% 11/03/2021 25    HX MONOCYTE% 11/03/2021 7    HX EOSINOPHIL% 11/03/2021 2    HX BASOPHIL % 11/03/2021 1    HX NEUTROPHIL ABSOLUTE C* 11/03/2021 3.9    HX LYMPHOCYTE ABSOLUTE C* 11/03/2021  1.5    HX MONOCYTE ABSOLUTE COU* 11/03/2021 0.4    HX EOSINOPHIL ABSOLUTE C* 11/03/2021 0.1    HX BASOPHIL ABSOLUTE COU* 11/03/2021 0.0    HX NRBC 11/03/2021 0      IMAGING   Imaging  Testing report: Optical coherence tomography Date obtained - 02/20/2024 Indication for imaging: Macular pucker, left eye Technically adequate study. Patient compliance high.    Right Eye:  Central foveal thickness: 278 Findings: NFD Comparison to previous: stable    Left Eye:  Central foveal thickness: 282 Findings: NFD, mild ERM Comparison to previous: stable    Diagnosis / Impression: mild ERM OS  Clinical management:  See below   Abbreviations: NFP--Normal foveal profile. Normal OCT. CME--cystoid macular edema. PED--pigment epithelial detachment. SRF--Subretinal fluid.  EZ --ellipsoid zone. ERM Epiretinal membrane.. ORT - outer retinal tubulation. SRHM - subretinal hyper-reflective material     ASSESSMENT/PLAN:   1. Macular pucker, left eye  OCT, Macula - OU - Both Eyes    2. Peripheral focal chorioretinal inflammation of both eyes  azaTHIOprine  (IMURAN ) 50 mg tablet   Comprehensive Metabolic Panel   CBC with Differential    3. Nuclear sclerotic cataract of both eyes      4. High risk medication use  azaTHIOprine  (IMURAN ) 50 mg tablet   Comprehensive Metabolic Panel   CBC with Differential       1. Macular pucker, left eye - I have discussed the nature and mechanism. I have discussed that the superficial scar tissue is affecting her vision. I will recommend observation at this time.    2. Peripheral focal chorioretinal inflammation of both eyes - I have discussed the vision threatening nature/potentially blinding disease which requires chemotherapy/immune suppression. I have discussed in great detail the medication along with reviewing all the systemic implications.  - Currently on 50 mg Azathioprine  daily - increased to 100 mg daily on 01/14/20 - Finished oral Prednisone   taper  - Increased Azathioprine  100 mg daily to 150 mg daily on 12/01/20 - Patient was lost to follow-up. I have stressed importance of appointment compliance (10/25/22) - Of note, mOCT down on 10/25/22 - Decreased Azathioprine  150 mg daily to 50 mg daily on 10/25/22 - She has been adherent with follow-up. Her eyes are quiet. 02/20/2024 - Continue Azathioprine  50 mg daily  - Continue Restasis BID OU  - Recommend hot compresses and ATs for dryness  - Follow up in 4 months for reevaluation    3. Pseudophakia  of both eyes - s/p YAG OD 08/07/20 and YAG OS 07/01/20 with Dr. Caresse   4. High risk medication use - I have assessed no occult side-effects to medication  - High risk labs drawn on 11/03/21      =====  Paticia Fairly, MD  Diseases and Surgery of the Retina and Vitreous and  Uveitis and Ocular Immunology Assistant Professor of Ophthalmology Department of Ophthalmology  [facility]   Requested Prescriptions   Signed Prescriptions Disp Refills   azaTHIOprine  (IMURAN ) 50 mg tablet 90 tablet 3    Sig: Take 1 tablet (50 mg total) by mouth daily.   cycloSPORINE (Restasis) 0.05 % ophthalmic emulsion 180 each 11    Sig: Administer 1 drop into both eyes 2 (two) times a day. Restasis in vials not multi dose bottle        Return in about 4 months (around 06/19/2024).     Abbreviations DME diabetic macular edema; PDR proliferative diabetic retinopathy; NPDR non proliferative diabetic retinopathy;  BRVO  Branch retinal vein occlusion; CRVO central retinal vein occlusion; ; RD retinal detachment; RT retinal tear; SB scleral buckle; PPV pars plana vitrectomy; VH  Vitreous hemorrhage; PRP  panretinal laser photocoagulation; IVK intravitreal kenalog ; IVA intravitreal avastin; IVL intravitreal Lucentis; IVE Intravitreal Eylea; PVD posterior vitreous detachment; OD right eye; OS left eye; OU  Both eyes;  VMT  vitreomacular traction; MH  Macular hole; ERM epiretinal  membrane; NVD neovascularization of the disc; NVE neovascularization elsewhere; AREDS age related eye disease study   "

## 2024-02-21 ENCOUNTER — Telehealth (HOSPITAL_COMMUNITY): Payer: Self-pay | Admitting: *Deleted

## 2024-02-21 ENCOUNTER — Encounter (HOSPITAL_COMMUNITY): Payer: Self-pay | Admitting: Clinical

## 2024-02-21 ENCOUNTER — Ambulatory Visit (INDEPENDENT_AMBULATORY_CARE_PROVIDER_SITE_OTHER): Admitting: Clinical

## 2024-02-21 ENCOUNTER — Other Ambulatory Visit (HOSPITAL_COMMUNITY): Payer: Self-pay | Admitting: *Deleted

## 2024-02-21 DIAGNOSIS — F4312 Post-traumatic stress disorder, chronic: Secondary | ICD-10-CM | POA: Diagnosis not present

## 2024-02-21 DIAGNOSIS — Z62819 Personal history of unspecified abuse in childhood: Secondary | ICD-10-CM

## 2024-02-21 DIAGNOSIS — F1091 Alcohol use, unspecified, in remission: Secondary | ICD-10-CM | POA: Diagnosis not present

## 2024-02-21 DIAGNOSIS — F1491 Cocaine use, unspecified, in remission: Secondary | ICD-10-CM

## 2024-02-21 DIAGNOSIS — F330 Major depressive disorder, recurrent, mild: Secondary | ICD-10-CM | POA: Diagnosis not present

## 2024-02-21 DIAGNOSIS — F1911 Other psychoactive substance abuse, in remission: Secondary | ICD-10-CM

## 2024-02-21 MED ORDER — SERTRALINE HCL 100 MG PO TABS: 100.0000 mg | ORAL_TABLET | Freq: Every day | ORAL | 0 refills | Status: DC

## 2024-02-21 NOTE — Telephone Encounter (Signed)
 Done

## 2024-02-21 NOTE — Telephone Encounter (Signed)
 Pt is requesting a 90 day fill of the Zoloft  100 mg tabs every day.   Last visit: 01/24/24 Next visit: 04/26/24

## 2024-02-22 ENCOUNTER — Other Ambulatory Visit (HOSPITAL_COMMUNITY): Payer: Self-pay | Admitting: Psychiatry

## 2024-02-22 ENCOUNTER — Encounter (HOSPITAL_COMMUNITY): Payer: Self-pay | Admitting: Clinical

## 2024-02-22 ENCOUNTER — Ambulatory Visit
Admission: RE | Admit: 2024-02-22 | Discharge: 2024-02-22 | Disposition: A | Discharge status: Home / Self Care | Source: Ambulatory Visit | Attending: Acute Care | Admitting: Acute Care

## 2024-02-22 DIAGNOSIS — F17210 Nicotine dependence, cigarettes, uncomplicated: Secondary | ICD-10-CM

## 2024-02-22 DIAGNOSIS — F4312 Post-traumatic stress disorder, chronic: Secondary | ICD-10-CM

## 2024-02-22 DIAGNOSIS — Z62819 Personal history of unspecified abuse in childhood: Secondary | ICD-10-CM

## 2024-02-22 DIAGNOSIS — F1911 Other psychoactive substance abuse, in remission: Secondary | ICD-10-CM

## 2024-02-22 DIAGNOSIS — Z87891 Personal history of nicotine dependence: Secondary | ICD-10-CM

## 2024-02-22 DIAGNOSIS — Z122 Encounter for screening for malignant neoplasm of respiratory organs: Secondary | ICD-10-CM

## 2024-02-27 ENCOUNTER — Ambulatory Visit: Admitting: Internal Medicine

## 2024-02-28 ENCOUNTER — Other Ambulatory Visit: Payer: Self-pay

## 2024-02-28 ENCOUNTER — Telehealth: Payer: Self-pay | Admitting: Internal Medicine

## 2024-02-28 DIAGNOSIS — Z87891 Personal history of nicotine dependence: Secondary | ICD-10-CM

## 2024-02-28 DIAGNOSIS — Z122 Encounter for screening for malignant neoplasm of respiratory organs: Secondary | ICD-10-CM

## 2024-02-28 DIAGNOSIS — F17210 Nicotine dependence, cigarettes, uncomplicated: Secondary | ICD-10-CM

## 2024-02-28 NOTE — Telephone Encounter (Signed)
 Confirmed appt for 11/13

## 2024-02-29 ENCOUNTER — Ambulatory Visit: Admitting: Internal Medicine

## 2024-03-01 DIAGNOSIS — G4733 Obstructive sleep apnea (adult) (pediatric): Secondary | ICD-10-CM | POA: Diagnosis not present

## 2024-03-07 ENCOUNTER — Ambulatory Visit: Admitting: Internal Medicine

## 2024-03-12 ENCOUNTER — Ambulatory Visit (HOSPITAL_COMMUNITY): Admitting: Clinical

## 2024-03-19 ENCOUNTER — Encounter

## 2024-03-19 ENCOUNTER — Other Ambulatory Visit (HOSPITAL_COMMUNITY): Payer: Self-pay | Admitting: Psychiatry

## 2024-03-19 DIAGNOSIS — Z62819 Personal history of unspecified abuse in childhood: Secondary | ICD-10-CM

## 2024-03-19 DIAGNOSIS — F4312 Post-traumatic stress disorder, chronic: Secondary | ICD-10-CM

## 2024-03-22 ENCOUNTER — Other Ambulatory Visit (HOSPITAL_COMMUNITY): Payer: Self-pay | Admitting: *Deleted

## 2024-03-22 DIAGNOSIS — F4312 Post-traumatic stress disorder, chronic: Secondary | ICD-10-CM

## 2024-03-22 DIAGNOSIS — F1911 Other psychoactive substance abuse, in remission: Secondary | ICD-10-CM

## 2024-03-22 DIAGNOSIS — Z62819 Personal history of unspecified abuse in childhood: Secondary | ICD-10-CM

## 2024-03-22 MED ORDER — OLANZAPINE 5 MG PO TABS: 5.0000 mg | ORAL_TABLET | Freq: Every day | ORAL | 0 refills | Status: DC

## 2024-03-22 MED ORDER — TRAZODONE HCL 100 MG PO TABS: 100.0000 mg | ORAL_TABLET | Freq: Every evening | ORAL | 0 refills | Status: DC | PRN

## 2024-03-25 ENCOUNTER — Ambulatory Visit

## 2024-03-25 VITALS — BP 122/70 | HR 72 | Temp 97.0°F | Ht 65.0 in | Wt 129.0 lb

## 2024-03-25 DIAGNOSIS — F17210 Nicotine dependence, cigarettes, uncomplicated: Secondary | ICD-10-CM | POA: Diagnosis not present

## 2024-03-25 DIAGNOSIS — G4733 Obstructive sleep apnea (adult) (pediatric): Secondary | ICD-10-CM | POA: Diagnosis not present

## 2024-03-25 DIAGNOSIS — J432 Centrilobular emphysema: Secondary | ICD-10-CM | POA: Diagnosis not present

## 2024-03-25 DIAGNOSIS — Z716 Tobacco abuse counseling: Secondary | ICD-10-CM

## 2024-03-25 DIAGNOSIS — J301 Allergic rhinitis due to pollen: Secondary | ICD-10-CM

## 2024-03-25 DIAGNOSIS — G4700 Insomnia, unspecified: Secondary | ICD-10-CM

## 2024-03-25 DIAGNOSIS — Z23 Encounter for immunization: Secondary | ICD-10-CM

## 2024-03-25 DIAGNOSIS — Z122 Encounter for screening for malignant neoplasm of respiratory organs: Secondary | ICD-10-CM

## 2024-03-25 MED ORDER — BREZTRI AEROSPHERE 160-9-4.8 MCG/ACT IN AERO: 2.0000 | INHALATION_SPRAY | Freq: Two times a day (BID) | RESPIRATORY_TRACT | 12 refills | Status: AC

## 2024-03-25 MED ORDER — VARENICLINE TARTRATE (STARTER) 0.5 MG X 11 & 1 MG X 42 PO TBPK: ORAL_TABLET | ORAL | 0 refills | Status: AC

## 2024-03-25 MED ORDER — VARENICLINE TARTRATE 1 MG PO TABS: 1.0000 mg | ORAL_TABLET | Freq: Two times a day (BID) | ORAL | 1 refills | Status: AC

## 2024-03-25 NOTE — Assessment & Plan Note (Addendum)
 [name]

## 2024-03-25 NOTE — Patient Instructions (Signed)
  VISIT SUMMARY: During your visit, we discussed your ongoing issues with sleep apnea, lung health, smoking, and insomnia. We reviewed your recent home sleep study and lung cancer screening results. We also talked about your current medications and smoking cessation efforts.  YOUR PLAN: -OBSTRUCTIVE SLEEP APNEA: Obstructive sleep apnea is a condition where your breathing repeatedly stops and starts during sleep. Your recent home sleep study showed moderate sleep apnea. We discussed the potential benefits of using CPAP therapy, especially for improving your mood. Please consider using CPAP if your symptoms worsen or if you decide you want treatment.  -CENTRILOBULAR EMPHYSEMA: Centrilobular emphysema is a type of chronic lung disease often caused by long-term smoking, leading to shortness of breath. You have been prescribed Breztri , which you should take as 2 puffs twice daily. You also have albuterol  to use only as needed. It is important to try to quit smoking to help manage this condition.  -TOBACCO USE DISORDER: Tobacco use disorder is a dependence on tobacco products. We discussed using Chantix  to help you quit smoking, starting with 0.5 mg once daily for 3 days, then 0.5 mg twice daily for 4 days, followed by 1 mg twice daily. You should also use nicotine  patches: 21 mg for 4 weeks, 40 mg for 4 weeks, then 7 mg for 4 weeks. Please reach out if you need additional support or patches.  -INSOMNIA: Insomnia is difficulty falling or staying asleep. You should continue your current medications, trazodone  and olanzapine , to help manage this condition.  -LUNG CANCER SCREENING, ONGOING SURVEILLANCE: Given your smoking history, you are at higher risk for lung cancer. Your recent lung cancer screening showed no current concerns. We will continue with annual lung cancer screenings to monitor your health.  INSTRUCTIONS: Please follow up in 3 months to assess your progress with smoking cessation. Continue with  your annual lung cancer screenings. If you have any questions or need additional support, do not hesitate to contact us .                      Contains text generated by [employer]bridge.                                 Contains text generated by Abridge.
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
## 2024-03-25 NOTE — Assessment & Plan Note (Addendum)
 SABRA

## 2024-03-26 ENCOUNTER — Encounter (HOSPITAL_COMMUNITY): Payer: Self-pay | Admitting: Clinical

## 2024-03-26 ENCOUNTER — Ambulatory Visit (INDEPENDENT_AMBULATORY_CARE_PROVIDER_SITE_OTHER): Admitting: Clinical

## 2024-03-26 DIAGNOSIS — F4312 Post-traumatic stress disorder, chronic: Secondary | ICD-10-CM

## 2024-03-26 DIAGNOSIS — F1911 Other psychoactive substance abuse, in remission: Secondary | ICD-10-CM

## 2024-03-26 DIAGNOSIS — F419 Anxiety disorder, unspecified: Secondary | ICD-10-CM

## 2024-03-26 DIAGNOSIS — F330 Major depressive disorder, recurrent, mild: Secondary | ICD-10-CM

## 2024-03-26 NOTE — Progress Notes (Signed)
 THERAPIST PROGRESS NOTE  Session Time: 1:03pm-2:01pm  Session #23  Virtual Visit via Video Note  I connected with [name] on 03/26/24 at  1:00 PM EST by a video enabled telemedicine application and verified that I am speaking with the correct person using two identifiers.  Location: Patient: home Provider: home office    I discussed the limitations of evaluation and management by telemedicine and the availability of in person appointments. The patient expressed understanding and agreed to proceed.   I discussed the assessment and treatment plan with the patient. The patient was provided an opportunity to ask questions and all were answered. The patient agreed with the plan and demonstrated an understanding of the instructions.   The patient was advised to call back or seek an in-person evaluation if the symptoms worsen or if the condition fails to improve as anticipated.  I provided 58 minutes of non-face-to-face time during this encounter.     [name], LCSW    Participation Level: Active  Behavioral Response: Casual Alert  Euthymic  Type of Therapy: Individual Therapy  Treatment Goals addressed:  LTG: Elimination of maladaptive behaviors and thinking patterns which interfere with resolution of trauma as evidenced by self-report and clinical observation  LTG: Develop and implement effective coping skills to carry out normal responsibilities and participate constructively in relationships as evidenced by self-report  LTG: Score less than 9 on the PHQ-9 and less than 5 on the GAD-7 as evidenced by intermittent administration of the questionnaires to determine progress in managing depression and anxiety.  LTG: Increase coping skills to manage depression/anxiety/anger and improve ability to perform daily activities as evidenced by fewer depressive episodes and/or anger outbursts and/or anxious days per self-report and collateral information.  STG: Learn and  practice communication techniques such as active listening, I statements, open-ended questions, fair fighting rules, initiating conversations;  learn about boundary types and how to implement/enforce them AEB self-report of use of same. STG: Process life events to the extent needed so that will be able to move forward with various areas of life in a better frame of mind per self-report of improved satisfaction with life 5 out of 7 days over the next 6 months.  LTG: [name] will continue to enjoy her improved quality of life by maintaining ongoing abstinence from all mood-altering substances, specifically alcohol  and cocaine, for the next year, one day at a time.  STG: Learn emotion regulation strategies, distress tolerance skills, interpersonal effectiveness techniques, and mindfulness practices and use them in session and in life situations to improve results and satisfaction with life 5 days out of 7.  STG: Decrease cognitive distortions contributing negatively to mood and behavior by identifying 5-7 mind traps that are present; learn how to come up with replacement houghts that are more balanced/realistic/helpful; explore core beliefs & their effect  STG: Learn a variety of breathing techniques and grounding strategies, practice in session then report independent application out of session 2-4 times per month or more often, if needed.  STG: Gain insight into shame, learn coping skills, and increase resilience through processing of life in a shame framework. LTG: Identify elements of personal relapse prevention plan, assisted by making lists of triggers, warning signs, risk factors, and coping skills to prevent relapse 7 days out of 7 over the next 365 days.  ProgressTowards Goals: Progressing  Interventions: Motivational Interviewing, Conservator, Museum/gallery, Supportive, and Other: nicotine  cessation, sharing of pills     Summary: [name] is a 73 y.o.  female who presents with chronic PTSD,  substance use disorders in sustained remission, significant weight loss, and multiple medical issues. She presented oriented x5 and stated she was feeling good.  CSW evaluated patient's medication compliance, use of coping tools, and self-care, as applicable.  She provided an update on various aspects of her life that are normally discussed in therapy, including the fact that her recent lung test showed no cancer but her pulmonologist still wants her to quit smoking, is really pushing her in that direction. She disclosed that she has been smoking for 56 years and is not ready to quit now.  She did quit for a year using Chantix  at one point, stated that when she quit using crack cocaine, she quit everything.  She has even tried hypnotism in the past to quit smoking.  She also disclosed that she is now smoking cigarillos instead of cigarettes because she cannot afford $15 a day for the pack and a half she smokes, which costs only $3 in cigarillos.  Using MI, CSW discussed her motivation level and she reported that if she did stop smoking, she would cough less, would save money, and would ensure that her son will let her take care of his babies which right now he will not do because of her smoking.  On the other hand, she has very little confidence at this point that she could quit.  She stated she uses it to relax.  We discussed once again that she is continuing to share her opiates with a friend in need and the person is pushing her for more.  She decided to become firmer about saying no to the friend, has already been setting limits so is now feeling better about continuing to do so, even becoming a little firmer.  She is NOT willing to stop altogether even though she verbalized understanding of negative repercussions to herself.  The last part of the session she disclosed that she has seen her bad brother who came to visit at her request, although she expressed anger that he acted as though nothing had  happened between them.  She dwelled on information about her birth parents, saying she had been told her birth mother was in her 71s when patient was born, with no income due to her husband being in prison, thus she was one more mouth to feed that mother could not handle.    Suicidal/Homicidal: No without intent/plan  Therapist Response:  Patient is progressing AEB engaging in scheduled therapy session.  Throughout the session, CSW gave patient the opportunity to explore thoughts and feelings associated with current life situations and past/present stressors.   CSW challenged patient gently and appropriately to consider different ways of looking at reported issues. CSW encouraged patients expression of feelings and validated these using empathy, active listening, open body language, and unconditional positive regard.       Plan/Recommendations:  Return to therapy in 4 weeks to next scheduled appointment on 1/13, reflect on what was discussed in session, engage in self care behaviors as explored in session, do homework as assigned (enforce boundary she has set with friend, consider reasons she might want to quit smoking), and return to next session prepared to talk about experience with new coping methods.  Diagnosis:  Mild episode of recurrent major depressive disorder  Chronic post-traumatic stress disorder (PTSD)  Substance abuse in remission (HCC)  Anxiety disorder, unspecified type  Collaboration of Care: Psychiatrist AEB - psychiatrist can read therapy notes; therapist can and  does read psychiatric notes prior to sessions   Patient/Guardian was advised Release of Information must be obtained prior to any record release in order to collaborate their care with an outside provider. Patient/Guardian was advised if they have not already done so to contact the registration department to sign all necessary forms in order for us  to release information regarding their care.   Consent:  Patient/Guardian gives verbal consent for treatment and assignment of benefits for services provided during this visit. Patient/Guardian expressed understanding and agreed to proceed.   [name], LCSW 03/26/2024

## 2024-04-15 ENCOUNTER — Other Ambulatory Visit (HOSPITAL_COMMUNITY): Payer: Self-pay

## 2024-04-15 DIAGNOSIS — Z62819 Personal history of unspecified abuse in childhood: Secondary | ICD-10-CM

## 2024-04-15 DIAGNOSIS — F4312 Post-traumatic stress disorder, chronic: Secondary | ICD-10-CM

## 2024-04-15 MED ORDER — OLANZAPINE 5 MG PO TABS: 5.0000 mg | ORAL_TABLET | Freq: Every day | ORAL | 0 refills | Status: DC

## 2024-04-26 ENCOUNTER — Telehealth (HOSPITAL_COMMUNITY): Admitting: Psychiatry

## 2024-04-26 ENCOUNTER — Encounter (HOSPITAL_COMMUNITY): Payer: Self-pay | Admitting: Psychiatry

## 2024-04-26 VITALS — Wt 123.0 lb

## 2024-04-26 DIAGNOSIS — F1911 Other psychoactive substance abuse, in remission: Secondary | ICD-10-CM

## 2024-04-26 DIAGNOSIS — F4312 Post-traumatic stress disorder, chronic: Secondary | ICD-10-CM

## 2024-04-26 DIAGNOSIS — Z62819 Personal history of unspecified abuse in childhood: Secondary | ICD-10-CM | POA: Diagnosis not present

## 2024-04-26 MED ORDER — TRAZODONE HCL 100 MG PO TABS: 100.0000 mg | ORAL_TABLET | Freq: Every day | ORAL | 0 refills | Status: DC

## 2024-04-26 MED ORDER — SERTRALINE HCL 100 MG PO TABS: 150.0000 mg | ORAL_TABLET | Freq: Every day | ORAL | 1 refills | Status: DC

## 2024-04-26 MED ORDER — OLANZAPINE 7.5 MG PO TABS: 7.5000 mg | ORAL_TABLET | Freq: Every day | ORAL | 1 refills | Status: DC
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
## 2024-04-30 ENCOUNTER — Ambulatory Visit (INDEPENDENT_AMBULATORY_CARE_PROVIDER_SITE_OTHER): Admitting: Clinical

## 2024-04-30 ENCOUNTER — Encounter (HOSPITAL_COMMUNITY): Payer: Self-pay | Admitting: Clinical

## 2024-04-30 DIAGNOSIS — F1911 Other psychoactive substance abuse, in remission: Secondary | ICD-10-CM

## 2024-04-30 DIAGNOSIS — F330 Major depressive disorder, recurrent, mild: Secondary | ICD-10-CM | POA: Diagnosis not present

## 2024-04-30 DIAGNOSIS — F4312 Post-traumatic stress disorder, chronic: Secondary | ICD-10-CM | POA: Diagnosis not present

## 2024-04-30 NOTE — Progress Notes (Unsigned)
 THERAPIST PROGRESS NOTE  Session Time: 2:02pm-3:02pm  Session #24  Virtual Visit via Video Note  I connected with [name] on 05/01/2024 at  2:00 PM EST by a video enabled telemedicine application and verified that I am speaking with the correct person using two identifiers.  Location: Patient: home Provider: home office    I discussed the limitations of evaluation and management by telemedicine and the availability of in person appointments. The patient expressed understanding and agreed to proceed.   I discussed the assessment and treatment plan with the patient. The patient was provided an opportunity to ask questions and all were answered. The patient agreed with the plan and demonstrated an understanding of the instructions.   The patient was advised to call back or seek an in-person evaluation if the symptoms worsen or if the condition fails to improve as anticipated.  I provided 60 minutes of non-face-to-face time during this encounter.     [name], LCSW   Participation Level: Active  Behavioral Response: Casual Alert  Depressed   Type of Therapy: Individual Therapy  Treatment Goals addressed:  LTG: Elimination of maladaptive behaviors and thinking patterns which interfere with resolution of trauma as evidenced by self-report and clinical observation  LTG: Develop and implement effective coping skills to carry out normal responsibilities and participate constructively in relationships as evidenced by self-report  LTG: Score less than 9 on the PHQ-9 and less than 5 on the GAD-7 as evidenced by intermittent administration of the questionnaires to determine progress in managing depression and anxiety.  LTG: Increase coping skills to manage depression/anxiety/anger and improve ability to perform daily activities as evidenced by fewer depressive episodes and/or anger outbursts and/or anxious days per self-report and collateral information.  STG: Learn and  practice communication techniques such as active listening, I statements, open-ended questions, fair fighting rules, initiating conversations;  learn about boundary types and how to implement/enforce them AEB self-report of use of same. STG: Process life events to the extent needed so that will be able to move forward with various areas of life in a better frame of mind per self-report of improved satisfaction with life 5 out of 7 days over the next 6 months.  LTG: [name] will continue to enjoy her improved quality of life by maintaining ongoing abstinence from all mood-altering substances, specifically alcohol  and cocaine, for the next year, one day at a time.  STG: Learn emotion regulation strategies, distress tolerance skills, interpersonal effectiveness techniques, and mindfulness practices and use them in session and in life situations to improve results and satisfaction with life 5 days out of 7.  STG: Decrease cognitive distortions contributing negatively to mood and behavior by identifying 5-7 mind traps that are present; learn how to come up with replacement houghts that are more balanced/realistic/helpful; explore core beliefs & their effect  STG: Learn a variety of breathing techniques and grounding strategies, practice in session then report independent application out of session 2-4 times per month or more often, if needed.  STG: Gain insight into shame, learn coping skills, and increase resilience through processing of life in a shame framework. LTG: Identify elements of personal relapse prevention plan, assisted by making lists of triggers, warning signs, risk factors, and coping skills to prevent relapse 7 days out of 7 over the next 365 days.  ProgressTowards Goals: Progressing  Interventions: Conservator, Museum/gallery, Psychosocial Skills: positive affirmations, AA, and Supportive     Summary: [name] is a 74 y.o. female who presents  with chronic PTSD, substance use disorders in  sustained remission, significant weight loss, and multiple medical issues. Patient presented with noticeably less energy and optimism than her baseline. She reported a recent psychiatric appointment during which both Sertraline  and Olanzapine  were increased due to low motivation and worsening depressive symptoms. Patient expressed relief about the medication changes but stated she has not been on the adjusted doses long enough to notice improvement. She also reported starting Chantix , without elaborating on progress with smoking cessation. Patient discussed multiple recent family interactions, including visits from her bad brother, noting improved effort on his part and successful avoidance of conflictual topics, though she remains emotionally uncomfortable around him. She has not heard from her son since Christmas. Patient expressed disappointment with her rich brother but stated she directly communicated her feelings, resulting in financial support and forgiveness of a small loan. She reported several recent stress-reducing developments, including switching to a more beneficial insurance plan and receiving an visual merchandiser credit from DSS, contributing to increased feelings of comfort and stability. CSW provided guidance regarding safeguarding funds and maintaining financial boundaries. Patient discussed recognizing unhealthy dynamics with acquaintances, including setting firmer boundaries with a friend who has been borrowing from her and identifying another neighbor as not a friend due to ongoing rude behavior. Patient reported continued weight loss to 122 pounds now and is considering frozen meals to reduce the burden of cooking. When discussing expanding social supports, patient referenced positive past experiences with Alcoholics Anonymous and stated plans to attend a meeting soon, reassured that participation does not require speaking. Patient expressed persistent negative self-image tied to  long-standing maternal messages of worthlessness. CSW supported patient in developing and writing down non-toxic positive affirmations.  I like to give to people and I am generous.  I am worthy to be treated fairly and appreciated.  My opinions matter.  I do what I say I will do.  Suicidal/Homicidal: No without intent/plan  Therapist Response:  Patient continues to experience depressive symptoms, low motivation, and negative self-concept, though she demonstrates increased insight, improved boundary-setting, and effective communication in some family relationships. Recent financial and insurance-related improvements appear to have reduced stress and increased perceived stability. Medication adjustments are recent, and therapeutic benefit is not yet evident. Patient shows readiness to engage in supportive community resources and is receptive to cognitive and self-compassion-focused interventions      Plan/Recommendations:  Return to therapy in 4 weeks to next scheduled appointment on 1/13, reflect on what was discussed in session, engage in self care behaviors as explored in session, do homework as assigned (attend AA as planned, try out frozen dinners 3 times a day for weight gain, say positive affirmations daily), and return to next session prepared to talk about experience with new coping methods.  Diagnosis:  Mild episode of recurrent major depressive disorder  Chronic post-traumatic stress disorder (PTSD)  Substance abuse in remission [facility])  Collaboration of Care: Psychiatrist AEB - psychiatrist can read therapy notes; therapist can and does read psychiatric notes prior to sessions   Patient/Guardian was advised Release of Information must be obtained prior to any record release in order to collaborate their care with an outside provider. Patient/Guardian was advised if they have not already done so to contact the registration department to sign all necessary forms in order for us  to release  information regarding their care.   Consent: Patient/Guardian gives verbal consent for treatment and assignment of benefits for services provided during this visit. Patient/Guardian expressed understanding and agreed  to proceed.   [name], LCSW 05/01/2024

## 2024-05-02 ENCOUNTER — Telehealth: Payer: Self-pay | Admitting: *Deleted

## 2024-05-02 NOTE — Telephone Encounter (Signed)
 Copied from CRM #[id number]. Topic: Clinical - Medication Question >> Apr 29, 2024  2:52 PM Leila BROCKS wrote: Reason for CRM: Patient [phone] wants to ask Dr. Theodoro if nicotine  patches can be added while using varenicline  (CHANTIX ) 1 MG tablet. Patient states does not feel Chantix  is enough, still smoking. Patient already has nicotine  patches, but is asking for Dr. Lavena consult before using it. Please advise and call back.   [id number] [place], [place] - [address] AT [facility] [address] [place] [id number] Phone:[phone]:[phone]

## 2024-05-03 NOTE — Telephone Encounter (Signed)
 ATCx1,asked [facility] doc of day to advise whether it is okay,states it is okay to use together as they target different areas and is considered combination therapy,Dr.Pawar is our office.LMTCB to advise

## 2024-05-08 NOTE — Telephone Encounter (Signed)
 Copied from CRM [id number]. Topic: Clinical - Medication Question >> May 03, 2024  2:23 PM Rilla B wrote: Reason for CRM: Patient returning call to [facility].  Called CAL. Please call patient @ [phone] >> May 03, 2024  2:28 PM Rilla B wrote: Patient states chantix  is experiencing depression.  Would like to discuss

## 2024-05-08 NOTE — Telephone Encounter (Signed)
 Copied from CRM [id number]. Topic: Clinical - Medication Question >> May 03, 2024  2:23 PM Rilla B wrote: Reason for CRM: Patient returning call to [facility].  Called CAL. Please call patient @ [phone] >> May 08, 2024  3:28 PM Russell PARAS wrote: Pt returning call from [place]. Attempted to contact CAL, no answer  Pt requested call back  CB#  [phone] >> May 03, 2024  2:28 PM Rilla B wrote: Patient states chantix  is experiencing depression.  Would like to discuss

## 2024-05-08 NOTE — Telephone Encounter (Signed)
 I called the pt and there was no answer- LMTCB. ?

## 2024-05-09 NOTE — Telephone Encounter (Signed)
 I called and spoke to pt. Pt states she has decided to stop taking Chantix  as this has caused her to have some depression and have mood swings. Pt states her therapist told her to keep taking it but pt was not sure if she wanted too.   Pt also wanted to know if she could use nicotine  patches with the Chantix  if she does end up back on it. Pt states it has been about 4-5 days since she last took the Chantix . Pt states she has been on it for about a month or month and a half. Please advise

## 2024-05-13 ENCOUNTER — Telehealth (HOSPITAL_COMMUNITY): Payer: Self-pay | Admitting: *Deleted

## 2024-05-13 NOTE — Telephone Encounter (Signed)
 Pt called with concern that she only has 3 Trazodone  100 mg tablets left as the last prescription was only written for #30. Pt requests to go back to previous regime of 1 tablet at bedtime and then 1 extra tablet for middle insomnia. She did not mention increasing the Olanzapine . Please review and advise.   Last visit: 04/26/24 Next visit: 05/20/24

## 2024-05-14 ENCOUNTER — Telehealth (HOSPITAL_COMMUNITY): Payer: Self-pay | Admitting: *Deleted

## 2024-05-14 ENCOUNTER — Ambulatory Visit (INDEPENDENT_AMBULATORY_CARE_PROVIDER_SITE_OTHER): Admitting: Clinical

## 2024-05-14 ENCOUNTER — Encounter (HOSPITAL_COMMUNITY): Payer: Self-pay | Admitting: Clinical

## 2024-05-14 ENCOUNTER — Other Ambulatory Visit (HOSPITAL_COMMUNITY): Payer: Self-pay | Admitting: *Deleted

## 2024-05-14 DIAGNOSIS — F419 Anxiety disorder, unspecified: Secondary | ICD-10-CM | POA: Diagnosis not present

## 2024-05-14 DIAGNOSIS — F4312 Post-traumatic stress disorder, chronic: Secondary | ICD-10-CM | POA: Diagnosis not present

## 2024-05-14 DIAGNOSIS — F1911 Other psychoactive substance abuse, in remission: Secondary | ICD-10-CM | POA: Diagnosis not present

## 2024-05-14 DIAGNOSIS — F330 Major depressive disorder, recurrent, mild: Secondary | ICD-10-CM

## 2024-05-14 DIAGNOSIS — Z62819 Personal history of unspecified abuse in childhood: Secondary | ICD-10-CM

## 2024-05-14 MED ORDER — TRAZODONE HCL 100 MG PO TABS: 100.0000 mg | ORAL_TABLET | Freq: Every day | ORAL | 0 refills | Status: DC
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
## 2024-05-14 NOTE — Telephone Encounter (Signed)
 Writer spoke to pt to advise that she should not be taking more than 100 mg at bedtime of the Trazodone . Pt verbalizes understanding and says that she will try to take the Zyprexa  as ordered and use the Trazodone  for middle insomnia.

## 2024-05-14 NOTE — Telephone Encounter (Signed)
ATC x1.  LMTCB. 

## 2024-05-14 NOTE — Telephone Encounter (Signed)
 She was told not to take trazodone  more than 100 mg.  She is already taking Zoloft  150, pain medicine, muscle relaxant and we increase olanzapine  to 7.5 mg.  She was started on Chantix  sometime that also cause insomnia.  Will avoid giving more than prescribed trazodone .

## 2024-05-16 ENCOUNTER — Encounter: Payer: Self-pay | Admitting: Internal Medicine

## 2024-05-16 ENCOUNTER — Ambulatory Visit: Attending: Internal Medicine | Admitting: Internal Medicine

## 2024-05-16 VITALS — BP 99/63 | HR 75 | Temp 98.9°F | Ht 65.0 in | Wt 126.0 lb

## 2024-05-16 DIAGNOSIS — R1314 Dysphagia, pharyngoesophageal phase: Secondary | ICD-10-CM

## 2024-05-16 DIAGNOSIS — E782 Mixed hyperlipidemia: Secondary | ICD-10-CM

## 2024-05-16 DIAGNOSIS — G4733 Obstructive sleep apnea (adult) (pediatric): Secondary | ICD-10-CM

## 2024-05-16 DIAGNOSIS — F17200 Nicotine dependence, unspecified, uncomplicated: Secondary | ICD-10-CM | POA: Diagnosis not present

## 2024-05-16 DIAGNOSIS — R490 Dysphonia: Secondary | ICD-10-CM

## 2024-05-16 DIAGNOSIS — J449 Chronic obstructive pulmonary disease, unspecified: Secondary | ICD-10-CM

## 2024-05-16 DIAGNOSIS — R634 Abnormal weight loss: Secondary | ICD-10-CM

## 2024-05-16 MED ORDER — SIMVASTATIN 40 MG PO TABS: 40.0000 mg | ORAL_TABLET | Freq: Every day | ORAL | 1 refills | Status: AC

## 2024-05-16 NOTE — Progress Notes (Signed)
 "   Patient ID: [name], female    DOB: 11/23/1950  MRN: [id number]  CC: Follow-up (Follow-up./Worsening cough, hoarse voice X2 weeks /Reports Chantix  side effects & no longer taking /Reports unintentional weight loss /Already received flu vax. Yes to shingles vax)   Subjective: [name] is a 74 y.o. female who presents for chronic ds management. 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 Maree   Discussed the use of AI scribe software for clinical note transcription with the patient, who gave verbal consent to proceed.  History of Present Illness [name] is a 74 year old female with COPD who presents with persistent cough and hoarseness.  She has been experiencing a persistent cough and intermittent hoarseness for the past month to month and a half. The hoarseness lasts three to four days or up to two weeks before subsiding. She attributes the cough to smoking and is currently hoarse.  She has had difficulty swallowing both solids and liquids for the past six months, with regurgitation of food before it reaches her stomach occurring on three occasions. Saw her GI Dr. Saintclair in 11/2023 and had EDG and and colonoscopy done.  EGD revealed some Barrett's mucosa and squamous papilloma was removed. She continues to take pantoprazole  for reflux, which she describes as severe at times.  She reports that the difficulty swallowing started happening after the EGD  Tob dep/COPD: Doing well on Breztri  and Albuterol  inhalers. She has a history of smoking and currently smokes one and a half to two packs per day. She attempted to quit using Chantix  prescribed by her new pulmonologist Dr. Pawar but discontinued it  two weeks ago due to side effects, including depression and lack of motivation. Her psychiatrist increased her sertraline  to 150 mg and olanzapine  to 7.5 mg to manage these symptoms so that she can continue the Chantix  but she still decided to quit the chantix . She has previously used nicotine  patches and has them at home. Mentally not ready to quit.  She expresses concern about weight again. Thinks she is down 10 lbs since last yr. In May of last yr she was 125 lbs and 126 in July. Today she is 126 lbs; probably about 124 lbs taking in to account her clothing. She reports a lack of appetite, often not eating until late in the evening, and attributes some of this to depression following her niece's death last year. She consumes ice cream nightly and prefers nutritional drinks. Up to date with age appropriate cancer screenings. No fever.  Had repeat home sleep study 01/2024 that still showed mild sleep apnea.  intolerant of CPAP therapy, feeling like she is smothering when using the CPAP machine. Told her pulmonary doctor that she prefers to forgo treatment with CPAP  HL: compliant with Zocor . Last LDL 80 in May 2025.  Still receiving pain management through [facility]. On Oxycodone .    Patient Active Problem List   Diagnosis Date Noted   S/P laparoscopic cholecystectomy 07/19/2022   Urinary urgency 09/17/2021   Pelvic floor dysfunction in female 09/06/2021   Insomnia 07/16/2021   COPD mixed type (HCC) 04/25/2020   Macular pucker, left eye 01/14/2020   Nuclear sclerotic cataract of both eyes 01/14/2020  Peripheral focal chorioretinal inflammation of both eyes 01/14/2020   Encounter for medication review and counseling 01/13/2020   OSA (obstructive sleep apnea) 12/25/2019   Vulvar lesion 07/17/2019   Laryngopharyngeal reflux (LPR) 04/03/2019   Hoarseness 03/25/2019   Status post total replacement of left hip 03/04/2019   Perennial allergic rhinitis 02/28/2019   Rhinitis  medicamentosa 02/28/2019   Primary osteoarthritis of left hip 02/20/2019   Deviated septum 12/25/2018   Primary osteoarthritis of right hip 08/27/2018   Status post right hip replacement 08/27/2018   Chronic sinusitis 06/10/2016   Positive urine drug screen 06/12/2014   Headache 11/18/2013   Seizure disorder (HCC) 11/18/2013   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   Cocaine use disorder in remission 02/09/2013   Lethargy 02/09/2013   Anxiety 01/20/2012   History of difficult intubation 01/20/2012   Shoulder joint pain 09/23/2011   Subacromial or subdeltoid bursitis 09/23/2011   Increased frequency of urination 06/15/2011   Postoperative wound infection 05/14/2011   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   SEBORRHEIC KERATOSIS 04/03/2007   EPIDERMOID CYST, BACK 04/03/2007   BREAST TENDERNESS 02/09/2007   TOBACCO ABUSE 12/15/2006   ALLERGIC RHINITIS, SEASONAL 12/15/2006   GERD 12/15/2006   DIVERTICULOSIS, COLON 12/15/2006   DEGENERATIVE DISC DISEASE, LUMBOSACRAL SPINE 12/15/2006   ALCOHOL  ABUSE, HX OF 12/15/2006   History of colonic polyps 12/15/2006   BARRETT'S ESOPHAGUS, HX OF 12/15/2006   HYPERLIPIDEMIA, MIXED 12/13/2006   Depression 12/13/2006   INTERSTITIAL CYSTITIS 12/13/2006   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  Trying to quit.  06/08/2023 hfb  Vaping Use   Vaping status: Never Used  Substance and Sexual Activity   Alcohol  use: Not Currently     Comment: 07-05-2022 hx alcohol  abuse, quit 10 in 2016 per patient   Drug use: Yes    Types: Crack cocaine, Cocaine    Comment: quit 02/26/2019 per patient on 02/23/23   Sexual activity: Not Currently    Birth control/protection: Post-menopausal  Other Topics Concern   Not on file  Social History Narrative   Patient lives at home alone.   Caffeine  Use: 1-2 cups daily   Social Drivers of Health   Tobacco Use: High Risk (05/16/2024)   Patient History    Smoking Tobacco Use: Every Day    Smokeless Tobacco Use: Never    Passive Exposure: Not on file  Financial Resource Strain: Low Risk (11/03/2023)   Overall Financial Resource Strain (CARDIA)    Difficulty of Paying Living Expenses: Not hard at all  Food Insecurity: No Food Insecurity (11/03/2023)   Epic    Worried About Programme Researcher, Broadcasting/film/video in the Last Year: Never true    Ran Out of Food in the Last Year: Never true  Transportation Needs: No Transportation Needs (11/03/2023)   Epic    Lack of Transportation (Medical): No    Lack of Transportation (Non-Medical): No  Physical Activity: Insufficiently Active (11/03/2023)   Exercise Vital Sign    Days of Exercise per Week: 2 days    Minutes of Exercise per Session: 30 min  Stress: Stress Concern Present (11/03/2023)   [employer] of Occupational Health - Occupational Stress Questionnaire    Feeling of Stress: Rather much  Social Connections: Socially Isolated (11/03/2023)   Social Connection and Isolation Panel    Frequency of Communication with Friends and Family: More than three times a week    Frequency of Social Gatherings with Friends and Family: More than three times a week    Attends Religious Services: Never    Database Administrator or Organizations: No    Attends Banker Meetings: Never    Marital Status: Divorced  Catering Manager Violence: Not At Risk (11/03/2023)   Epic    Fear of Current or Ex-Partner: No    Emotionally Abused: No    Physically  Abused: No    Sexually Abused: No  Depression (PHQ2-9): Medium Risk (05/16/2024)   Depression (PHQ2-9)    PHQ-2 Score: 10  Alcohol  Screen: Low Risk (01/25/2023)   Alcohol  Screen    Last Alcohol  Screening Score (AUDIT): 0  Housing: Low Risk (11/03/2023)   Epic    Unable to Pay for Housing in the Last Year: No    Number of Times Moved in the Last Year: 0    Homeless in the Last Year: No  Utilities: Not At Risk (11/03/2023)   Epic    Threatened with loss of utilities: No  Health Literacy: Adequate Health Literacy (11/03/2023)   B1300 Health Literacy    Frequency of need for help with medical instructions: Never    Family History  Adopted: Yes  Problem Relation Age of Onset   Breast cancer Neg Hx    Allergic rhinitis Neg Hx    Angioedema Neg Hx    Asthma Neg Hx    Atopy Neg Hx    Eczema Neg Hx    Immunodeficiency Neg Hx    Urticaria Neg Hx     Past Surgical History:  Procedure Laterality Date   CATARACT EXTRACTION W/ INTRAOCULAR LENS  IMPLANT, BILATERAL  2012   CHOLECYSTECTOMY N/A 07/19/2022   Procedure: LAPAROSCOPIC CHOLECYSTECTOMY; BILATERAL TAP BLOCK;  Surgeon: Tanda Locus, MD;  Location: [facility];  Service: General;  Laterality: N/A;   CYSTO W/ HYDRODISTENTION/  INSTILLATION THERAPY  multiiple since 2007;  last one 09-27-2013 @[facility]   ,  dr lamar sar   ENDOSCOPIC RETROGRADE CHOLANGIOPANCREATOGRAPHY (ERCP) WITH PROPOFOL  N/A 07/07/2022   Procedure: ENDOSCOPIC RETROGRADE CHOLANGIOPANCREATOGRAPHY (ERCP) WITH PROPOFOL ;  Surgeon: Saintclair Jasper, MD;  Location: [facility] ENDOSCOPY;  Service: Gastroenterology;  Laterality: N/A;   INCISION AND DRAINAGE  05-14-2011  @[facility]    right shoulder   INTERSTIM IMPLANT REMOVAL  01-20-2012  dr lamar sar @[facility]    previously placed 2009   MULTIPLE TOOTH EXTRACTIONS     NASAL SEPTUM SURGERY     x 2 occasions   NEUROPLASTY BRACHIAL PLEXUS  04-28-2011   @[facility]    REMOVAL OF STONES  07/07/2022   Procedure: REMOVAL OF STONES;  Surgeon: Saintclair Jasper, MD;   Location: [facility] ENDOSCOPY;  Service: Gastroenterology;;   SALPINGOOPHORECTOMY Bilateral 1990s   SINOSCOPY     SPHINCTEROTOMY  07/07/2022   Procedure: ANNETT;  Surgeon: Saintclair Jasper, MD;  Location[facility] ENDOSCOPY;  Service: Gastroenterology;;   TOOTH EXTRACTION N/A 02/24/2023   Procedure: DENTAL RESTORATION/EXTRACTIONS;  Surgeon: Sheryle Hamilton, DMD;  Location: [facility] OR;  Service:  Oral Surgery;  Laterality: N/A;   TOTAL HIP ARTHROPLASTY Right 08/27/2018   Procedure: RIGHT TOTAL HIP ARTHROPLASTY ANTERIOR APPROACH;  Surgeon: Jerri Kay HERO, MD;  Location: [facility] ORS;  Service: Orthopedics;  Laterality: Right;   TOTAL HIP ARTHROPLASTY Left 03/04/2019   Procedure: LEFT TOTAL HIP ARTHROPLASTY ANTERIOR APPROACH;  Surgeon: Jerri Kay HERO, MD;  Location: [facility] OR;  Service: Orthopedics;  Laterality: Left;   VAGINAL HYSTERECTOMY  1990s    ROS: Review of Systems Negative except as stated above  PHYSICAL EXAM: BP 99/63 (BP Location: Left Arm, Patient Position: Sitting, Cuff Size: Normal)   Pulse 75   Temp 98.9 F (37.2 C) (Oral)   Ht 5' 5 (1.651 m)   Wt 126 lb (57.2 kg)   SpO2 94%   BMI 20.97 kg/m   Wt Readings from Last 3 Encounters:  05/16/24 126 lb (57.2 kg)  03/25/24 129 lb (58.5 kg)  11/13/23 126 lb (57.2 kg)    Physical Exam  General appearance - pt with temporal wasting which is not new for her. She is in NAD Mental status - normal mood, behavior, speech, dress, motor activity, and thought processes Mouth - mucous membranes moist, pharynx normal without lesions Neck - supple, no significant adenopathy Chest - clear to auscultation, no wheezes, rales or rhonchi, symmetric air entry Heart - normal rate, regular rhythm, normal S1, S2, no murmurs, rubs, clicks or gallops Extremities - peripheral pulses normal, no pedal edema, no clubbing or cyanosis      Latest Ref Rng & Units 06/12/2023    3:08 PM 01/31/2023   12:27 PM 07/19/2022    1:48 PM  CMP  Glucose 70 - 99 mg/dL 883  93  893   BUN  8 - 27 mg/dL 16  13  14    Creatinine 0.57 - 1.00 mg/dL 9.25  9.08  8.98   Sodium 134 - 144 mmol/L 141  142  138   Potassium 3.5 - 5.2 mmol/L 4.5  4.8  4.1   Chloride 96 - 106 mmol/L 103  104  107   CO2 20 - 29 mmol/L 22  23  24    Calcium  8.7 - 10.3 mg/dL 9.4  9.0  8.2   Total Protein 6.0 - 8.5 g/dL 6.6  6.5  6.3   Total Bilirubin 0.0 - 1.2 mg/dL <9.7  0.3  0.5   Alkaline Phos 44 - 121 IU/L 92  80  43   AST 0 - 40 IU/L 11  15  24    ALT 0 - 32 IU/L 9  12  15     Lipid Panel     Component Value Date/Time   CHOL 157 08/29/2023 1458   TRIG 132 08/29/2023 1458   HDL 54 08/29/2023 1458   CHOLHDL 2.9 08/29/2023 1458   CHOLHDL 2.6 Ratio 01/12/2007 2140   VLDL 31 01/12/2007 2140   LDLCALC 80 08/29/2023 1458   LDLDIRECT 160 05/29/2006 0000    CBC    Component Value Date/Time   WBC 6.8 06/12/2023 1508   WBC 3.9 (L) 07/19/2022 1315   RBC 4.60 06/12/2023 1508   RBC 3.45 (L) 07/19/2022 1315   HGB 16.1 (H) 06/12/2023 1508   HCT 46.4 06/12/2023 1508   PLT 300 06/12/2023 1508   MCV 101 (H) 06/12/2023 1508   MCH 35.0 (H) 06/12/2023 1508   MCH 38.8 (H) 07/19/2022 1315   MCHC 34.7 06/12/2023 1508   MCHC 35.0 07/19/2022 1315   RDW 13.3 06/12/2023 1508   LYMPHSABS  1.4 06/12/2023 1508   MONOABS 0.5 05/04/2022 1434   EOSABS 0.1 06/12/2023 1508   BASOSABS 0.1 06/12/2023 1508    ASSESSMENT AND PLAN: 1. Hoarseness of voice (Primary) - Ambulatory referral to ENT  2. Pharyngoesophageal dysphagia Will get her back in with her GI specialist - Ambulatory referral to Gastroenterology  3. Tobacco dependence Advised to quit. Not ready to give trail of quitting  4. COPD mixed type (HCC) Stable on Breztri  inhaler and albuterol  - CBC  5. Mixed hyperlipidemia Last LDL of 80 at goal Continue Zocor  - simvastatin  (ZOCOR ) 40 MG tablet; Take 1 tablet (40 mg total) by mouth daily at 6 PM.  Dispense: 90 tablet; Refill: 1 - Comprehensive metabolic panel with GFR  6. OSA (obstructive sleep  apnea) Mild. Not interested on trying CPAP again  7. Weight loss Pt looks underweight for height and has temporal wasting but wgh has been fairly stable for the past 1 yr. Advised to eat smaller more frequent meals and continue nutrition shakes to supplement meals. Advise to ask her psychiatrist if Remeron  is an option for her. She is up to date with age appropriate cancer screenings - TSH  Patient was given the opportunity to ask questions.  Patient verbalized understanding of the plan and was able to repeat key elements of the plan.   This documentation was completed using Paediatric nurse.  Any transcriptional errors are unintentional.  Orders Placed This Encounter  Procedures   CBC   Comprehensive metabolic panel with GFR   TSH   Ambulatory referral to Gastroenterology   Ambulatory referral to ENT     Requested Prescriptions   Signed Prescriptions Disp Refills   simvastatin  (ZOCOR ) 40 MG tablet 90 tablet 1    Sig: Take 1 tablet (40 mg total) by mouth daily at 6 PM.    No follow-ups on file.  Barnie Louder, MD, FACP     [1]  Current Outpatient Medications on File Prior to Visit  Medication Sig Dispense Refill   albuterol  (VENTOLIN  HFA) 108 (90 Base) MCG/ACT inhaler Inhale 2 puffs every 4 hours as needed 54 g 5   azaTHIOprine  (IMURAN ) 50 MG tablet Take 50 mg by mouth daily with breakfast.     budesonide-glycopyrrolate -formoterol  (BREZTRI  AEROSPHERE) 160-9-4.8 MCG/ACT AERO inhaler Inhale 2 puffs into the lungs 2 (two) times daily. 10.7 g 12   calcium  carbonate (OSCAL) 1500 (600 Ca) MG TABS tablet Take 600 mg of elemental calcium  by mouth daily with breakfast.     cyanocobalamin (VITAMIN B12) 1000 MCG tablet Take 1,000 mcg by mouth daily.     estradiol  (ESTRACE ) 2 MG tablet TAKE 1 TABLET(2 MG) BY MOUTH DAILY 90 tablet 2   Glycerin-Hypromellose-PEG 400 (DRY EYE RELIEF DROPS) 0.2-0.2-1 % SOLN Place 1-2 drops into both eyes 3 (three) times daily as needed  (dry/irritated eyes.).     lamoTRIgine  (LAMICTAL ) 100 MG tablet TAKE 1 TABLET BY MOUTH EVERY MORNING AND 2 TABLET EVERY EVENING 90 tablet 3   LINZESS  290 MCG CAPS capsule Take 290 mcg by mouth daily.     methylPREDNISolone  (MEDROL  DOSEPAK) 4 MG TBPK tablet Take as directed 21 tablet 0   Multiple Vitamin (MULTIVITAMIN WITH MINERALS) TABS tablet Take 1 tablet by mouth daily after breakfast. Centrum Women's 50+     naloxone  (NARCAN ) nasal spray 4 mg/0.1 mL SMARTSIG:Both Nares     naproxen  (NAPROSYN ) 500 MG tablet Take 1 tablet (500 mg total) by mouth 2 (two) times daily with a meal. 30  tablet 3   OLANZapine  (ZYPREXA ) 7.5 MG tablet Take 1 tablet (7.5 mg total) by mouth at bedtime. 30 tablet 1   oxyCODONE  (ROXICODONE ) 15 MG immediate release tablet Take 15 mg by mouth 5 (five) times daily as needed.     pantoprazole  (PROTONIX ) 40 MG tablet Take 40 mg by mouth daily before breakfast.     RESTASIS 0.05 % ophthalmic emulsion Place 1 drop into both eyes 2 (two) times daily.     sertraline  (ZOLOFT ) 100 MG tablet Take 1.5 tablets (150 mg total) by mouth daily. 45 tablet 1   tiZANidine (ZANAFLEX) 4 MG tablet Take 4 mg by mouth 3 (three) times daily as needed.     traZODone  (DESYREL ) 100 MG tablet Take 1 tablet (100 mg total) by mouth at bedtime. 6 tablet 0   Varenicline  Tartrate, Starter, 0.5 MG X 11 & 1 MG X 42 TBPK Take 0.5mg  tablet by mouth once daily on days 1 to 3. Then take 0.5mg  tablet twice daily on days 4 to 7. On day 8 and thereafter, take 1mg  tablet by mouth twice daily. 1 each 0   VITAMIN D PO Take 1,000 Units by mouth daily with breakfast.     varenicline  (CHANTIX ) 1 MG tablet Take 1 tablet (1 mg total) by mouth 2 (two) times daily. (Patient not taking: Reported on 05/16/2024) 60 tablet 1   Current Facility-Administered Medications on File Prior to Visit  Medication Dose Route Frequency Provider Last Rate Last Admin   Spy Agent Green / Firefly Optime  1.25 mg Intravenous Once Tanda Locus, MD       [2]  Allergies Allergen Reactions   Haloperidol Decanoate Anaphylaxis    (At age 32 years old)   Oxybutynin Chloride Other (See Comments)    TROUBLE SWALLOWING   Oxybutynin Other (See Comments)   Tape     Adhesive tape (Tegaderm) --itching  The IV bandage    "

## 2024-05-16 NOTE — Patient Instructions (Signed)
" °  VISIT SUMMARY: During your visit, we discussed your persistent cough, hoarseness, difficulty swallowing, smoking habits, COPD, sleep apnea, and weight loss. We also reviewed your general health maintenance and current medications.  YOUR PLAN: -OROPHARYNGEAL DYSPHAGIA AND HOARSENESS: Oropharyngeal dysphagia is difficulty swallowing due to problems in the throat. Your hoarseness and swallowing issues will be further evaluated by a gastroenterologist, and you may need another endoscopy. Continue taking pantoprazole  for reflux.  -TOBACCO DEPENDENCE: Tobacco dependence is an addiction to tobacco products. You are currently smoking 1.5 to 2 packs daily. We discussed the use of nicotine  patches when you feel ready to quit smoking.  -CHRONIC OBSTRUCTIVE PULMONARY DISEASE (COPD): COPD is a chronic inflammatory lung disease that obstructs airflow from the lungs. Continue using Breztri  Aerosphere twice daily as prescribed.  -OBSTRUCTIVE SLEEP APNEA: Obstructive sleep apnea is a condition where breathing repeatedly stops and starts during sleep. Since you are intolerant to CPAP therapy, we will continue with your current management without CPAP.  -ABNORMAL WEIGHT LOSS: Your weight has been stable at 124 pounds, but you have a decreased appetite and infrequent eating, possibly due to depression. We encourage you to eat smaller, more frequent meals and continue with nutritional shakes. We will discuss the potential use of mirtazapine  with your psychiatrist to help with weight gain.  -GENERAL HEALTH MAINTENANCE: You are up to date with lung cancer screening, mammogram, and colon cancer screening. Continue with routine health maintenance screenings.  INSTRUCTIONS: Please follow up with the gastroenterologist for further evaluation of your swallowing issues and possible rescope. Continue using Breztri  Aerosphere for COPD and manage your sleep apnea without CPAP. Consider using nicotine  patches when you are ready  to quit smoking. Eat smaller, more frequent meals and continue with nutritional shakes. We will discuss the potential use of mirtazapine  with your psychiatrist to help with weight gain.    Contains text generated by [employer]bridge.   "

## 2024-05-17 ENCOUNTER — Ambulatory Visit: Payer: Self-pay | Admitting: Internal Medicine

## 2024-05-17 LAB — CBC
Hematocrit: 46.7 % — ABNORMAL HIGH (ref 34.0–46.6)
Hemoglobin: 15.4 g/dL (ref 11.1–15.9)
MCH: 33.5 pg — ABNORMAL HIGH (ref 26.6–33.0)
MCHC: 33 g/dL (ref 31.5–35.7)
MCV: 102 fL — ABNORMAL HIGH (ref 79–97)
Platelets: 318 10*3/uL (ref 150–450)
RBC: 4.6 x10E6/uL (ref 3.77–5.28)
RDW: 13.7 % (ref 11.7–15.4)
WBC: 6.7 10*3/uL (ref 3.4–10.8)

## 2024-05-17 LAB — COMPREHENSIVE METABOLIC PANEL WITH GFR
ALT: 10 [IU]/L (ref 0–32)
AST: 14 [IU]/L (ref 0–40)
Albumin: 4.4 g/dL (ref 3.8–4.8)
Alkaline Phosphatase: 84 [IU]/L (ref 49–135)
BUN/Creatinine Ratio: 20 (ref 12–28)
BUN: 17 mg/dL (ref 8–27)
Bilirubin Total: <0.2 mg/dL (ref 0.0–1.2)
CO2: 25 mmol/L (ref 20–29)
Calcium: 9.4 mg/dL (ref 8.7–10.3)
Chloride: 103 mmol/L (ref 96–106)
Creatinine, Ser: 0.85 mg/dL (ref 0.57–1.00)
Globulin, Total: 2.1 g/dL (ref 1.5–4.5)
Glucose: 84 mg/dL (ref 70–99)
Potassium: 4.9 mmol/L (ref 3.5–5.2)
Sodium: 142 mmol/L (ref 134–144)
Total Protein: 6.5 g/dL (ref 6.0–8.5)
eGFR: 73 mL/min/{1.73_m2}

## 2024-05-17 LAB — TSH: TSH: 3.7 u[IU]/mL (ref 0.450–4.500)

## 2024-05-17 NOTE — Telephone Encounter (Signed)
 ATC x2 LMTCB

## 2024-05-20 ENCOUNTER — Telehealth (HOSPITAL_COMMUNITY): Admitting: Psychiatry

## 2024-05-20 ENCOUNTER — Encounter (HOSPITAL_COMMUNITY): Payer: Self-pay | Admitting: Psychiatry

## 2024-05-20 VITALS — Wt 123.0 lb

## 2024-05-20 DIAGNOSIS — Z62819 Personal history of unspecified abuse in childhood: Secondary | ICD-10-CM

## 2024-05-20 DIAGNOSIS — F4312 Post-traumatic stress disorder, chronic: Secondary | ICD-10-CM | POA: Diagnosis not present

## 2024-05-20 DIAGNOSIS — F1911 Other psychoactive substance abuse, in remission: Secondary | ICD-10-CM

## 2024-05-20 MED ORDER — SERTRALINE HCL 100 MG PO TABS: 150.0000 mg | ORAL_TABLET | Freq: Every day | ORAL | 1 refills | Status: AC

## 2024-05-20 MED ORDER — OLANZAPINE 10 MG PO TABS: 10.0000 mg | ORAL_TABLET | Freq: Every day | ORAL | 1 refills | Status: AC

## 2024-05-20 MED ORDER — TRAZODONE HCL 100 MG PO TABS: 100.0000 mg | ORAL_TABLET | Freq: Every day | ORAL | 1 refills | Status: AC

## 2024-05-23 NOTE — Telephone Encounter (Signed)
 ATC X3. LMTCB . Sending pt a mychart message to inform then completing note per protocol. NFN

## 2024-05-24 ENCOUNTER — Telehealth (HOSPITAL_COMMUNITY): Admitting: Psychiatry

## 2024-05-24 ENCOUNTER — Other Ambulatory Visit (HOSPITAL_COMMUNITY): Payer: Self-pay | Admitting: Psychiatry

## 2024-05-24 DIAGNOSIS — Z62819 Personal history of unspecified abuse in childhood: Secondary | ICD-10-CM

## 2024-05-24 DIAGNOSIS — F4312 Post-traumatic stress disorder, chronic: Secondary | ICD-10-CM

## 2024-05-28 ENCOUNTER — Ambulatory Visit (HOSPITAL_COMMUNITY): Admitting: Clinical

## 2024-06-10 ENCOUNTER — Ambulatory Visit: Payer: 59 | Admitting: Adult Health

## 2024-06-11 ENCOUNTER — Ambulatory Visit (HOSPITAL_COMMUNITY): Admitting: Clinical

## 2024-06-24 ENCOUNTER — Ambulatory Visit: Admitting: Primary Care

## 2024-07-02 ENCOUNTER — Ambulatory Visit (HOSPITAL_COMMUNITY): Admitting: Clinical

## 2024-07-18 ENCOUNTER — Telehealth (HOSPITAL_COMMUNITY): Admitting: Psychiatry
